# Patient Record
Sex: Female | Born: 1935 | Race: Black or African American | Hispanic: No | Smoking: Never smoker
Health system: Southern US, Community
[De-identification: ages and names within clinical notes are randomized; demographics above are authoritative.]

## PROBLEM LIST (undated history)

## (undated) DIAGNOSIS — Z803 Family history of malignant neoplasm of breast: Secondary | ICD-10-CM

## (undated) DIAGNOSIS — N183 Chronic kidney disease, stage 3 unspecified: Secondary | ICD-10-CM

## (undated) DIAGNOSIS — Z9221 Personal history of antineoplastic chemotherapy: Secondary | ICD-10-CM

## (undated) DIAGNOSIS — I129 Hypertensive chronic kidney disease with stage 1 through stage 4 chronic kidney disease, or unspecified chronic kidney disease: Secondary | ICD-10-CM

## (undated) DIAGNOSIS — I10 Essential (primary) hypertension: Secondary | ICD-10-CM

## (undated) DIAGNOSIS — F32A Depression, unspecified: Secondary | ICD-10-CM

## (undated) DIAGNOSIS — T451X5A Adverse effect of antineoplastic and immunosuppressive drugs, initial encounter: Secondary | ICD-10-CM

## (undated) DIAGNOSIS — E119 Type 2 diabetes mellitus without complications: Secondary | ICD-10-CM

## (undated) DIAGNOSIS — C50919 Malignant neoplasm of unspecified site of unspecified female breast: Secondary | ICD-10-CM

## (undated) DIAGNOSIS — T7840XA Allergy, unspecified, initial encounter: Secondary | ICD-10-CM

## (undated) DIAGNOSIS — C801 Malignant (primary) neoplasm, unspecified: Secondary | ICD-10-CM

## (undated) DIAGNOSIS — K219 Gastro-esophageal reflux disease without esophagitis: Secondary | ICD-10-CM

## (undated) DIAGNOSIS — M316 Other giant cell arteritis: Secondary | ICD-10-CM

## (undated) DIAGNOSIS — M1990 Unspecified osteoarthritis, unspecified site: Secondary | ICD-10-CM

## (undated) DIAGNOSIS — Z800 Family history of malignant neoplasm of digestive organs: Secondary | ICD-10-CM

## (undated) DIAGNOSIS — F419 Anxiety disorder, unspecified: Secondary | ICD-10-CM

## (undated) DIAGNOSIS — D6481 Anemia due to antineoplastic chemotherapy: Secondary | ICD-10-CM

## (undated) HISTORY — DX: Gastro-esophageal reflux disease without esophagitis: K21.9

## (undated) HISTORY — DX: Depression, unspecified: F32.A

## (undated) HISTORY — PX: ABDOMINAL HYSTERECTOMY: SHX81

## (undated) HISTORY — PX: BREAST SURGERY: SHX581

## (undated) HISTORY — DX: Chronic kidney disease, stage 3 unspecified: N18.30

## (undated) HISTORY — DX: Family history of malignant neoplasm of breast: Z80.3

## (undated) HISTORY — DX: Other giant cell arteritis: M31.6

## (undated) HISTORY — DX: Anxiety disorder, unspecified: F41.9

## (undated) HISTORY — DX: Type 2 diabetes mellitus without complications: E11.9

## (undated) HISTORY — DX: Hypertensive chronic kidney disease with stage 1 through stage 4 chronic kidney disease, or unspecified chronic kidney disease: I12.9

## (undated) HISTORY — DX: Family history of malignant neoplasm of digestive organs: Z80.0

## (undated) HISTORY — DX: Unspecified osteoarthritis, unspecified site: M19.90

## (undated) HISTORY — DX: Essential (primary) hypertension: I10

## (undated) HISTORY — DX: Allergy, unspecified, initial encounter: T78.40XA

---

## 1988-07-12 DIAGNOSIS — C50919 Malignant neoplasm of unspecified site of unspecified female breast: Secondary | ICD-10-CM

## 1988-07-12 DIAGNOSIS — C801 Malignant (primary) neoplasm, unspecified: Secondary | ICD-10-CM

## 1988-07-12 HISTORY — DX: Malignant neoplasm of unspecified site of unspecified female breast: C50.919

## 1988-07-12 HISTORY — DX: Malignant (primary) neoplasm, unspecified: C80.1

## 1988-07-12 HISTORY — PX: MASTECTOMY: SHX3

## 2004-06-08 ENCOUNTER — Ambulatory Visit: Payer: Self-pay | Admitting: Family Medicine

## 2005-08-20 ENCOUNTER — Ambulatory Visit: Payer: Self-pay

## 2011-05-28 ENCOUNTER — Ambulatory Visit: Payer: Self-pay | Admitting: Unknown Physician Specialty

## 2011-05-31 LAB — PATHOLOGY REPORT

## 2011-06-16 ENCOUNTER — Ambulatory Visit: Payer: Self-pay | Admitting: Surgery

## 2011-06-17 LAB — CEA: CEA: 25.6 ng/mL — ABNORMAL HIGH (ref 0.0–4.7)

## 2011-06-23 ENCOUNTER — Inpatient Hospital Stay: Payer: Self-pay | Admitting: Surgery

## 2011-06-24 LAB — PATHOLOGY REPORT

## 2011-07-02 ENCOUNTER — Ambulatory Visit: Payer: Self-pay | Admitting: Internal Medicine

## 2011-07-07 ENCOUNTER — Ambulatory Visit: Payer: Self-pay | Admitting: Internal Medicine

## 2011-07-08 LAB — CEA: CEA: 9.5 ng/mL — ABNORMAL HIGH (ref 0.0–4.7)

## 2011-07-13 ENCOUNTER — Ambulatory Visit: Payer: Self-pay | Admitting: Internal Medicine

## 2011-11-04 ENCOUNTER — Ambulatory Visit: Payer: Self-pay | Admitting: Oncology

## 2011-11-04 DIAGNOSIS — Z85038 Personal history of other malignant neoplasm of large intestine: Secondary | ICD-10-CM | POA: Diagnosis not present

## 2011-11-04 DIAGNOSIS — Z79899 Other long term (current) drug therapy: Secondary | ICD-10-CM | POA: Diagnosis not present

## 2011-11-04 DIAGNOSIS — Z7982 Long term (current) use of aspirin: Secondary | ICD-10-CM | POA: Diagnosis not present

## 2011-11-04 DIAGNOSIS — I10 Essential (primary) hypertension: Secondary | ICD-10-CM | POA: Diagnosis not present

## 2011-11-04 DIAGNOSIS — Z9049 Acquired absence of other specified parts of digestive tract: Secondary | ICD-10-CM | POA: Diagnosis not present

## 2011-11-04 LAB — COMPREHENSIVE METABOLIC PANEL
Albumin: 3.6 g/dL (ref 3.4–5.0)
Alkaline Phosphatase: 98 U/L (ref 50–136)
Anion Gap: 12 (ref 7–16)
BUN: 17 mg/dL (ref 7–18)
Bilirubin,Total: 0.2 mg/dL (ref 0.2–1.0)
Calcium, Total: 9.3 mg/dL (ref 8.5–10.1)
Chloride: 101 mmol/L (ref 98–107)
Co2: 29 mmol/L (ref 21–32)
Creatinine: 1.07 mg/dL (ref 0.60–1.30)
EGFR (African American): 58 — ABNORMAL LOW
EGFR (Non-African Amer.): 50 — ABNORMAL LOW
Glucose: 135 mg/dL — ABNORMAL HIGH (ref 65–99)
Osmolality: 287 (ref 275–301)
Potassium: 3.3 mmol/L — ABNORMAL LOW (ref 3.5–5.1)
SGOT(AST): 16 U/L (ref 15–37)
SGPT (ALT): 23 U/L
Sodium: 142 mmol/L (ref 136–145)
Total Protein: 7.5 g/dL (ref 6.4–8.2)

## 2011-11-04 LAB — CBC CANCER CENTER
Basophil #: 0 x10 3/mm (ref 0.0–0.1)
Basophil %: 0.6 %
Eosinophil #: 0.1 x10 3/mm (ref 0.0–0.7)
Eosinophil %: 2.3 %
HCT: 40.3 % (ref 35.0–47.0)
HGB: 12.5 g/dL (ref 12.0–16.0)
Lymphocyte #: 1.4 x10 3/mm (ref 1.0–3.6)
Lymphocyte %: 26.4 %
MCH: 25.8 pg — ABNORMAL LOW (ref 26.0–34.0)
MCHC: 30.9 g/dL — ABNORMAL LOW (ref 32.0–36.0)
MCV: 84 fL (ref 80–100)
Monocyte #: 0.4 x10 3/mm (ref 0.2–0.9)
Monocyte %: 8.6 %
Neutrophil #: 3.2 x10 3/mm (ref 1.4–6.5)
Neutrophil %: 62.1 %
Platelet: 207 x10 3/mm (ref 150–440)
RBC: 4.82 10*6/uL (ref 3.80–5.20)
RDW: 14.8 % — ABNORMAL HIGH (ref 11.5–14.5)
WBC: 5.2 x10 3/mm (ref 3.6–11.0)

## 2011-11-05 LAB — CEA: CEA: 1.6 ng/mL (ref 0.0–4.7)

## 2011-11-10 ENCOUNTER — Ambulatory Visit: Payer: Self-pay | Admitting: Oncology

## 2011-12-07 DIAGNOSIS — Z9010 Acquired absence of unspecified breast and nipple: Secondary | ICD-10-CM | POA: Diagnosis not present

## 2011-12-07 DIAGNOSIS — Z09 Encounter for follow-up examination after completed treatment for conditions other than malignant neoplasm: Secondary | ICD-10-CM | POA: Diagnosis not present

## 2011-12-07 DIAGNOSIS — Z853 Personal history of malignant neoplasm of breast: Secondary | ICD-10-CM | POA: Diagnosis not present

## 2012-01-07 DIAGNOSIS — Z85038 Personal history of other malignant neoplasm of large intestine: Secondary | ICD-10-CM | POA: Diagnosis not present

## 2012-03-06 ENCOUNTER — Ambulatory Visit: Payer: Self-pay | Admitting: Oncology

## 2012-03-06 DIAGNOSIS — Z9049 Acquired absence of other specified parts of digestive tract: Secondary | ICD-10-CM | POA: Diagnosis not present

## 2012-03-06 DIAGNOSIS — Z85038 Personal history of other malignant neoplasm of large intestine: Secondary | ICD-10-CM | POA: Diagnosis not present

## 2012-03-06 DIAGNOSIS — Z79899 Other long term (current) drug therapy: Secondary | ICD-10-CM | POA: Diagnosis not present

## 2012-03-06 DIAGNOSIS — I10 Essential (primary) hypertension: Secondary | ICD-10-CM | POA: Diagnosis not present

## 2012-03-06 DIAGNOSIS — Z7982 Long term (current) use of aspirin: Secondary | ICD-10-CM | POA: Diagnosis not present

## 2012-03-06 LAB — CBC CANCER CENTER
Basophil #: 0 x10 3/mm (ref 0.0–0.1)
Basophil %: 0.5 %
Eosinophil #: 0.2 x10 3/mm (ref 0.0–0.7)
Eosinophil %: 3.4 %
HCT: 40.1 % (ref 35.0–47.0)
HGB: 12.6 g/dL (ref 12.0–16.0)
Lymphocyte #: 1.4 x10 3/mm (ref 1.0–3.6)
Lymphocyte %: 22.1 %
MCH: 26.6 pg (ref 26.0–34.0)
MCHC: 31.4 g/dL — ABNORMAL LOW (ref 32.0–36.0)
MCV: 85 fL (ref 80–100)
Monocyte #: 0.7 x10 3/mm (ref 0.2–0.9)
Monocyte %: 10.6 %
Neutrophil #: 4 x10 3/mm (ref 1.4–6.5)
Neutrophil %: 63.4 %
Platelet: 233 x10 3/mm (ref 150–440)
RBC: 4.74 10*6/uL (ref 3.80–5.20)
RDW: 14.4 % (ref 11.5–14.5)
WBC: 6.3 x10 3/mm (ref 3.6–11.0)

## 2012-03-07 LAB — CEA: CEA: 1.7 ng/mL (ref 0.0–4.7)

## 2012-03-12 ENCOUNTER — Ambulatory Visit: Payer: Self-pay | Admitting: Oncology

## 2012-03-29 DIAGNOSIS — Z23 Encounter for immunization: Secondary | ICD-10-CM | POA: Diagnosis not present

## 2012-03-29 DIAGNOSIS — I10 Essential (primary) hypertension: Secondary | ICD-10-CM | POA: Diagnosis not present

## 2012-03-29 DIAGNOSIS — Z0000 Encounter for general adult medical examination without abnormal findings: Secondary | ICD-10-CM | POA: Diagnosis not present

## 2012-04-11 ENCOUNTER — Ambulatory Visit: Payer: Self-pay | Admitting: Unknown Physician Specialty

## 2012-04-11 DIAGNOSIS — Z803 Family history of malignant neoplasm of breast: Secondary | ICD-10-CM | POA: Diagnosis not present

## 2012-04-11 DIAGNOSIS — Z980 Intestinal bypass and anastomosis status: Secondary | ICD-10-CM | POA: Diagnosis not present

## 2012-04-11 DIAGNOSIS — Z801 Family history of malignant neoplasm of trachea, bronchus and lung: Secondary | ICD-10-CM | POA: Diagnosis not present

## 2012-04-11 DIAGNOSIS — I10 Essential (primary) hypertension: Secondary | ICD-10-CM | POA: Diagnosis not present

## 2012-04-11 DIAGNOSIS — E669 Obesity, unspecified: Secondary | ICD-10-CM | POA: Diagnosis not present

## 2012-04-11 DIAGNOSIS — Z6834 Body mass index (BMI) 34.0-34.9, adult: Secondary | ICD-10-CM | POA: Diagnosis not present

## 2012-04-11 DIAGNOSIS — Z09 Encounter for follow-up examination after completed treatment for conditions other than malignant neoplasm: Secondary | ICD-10-CM | POA: Diagnosis not present

## 2012-04-11 DIAGNOSIS — K648 Other hemorrhoids: Secondary | ICD-10-CM | POA: Diagnosis not present

## 2012-04-11 DIAGNOSIS — Z7982 Long term (current) use of aspirin: Secondary | ICD-10-CM | POA: Diagnosis not present

## 2012-04-11 DIAGNOSIS — D126 Benign neoplasm of colon, unspecified: Secondary | ICD-10-CM | POA: Diagnosis not present

## 2012-04-11 DIAGNOSIS — Z79899 Other long term (current) drug therapy: Secondary | ICD-10-CM | POA: Diagnosis not present

## 2012-04-11 DIAGNOSIS — Z85038 Personal history of other malignant neoplasm of large intestine: Secondary | ICD-10-CM | POA: Diagnosis not present

## 2012-04-11 DIAGNOSIS — Z853 Personal history of malignant neoplasm of breast: Secondary | ICD-10-CM | POA: Diagnosis not present

## 2012-04-11 DIAGNOSIS — Z9109 Other allergy status, other than to drugs and biological substances: Secondary | ICD-10-CM | POA: Diagnosis not present

## 2012-04-12 LAB — PATHOLOGY REPORT

## 2012-04-18 LAB — HM COLONOSCOPY

## 2012-06-14 DIAGNOSIS — H2510 Age-related nuclear cataract, unspecified eye: Secondary | ICD-10-CM | POA: Diagnosis not present

## 2012-07-11 ENCOUNTER — Ambulatory Visit: Payer: Self-pay | Admitting: Oncology

## 2012-07-11 DIAGNOSIS — Z90710 Acquired absence of both cervix and uterus: Secondary | ICD-10-CM | POA: Diagnosis not present

## 2012-07-11 DIAGNOSIS — I10 Essential (primary) hypertension: Secondary | ICD-10-CM | POA: Diagnosis not present

## 2012-07-11 DIAGNOSIS — Z85038 Personal history of other malignant neoplasm of large intestine: Secondary | ICD-10-CM | POA: Diagnosis not present

## 2012-07-11 DIAGNOSIS — Z9010 Acquired absence of unspecified breast and nipple: Secondary | ICD-10-CM | POA: Diagnosis not present

## 2012-07-11 DIAGNOSIS — Z7982 Long term (current) use of aspirin: Secondary | ICD-10-CM | POA: Diagnosis not present

## 2012-07-11 DIAGNOSIS — Z79899 Other long term (current) drug therapy: Secondary | ICD-10-CM | POA: Diagnosis not present

## 2012-07-11 LAB — CBC CANCER CENTER
Basophil #: 0 x10 3/mm (ref 0.0–0.1)
Basophil %: 0.6 %
Eosinophil #: 0.1 x10 3/mm (ref 0.0–0.7)
Eosinophil %: 2.2 %
HCT: 40.8 % (ref 35.0–47.0)
HGB: 13.1 g/dL (ref 12.0–16.0)
Lymphocyte #: 1.6 x10 3/mm (ref 1.0–3.6)
Lymphocyte %: 25.9 %
MCH: 27.1 pg (ref 26.0–34.0)
MCHC: 32.2 g/dL (ref 32.0–36.0)
MCV: 84 fL (ref 80–100)
Monocyte #: 0.4 x10 3/mm (ref 0.2–0.9)
Monocyte %: 7 %
Neutrophil #: 3.9 x10 3/mm (ref 1.4–6.5)
Neutrophil %: 64.3 %
Platelet: 202 x10 3/mm (ref 150–440)
RBC: 4.85 10*6/uL (ref 3.80–5.20)
RDW: 14.4 % (ref 11.5–14.5)
WBC: 6.1 x10 3/mm (ref 3.6–11.0)

## 2012-07-12 ENCOUNTER — Ambulatory Visit: Payer: Self-pay | Admitting: Oncology

## 2012-07-13 LAB — CEA: CEA: 1.7 ng/mL (ref 0.0–4.7)

## 2012-09-27 DIAGNOSIS — I10 Essential (primary) hypertension: Secondary | ICD-10-CM | POA: Diagnosis not present

## 2012-12-07 DIAGNOSIS — Z853 Personal history of malignant neoplasm of breast: Secondary | ICD-10-CM | POA: Diagnosis not present

## 2012-12-07 DIAGNOSIS — Z9010 Acquired absence of unspecified breast and nipple: Secondary | ICD-10-CM | POA: Diagnosis not present

## 2012-12-07 DIAGNOSIS — Z09 Encounter for follow-up examination after completed treatment for conditions other than malignant neoplasm: Secondary | ICD-10-CM | POA: Diagnosis not present

## 2013-01-04 ENCOUNTER — Ambulatory Visit: Payer: Self-pay | Admitting: Oncology

## 2013-01-04 DIAGNOSIS — Z85038 Personal history of other malignant neoplasm of large intestine: Secondary | ICD-10-CM | POA: Diagnosis not present

## 2013-01-04 DIAGNOSIS — I10 Essential (primary) hypertension: Secondary | ICD-10-CM | POA: Diagnosis not present

## 2013-01-04 DIAGNOSIS — Z9049 Acquired absence of other specified parts of digestive tract: Secondary | ICD-10-CM | POA: Diagnosis not present

## 2013-01-04 DIAGNOSIS — Z79899 Other long term (current) drug therapy: Secondary | ICD-10-CM | POA: Diagnosis not present

## 2013-01-08 DIAGNOSIS — I10 Essential (primary) hypertension: Secondary | ICD-10-CM | POA: Diagnosis not present

## 2013-01-08 DIAGNOSIS — Z9049 Acquired absence of other specified parts of digestive tract: Secondary | ICD-10-CM | POA: Diagnosis not present

## 2013-01-08 DIAGNOSIS — Z79899 Other long term (current) drug therapy: Secondary | ICD-10-CM | POA: Diagnosis not present

## 2013-01-08 DIAGNOSIS — Z85038 Personal history of other malignant neoplasm of large intestine: Secondary | ICD-10-CM | POA: Diagnosis not present

## 2013-01-08 LAB — CBC CANCER CENTER
Basophil #: 0 x10 3/mm (ref 0.0–0.1)
Basophil %: 0.5 %
Eosinophil #: 0.1 x10 3/mm (ref 0.0–0.7)
Eosinophil %: 2.2 %
HCT: 39.7 % (ref 35.0–47.0)
HGB: 13 g/dL (ref 12.0–16.0)
Lymphocyte #: 1.7 x10 3/mm (ref 1.0–3.6)
Lymphocyte %: 27.6 %
MCH: 27.5 pg (ref 26.0–34.0)
MCHC: 32.7 g/dL (ref 32.0–36.0)
MCV: 84 fL (ref 80–100)
Monocyte #: 0.6 x10 3/mm (ref 0.2–0.9)
Monocyte %: 9.5 %
Neutrophil #: 3.8 x10 3/mm (ref 1.4–6.5)
Neutrophil %: 60.2 %
Platelet: 213 x10 3/mm (ref 150–440)
RBC: 4.73 10*6/uL (ref 3.80–5.20)
RDW: 14.2 % (ref 11.5–14.5)
WBC: 6.3 x10 3/mm (ref 3.6–11.0)

## 2013-01-09 ENCOUNTER — Ambulatory Visit: Payer: Self-pay | Admitting: Oncology

## 2013-01-09 LAB — CEA: CEA: 1.8 ng/mL (ref 0.0–4.7)

## 2013-04-04 DIAGNOSIS — I10 Essential (primary) hypertension: Secondary | ICD-10-CM | POA: Diagnosis not present

## 2013-04-04 DIAGNOSIS — Z23 Encounter for immunization: Secondary | ICD-10-CM | POA: Diagnosis not present

## 2013-04-04 DIAGNOSIS — Z0000 Encounter for general adult medical examination without abnormal findings: Secondary | ICD-10-CM | POA: Diagnosis not present

## 2013-07-16 ENCOUNTER — Ambulatory Visit: Payer: Self-pay | Admitting: Oncology

## 2013-07-16 DIAGNOSIS — I10 Essential (primary) hypertension: Secondary | ICD-10-CM | POA: Diagnosis not present

## 2013-07-16 DIAGNOSIS — Z7982 Long term (current) use of aspirin: Secondary | ICD-10-CM | POA: Diagnosis not present

## 2013-07-16 DIAGNOSIS — Z85038 Personal history of other malignant neoplasm of large intestine: Secondary | ICD-10-CM | POA: Diagnosis not present

## 2013-07-16 DIAGNOSIS — Z79899 Other long term (current) drug therapy: Secondary | ICD-10-CM | POA: Diagnosis not present

## 2013-07-16 LAB — CBC CANCER CENTER
Basophil #: 0 x10 3/mm (ref 0.0–0.1)
Basophil %: 0.7 %
Eosinophil #: 0.2 x10 3/mm (ref 0.0–0.7)
Eosinophil %: 2.6 %
HCT: 41.6 % (ref 35.0–47.0)
HGB: 13 g/dL (ref 12.0–16.0)
Lymphocyte #: 1.5 x10 3/mm (ref 1.0–3.6)
Lymphocyte %: 25.2 %
MCH: 26.8 pg (ref 26.0–34.0)
MCHC: 31.4 g/dL — ABNORMAL LOW (ref 32.0–36.0)
MCV: 85 fL (ref 80–100)
Monocyte #: 0.5 x10 3/mm (ref 0.2–0.9)
Monocyte %: 9 %
Neutrophil #: 3.8 x10 3/mm (ref 1.4–6.5)
Neutrophil %: 62.5 %
Platelet: 212 x10 3/mm (ref 150–440)
RBC: 4.87 10*6/uL (ref 3.80–5.20)
RDW: 13.9 % (ref 11.5–14.5)
WBC: 6 x10 3/mm (ref 3.6–11.0)

## 2013-07-17 LAB — CEA: CEA: 1.7 ng/mL (ref 0.0–4.7)

## 2013-08-12 ENCOUNTER — Ambulatory Visit: Payer: Self-pay | Admitting: Oncology

## 2013-10-02 DIAGNOSIS — I10 Essential (primary) hypertension: Secondary | ICD-10-CM | POA: Diagnosis not present

## 2013-12-05 ENCOUNTER — Ambulatory Visit: Payer: Self-pay

## 2013-12-05 DIAGNOSIS — Z1231 Encounter for screening mammogram for malignant neoplasm of breast: Secondary | ICD-10-CM | POA: Diagnosis not present

## 2013-12-08 LAB — HM MAMMOGRAPHY

## 2014-02-01 DIAGNOSIS — H2510 Age-related nuclear cataract, unspecified eye: Secondary | ICD-10-CM | POA: Diagnosis not present

## 2014-02-04 ENCOUNTER — Ambulatory Visit: Payer: Self-pay | Admitting: Oncology

## 2014-02-04 DIAGNOSIS — I10 Essential (primary) hypertension: Secondary | ICD-10-CM | POA: Diagnosis not present

## 2014-02-04 DIAGNOSIS — Z7982 Long term (current) use of aspirin: Secondary | ICD-10-CM | POA: Diagnosis not present

## 2014-02-04 DIAGNOSIS — Z9010 Acquired absence of unspecified breast and nipple: Secondary | ICD-10-CM | POA: Diagnosis not present

## 2014-02-04 DIAGNOSIS — Z85038 Personal history of other malignant neoplasm of large intestine: Secondary | ICD-10-CM | POA: Diagnosis not present

## 2014-02-04 DIAGNOSIS — Z90710 Acquired absence of both cervix and uterus: Secondary | ICD-10-CM | POA: Diagnosis not present

## 2014-02-04 DIAGNOSIS — Z79899 Other long term (current) drug therapy: Secondary | ICD-10-CM | POA: Diagnosis not present

## 2014-02-04 LAB — CBC CANCER CENTER
Basophil #: 0 x10 3/mm (ref 0.0–0.1)
Basophil %: 0.5 %
Eosinophil #: 0.1 x10 3/mm (ref 0.0–0.7)
Eosinophil %: 2.3 %
HCT: 39.5 % (ref 35.0–47.0)
HGB: 12.5 g/dL (ref 12.0–16.0)
Lymphocyte #: 1.6 x10 3/mm (ref 1.0–3.6)
Lymphocyte %: 27.5 %
MCH: 27 pg (ref 26.0–34.0)
MCHC: 31.7 g/dL — ABNORMAL LOW (ref 32.0–36.0)
MCV: 85 fL (ref 80–100)
Monocyte #: 0.5 x10 3/mm (ref 0.2–0.9)
Monocyte %: 8.7 %
Neutrophil #: 3.5 x10 3/mm (ref 1.4–6.5)
Neutrophil %: 61 %
Platelet: 210 x10 3/mm (ref 150–440)
RBC: 4.64 10*6/uL (ref 3.80–5.20)
RDW: 14.2 % (ref 11.5–14.5)
WBC: 5.8 x10 3/mm (ref 3.6–11.0)

## 2014-02-05 LAB — CEA: CEA: 1.8 ng/mL (ref 0.0–4.7)

## 2014-02-09 ENCOUNTER — Ambulatory Visit: Payer: Self-pay | Admitting: Oncology

## 2014-04-10 DIAGNOSIS — Z23 Encounter for immunization: Secondary | ICD-10-CM | POA: Diagnosis not present

## 2014-04-10 DIAGNOSIS — N183 Chronic kidney disease, stage 3 unspecified: Secondary | ICD-10-CM | POA: Diagnosis not present

## 2014-04-10 DIAGNOSIS — I129 Hypertensive chronic kidney disease with stage 1 through stage 4 chronic kidney disease, or unspecified chronic kidney disease: Secondary | ICD-10-CM | POA: Diagnosis not present

## 2014-04-10 DIAGNOSIS — Z0000 Encounter for general adult medical examination without abnormal findings: Secondary | ICD-10-CM | POA: Diagnosis not present

## 2014-05-02 ENCOUNTER — Ambulatory Visit: Payer: Self-pay

## 2014-05-02 DIAGNOSIS — Z13820 Encounter for screening for osteoporosis: Secondary | ICD-10-CM | POA: Diagnosis not present

## 2014-05-02 DIAGNOSIS — N951 Menopausal and female climacteric states: Secondary | ICD-10-CM | POA: Diagnosis not present

## 2014-05-02 DIAGNOSIS — Z780 Asymptomatic menopausal state: Secondary | ICD-10-CM | POA: Diagnosis not present

## 2014-05-02 LAB — HM DEXA SCAN

## 2014-05-07 DIAGNOSIS — E876 Hypokalemia: Secondary | ICD-10-CM | POA: Diagnosis not present

## 2014-05-07 DIAGNOSIS — I129 Hypertensive chronic kidney disease with stage 1 through stage 4 chronic kidney disease, or unspecified chronic kidney disease: Secondary | ICD-10-CM | POA: Diagnosis not present

## 2014-07-31 DIAGNOSIS — H2512 Age-related nuclear cataract, left eye: Secondary | ICD-10-CM | POA: Diagnosis not present

## 2014-08-08 ENCOUNTER — Ambulatory Visit: Payer: Self-pay | Admitting: Oncology

## 2014-08-08 DIAGNOSIS — Z7982 Long term (current) use of aspirin: Secondary | ICD-10-CM | POA: Diagnosis not present

## 2014-08-08 DIAGNOSIS — Z9049 Acquired absence of other specified parts of digestive tract: Secondary | ICD-10-CM | POA: Diagnosis not present

## 2014-08-08 DIAGNOSIS — Z9010 Acquired absence of unspecified breast and nipple: Secondary | ICD-10-CM | POA: Diagnosis not present

## 2014-08-08 DIAGNOSIS — Z85038 Personal history of other malignant neoplasm of large intestine: Secondary | ICD-10-CM | POA: Diagnosis not present

## 2014-08-08 DIAGNOSIS — Z90710 Acquired absence of both cervix and uterus: Secondary | ICD-10-CM | POA: Diagnosis not present

## 2014-08-08 DIAGNOSIS — I10 Essential (primary) hypertension: Secondary | ICD-10-CM | POA: Diagnosis not present

## 2014-08-08 DIAGNOSIS — Z79899 Other long term (current) drug therapy: Secondary | ICD-10-CM | POA: Diagnosis not present

## 2014-08-08 LAB — CBC CANCER CENTER
Basophil #: 0 x10 3/mm (ref 0.0–0.1)
Basophil %: 0.5 %
Eosinophil #: 0.1 x10 3/mm (ref 0.0–0.7)
Eosinophil %: 1.7 %
HCT: 42 % (ref 35.0–47.0)
HGB: 13.3 g/dL (ref 12.0–16.0)
Lymphocyte #: 1.4 x10 3/mm (ref 1.0–3.6)
Lymphocyte %: 25.4 %
MCH: 26.6 pg (ref 26.0–34.0)
MCHC: 31.5 g/dL — ABNORMAL LOW (ref 32.0–36.0)
MCV: 84 fL (ref 80–100)
Monocyte #: 0.5 x10 3/mm (ref 0.2–0.9)
Monocyte %: 8.1 %
Neutrophil #: 3.6 x10 3/mm (ref 1.4–6.5)
Neutrophil %: 64.3 %
Platelet: 207 x10 3/mm (ref 150–440)
RBC: 4.99 10*6/uL (ref 3.80–5.20)
RDW: 14.2 % (ref 11.5–14.5)
WBC: 5.6 x10 3/mm (ref 3.6–11.0)

## 2014-08-09 LAB — CEA: CEA: 1.7 ng/mL (ref 0.0–4.7)

## 2014-08-12 ENCOUNTER — Ambulatory Visit: Payer: Self-pay | Admitting: Oncology

## 2014-08-15 ENCOUNTER — Ambulatory Visit: Payer: Self-pay | Admitting: Ophthalmology

## 2014-08-15 DIAGNOSIS — H2512 Age-related nuclear cataract, left eye: Secondary | ICD-10-CM | POA: Diagnosis not present

## 2014-08-15 DIAGNOSIS — H269 Unspecified cataract: Secondary | ICD-10-CM | POA: Diagnosis not present

## 2014-08-15 DIAGNOSIS — Z01810 Encounter for preprocedural cardiovascular examination: Secondary | ICD-10-CM | POA: Diagnosis not present

## 2014-08-20 ENCOUNTER — Ambulatory Visit: Payer: Self-pay | Admitting: Ophthalmology

## 2014-08-20 DIAGNOSIS — H269 Unspecified cataract: Secondary | ICD-10-CM | POA: Diagnosis not present

## 2014-08-20 DIAGNOSIS — E669 Obesity, unspecified: Secondary | ICD-10-CM | POA: Diagnosis not present

## 2014-08-20 DIAGNOSIS — I10 Essential (primary) hypertension: Secondary | ICD-10-CM | POA: Diagnosis not present

## 2014-08-20 DIAGNOSIS — H2512 Age-related nuclear cataract, left eye: Secondary | ICD-10-CM | POA: Diagnosis not present

## 2014-09-04 DIAGNOSIS — H2511 Age-related nuclear cataract, right eye: Secondary | ICD-10-CM | POA: Diagnosis not present

## 2014-09-10 ENCOUNTER — Ambulatory Visit: Payer: Self-pay | Admitting: Ophthalmology

## 2014-09-10 DIAGNOSIS — Z85038 Personal history of other malignant neoplasm of large intestine: Secondary | ICD-10-CM | POA: Diagnosis not present

## 2014-09-10 DIAGNOSIS — I10 Essential (primary) hypertension: Secondary | ICD-10-CM | POA: Diagnosis not present

## 2014-09-10 DIAGNOSIS — H2511 Age-related nuclear cataract, right eye: Secondary | ICD-10-CM | POA: Diagnosis not present

## 2014-09-10 DIAGNOSIS — Z7982 Long term (current) use of aspirin: Secondary | ICD-10-CM | POA: Diagnosis not present

## 2014-09-10 DIAGNOSIS — H269 Unspecified cataract: Secondary | ICD-10-CM | POA: Diagnosis not present

## 2014-09-10 DIAGNOSIS — Z853 Personal history of malignant neoplasm of breast: Secondary | ICD-10-CM | POA: Diagnosis not present

## 2014-10-09 DIAGNOSIS — I129 Hypertensive chronic kidney disease with stage 1 through stage 4 chronic kidney disease, or unspecified chronic kidney disease: Secondary | ICD-10-CM | POA: Diagnosis not present

## 2014-10-09 DIAGNOSIS — N183 Chronic kidney disease, stage 3 (moderate): Secondary | ICD-10-CM | POA: Diagnosis not present

## 2014-11-03 NOTE — Discharge Summary (Signed)
PATIENT NAME:  Veronica Colon, Veronica Colon MR#:  [id number] DATE OF BIRTH:  [birth year]  DATE OF ADMISSION:  06/23/2011 DATE OF DISCHARGE:  06/27/2011  ADMITTING PHYSICIAN: Rochel Brome, MD  ADMISSION DIAGNOSES: Carcinoma of the ascending colon and adenoma of the cecum.   DISCHARGE DIAGNOSES: Carcinoma of the ascending colon and adenoma of the cecum.   HISTORY OF PRESENT ILLNESS: 81 year old female with findings of a tubular adenoma of the cecum and carcinoma of the ascending colon found on a screening colonoscopy recently. Surgery was recommended for definitive treatment.   HOSPITAL COURSE: The patient underwent laparoscopic right colectomy on 12/12. She did well with the surgery and was admitted to the floor after recovery. She used very little IV pain medication. She was able to transition to oral pain medication by postoperative day number two. She was able to start clear liquids on postoperative day number one and gradually advanced her diet. On postoperative day number four she had had multiple bowel movements and was passing gas. She was ambulating well. Dr. Tamala Julian examined the patient. Her abdomen was soft and the incision was dry and intact. Discharge plan was discussed with the patient.   DISPOSITION: Discharged home with self-care in satisfactory condition.   MEDICATIONS:  1. Hydrochlorothiazide 25 mg daily.  2. Amlodipine 2.5 mg daily.  3. Aspirin 81 mg daily.  4. Over-the-counter allergy pills daily. 5. Klor-Con milliequivalents daily.  6. Tylenol as needed for pain.   DISCHARGE INSTRUCTIONS:  1. No dressings needed.  2. May shower.  3. Regular diet.  4. No heavy lifting. 5. Follow-up is scheduled later in the week for suture removal.   ____________________________ Celene Squibb. Theda Sers, [place] amc:rbg D: 07/14/2011 12:57:02 ET T: 07/15/2011 13:46:26 ET JOB#: [id number]  cc: Celene Squibb. Theda Sers, [place], <Dictator> ANN M COLLINS PA ELECTRONICALLY SIGNED 07/15/2011 15:22

## 2014-11-03 NOTE — Op Note (Signed)
PATIENT NAME:  Veronica Colon, Veronica Colon MR#:  [id number] DATE OF BIRTH:  21-Apr-1932  DATE OF PROCEDURE:  06/23/2011  PREOPERATIVE DIAGNOSES:  1. Carcinoma of the ascending colon.  2. Adenoma of the cecum.   POSTOPERATIVE DIAGNOSES: 1. Carcinoma of the ascending colon.  2. Adenoma of the cecum.   PROCEDURE: Laparoscopic right colectomy.   SURGEON: Loreli Dollar, MD   ASSISTANT: Britta Mccreedy, PA    ANESTHESIA: General.   INDICATIONS: This 81 year old female recently had a screening colonoscopy with findings of a sessile tubular adenoma of the cecum and a 2.5 cm mass of the ascending colon which biopsy demonstrated invasive adenocarcinoma. Surgery was recommended for definitive treatment.   DESCRIPTION OF PROCEDURE: The patient was placed on the operating table in the supine position under general anesthesia. The abdomen was prepared with ChloraPrep and draped in a sterile manner.   A short incision was made below the umbilicus and carried down through the subcutaneous tissues to the deep fascia which was grasped with laryngeal hook and elevated. A Veress needle was inserted, aspirated, and irrigated with a saline solution. Next, the peritoneal cavity was inflated with carbon dioxide. The Veress needle was removed. The 10 mm cannula was inserted. The 10 mm 0 degree laparoscope was inserted to view the peritoneal cavity. Another incision was made in the epigastrium just about two inches above the umbilicus to insert another 10 mm port. Another incision was made in the right mid abdomen to introduce another 10 mm port.   The liver appeared normal. There were a number of adhesions between the omentum and the anterior abdominal wall below the umbilicus. The site of the right colon was identified. Next, a number of adhesions were taken down between the omentum and the anterior abdominal wall using the Harmonic scalpel. Next, the omentum was maneuvered up and reflected in a cephalad direction and laid over the  stomach exposing the transverse colon. Next, Babcock clamp was used to grasp the cecum, further identified the appendix and the terminal ileum. There appeared to be a mild prominence of creeping fat along the terminal ileum but there appeared to be no thickening of the wall of the terminal ileum. The right colon was mobilized with incision of the lateral peritoneal reflection using the Harmonic scalpel and also the appendix and terminal ileum were mobilized in a similar manner. The mobilization was carried up to the hepatic flexure. Next, dissecting along the transverse colon the omentum was dissected away from the transverse colon and extending from the mid aspect of the transverse colon towards the right and with additional dissection the right transverse colon was mobilized. The duodenum was identified as the transverse mesocolon was dissected away from the duodenum and the dissection alternated between the right colon and the transverse colon until it was well mobilized and after that the laparoscopic instruments were removed. An incision was made from the supraumbilical port to the infraumbilical port and made just long enough to bring the colon out on the abdominal wall. Next, a window was created in the mesentery of the terminal ileum some four inches proximal to the ileocecal valve. Also, a window was created in the transverse mesocolon just to the right of the middle colic vessels. I also palpated the cancer which was about 2.5 cm in the ascending colon. There was no palpable adenopathy. Next, the mesenteric dissection was carried out beginning with the Harmonic scalpel. The ileocolic artery and vein were doubly ligated with 0 chromic suture ligature and  then divided distal to that with the Harmonic scalpel. After completing the mesenteric dissection, the terminal ileum was placed beside the transverse colon. An enterotomy and a colotomy were made. The 75 mm GIA stapler was introduced and began the  anastomosis along the antimesenteric border as it was engaged and activated. The staple line appeared to be hemostatic. Next, the anastomosis was completed with application of VB-16 stapler which was placed perpendicular to the first, engaged, activated, and the specimen was incised sharply with a scalpel. Staple line was inspected. Two small bleeding points were cauterized. The anastomosis looked good and was widely patent. Next, the mesenteric defect was closed with running 3-0 chromic.  The specimen was opened on the side table identifying the cancer which was some 2.5 cm in dimension in the ascending colon and also identified a sessile polyp in the cecum in close proximity to the ileocecal valve. The specimen was submitted in formalin for routine pathology. Gloves were changed. Next, the operative area was examined. Pulled suction was used. Just a small amount of blood was aspirated along the right colic gutter and hemostasis appeared to be intact. Next, the omentum was brought down beneath the wound and spread out over the small intestines. Next, the midline fascia was closed with interrupted 0 Maxon figure-of-eight sutures and the skin incisions were closed with interrupted 4-0 nylon vertical mattress sutures. Dressings were applied with paper tape.   The patient tolerated surgery satisfactorily and was then prepared for transfer to the recovery room.   ____________________________ Lenna Sciara. Rochel Brome, MD jws:drc D: 06/23/2011 10:51:32 ET T: 06/23/2011 11:15:14 ET JOB#: [id number]  cc: Loreli Dollar, MD, <Dictator> Loreli Dollar MD ELECTRONICALLY SIGNED 07/17/2011 17:35

## 2014-11-10 NOTE — Op Note (Signed)
PATIENT NAME:  Veronica Colon, Veronica Colon MR#:  [id number] DATE OF BIRTH:  1932/05/15  DATE OF PROCEDURE:  08/20/2014   PREOPERATIVE DIAGNOSIS:  Nuclear sclerotic cataract of the left eye.   POSTOPERATIVE DIAGNOSIS:  Nuclear sclerotic cataract of the left eye.   OPERATIVE PROCEDURE:  Cataract extraction by phacoemulsification with implant of intraocular lens to left eye.   SURGEON:  Birder Robson, MD.   ANESTHESIA:  1. Managed anesthesia care.  2. Topical tetracaine drops followed by 2% Xylocaine jelly applied in the preoperative holding area.   COMPLICATIONS:  None.   TECHNIQUE:   Stop and chop.  DESCRIPTION OF PROCEDURE:  The patient was examined and consented in the preoperative holding area where the aforementioned topical anesthesia was applied to the left eye and then brought back to the Operating Room where the left eye was prepped and draped in the usual sterile ophthalmic fashion and a lid speculum was placed. A paracentesis was created with the side port blade and the anterior chamber was filled with viscoelastic. A near clear corneal incision was performed with the steel keratome. A continuous curvilinear capsulorrhexis was performed with a cystotome followed by the capsulorrhexis forceps. Hydrodissection and hydrodelineation were carried out with BSS on a blunt cannula. The lens was removed in a stop and chop technique and the remaining cortical material was removed with the irrigation-aspiration handpiece. The capsular bag was inflated with viscoelastic and the Tecnis ZCB00, 21.0-diopter lens, serial number [id number] was placed in the capsular bag without complication. The remaining viscoelastic was removed from the eye with the irrigation-aspiration handpiece. The wounds were hydrated. The anterior chamber was flushed with Miostat and the eye was inflated to physiologic pressure. 0.1 mL of cefuroxime concentration 10 mg/mL was placed in the anterior chamber. The wounds were found to be water  tight. The eye was dressed with Vigamox. The patient was given protective glasses to wear throughout the day and a shield with which to sleep tonight. The patient was also given drops with which to begin a drop regimen today and will follow-up with me in one day.    ____________________________ Livingston Diones. Levander Katzenstein, MD wlp:TM D: 08/20/2014 21:15:52 ET T: 08/20/2014 22:57:55 ET JOB#: [id number]  cc: Larkin Morelos L. Donte Lenzo, MD, <Dictator> Livingston Diones Noe Pittsley MD ELECTRONICALLY SIGNED 08/21/2014 12:12

## 2014-11-10 NOTE — Op Note (Signed)
PATIENT NAME:  Veronica Colon, Veronica Colon MR#:  [id number] DATE OF BIRTH:  May 08, 1932  DATE OF PROCEDURE:  09/10/2014  LOCATION: [facility].   TECHNIQUE: Stop and chop.   PREOPERATIVE DIAGNOSIS:  Nuclear sclerotic cataract of the right eye.   POSTOPERATIVE DIAGNOSIS:  Nuclear sclerotic cataract of the right eye.   OPERATIVE PROCEDURE:  Cataract extraction by phacoemulsification with implant of intraocular lens to right eye.   SURGEON:  Birder Robson, MD.   ANESTHESIA:  1. Managed anesthesia care.  2. Topical tetracaine drops followed by 2% Xylocaine jelly applied in the preoperative holding area.   COMPLICATIONS:  None.   DESCRIPTION OF PROCEDURE:  The patient was examined and consented in the preoperative holding area where the aforementioned topical anesthesia was applied to the right  eye and then brought back to the Operating Room where the right eye was prepped and draped in the usual sterile ophthalmic fashion and a lid speculum was placed. A paracentesis was created with the side port blade and the anterior chamber was filled with viscoelastic. A near clear corneal incision was performed with the steel keratome. A continuous curvilinear capsulorrhexis was performed with a cystotome followed by the capsulorrhexis forceps. Hydrodissection and hydrodelineation were carried out with BSS on a blunt cannula. The lens was removed in a stop and chop technique and the remaining cortical material was removed with the irrigation-aspiration handpiece. The capsular bag was inflated with viscoelastic and the Tecnis ZCB00 diopter lens 21.0-diopter lens, serial number [id number] was placed in the capsular bag without complication. The remaining viscoelastic was removed from the eye with the irrigation-aspiration handpiece. The wounds were hydrated. The anterior chamber was flushed with Miostat and the eye was inflated to physiologic pressure. 0.1 mL of cefuroxime concentration 10 mg/mL was  placed in the anterior chamber. The wounds were found to be water tight. The eye was dressed with Vigamox. The patient was given protective glasses to wear throughout the day and a shield with which to sleep tonight. The patient was also given drops with which to begin a drop regimen today and will follow-up with me in 1 day.    ____________________________ Livingston Diones. Asyia Hornung, MD wlp:mc D: 09/10/2014 21:13:48 ET T: 09/11/2014 08:42:54 ET JOB#: [id number]  cc: Nilan Iddings L. Lovette Merta, MD, <Dictator> Livingston Diones Magalie Almon MD ELECTRONICALLY SIGNED 09/11/2014 11:26

## 2014-12-16 DIAGNOSIS — M47816 Spondylosis without myelopathy or radiculopathy, lumbar region: Secondary | ICD-10-CM | POA: Diagnosis not present

## 2014-12-16 DIAGNOSIS — M545 Low back pain: Secondary | ICD-10-CM | POA: Diagnosis not present

## 2014-12-16 DIAGNOSIS — M4316 Spondylolisthesis, lumbar region: Secondary | ICD-10-CM | POA: Diagnosis not present

## 2014-12-23 ENCOUNTER — Other Ambulatory Visit: Payer: Self-pay | Admitting: Unknown Physician Specialty

## 2014-12-23 DIAGNOSIS — Z1231 Encounter for screening mammogram for malignant neoplasm of breast: Secondary | ICD-10-CM

## 2014-12-26 ENCOUNTER — Ambulatory Visit
Admission: RE | Admit: 2014-12-26 | Discharge: 2014-12-26 | Disposition: A | Discharge status: Home / Self Care | Payer: Medicare Other | Source: Ambulatory Visit | Attending: Unknown Physician Specialty | Admitting: Unknown Physician Specialty

## 2014-12-26 ENCOUNTER — Other Ambulatory Visit: Payer: Self-pay | Admitting: Unknown Physician Specialty

## 2014-12-26 DIAGNOSIS — Z9012 Acquired absence of left breast and nipple: Secondary | ICD-10-CM | POA: Insufficient documentation

## 2014-12-26 DIAGNOSIS — Z1231 Encounter for screening mammogram for malignant neoplasm of breast: Secondary | ICD-10-CM | POA: Diagnosis not present

## 2014-12-26 HISTORY — DX: Malignant (primary) neoplasm, unspecified: C80.1

## 2014-12-26 HISTORY — DX: Personal history of antineoplastic chemotherapy: Z92.21

## 2015-02-14 DIAGNOSIS — J069 Acute upper respiratory infection, unspecified: Secondary | ICD-10-CM | POA: Diagnosis not present

## 2015-02-26 ENCOUNTER — Ambulatory Visit (INDEPENDENT_AMBULATORY_CARE_PROVIDER_SITE_OTHER): Payer: Medicare Other | Admitting: Unknown Physician Specialty

## 2015-02-26 ENCOUNTER — Encounter: Payer: Self-pay | Admitting: Unknown Physician Specialty

## 2015-02-26 VITALS — BP 134/75 | HR 66 | Temp 97.9°F | Ht 64.2 in | Wt 208.4 lb

## 2015-02-26 DIAGNOSIS — N183 Chronic kidney disease, stage 3 unspecified: Secondary | ICD-10-CM

## 2015-02-26 DIAGNOSIS — R609 Edema, unspecified: Secondary | ICD-10-CM

## 2015-02-26 DIAGNOSIS — E876 Hypokalemia: Secondary | ICD-10-CM

## 2015-02-26 DIAGNOSIS — I129 Hypertensive chronic kidney disease with stage 1 through stage 4 chronic kidney disease, or unspecified chronic kidney disease: Secondary | ICD-10-CM | POA: Insufficient documentation

## 2015-02-26 DIAGNOSIS — M25473 Effusion, unspecified ankle: Secondary | ICD-10-CM

## 2015-02-26 DIAGNOSIS — Z853 Personal history of malignant neoplasm of breast: Secondary | ICD-10-CM | POA: Insufficient documentation

## 2015-02-26 DIAGNOSIS — Z85038 Personal history of other malignant neoplasm of large intestine: Secondary | ICD-10-CM | POA: Insufficient documentation

## 2015-02-26 DIAGNOSIS — K219 Gastro-esophageal reflux disease without esophagitis: Secondary | ICD-10-CM | POA: Insufficient documentation

## 2015-02-26 DIAGNOSIS — J309 Allergic rhinitis, unspecified: Secondary | ICD-10-CM | POA: Insufficient documentation

## 2015-02-26 NOTE — Progress Notes (Signed)
   BP 134/75 mmHg  Pulse 66  Temp(Src) 97.9 F (36.6 C)  Ht 5' 4.2" (1.631 m)  Wt 208 lb 6.4 oz (94.53 kg)  BMI 35.54 kg/m2  SpO2 96%  LMP  (LMP Unknown)   Subjective:    Patient ID: Veronica Colon, female    DOB: 1931-11-24, 81 y.o.   MRN: [id number]  HPI: Veronica Colon is a 81 y.o. female  Chief Complaint  Patient presents with  . Edema    pt states she has swelling in both ankles and has for a few weeks now   Pt nis here with complaints of bilateral foot and ankle swelling.  It is resolved when she wakes up in the morning.  Of not, she is allergic to Lisinopril and I needed to stop HCTZ due to hypokalemia.  This has mostly been a problem with hot weather.  No SOB or chest pain.  Does state she gets hot flashes that make her feel "like I'm going to pass out."  This happens at rest as well.    Relevant past medical, surgical, family and social history reviewed and updated as indicated. Interim medical history since our last visit reviewed. Allergies and medications reviewed and updated.  Review of Systems  Per HPI unless specifically indicated above     Objective:    BP 134/75 mmHg  Pulse 66  Temp(Src) 97.9 F (36.6 C)  Ht 5' 4.2" (1.631 m)  Wt 208 lb 6.4 oz (94.53 kg)  BMI 35.54 kg/m2  SpO2 96%  LMP  (LMP Unknown)  Wt Readings from Last 3 Encounters:  02/26/15 208 lb 6.4 oz (94.53 kg)  10/09/14 211 lb (95.709 kg)    Physical Exam  Constitutional: She is oriented to person, place, and time. She appears well-developed and well-nourished. No distress.  HENT:  Head: Normocephalic and atraumatic.  Eyes: Conjunctivae and lids are normal. Right eye exhibits no discharge. Left eye exhibits no discharge. No scleral icterus.  Cardiovascular: Normal rate, regular rhythm and normal heart sounds.   Pulmonary/Chest: Effort normal and breath sounds normal. No respiratory distress.  Abdominal: Normal appearance and bowel sounds are normal. She exhibits no distension. There is no  splenomegaly or hepatomegaly. There is no tenderness.  Musculoskeletal: Normal range of motion.  Trace foot and ankle edema  Neurological: She is alert and oriented to person, place, and time.  Skin: Skin is intact. No rash noted. No pallor.  Psychiatric: She has a normal mood and affect. Her behavior is normal. Judgment and thought content normal.    Results for orders placed or performed in visit on 02/26/15  HM MAMMOGRAPHY  Result Value Ref Range   HM Mammogram from PP   HM DEXA SCAN  Result Value Ref Range   HM Dexa Scan from PP   HM COLONOSCOPY  Result Value Ref Range   HM Colonoscopy from PP       Assessment & Plan:   Problem List Items Addressed This Visit    None    Visit Diagnoses    Ankle edema    -  Primary    Discussed side-effect of Amlodipine.  Hypokalemia with HCTZ and allergic to Lisinopril.  Discussed options and she elected to continue Amlodipine.          Follow up plan: No Follow-up on file.

## 2015-03-24 ENCOUNTER — Other Ambulatory Visit: Payer: Self-pay

## 2015-03-24 MED ORDER — FLUTICASONE PROPIONATE 50 MCG/ACT NA SUSP: 2.0000 | Freq: Every day | NASAL | Status: DC

## 2015-03-24 NOTE — Telephone Encounter (Signed)
Patient was last seen 02/26/15 and has follow-up on 04/14/15. Pharmacy is [facility] in [place].

## 2015-04-14 ENCOUNTER — Encounter: Payer: Self-pay | Admitting: Unknown Physician Specialty

## 2015-04-14 ENCOUNTER — Ambulatory Visit (INDEPENDENT_AMBULATORY_CARE_PROVIDER_SITE_OTHER): Payer: Medicare Other | Admitting: Unknown Physician Specialty

## 2015-04-14 VITALS — BP 147/72 | HR 60 | Temp 97.8°F | Ht 63.0 in | Wt 205.2 lb

## 2015-04-14 DIAGNOSIS — Z23 Encounter for immunization: Secondary | ICD-10-CM | POA: Diagnosis not present

## 2015-04-14 DIAGNOSIS — N183 Chronic kidney disease, stage 3 unspecified: Secondary | ICD-10-CM

## 2015-04-14 DIAGNOSIS — Z0000 Encounter for general adult medical examination without abnormal findings: Secondary | ICD-10-CM

## 2015-04-14 DIAGNOSIS — I129 Hypertensive chronic kidney disease with stage 1 through stage 4 chronic kidney disease, or unspecified chronic kidney disease: Secondary | ICD-10-CM | POA: Diagnosis not present

## 2015-04-14 DIAGNOSIS — D369 Benign neoplasm, unspecified site: Secondary | ICD-10-CM

## 2015-04-14 DIAGNOSIS — I10 Essential (primary) hypertension: Secondary | ICD-10-CM | POA: Diagnosis not present

## 2015-04-14 MED ORDER — AMLODIPINE BESYLATE 5 MG PO TABS: 5.0000 mg | ORAL_TABLET | Freq: Every day | ORAL | Status: DC

## 2015-04-14 NOTE — Assessment & Plan Note (Signed)
Stable, continue present medications.  Check CMP

## 2015-04-14 NOTE — Progress Notes (Addendum)
BP 147/72 mmHg  Pulse 60  Temp(Src) 97.8 F (36.6 C)  Ht 5\' 3"  (1.6 m)  Wt 205 lb 3.2 oz (93.078 kg)  BMI 36.36 kg/m2  SpO2 97%  LMP  (LMP Unknown)   Subjective:    Patient ID: Veronica Colon, female    DOB: 07/24/1931, 81 y.o.   MRN: [id number]  HPI: Veronica Colon is a 81 y.o. female  Chief Complaint  Patient presents with  . Medicare Wellness   Depression screen [facility] 2/9 04/14/2015  Decreased Interest 0  Down, Depressed, Hopeless 0  PHQ - 2 Score 0    Fall Risk  04/14/2015  Falls in the past year? No   Functional Status Survey: Is the patient deaf or have difficulty hearing?: No Does the patient have difficulty seeing, even when wearing glasses/contacts?: No Does the patient have difficulty concentrating, remembering, or making decisions?: No Does the patient have difficulty walking or climbing stairs?: No Does the patient have difficulty dressing or bathing?: No Does the patient have difficulty doing errands alone such as visiting a doctor's office or shopping?: No  Relevant past medical, surgical, family and social history reviewed and updated as indicated. Interim medical history since our last visit reviewed. Allergies and medications reviewed and updated.  See functional status, depression screen, and fall's risk assessment  under the appropriate section.    Pt is able to perform complex mental tasks, recognize clock face, recognize time and do a 3 item recall.    Sees eye professional yearly  Hypertension This is a chronic problem. The current episode started today. The problem is unchanged. The problem is controlled. Pertinent negatives include no anxiety, blurred vision, chest pain, headaches, malaise/fatigue, neck pain, orthopnea, palpitations, peripheral edema, PND, shortness of breath or sweats. There are no compliance problems.      Relevant past medical, surgical, family and social history reviewed and updated as indicated. Interim medical history since  our last visit reviewed. Allergies and medications reviewed and updated.  Review of Systems  Constitutional: Negative for malaise/fatigue.  Eyes: Negative for blurred vision.  Respiratory: Negative for shortness of breath.   Cardiovascular: Negative for chest pain, palpitations, orthopnea and PND.  Musculoskeletal: Negative for neck pain.  Neurological: Negative for headaches.    Per HPI unless specifically indicated above     Objective:    BP 147/72 mmHg  Pulse 60  Temp(Src) 97.8 F (36.6 C)  Ht 5\' 3"  (1.6 m)  Wt 205 lb 3.2 oz (93.078 kg)  BMI 36.36 kg/m2  SpO2 97%  LMP  (LMP Unknown)  Wt Readings from Last 3 Encounters:  04/14/15 205 lb 3.2 oz (93.078 kg)  02/26/15 208 lb 6.4 oz (94.53 kg)  10/09/14 211 lb (95.709 kg)    Physical Exam  Constitutional: She is oriented to person, place, and time. She appears well-developed and well-nourished. No distress.  HENT:  Head: Normocephalic and atraumatic.  Eyes: Conjunctivae and lids are normal. Right eye exhibits no discharge. Left eye exhibits no discharge. No scleral icterus.  Neck: Normal range of motion. Neck supple.  Cardiovascular: Normal rate, regular rhythm and normal heart sounds.   Pulmonary/Chest: Effort normal and breath sounds normal. No respiratory distress.  Abdominal: Normal appearance. There is no splenomegaly or hepatomegaly.  Musculoskeletal: Normal range of motion.  Neurological: She is alert and oriented to person, place, and time.  Skin: Skin is warm, dry and intact. No rash noted. No pallor.  Psychiatric: She has a normal mood and  affect. Her behavior is normal. Judgment and thought content normal.  Nursing note and vitals reviewed.      Assessment & Plan:   Problem List Items Addressed This Visit      Unprioritized   Hypertensive CKD (chronic kidney disease)    Stable, continue present medications.  Check CMP      CKD (chronic kidney disease), stage III    Other Visit Diagnoses     Immunization due    -  Primary    Relevant Orders    Flu Vaccine QUAD 36+ mos PF IM (Fluarix & Fluzone Quad PF) (Completed)    Tdap vaccine greater than or equal to 7yo IM (Completed)    Adenomatous polyps        Relevant Orders    Ambulatory referral to Gastroenterology    Essential hypertension        Relevant Medications    amLODipine (NORVASC) 5 MG tablet    Other Relevant Orders    Comprehensive metabolic panel    Lipid Panel w/o Chol/HDL Ratio    Annual physical exam            Follow up plan: No Follow-up on file.

## 2015-04-15 ENCOUNTER — Encounter: Payer: Self-pay | Admitting: Unknown Physician Specialty

## 2015-04-15 LAB — COMPREHENSIVE METABOLIC PANEL
ALT: 15 IU/L (ref 0–32)
AST: 21 IU/L (ref 0–40)
Albumin/Globulin Ratio: 1.6 (ref 1.1–2.5)
Albumin: 4.1 g/dL (ref 3.5–4.7)
Alkaline Phosphatase: 96 IU/L (ref 39–117)
BUN/Creatinine Ratio: 18 (ref 11–26)
BUN: 16 mg/dL (ref 8–27)
Bilirubin Total: 0.4 mg/dL (ref 0.0–1.2)
CO2: 25 mmol/L (ref 18–29)
Calcium: 9.7 mg/dL (ref 8.7–10.3)
Chloride: 103 mmol/L (ref 97–108)
Creatinine, Ser: 0.87 mg/dL (ref 0.57–1.00)
GFR calc Af Amer: 72 mL/min/{1.73_m2} (ref >59)
GFR calc non Af Amer: 63 mL/min/{1.73_m2} (ref >59)
Globulin, Total: 2.6 g/dL (ref 1.5–4.5)
Glucose: 81 mg/dL (ref 65–99)
Potassium: 4.3 mmol/L (ref 3.5–5.2)
Sodium: 144 mmol/L (ref 134–144)
Total Protein: 6.7 g/dL (ref 6.0–8.5)

## 2015-04-15 LAB — LIPID PANEL W/O CHOL/HDL RATIO
Cholesterol, Total: 219 mg/dL — ABNORMAL HIGH (ref 100–199)
HDL: 74 mg/dL (ref >39)
LDL Calculated: 109 mg/dL — ABNORMAL HIGH (ref 0–99)
Triglycerides: 178 mg/dL — ABNORMAL HIGH (ref 0–149)
VLDL Cholesterol Cal: 36 mg/dL (ref 5–40)

## 2015-04-21 DIAGNOSIS — H43813 Vitreous degeneration, bilateral: Secondary | ICD-10-CM | POA: Diagnosis not present

## 2015-05-08 DIAGNOSIS — Z85038 Personal history of other malignant neoplasm of large intestine: Secondary | ICD-10-CM | POA: Diagnosis not present

## 2015-06-04 ENCOUNTER — Encounter
Admission: RE | Disposition: A | Discharge status: Home / Self Care | Payer: Self-pay | Source: Ambulatory Visit | Attending: Unknown Physician Specialty

## 2015-06-04 ENCOUNTER — Ambulatory Visit: Payer: Medicare Other | Admitting: Anesthesiology

## 2015-06-04 ENCOUNTER — Encounter: Payer: Self-pay | Admitting: *Deleted

## 2015-06-04 ENCOUNTER — Ambulatory Visit
Admission: RE | Admit: 2015-06-04 | Discharge: 2015-06-04 | Disposition: A | Discharge status: Home / Self Care | Payer: Medicare Other | Source: Ambulatory Visit | Attending: Unknown Physician Specialty | Admitting: Unknown Physician Specialty

## 2015-06-04 DIAGNOSIS — Z980 Intestinal bypass and anastomosis status: Secondary | ICD-10-CM | POA: Diagnosis not present

## 2015-06-04 DIAGNOSIS — Z85038 Personal history of other malignant neoplasm of large intestine: Secondary | ICD-10-CM | POA: Diagnosis not present

## 2015-06-04 DIAGNOSIS — Z9221 Personal history of antineoplastic chemotherapy: Secondary | ICD-10-CM | POA: Insufficient documentation

## 2015-06-04 DIAGNOSIS — I10 Essential (primary) hypertension: Secondary | ICD-10-CM | POA: Insufficient documentation

## 2015-06-04 DIAGNOSIS — Z853 Personal history of malignant neoplasm of breast: Secondary | ICD-10-CM | POA: Insufficient documentation

## 2015-06-04 DIAGNOSIS — Z79899 Other long term (current) drug therapy: Secondary | ICD-10-CM | POA: Insufficient documentation

## 2015-06-04 DIAGNOSIS — Z86010 Personal history of colonic polyps: Secondary | ICD-10-CM | POA: Diagnosis not present

## 2015-06-04 DIAGNOSIS — K648 Other hemorrhoids: Secondary | ICD-10-CM | POA: Insufficient documentation

## 2015-06-04 DIAGNOSIS — K219 Gastro-esophageal reflux disease without esophagitis: Secondary | ICD-10-CM | POA: Diagnosis not present

## 2015-06-04 DIAGNOSIS — Z7982 Long term (current) use of aspirin: Secondary | ICD-10-CM | POA: Diagnosis not present

## 2015-06-04 DIAGNOSIS — Z1211 Encounter for screening for malignant neoplasm of colon: Secondary | ICD-10-CM | POA: Insufficient documentation

## 2015-06-04 HISTORY — PX: COLONOSCOPY WITH PROPOFOL: SHX5780

## 2015-06-04 SURGERY — COLONOSCOPY WITH PROPOFOL
Anesthesia: General

## 2015-06-04 MED ORDER — SODIUM CHLORIDE 0.9 % IV SOLN
INTRAVENOUS | Status: DC
  Administered 2015-06-04: 1000 mL via INTRAVENOUS

## 2015-06-04 MED ORDER — SODIUM CHLORIDE 0.9 % IV SOLN
INTRAVENOUS | Status: DC
Start: 2015-06-04 — End: 2015-06-04

## 2015-06-04 MED ORDER — LIDOCAINE HCL (CARDIAC) 20 MG/ML IV SOLN
INTRAVENOUS | Status: DC | PRN
  Administered 2015-06-04: 60 mg via INTRAVENOUS

## 2015-06-04 MED ORDER — PROPOFOL 500 MG/50ML IV EMUL
INTRAVENOUS | Status: DC | PRN
  Administered 2015-06-04: 140 ug/kg/min via INTRAVENOUS

## 2015-06-04 NOTE — Anesthesia Postprocedure Evaluation (Signed)
Anesthesia Post Note  Patient: Veronica Colon  Procedure(s) Performed: Procedure(s) (LRB): COLONOSCOPY WITH PROPOFOL (N/A)  Patient location during evaluation: PACU Anesthesia Type: General Level of consciousness: awake and alert Pain management: pain level controlled Vital Signs Assessment: post-procedure vital signs reviewed and stable Respiratory status: spontaneous breathing, nonlabored ventilation, respiratory function stable and patient connected to nasal cannula oxygen Cardiovascular status: blood pressure returned to baseline and stable Postop Assessment: No signs of nausea or vomiting Anesthetic complications: no    Last Vitals:  Filed Vitals:   06/04/15 1055 06/04/15 1105  BP: 140/73 134/58  Pulse: 53 52  Temp:    Resp: 13 16    Last Pain: There were no vitals filed for this visit.               Martha Clan

## 2015-06-04 NOTE — Transfer of Care (Signed)
Immediate Anesthesia Transfer of Care Note  Patient: Veronica Colon  Procedure(s) Performed: Procedure(s): COLONOSCOPY WITH PROPOFOL (N/A)  Patient Location: PACU and Endoscopy Unit  Anesthesia Type:General  Level of Consciousness: awake, alert  and oriented  Airway & Oxygen Therapy: Patient Spontanous Breathing and Patient connected to nasal cannula oxygen  Post-op Assessment: Report given to RN and Post -op Vital signs reviewed and stable  Post vital signs: Reviewed and stable  Last Vitals: 10:36 62 hr 98% sat 14 resp 96.9 temp 110/51 Filed Vitals:   06/04/15 0934  BP: 146/68  Pulse: 59  Temp: 36.4 C  Resp: 16    Complications: No apparent anesthesia complications

## 2015-06-04 NOTE — H&P (Signed)
   Primary Care Physician:  Kathrine Haddock, NP Primary Gastroenterologist:  Dr. Vira Agar  Pre-Procedure History & Physical: HPI:  Veronica Colon is a 81 y.o. female is here for an colonoscopy.   Past Medical History  Diagnosis Date  . Status post chemotherapy     breast cancer  . Allergy   . GERD (gastroesophageal reflux disease)   . Cancer ([place])     breast 1990  . Hypertension     Past Surgical History  Procedure Laterality Date  . Breast surgery    . Abdominal hysterectomy      Prior to Admission medications   Medication Sig Start Date End Date Taking? Authorizing Provider  amLODipine (NORVASC) 5 MG tablet Take 1 tablet (5 mg total) by mouth daily. 04/14/15  Yes Kathrine Haddock, NP  aspirin 81 MG tablet Take 81 mg by mouth daily.    Historical Provider, MD  fluticasone (FLONASE) 50 MCG/ACT nasal spray Place 2 sprays into both nostrils daily. 03/24/15   Arnetha Courser, MD    Allergies as of 05/14/2015 - Review Complete 04/14/2015  Allergen Reaction Noted  . Hydrochlorothiazide Other (See Comments) 02/26/2015  . Lisinopril Other (See Comments) and Cough 02/26/2015    Family History  Problem Relation Age of Onset  . Breast cancer Mother 32  . Cancer Mother     breast  . Cancer Father     lung    Social History   Social History  . Marital Status: Divorced    Spouse Name: N/A  . Number of Children: N/A  . Years of Education: N/A   Occupational History  . Not on file.   Social History Main Topics  . Smoking status: Never Smoker   . Smokeless tobacco: Never Used  . Alcohol Use: No  . Drug Use: No  . Sexual Activity: Yes   Other Topics Concern  . Not on file   Social History Narrative    Review of Systems: See HPI, otherwise negative ROS  Physical Exam: BP 146/68 mmHg  Pulse 59  Temp(Src) 97.6 F (36.4 C) (Oral)  Resp 16  Ht 5\' 5"  (1.651 m)  Wt 90.719 kg (200 lb)  BMI 33.28 kg/m2  SpO2 100%  LMP  (LMP Unknown) General:   Alert,  pleasant and  cooperative in NAD Head:  Normocephalic and atraumatic. Neck:  Supple; no masses or thyromegaly. Lungs:  Clear throughout to auscultation.    Heart:  Regular rate and rhythm. Abdomen:  Soft, nontender and nondistended. Normal bowel sounds, without guarding, and without rebound.   Neurologic:  Alert and  oriented x4;  grossly normal neurologically.  Impression/Plan: Veronica Colon is here for an colonoscopy to be performed for [facility]  Risks, benefits, limitations, and alternatives regarding  colonoscopy have been reviewed with the patient.  Questions have been answered.  All parties agreeable.   Gaylyn Cheers, MD  06/04/2015, 10:10 AM

## 2015-06-04 NOTE — Op Note (Signed)
[facility] Gastroenterology Patient Name: Veronica Colon Procedure Date: 06/04/2015 10:15 AM MRN: [id number] Account #: [id number] Date of Birth: 1931/11/22 Admit Type: Outpatient Age: 81 Room: [facility] ENDO ROOM 4 Gender: Female Note Status: Finalized Procedure:         Colonoscopy Indications:       High risk colon cancer surveillance: Personal history of                     colon cancer Providers:         Manya Silvas, MD Referring MD:      Kerin Salen Kathrine Haddock Fnp, MD (Referring MD) Medicines:         Propofol per Anesthesia Complications:     No immediate complications. Procedure:         Pre-Anesthesia Assessment:                    - After reviewing the risks and benefits, the patient was                     deemed in satisfactory condition to undergo the procedure.                    After obtaining informed consent, the colonoscope was                     passed under direct vision. Throughout the procedure, the                     patient's blood pressure, pulse, and oxygen saturations                     were monitored continuously. The Colonoscope was                     introduced through the anus and advanced to the the                     ileocolonic anastomosis. The colonoscopy was performed                     without difficulty. The patient tolerated the procedure                     well. The quality of the bowel preparation was excellent. Findings:      Internal hemorrhoids were found during endoscopy. The hemorrhoids were       small. Anastamosis of colon and small bowel seen and picture done.      The exam was otherwise without abnormality. Impression:        - Internal hemorrhoids.                    - The examination was otherwise normal.                    - No specimens collected. Recommendation:    - The findings and recommendations were discussed with the                     patient's family. Manya Silvas, MD 06/04/2015  10:34:53 AM This report has been signed electronically. Number of Addenda: 0 Note Initiated On: 06/04/2015 10:15 AM Scope Withdrawal Time: 0 hours 6 minutes 21 seconds  Total Procedure Duration: 0 hours 11 minutes 12 seconds  [facility]

## 2015-06-04 NOTE — Anesthesia Preprocedure Evaluation (Addendum)
Anesthesia Evaluation  Patient identified by MRN, date of birth, ID band Patient awake    Reviewed: Allergy & Precautions, H&P , NPO status , Patient's Chart, lab work & pertinent test results, reviewed documented beta blocker date and time   History of Anesthesia Complications Negative for: history of anesthetic complications  Airway Mallampati: II  TM Distance: >3 FB Neck ROM: full    Dental no notable dental hx. (+) Teeth Intact   Pulmonary neg pulmonary ROS,    Pulmonary exam normal breath sounds clear to auscultation       Cardiovascular Exercise Tolerance: Good hypertension, On Medications (-) angina(-) CAD, (-) Past MI, (-) Cardiac Stents and (-) CABG Normal cardiovascular exam(-) dysrhythmias (-) Valvular Problems/Murmurs Rhythm:regular Rate:Normal     Neuro/Psych negative neurological ROS  negative psych ROS   GI/Hepatic Neg liver ROS, GERD  Controlled,  Endo/Other  negative endocrine ROS  Renal/GU CRFRenal disease  negative genitourinary   Musculoskeletal   Abdominal   Peds  Hematology negative hematology ROS (+)   Anesthesia Other Findings Past Medical History:   Status post chemotherapy                                       Comment:breast cancer   Allergy                                                      GERD (gastroesophageal reflux disease)                       Cancer ([place])                                                   Comment:breast 1990   Hypertension                                                 Reproductive/Obstetrics negative OB ROS                             Anesthesia Physical Anesthesia Plan  ASA: II  Anesthesia Plan: General   Post-op Pain Management:    Induction:   Airway Management Planned:   Additional Equipment:   Intra-op Plan:   Post-operative Plan:   Informed Consent: I have reviewed the patients History and Physical, chart,  labs and discussed the procedure including the risks, benefits and alternatives for the proposed anesthesia with the patient or authorized representative who has indicated his/her understanding and acceptance.   Dental Advisory Given  Plan Discussed with: Anesthesiologist, CRNA and Surgeon  Anesthesia Plan Comments:         Anesthesia Quick Evaluation

## 2015-06-09 ENCOUNTER — Encounter: Payer: Self-pay | Admitting: Unknown Physician Specialty

## 2015-08-12 ENCOUNTER — Other Ambulatory Visit: Payer: Self-pay | Admitting: *Deleted

## 2015-08-12 DIAGNOSIS — Z85038 Personal history of other malignant neoplasm of large intestine: Secondary | ICD-10-CM

## 2015-08-13 ENCOUNTER — Inpatient Hospital Stay (HOSPITAL_BASED_OUTPATIENT_CLINIC_OR_DEPARTMENT_OTHER): Payer: Medicare Other | Admitting: Oncology

## 2015-08-13 ENCOUNTER — Inpatient Hospital Stay: Payer: Medicare Other | Attending: Oncology

## 2015-08-13 VITALS — BP 150/77 | HR 64 | Temp 97.7°F | Resp 16 | Wt 207.9 lb

## 2015-08-13 DIAGNOSIS — Z800 Family history of malignant neoplasm of digestive organs: Secondary | ICD-10-CM | POA: Insufficient documentation

## 2015-08-13 DIAGNOSIS — Z853 Personal history of malignant neoplasm of breast: Secondary | ICD-10-CM | POA: Diagnosis not present

## 2015-08-13 DIAGNOSIS — Z9221 Personal history of antineoplastic chemotherapy: Secondary | ICD-10-CM | POA: Diagnosis not present

## 2015-08-13 DIAGNOSIS — K219 Gastro-esophageal reflux disease without esophagitis: Secondary | ICD-10-CM | POA: Diagnosis not present

## 2015-08-13 DIAGNOSIS — Z7982 Long term (current) use of aspirin: Secondary | ICD-10-CM | POA: Diagnosis not present

## 2015-08-13 DIAGNOSIS — C189 Malignant neoplasm of colon, unspecified: Secondary | ICD-10-CM

## 2015-08-13 DIAGNOSIS — Z79899 Other long term (current) drug therapy: Secondary | ICD-10-CM

## 2015-08-13 DIAGNOSIS — I10 Essential (primary) hypertension: Secondary | ICD-10-CM | POA: Insufficient documentation

## 2015-08-13 DIAGNOSIS — Z803 Family history of malignant neoplasm of breast: Secondary | ICD-10-CM

## 2015-08-13 DIAGNOSIS — Z85038 Personal history of other malignant neoplasm of large intestine: Secondary | ICD-10-CM

## 2015-08-13 LAB — CBC WITH DIFFERENTIAL/PLATELET
Basophils Absolute: 0 10*3/uL (ref 0–0.1)
Basophils Relative: 1 %
Eosinophils Absolute: 0.1 10*3/uL (ref 0–0.7)
Eosinophils Relative: 2 %
HCT: 41.9 % (ref 35.0–47.0)
Hemoglobin: 13.7 g/dL (ref 12.0–16.0)
Lymphocytes Relative: 24 %
Lymphs Abs: 1.4 10*3/uL (ref 1.0–3.6)
MCH: 27.4 pg (ref 26.0–34.0)
MCHC: 32.6 g/dL (ref 32.0–36.0)
MCV: 84 fL (ref 80.0–100.0)
Monocytes Absolute: 0.4 10*3/uL (ref 0.2–0.9)
Monocytes Relative: 8 %
Neutro Abs: 3.9 10*3/uL (ref 1.4–6.5)
Neutrophils Relative %: 65 %
Platelets: 196 10*3/uL (ref 150–440)
RBC: 4.99 MIL/uL (ref 3.80–5.20)
RDW: 14.5 % (ref 11.5–14.5)
WBC: 5.9 10*3/uL (ref 3.6–11.0)

## 2015-08-13 NOTE — Progress Notes (Signed)
[place]  [phone] [phone]) [phone]  ID: Veronica Colon OB: 1931-12-10  MR#: [id number]  [id number]  Patient Care Team: Kathrine Haddock, NP as PCP - General (Nurse Practitioner)  CHIEF COMPLAINT:  Chief Complaint  Patient presents with  . Colon Cancer    INTERVAL HISTORY: Patient returns to clinic today for repeat laboratory work and routine yearly evaluation.  She continues to feel well and remains asymptomatic.  She has noted no changes in her bowel movements and denies any melena or hematochezia.  She has a good appetite and denies weight loss.  She has had no recent fevers or illnesses.  She has no chest pain or shortness of breath.  She denies any nausea, vomiting, constipation, or diarrhea.  She has no urinary complaints.  Patient offers no specific complaints today.   REVIEW OF SYSTEMS:   Review of Systems  Constitutional: Negative for fever, weight loss and malaise/fatigue.  Respiratory: Negative.   Cardiovascular: Negative.   Gastrointestinal: Negative.  Negative for abdominal pain, blood in stool and melena.  Musculoskeletal: Negative.   Neurological: Negative.  Negative for weakness.    As per HPI. Otherwise, a complete review of systems is negatve.  PAST MEDICAL HISTORY: Past Medical History  Diagnosis Date  . Status post chemotherapy     breast cancer  . Allergy   . GERD (gastroesophageal reflux disease)   . Cancer ([place])     breast 1990  . Hypertension     PAST SURGICAL HISTORY: Past Surgical History  Procedure Laterality Date  . Breast surgery    . Abdominal hysterectomy    . Colonoscopy with propofol N/A 06/04/2015    Procedure: COLONOSCOPY WITH PROPOFOL;  Surgeon: Manya Silvas, MD;  [facility]: [facility] ENDOSCOPY;  Service: Endoscopy;  Laterality: N/A;    FAMILY HISTORY Family History  Problem Relation Age of Onset  . Breast cancer Mother 77  . Cancer Mother     breast  . Cancer Father     lung        ADVANCED DIRECTIVES:    HEALTH MAINTENANCE: Social History  Substance Use Topics  . Smoking status: Never Smoker   . Smokeless tobacco: Never Used  . Alcohol Use: No     Colonoscopy:  PAP:  Bone density:  Lipid panel:  Allergies  Allergen Reactions  . Hydrochlorothiazide Other (See Comments)    Hypokalemia   . Lisinopril Other (See Comments) and Cough    Hands numb     Current Outpatient Prescriptions  Medication Sig Dispense Refill  . amLODipine (NORVASC) 5 MG tablet Take 1 tablet (5 mg total) by mouth daily. 90 tablet 1  . aspirin 81 MG tablet Take 81 mg by mouth daily.    . fluticasone (FLONASE) 50 MCG/ACT nasal spray Place 2 sprays into both nostrils daily. 16 g 0   No current facility-administered medications for this visit.    OBJECTIVE: Filed Vitals:   08/13/15 1050  BP: 150/77  Pulse: 64  Temp: 97.7 F (36.5 C)  Resp: 16     Body mass index is 34.6 kg/(m^2).    ECOG FS:0 - Asymptomatic  General: Well-developed, well-nourished, no acute distress. Eyes: Pink conjunctiva, anicteric sclera. Lungs: Clear to auscultation bilaterally. Heart: Regular rate and rhythm. No rubs, murmurs, or gallops. Abdomen: Soft, nontender, nondistended. No organomegaly noted, normoactive bowel sounds. Musculoskeletal: No edema, cyanosis, or clubbing. Neuro: Alert, answering all questions appropriately. Cranial nerves grossly intact. Skin: No rashes or petechiae noted. Psych: Normal  affect.   LAB RESULTS:  Lab Results  Component Value Date   NA 144 04/14/2015   K 4.3 04/14/2015   CL 103 04/14/2015   CO2 25 04/14/2015   GLUCOSE 81 04/14/2015   BUN 16 04/14/2015   CREATININE 0.87 04/14/2015   CALCIUM 9.7 04/14/2015   PROT 6.7 04/14/2015   ALBUMIN 4.1 04/14/2015   AST 21 04/14/2015   ALT 15 04/14/2015   ALKPHOS 96 04/14/2015   BILITOT 0.4 04/14/2015   GFRNONAA 63 04/14/2015   GFRAA 72 04/14/2015    Lab Results  Component Value Date   WBC 5.9  08/13/2015   NEUTROABS 3.9 08/13/2015   HGB 13.7 08/13/2015   HCT 41.9 08/13/2015   MCV 84.0 08/13/2015   PLT 196 08/13/2015   Lab Results  Component Value Date   CEA 2.0 08/13/2015     STUDIES: No results found.  ASSESSMENT: Stage I adenocarcinoma of the colon.  PLAN:    1.  Colon cancer: No evidence of disease.  Patient's preop CEA was elevated at 25.6.  CEA continues to be within normal limits, Patient did not require adjuvant chemotherapy given the stage of her disease.  Patient had a colonoscopy on June 04, 2015 which was reported as normal. Patient states her GI physician told her that she does not require any further colonoscopies. Patient is nearly 5 years removed from her diagnosis and can be discharged from clinic. She has been instructed to continue routine follow-up with primary care and can return to clinic if there are any questions or concerns.   Patient expressed understanding and was in agreement with this plan. She also understands that She can call clinic at any time with any questions, concerns, or complaints.    Lloyd Huger, MD   08/17/2015 8:56 AM

## 2015-08-13 NOTE — Progress Notes (Signed)
Patient does not offer any problems today.  

## 2015-08-14 LAB — CEA: CEA: 2 ng/mL (ref 0.0–4.7)

## 2015-09-28 DIAGNOSIS — J209 Acute bronchitis, unspecified: Secondary | ICD-10-CM | POA: Diagnosis not present

## 2015-10-11 ENCOUNTER — Other Ambulatory Visit: Payer: Self-pay | Admitting: Unknown Physician Specialty

## 2015-10-12 NOTE — Telephone Encounter (Signed)
rx

## 2015-10-13 ENCOUNTER — Encounter: Payer: Self-pay | Admitting: Unknown Physician Specialty

## 2015-10-13 ENCOUNTER — Ambulatory Visit (INDEPENDENT_AMBULATORY_CARE_PROVIDER_SITE_OTHER): Payer: Medicare Other | Admitting: Unknown Physician Specialty

## 2015-10-13 VITALS — BP 134/80 | HR 62 | Temp 98.4°F | Ht 62.8 in | Wt 206.8 lb

## 2015-10-13 DIAGNOSIS — I129 Hypertensive chronic kidney disease with stage 1 through stage 4 chronic kidney disease, or unspecified chronic kidney disease: Secondary | ICD-10-CM | POA: Diagnosis not present

## 2015-10-13 MED ORDER — AMLODIPINE BESYLATE 5 MG PO TABS: 5.0000 mg | ORAL_TABLET | Freq: Every day | ORAL | Status: DC

## 2015-10-13 NOTE — Progress Notes (Signed)
BP 134/80 mmHg  Pulse 62  Temp(Src) 98.4 F (36.9 C)  Ht 5' 2.8" (1.595 m)  Wt 206 lb 12.8 oz (93.804 kg)  BMI 36.87 kg/m2  SpO2 97%  LMP  (LMP Unknown)   Subjective:    Patient ID: Veronica Colon, female    DOB: 1931/07/29, 82 y.o.   MRN: [id number]  HPI: Veronica Colon is a 82 y.o. female  Chief Complaint  Patient presents with  . Hypertension   Hypertension Using medications without difficulty Average home BPs: doesn't take   No problems or lightheadedness No chest pain with exertion or shortness of breath No Edema: occasional in ankles  Relevant past medical, surgical, family and social history reviewed and updated as indicated. Interim medical history since our last visit reviewed. Allergies and medications reviewed and updated.  Review of Systems  Constitutional: Negative.   HENT: Negative.   Eyes: Negative.   Respiratory: Negative.   Cardiovascular: Negative.   Gastrointestinal: Negative.   Endocrine: Negative.   Genitourinary: Negative.   Musculoskeletal: Negative.   Skin: Negative.   Allergic/Immunologic: Negative.   Neurological: Negative.   Hematological: Negative.   Psychiatric/Behavioral: Negative.     Per HPI unless specifically indicated above     Objective:    BP 134/80 mmHg  Pulse 62  Temp(Src) 98.4 F (36.9 C)  Ht 5' 2.8" (1.595 m)  Wt 206 lb 12.8 oz (93.804 kg)  BMI 36.87 kg/m2  SpO2 97%  LMP  (LMP Unknown)  Wt Readings from Last 3 Encounters:  10/13/15 206 lb 12.8 oz (93.804 kg)  08/13/15 207 lb 14.3 oz (94.3 kg)  06/04/15 200 lb (90.719 kg)    Physical Exam  Constitutional: She is oriented to person, place, and time. She appears well-developed and well-nourished. No distress.  HENT:  Head: Normocephalic and atraumatic.  Eyes: Conjunctivae and lids are normal. Right eye exhibits no discharge. Left eye exhibits no discharge. No scleral icterus.  Neck: Normal range of motion. Neck supple. No JVD present. Carotid bruit is not  present.  Cardiovascular: Normal rate, regular rhythm and normal heart sounds.   Pulmonary/Chest: Effort normal and breath sounds normal.  Abdominal: Normal appearance. There is no splenomegaly or hepatomegaly.  Musculoskeletal: Normal range of motion.  Neurological: She is alert and oriented to person, place, and time.  Skin: Skin is warm, dry and intact. No rash noted. No pallor.  Psychiatric: She has a normal mood and affect. Her behavior is normal. Judgment and thought content normal.    Results for orders placed or performed in visit on 08/13/15  CBC with Differential/Platelet  Result Value Ref Range   WBC 5.9 3.6 - 11.0 K/uL   RBC 4.99 3.80 - 5.20 MIL/uL   Hemoglobin 13.7 12.0 - 16.0 g/dL   HCT 41.9 35.0 - 47.0 %   MCV 84.0 80.0 - 100.0 fL   MCH 27.4 26.0 - 34.0 pg   MCHC 32.6 32.0 - 36.0 g/dL   RDW 14.5 11.5 - 14.5 %   Platelets 196 150 - 440 K/uL   Neutrophils Relative % 65 %   Neutro Abs 3.9 1.4 - 6.5 K/uL   Lymphocytes Relative 24 %   Lymphs Abs 1.4 1.0 - 3.6 K/uL   Monocytes Relative 8 %   Monocytes Absolute 0.4 0.2 - 0.9 K/uL   Eosinophils Relative 2 %   Eosinophils Absolute 0.1 0 - 0.7 K/uL   Basophils Relative 1 %   Basophils Absolute 0.0 0 - 0.1 K/uL  CEA  Result Value Ref Range   CEA 2.0 0.0 - 4.7 ng/mL      Assessment & Plan:   Problem List Items Addressed This Visit      Unprioritized   Hypertensive CKD (chronic kidney disease) - Primary    Stable, continue present medications.        Relevant Orders   Comprehensive metabolic panel       Follow up plan: Return in about 6 months (around 04/13/2016) for physical.

## 2015-10-13 NOTE — Assessment & Plan Note (Signed)
Stable, continue present medications.   

## 2015-10-13 NOTE — Telephone Encounter (Signed)
Needs seen further refills 

## 2015-10-14 LAB — COMPREHENSIVE METABOLIC PANEL
ALT: 15 IU/L (ref 0–32)
AST: 19 IU/L (ref 0–40)
Albumin/Globulin Ratio: 1.7 (ref 1.2–2.2)
Albumin: 3.8 g/dL (ref 3.5–4.7)
Alkaline Phosphatase: 87 IU/L (ref 39–117)
BUN/Creatinine Ratio: 17 (ref 12–28)
BUN: 19 mg/dL (ref 8–27)
Bilirubin Total: 0.5 mg/dL (ref 0.0–1.2)
CO2: 24 mmol/L (ref 18–29)
Calcium: 9.4 mg/dL (ref 8.7–10.3)
Chloride: 100 mmol/L (ref 96–106)
Creatinine, Ser: 1.12 mg/dL — ABNORMAL HIGH (ref 0.57–1.00)
GFR calc Af Amer: 53 mL/min/{1.73_m2} — ABNORMAL LOW (ref >59)
GFR calc non Af Amer: 46 mL/min/{1.73_m2} — ABNORMAL LOW (ref >59)
Globulin, Total: 2.3 g/dL (ref 1.5–4.5)
Glucose: 92 mg/dL (ref 65–99)
Potassium: 4.2 mmol/L (ref 3.5–5.2)
Sodium: 141 mmol/L (ref 134–144)
Total Protein: 6.1 g/dL (ref 6.0–8.5)

## 2015-10-27 NOTE — Telephone Encounter (Signed)
No need. Thanks

## 2015-10-27 NOTE — Telephone Encounter (Signed)
Pt was seen on 10/13/15 has next appt in October. Does pt need to be seen again before October for medication follow up? Please advise. Thanks.

## 2015-10-27 NOTE — Telephone Encounter (Signed)
Veronica Colon, patient was just seen 10/13/15 and has f/u scheduled in October. Does she need to be seen before then?

## 2015-11-10 ENCOUNTER — Other Ambulatory Visit: Payer: Self-pay | Admitting: Unknown Physician Specialty

## 2015-11-26 ENCOUNTER — Other Ambulatory Visit: Payer: Self-pay | Admitting: Unknown Physician Specialty

## 2015-11-26 DIAGNOSIS — Z1231 Encounter for screening mammogram for malignant neoplasm of breast: Secondary | ICD-10-CM

## 2015-12-29 ENCOUNTER — Ambulatory Visit
Admission: RE | Admit: 2015-12-29 | Discharge: 2015-12-29 | Disposition: A | Discharge status: Home / Self Care | Payer: Medicare Other | Source: Ambulatory Visit | Attending: Unknown Physician Specialty | Admitting: Unknown Physician Specialty

## 2015-12-29 ENCOUNTER — Other Ambulatory Visit: Payer: Self-pay | Admitting: Unknown Physician Specialty

## 2015-12-29 DIAGNOSIS — Z1231 Encounter for screening mammogram for malignant neoplasm of breast: Secondary | ICD-10-CM | POA: Insufficient documentation

## 2015-12-29 HISTORY — DX: Malignant neoplasm of unspecified site of unspecified female breast: C50.919

## 2016-03-09 ENCOUNTER — Encounter: Payer: Self-pay | Admitting: Podiatry

## 2016-03-09 ENCOUNTER — Ambulatory Visit (INDEPENDENT_AMBULATORY_CARE_PROVIDER_SITE_OTHER): Payer: Medicare Other | Admitting: Podiatry

## 2016-03-09 DIAGNOSIS — B351 Tinea unguium: Secondary | ICD-10-CM

## 2016-03-09 NOTE — Progress Notes (Signed)
   Subjective:    Patient ID: Veronica Colon, female    DOB: 09/14/1931, 82 y.o.   MRN: [id number]  HPI  82 year old female presents the office today for concerns of her left big toe nail becoming dark and thick. She states that the area disc painful at times of pressure but denies any redness or drainage or any swelling or any other signs of infection. This been ongoing for several years. No treatment. No other complaints.  Review of Systems  All other systems reviewed and are negative.      Objective:   Physical Exam General: AAO x3, NAD  Dermatological: Left hallux nails hypertrophic, dystrophic, discolored, brittle. There is no tenderness to palpation at this time there is no edema, erythema, drainage. There is no clinical signs of infection. No open lesions or pre-ulcerative lesions.  Vascular: Dorsalis Pedis artery and Posterior Tibial artery pedal pulses are 2/4 bilateral with immedate capillary fill time. Pedal hair growth present. There is no pain with calf compression, swelling, warmth, erythema.   Neruologic: Grossly intact via light touch bilateral. Vibratory intact via tuning fork bilateral. Protective threshold with Semmes Wienstein monofilament intact to all pedal sites bilateral.  Musculoskeletal: No gross boney pedal deformities bilateral. No pain, crepitus, or limitation noted with foot and ankle range of motion bilateral. Muscular strength 5/5 in all groups tested bilateral.  Gait: Unassisted, Nonantalgic.      Assessment & Plan:  82 year old female left hallux onychodystrophy likely onychomycosis -Treatment options discussed including all alternatives, risks, and complications -Etiology of symptoms were discussed -Left hallux toenail was debrided was sent for culture to [facility] labs for evaluation of onychomycosis. Discussed treatment options will await results the biopsy/culture before proceeding with definitive treatment. She agrees to this plan.  Celesta Gentile,  DPM

## 2016-04-05 ENCOUNTER — Other Ambulatory Visit: Payer: Self-pay | Admitting: Unknown Physician Specialty

## 2016-04-20 ENCOUNTER — Encounter: Payer: Self-pay | Admitting: Unknown Physician Specialty

## 2016-04-20 ENCOUNTER — Ambulatory Visit (INDEPENDENT_AMBULATORY_CARE_PROVIDER_SITE_OTHER): Payer: Medicare Other | Admitting: Unknown Physician Specialty

## 2016-04-20 VITALS — BP 154/79 | HR 64 | Temp 98.7°F | Ht 63.5 in | Wt 202.8 lb

## 2016-04-20 DIAGNOSIS — F418 Other specified anxiety disorders: Secondary | ICD-10-CM

## 2016-04-20 DIAGNOSIS — I129 Hypertensive chronic kidney disease with stage 1 through stage 4 chronic kidney disease, or unspecified chronic kidney disease: Secondary | ICD-10-CM

## 2016-04-20 DIAGNOSIS — Z23 Encounter for immunization: Secondary | ICD-10-CM

## 2016-04-20 DIAGNOSIS — R0789 Other chest pain: Secondary | ICD-10-CM

## 2016-04-20 DIAGNOSIS — R0602 Shortness of breath: Secondary | ICD-10-CM

## 2016-04-20 DIAGNOSIS — Z0000 Encounter for general adult medical examination without abnormal findings: Secondary | ICD-10-CM

## 2016-04-20 DIAGNOSIS — F419 Anxiety disorder, unspecified: Secondary | ICD-10-CM | POA: Insufficient documentation

## 2016-04-20 DIAGNOSIS — R002 Palpitations: Secondary | ICD-10-CM

## 2016-04-20 DIAGNOSIS — F329 Major depressive disorder, single episode, unspecified: Secondary | ICD-10-CM

## 2016-04-20 MED ORDER — CITALOPRAM HYDROBROMIDE 20 MG PO TABS: 20.0000 mg | ORAL_TABLET | Freq: Every day | ORAL | 1 refills | Status: DC

## 2016-04-20 MED ORDER — AMLODIPINE BESYLATE 5 MG PO TABS: 5.0000 mg | ORAL_TABLET | Freq: Every day | ORAL | 1 refills | Status: DC

## 2016-04-20 MED ORDER — FLUTICASONE PROPIONATE 50 MCG/ACT NA SUSP: 2.0000 | Freq: Every day | NASAL | 0 refills | Status: DC

## 2016-04-20 NOTE — Assessment & Plan Note (Signed)
Not to goal today but nervous

## 2016-04-20 NOTE — Progress Notes (Signed)
BP (!) 154/79 (BP Location: Left Arm, Patient Position: Sitting, Cuff Size: Large)   Pulse 64   Temp 98.7 F (37.1 C)   Ht 5' 3.5" (1.613 m)   Wt 202 lb 12.8 oz (92 kg)   LMP  (LMP Unknown)   SpO2 96%   BMI 35.36 kg/m    Subjective:    Patient ID: Veronica Colon, female    DOB: [birth year], 82 y.o.   MRN: [id number]  HPI: Veronica Colon is a 82 y.o. female  Chief Complaint  Patient presents with  . Medicare Wellness  . Medication Refill    pt states she needs refills on flonase sent in   Fall Risk  04/20/2016 04/14/2015  Falls in the past year? No No   Depression screen [facility] 2/9 04/20/2016 04/14/2015  Decreased Interest 2 0  Down, Depressed, Hopeless 2 0  PHQ - 2 Score 4 0  Altered sleeping 0 -  Tired, decreased energy 1 -  Change in appetite 0 -  Feeling bad or failure about yourself  1 -  Trouble concentrating 1 -  Moving slowly or fidgety/restless 1 -  Suicidal thoughts 0 -  PHQ-9 Score 8 -   Functional Status Survey: Is the patient deaf or have difficulty hearing?: Yes Does the patient have difficulty seeing, even when wearing glasses/contacts?: No Does the patient have difficulty concentrating, remembering, or making decisions?: No Does the patient have difficulty walking or climbing stairs?: No Does the patient have difficulty dressing or bathing?: No Does the patient have difficulty doing errands alone such as visiting a doctor's office or shopping?: No  Social History   Social History  . Marital status: Divorced    Spouse name: N/A  . Number of children: N/A  . Years of education: N/A   Occupational History  . Not on file.   Social History Main Topics  . Smoking status: Never Smoker  . Smokeless tobacco: Never Used  . Alcohol use No  . Drug use: No  . Sexual activity: Yes   Other Topics Concern  . Not on file   Social History Narrative  . No narrative on file   Hypertension Using medications without difficulty Average home BPs   No  problems or lightheadedness No chest pain with exertion or shortness of breath No Edema  Anxiety Pt states she feels nervous starting in chest area and then gets "hot."  This does not happen with activity.  Daughter thinks she is worried about her breast cancer is coming back  Memory Daughter states her memory is good and has no problems.  Mini cog is negative.    Relevant past medical, surgical, family and social history reviewed and updated as indicated. Interim medical history since our last visit reviewed. Allergies and medications reviewed and updated.  Review of Systems  Constitutional: Negative.   HENT: Negative.   Eyes: Negative.   Respiratory: Negative.   Cardiovascular: Negative.   Gastrointestinal: Negative.   Endocrine: Negative.   Genitourinary: Negative.   Musculoskeletal: Negative.   Skin: Negative.   Allergic/Immunologic: Negative.   Neurological: Negative.   Hematological: Negative.   Psychiatric/Behavioral: Negative.     Per HPI unless specifically indicated above     Objective:    BP (!) 154/79 (BP Location: Left Arm, Patient Position: Sitting, Cuff Size: Large)   Pulse 64   Temp 98.7 F (37.1 C)   Ht 5' 3.5" (1.613 m)   Wt 202 lb 12.8 oz (92 kg)  LMP  (LMP Unknown)   SpO2 96%   BMI 35.36 kg/m   Wt Readings from Last 3 Encounters:  04/20/16 202 lb 12.8 oz (92 kg)  10/13/15 206 lb 12.8 oz (93.8 kg)  08/13/15 207 lb 14.3 oz (94.3 kg)    Physical Exam  Constitutional: She is oriented to person, place, and time. She appears well-developed and well-nourished.  HENT:  Head: Normocephalic and atraumatic.  Eyes: Pupils are equal, round, and reactive to light. Right eye exhibits no discharge. Left eye exhibits no discharge. No scleral icterus.  Neck: Normal range of motion. Neck supple. Carotid bruit is not present. No thyromegaly present.  Cardiovascular: Normal rate, regular rhythm and normal heart sounds.  Exam reveals no gallop and no friction  rub.   No murmur heard. Pulmonary/Chest: Effort normal and breath sounds normal. No respiratory distress. She has no wheezes. She has no rales.  Abdominal: Soft. Bowel sounds are normal. There is no tenderness. There is no rebound.  Genitourinary: No breast swelling, tenderness or discharge.  Musculoskeletal: Normal range of motion.  Lymphadenopathy:    She has no cervical adenopathy.  Neurological: She is alert and oriented to person, place, and time.  Skin: Skin is warm, dry and intact. No rash noted.  Psychiatric: She has a normal mood and affect. Her speech is normal and behavior is normal. Judgment and thought content normal. Cognition and memory are normal.   EKG is normal  Results for orders placed or performed in visit on 10/13/15  Comprehensive metabolic panel  Result Value Ref Range   Glucose 92 65 - 99 mg/dL   BUN 19 8 - 27 mg/dL   Creatinine, Ser 1.12 (H) 0.57 - 1.00 mg/dL   GFR calc non Af Amer 46 (L) >59 mL/min/1.73   GFR calc Af Amer 53 (L) >59 mL/min/1.73   BUN/Creatinine Ratio 17 12 - 28   Sodium 141 134 - 144 mmol/L   Potassium 4.2 3.5 - 5.2 mmol/L   Chloride 100 96 - 106 mmol/L   CO2 24 18 - 29 mmol/L   Calcium 9.4 8.7 - 10.3 mg/dL   Total Protein 6.1 6.0 - 8.5 g/dL   Albumin 3.8 3.5 - 4.7 g/dL   Globulin, Total 2.3 1.5 - 4.5 g/dL   Albumin/Globulin Ratio 1.7 1.2 - 2.2   Bilirubin Total 0.5 0.0 - 1.2 mg/dL   Alkaline Phosphatase 87 39 - 117 IU/L   AST 19 0 - 40 IU/L   ALT 15 0 - 32 IU/L      Assessment & Plan:   Problem List Items Addressed This Visit      Unprioritized   Hypertensive CKD (chronic kidney disease)   Relevant Orders   Comprehensive metabolic panel   Lipid Panel w/o Chol/HDL Ratio    Other Visit Diagnoses    Need for influenza vaccination    -  Primary   Relevant Orders   Flu vaccine HIGH DOSE PF (Completed)   Other chest pain       Relevant Orders   EKG 12-Lead (Completed)   Heart palpitations       Relevant Orders   TSH    CBC with Differential/Platelet   SOB (shortness of breath)       Routine general medical examination at a health care facility           Follow up plan: No Follow-up on file.

## 2016-04-20 NOTE — Patient Instructions (Addendum)

## 2016-04-20 NOTE — Assessment & Plan Note (Signed)
Start Citalopram at 20 mg

## 2016-04-21 ENCOUNTER — Encounter: Payer: Self-pay | Admitting: Unknown Physician Specialty

## 2016-04-21 LAB — CBC WITH DIFFERENTIAL/PLATELET
Basophils Absolute: 0 10*3/uL (ref 0.0–0.2)
Basos: 0 %
EOS (ABSOLUTE): 0.1 10*3/uL (ref 0.0–0.4)
Eos: 2 %
Hematocrit: 38.8 % (ref 34.0–46.6)
Hemoglobin: 12.4 g/dL (ref 11.1–15.9)
Immature Grans (Abs): 0 10*3/uL (ref 0.0–0.1)
Immature Granulocytes: 0 %
Lymphocytes Absolute: 1.5 10*3/uL (ref 0.7–3.1)
Lymphs: 25 %
MCH: 27.1 pg (ref 26.6–33.0)
MCHC: 32 g/dL (ref 31.5–35.7)
MCV: 85 fL (ref 79–97)
Monocytes Absolute: 0.5 10*3/uL (ref 0.1–0.9)
Monocytes: 8 %
Neutrophils Absolute: 3.9 10*3/uL (ref 1.4–7.0)
Neutrophils: 65 %
Platelets: 218 10*3/uL (ref 150–379)
RBC: 4.57 x10E6/uL (ref 3.77–5.28)
RDW: 14.5 % (ref 12.3–15.4)
WBC: 6 10*3/uL (ref 3.4–10.8)

## 2016-04-21 LAB — LIPID PANEL W/O CHOL/HDL RATIO
Cholesterol, Total: 218 mg/dL — ABNORMAL HIGH (ref 100–199)
HDL: 75 mg/dL (ref >39)
LDL Calculated: 114 mg/dL — ABNORMAL HIGH (ref 0–99)
Triglycerides: 144 mg/dL (ref 0–149)
VLDL Cholesterol Cal: 29 mg/dL (ref 5–40)

## 2016-04-21 LAB — COMPREHENSIVE METABOLIC PANEL
ALT: 12 IU/L (ref 0–32)
AST: 19 IU/L (ref 0–40)
Albumin/Globulin Ratio: 1.8 (ref 1.2–2.2)
Albumin: 4.2 g/dL (ref 3.5–4.7)
Alkaline Phosphatase: 83 IU/L (ref 39–117)
BUN/Creatinine Ratio: 15 (ref 12–28)
BUN: 17 mg/dL (ref 8–27)
Bilirubin Total: 0.4 mg/dL (ref 0.0–1.2)
CO2: 27 mmol/L (ref 18–29)
Calcium: 9.4 mg/dL (ref 8.7–10.3)
Chloride: 102 mmol/L (ref 96–106)
Creatinine, Ser: 1.14 mg/dL — ABNORMAL HIGH (ref 0.57–1.00)
GFR calc Af Amer: 52 mL/min/{1.73_m2} — ABNORMAL LOW (ref >59)
GFR calc non Af Amer: 45 mL/min/{1.73_m2} — ABNORMAL LOW (ref >59)
Globulin, Total: 2.3 g/dL (ref 1.5–4.5)
Glucose: 92 mg/dL (ref 65–99)
Potassium: 4.1 mmol/L (ref 3.5–5.2)
Sodium: 143 mmol/L (ref 134–144)
Total Protein: 6.5 g/dL (ref 6.0–8.5)

## 2016-04-21 LAB — TSH: TSH: 1.73 u[IU]/mL (ref 0.450–4.500)

## 2016-04-21 NOTE — Progress Notes (Signed)
Pt notified by letter

## 2016-05-06 ENCOUNTER — Telehealth: Payer: Self-pay | Admitting: *Deleted

## 2016-05-06 NOTE — Telephone Encounter (Signed)
Pt states it has been over a month since her test and she would like the results. Informed pt Dr. Jacqualyn Posey had reviewed fungal culture 03/09/2016 as + and she needed an appt to discuss treatment.

## 2016-05-18 ENCOUNTER — Ambulatory Visit (INDEPENDENT_AMBULATORY_CARE_PROVIDER_SITE_OTHER): Payer: Medicare Other | Admitting: Unknown Physician Specialty

## 2016-05-18 ENCOUNTER — Encounter: Payer: Self-pay | Admitting: Unknown Physician Specialty

## 2016-05-18 DIAGNOSIS — F419 Anxiety disorder, unspecified: Secondary | ICD-10-CM

## 2016-05-18 DIAGNOSIS — F32A Depression, unspecified: Secondary | ICD-10-CM

## 2016-05-18 DIAGNOSIS — F418 Other specified anxiety disorders: Secondary | ICD-10-CM | POA: Diagnosis not present

## 2016-05-18 DIAGNOSIS — I129 Hypertensive chronic kidney disease with stage 1 through stage 4 chronic kidney disease, or unspecified chronic kidney disease: Secondary | ICD-10-CM

## 2016-05-18 DIAGNOSIS — F329 Major depressive disorder, single episode, unspecified: Secondary | ICD-10-CM

## 2016-05-18 MED ORDER — SERTRALINE HCL 50 MG PO TABS: 25.0000 mg | ORAL_TABLET | Freq: Every day | ORAL | 1 refills | Status: DC

## 2016-05-18 NOTE — Assessment & Plan Note (Signed)
Under good control 

## 2016-05-18 NOTE — Assessment & Plan Note (Signed)
DC Citalopram as not tolerating.  Start Sertraline daily.

## 2016-05-18 NOTE — Progress Notes (Signed)
BP 138/62 (BP Location: Left Arm, Patient Position: Sitting, Cuff Size: Large)   Pulse (!) 58   Temp 98 F (36.7 C)   Wt 203 lb 12.8 oz (92.4 kg) Comment: pt had shoes on  LMP  (LMP Unknown)   SpO2 98%   BMI 35.54 kg/m    Subjective:    Patient ID: Veronica Colon, female    DOB: 1932-03-25, 82 y.o.   MRN: [id number]  HPI: Veronica Colon is a 82 y.o. female  Chief Complaint  Patient presents with  . Follow-up    pt states she is just here for a f/up, states she could not take the citalopram   Anxiety Pt states she could not take the Citalopram due to dizzyness  She states "I just need something for my nerves."  EKGs have been nomral.      Hypertension Elevated last visit but under control today.  Tolerating medications well  Relevant past medical, surgical, family and social history reviewed and updated as indicated. Interim medical history since our last visit reviewed. Allergies and medications reviewed and updated.  Review of Systems  Per HPI unless specifically indicated above     Objective:    BP 138/62 (BP Location: Left Arm, Patient Position: Sitting, Cuff Size: Large)   Pulse (!) 58   Temp 98 F (36.7 C)   Wt 203 lb 12.8 oz (92.4 kg) Comment: pt had shoes on  LMP  (LMP Unknown)   SpO2 98%   BMI 35.54 kg/m   Wt Readings from Last 3 Encounters:  05/18/16 203 lb 12.8 oz (92.4 kg)  04/20/16 202 lb 12.8 oz (92 kg)  10/13/15 206 lb 12.8 oz (93.8 kg)    Physical Exam  Constitutional: She is oriented to person, place, and time. She appears well-developed and well-nourished. No distress.  HENT:  Head: Normocephalic and atraumatic.  Eyes: Conjunctivae and lids are normal. Right eye exhibits no discharge. Left eye exhibits no discharge. No scleral icterus.  Cardiovascular: Normal rate.   Pulmonary/Chest: Effort normal.  Abdominal: Normal appearance. There is no splenomegaly or hepatomegaly.  Musculoskeletal: Normal range of motion.  Neurological: She is alert  and oriented to person, place, and time.  Skin: Skin is intact. No rash noted. No pallor.  Psychiatric: She has a normal mood and affect. Her behavior is normal. Judgment and thought content normal.  Nursing note and vitals reviewed.   Results for orders placed or performed in visit on 04/20/16  Comprehensive metabolic panel  Result Value Ref Range   Glucose 92 65 - 99 mg/dL   BUN 17 8 - 27 mg/dL   Creatinine, Ser 1.14 (H) 0.57 - 1.00 mg/dL   GFR calc non Af Amer 45 (L) >59 mL/min/1.73   GFR calc Af Amer 52 (L) >59 mL/min/1.73   BUN/Creatinine Ratio 15 12 - 28   Sodium 143 134 - 144 mmol/L   Potassium 4.1 3.5 - 5.2 mmol/L   Chloride 102 96 - 106 mmol/L   CO2 27 18 - 29 mmol/L   Calcium 9.4 8.7 - 10.3 mg/dL   Total Protein 6.5 6.0 - 8.5 g/dL   Albumin 4.2 3.5 - 4.7 g/dL   Globulin, Total 2.3 1.5 - 4.5 g/dL   Albumin/Globulin Ratio 1.8 1.2 - 2.2   Bilirubin Total 0.4 0.0 - 1.2 mg/dL   Alkaline Phosphatase 83 39 - 117 IU/L   AST 19 0 - 40 IU/L   ALT 12 0 - 32 IU/L  Lipid  Panel w/o Chol/HDL Ratio  Result Value Ref Range   Cholesterol, Total 218 (H) 100 - 199 mg/dL   Triglycerides 144 0 - 149 mg/dL   HDL 75 >39 mg/dL   VLDL Cholesterol Cal 29 5 - 40 mg/dL   LDL Calculated 114 (H) 0 - 99 mg/dL  TSH  Result Value Ref Range   TSH 1.730 0.450 - 4.500 uIU/mL  CBC with Differential/Platelet  Result Value Ref Range   WBC 6.0 3.4 - 10.8 x10E3/uL   RBC 4.57 3.77 - 5.28 x10E6/uL   Hemoglobin 12.4 11.1 - 15.9 g/dL   Hematocrit 38.8 34.0 - 46.6 %   MCV 85 79 - 97 fL   MCH 27.1 26.6 - 33.0 pg   MCHC 32.0 31.5 - 35.7 g/dL   RDW 14.5 12.3 - 15.4 %   Platelets 218 150 - 379 x10E3/uL   Neutrophils 65 Not Estab. %   Lymphs 25 Not Estab. %   Monocytes 8 Not Estab. %   Eos 2 Not Estab. %   Basos 0 Not Estab. %   Neutrophils Absolute 3.9 1.4 - 7.0 x10E3/uL   Lymphocytes Absolute 1.5 0.7 - 3.1 x10E3/uL   Monocytes Absolute 0.5 0.1 - 0.9 x10E3/uL   EOS (ABSOLUTE) 0.1 0.0 - 0.4 x10E3/uL     Basophils Absolute 0.0 0.0 - 0.2 x10E3/uL   Immature Granulocytes 0 Not Estab. %   Immature Grans (Abs) 0.0 0.0 - 0.1 x10E3/uL      Assessment & Plan:   Problem List Items Addressed This Visit      Unprioritized   Anxiety and depression    DC Citalopram as not tolerating.  Start Sertraline daily.        Relevant Medications   sertraline (ZOLOFT) 50 MG tablet   Hypertensive CKD (chronic kidney disease)    Under good control          Follow up plan: Return in about 4 weeks (around 06/15/2016).

## 2016-05-27 ENCOUNTER — Ambulatory Visit (INDEPENDENT_AMBULATORY_CARE_PROVIDER_SITE_OTHER): Payer: Medicare Other | Admitting: Podiatry

## 2016-05-27 ENCOUNTER — Encounter: Payer: Self-pay | Admitting: Podiatry

## 2016-05-27 DIAGNOSIS — M79675 Pain in left toe(s): Secondary | ICD-10-CM

## 2016-05-27 DIAGNOSIS — M79674 Pain in right toe(s): Secondary | ICD-10-CM

## 2016-05-27 DIAGNOSIS — B351 Tinea unguium: Secondary | ICD-10-CM

## 2016-05-27 DIAGNOSIS — L818 Other specified disorders of pigmentation: Secondary | ICD-10-CM | POA: Diagnosis not present

## 2016-05-27 NOTE — Progress Notes (Signed)
Subjective: 82 year old female presents the also discussed nail culture results. She states that her nails have been thick and discolored for several years and she has not is any change in the color of her toenails. She denies any pain. Denies any redness or drainage or any swelling. Denies any systemic complaints such as fevers, chills, nausea, vomiting. No acute changes since last appointment, and no other complaints at this time.   Objective: AAO x3, NAD DP/PT pulses palpable bilaterally, CRT less than 3 seconds Nails appear to be hypertrophic, dystrophic, discolored how there is no pain there is no surrounding redness or drainage. The nails most affected are left 1, 4, 5, and right 1st. No open lesions or pre-ulcerative lesions.  No pain with calf compression, swelling, warmth, erythema  Assessment:  Onychomycosis, melanin pigment toenail  Plan: -All treatment options discussed with the patient including all alternatives, risks, complications.  -No cultures results were discussed the patient. At further biopsy the toenails today of the most symptomatic nails to evaluate for onychomycosis as well as for the pigment patient changes. Overall upon debridement the toenails there is no extension of hyperpigmentation into the underlying nail bed and there is no extension of hyperpigmentation into the surrounding skin. This appears to be benign however we will further biopsy. She states that she has "not going to worry about this at my age". We will likely clinically watch this however did further biopsy them today to evaluate. Discussed dermatology consult pending new results.  -Ordered compound cream for onychomcyosis through [facility].  -Patient encouraged to call the office with any questions, concerns, change in symptoms.   Celesta Gentile, DPM

## 2016-06-02 ENCOUNTER — Encounter: Payer: Self-pay | Admitting: Unknown Physician Specialty

## 2016-06-15 ENCOUNTER — Ambulatory Visit: Payer: Medicare Other | Admitting: Unknown Physician Specialty

## 2016-06-17 ENCOUNTER — Telehealth: Payer: Self-pay | Admitting: *Deleted

## 2016-06-17 NOTE — Telephone Encounter (Addendum)
-----   Message from Trula Slade, DPM sent at 06/16/2016  4:46 PM EST ----- All the nails biopsied show melanin pigment in the nails and fungus. As her nails have been this color for several years and unchanged and there is no extension of hyperpigmentation into the surrounding skin, this pigment change is likely chronic and from fungus as opposed to a melanoma. We will watch but if there are nay sudden changes to call the office. 06/17/2016-Phone busy. 06/21/2016-Informed pt of Dr. Leigh Aurora review of 05/27/2016 fungal culture, as "All nails biopsied shows intra-melanin pigment and fungus. Pigment change is likely from fungus." I offered [facility] Onychomycosis nail Lacquer and pt states she is currently using it.

## 2016-06-18 ENCOUNTER — Encounter: Payer: Self-pay | Admitting: Unknown Physician Specialty

## 2016-06-18 ENCOUNTER — Ambulatory Visit (INDEPENDENT_AMBULATORY_CARE_PROVIDER_SITE_OTHER): Payer: Medicare Other | Admitting: Unknown Physician Specialty

## 2016-06-18 DIAGNOSIS — F419 Anxiety disorder, unspecified: Secondary | ICD-10-CM

## 2016-06-18 DIAGNOSIS — F418 Other specified anxiety disorders: Secondary | ICD-10-CM | POA: Diagnosis not present

## 2016-06-18 DIAGNOSIS — F329 Major depressive disorder, single episode, unspecified: Secondary | ICD-10-CM

## 2016-06-18 MED ORDER — SERTRALINE HCL 50 MG PO TABS: 25.0000 mg | ORAL_TABLET | Freq: Every day | ORAL | 5 refills | Status: DC

## 2016-06-18 NOTE — Progress Notes (Signed)
BP 127/70 (BP Location: Right Arm, Patient Position: Sitting, Cuff Size: Large)   Pulse (!) 58   Temp 98.2 F (36.8 C)   Wt 203 lb 9.6 oz (92.4 kg)   LMP  (LMP Unknown)   SpO2 98%   BMI 35.50 kg/m    Subjective:    Patient ID: Veronica Colon, female    DOB: 1932/06/01, 82 y.o.   MRN: [id number]  HPI: Veronica Colon is a 82 y.o. female  Chief Complaint  Patient presents with  . Depression    4 week f/up   Anxiety Pt is taking Sertraline for her anxiety.  States "I feel a lot better." Depression screen [facility] 2/9 06/18/2016 05/18/2016 04/20/2016 04/14/2015  Decreased Interest 0 1 2 0  Down, Depressed, Hopeless 0 2 2 0  PHQ - 2 Score 0 3 4 0  Altered sleeping - 0 0 -  Tired, decreased energy - 0 1 -  Change in appetite - 0 0 -  Feeling bad or failure about yourself  - 1 1 -  Trouble concentrating - 0 1 -  Moving slowly or fidgety/restless - 0 1 -  Suicidal thoughts - 0 0 -  PHQ-9 Score - 4 8 -     Relevant past medical, surgical, family and social history reviewed and updated as indicated. Interim medical history since our last visit reviewed. Allergies and medications reviewed and updated.  Review of Systems  Per HPI unless specifically indicated above     Objective:    BP 127/70 (BP Location: Right Arm, Patient Position: Sitting, Cuff Size: Large)   Pulse (!) 58   Temp 98.2 F (36.8 C)   Wt 203 lb 9.6 oz (92.4 kg)   LMP  (LMP Unknown)   SpO2 98%   BMI 35.50 kg/m   Wt Readings from Last 3 Encounters:  06/18/16 203 lb 9.6 oz (92.4 kg)  05/18/16 203 lb 12.8 oz (92.4 kg)  04/20/16 202 lb 12.8 oz (92 kg)    Physical Exam  Constitutional: She is oriented to person, place, and time. She appears well-developed and well-nourished. No distress.  HENT:  Head: Normocephalic and atraumatic.  Eyes: Conjunctivae and lids are normal. Right eye exhibits no discharge. Left eye exhibits no discharge. No scleral icterus.  Neck: Normal range of motion. Neck supple. No JVD  present. Carotid bruit is not present.  Cardiovascular: Normal rate, regular rhythm and normal heart sounds.   Pulmonary/Chest: Effort normal and breath sounds normal.  Abdominal: Normal appearance. There is no splenomegaly or hepatomegaly.  Musculoskeletal: Normal range of motion.  Neurological: She is alert and oriented to person, place, and time.  Skin: Skin is warm, dry and intact. No rash noted. No pallor.  Psychiatric: She has a normal mood and affect. Her behavior is normal. Judgment and thought content normal.    Results for orders placed or performed in visit on 04/20/16  Comprehensive metabolic panel  Result Value Ref Range   Glucose 92 65 - 99 mg/dL   BUN 17 8 - 27 mg/dL   Creatinine, Ser 1.14 (H) 0.57 - 1.00 mg/dL   GFR calc non Af Amer 45 (L) >59 mL/min/1.73   GFR calc Af Amer 52 (L) >59 mL/min/1.73   BUN/Creatinine Ratio 15 12 - 28   Sodium 143 134 - 144 mmol/L   Potassium 4.1 3.5 - 5.2 mmol/L   Chloride 102 96 - 106 mmol/L   CO2 27 18 - 29 mmol/L   Calcium 9.4  8.7 - 10.3 mg/dL   Total Protein 6.5 6.0 - 8.5 g/dL   Albumin 4.2 3.5 - 4.7 g/dL   Globulin, Total 2.3 1.5 - 4.5 g/dL   Albumin/Globulin Ratio 1.8 1.2 - 2.2   Bilirubin Total 0.4 0.0 - 1.2 mg/dL   Alkaline Phosphatase 83 39 - 117 IU/L   AST 19 0 - 40 IU/L   ALT 12 0 - 32 IU/L  Lipid Panel w/o Chol/HDL Ratio  Result Value Ref Range   Cholesterol, Total 218 (H) 100 - 199 mg/dL   Triglycerides 144 0 - 149 mg/dL   HDL 75 >39 mg/dL   VLDL Cholesterol Cal 29 5 - 40 mg/dL   LDL Calculated 114 (H) 0 - 99 mg/dL  TSH  Result Value Ref Range   TSH 1.730 0.450 - 4.500 uIU/mL  CBC with Differential/Platelet  Result Value Ref Range   WBC 6.0 3.4 - 10.8 x10E3/uL   RBC 4.57 3.77 - 5.28 x10E6/uL   Hemoglobin 12.4 11.1 - 15.9 g/dL   Hematocrit 38.8 34.0 - 46.6 %   MCV 85 79 - 97 fL   MCH 27.1 26.6 - 33.0 pg   MCHC 32.0 31.5 - 35.7 g/dL   RDW 14.5 12.3 - 15.4 %   Platelets 218 150 - 379 x10E3/uL   Neutrophils 65  Not Estab. %   Lymphs 25 Not Estab. %   Monocytes 8 Not Estab. %   Eos 2 Not Estab. %   Basos 0 Not Estab. %   Neutrophils Absolute 3.9 1.4 - 7.0 x10E3/uL   Lymphocytes Absolute 1.5 0.7 - 3.1 x10E3/uL   Monocytes Absolute 0.5 0.1 - 0.9 x10E3/uL   EOS (ABSOLUTE) 0.1 0.0 - 0.4 x10E3/uL   Basophils Absolute 0.0 0.0 - 0.2 x10E3/uL   Immature Granulocytes 0 Not Estab. %   Immature Grans (Abs) 0.0 0.0 - 0.1 x10E3/uL      Assessment & Plan:   Problem List Items Addressed This Visit      Unprioritized   Anxiety and depression    Continue Sertraline as directed.  Discussed she should take it for at least 6 months      Relevant Medications   sertraline (ZOLOFT) 50 MG tablet       Follow up plan: Return in about 5 months (around 11/16/2016).

## 2016-06-18 NOTE — Assessment & Plan Note (Signed)
Continue Sertraline as directed.  Discussed she should take it for at least 6 months

## 2016-06-30 ENCOUNTER — Other Ambulatory Visit: Payer: Self-pay | Admitting: Unknown Physician Specialty

## 2016-06-30 DIAGNOSIS — F329 Major depressive disorder, single episode, unspecified: Secondary | ICD-10-CM

## 2016-06-30 DIAGNOSIS — F419 Anxiety disorder, unspecified: Secondary | ICD-10-CM

## 2016-06-30 DIAGNOSIS — F32A Depression, unspecified: Secondary | ICD-10-CM

## 2016-08-11 ENCOUNTER — Other Ambulatory Visit: Payer: Self-pay | Admitting: Unknown Physician Specialty

## 2016-08-16 DIAGNOSIS — H43813 Vitreous degeneration, bilateral: Secondary | ICD-10-CM | POA: Diagnosis not present

## 2016-09-21 DIAGNOSIS — M47812 Spondylosis without myelopathy or radiculopathy, cervical region: Secondary | ICD-10-CM | POA: Diagnosis not present

## 2016-09-21 DIAGNOSIS — M542 Cervicalgia: Secondary | ICD-10-CM | POA: Diagnosis not present

## 2016-09-27 DIAGNOSIS — M542 Cervicalgia: Secondary | ICD-10-CM | POA: Diagnosis not present

## 2016-09-27 DIAGNOSIS — J069 Acute upper respiratory infection, unspecified: Secondary | ICD-10-CM | POA: Diagnosis not present

## 2016-10-06 ENCOUNTER — Encounter: Payer: Self-pay | Admitting: Unknown Physician Specialty

## 2016-10-06 ENCOUNTER — Ambulatory Visit (INDEPENDENT_AMBULATORY_CARE_PROVIDER_SITE_OTHER): Payer: Medicare Other | Admitting: Unknown Physician Specialty

## 2016-10-06 DIAGNOSIS — M542 Cervicalgia: Secondary | ICD-10-CM

## 2016-10-06 DIAGNOSIS — S0300XA Dislocation of jaw, unspecified side, initial encounter: Secondary | ICD-10-CM | POA: Diagnosis not present

## 2016-10-06 MED ORDER — BACLOFEN 20 MG PO TABS: 20.0000 mg | ORAL_TABLET | Freq: Every evening | ORAL | 0 refills | Status: DC | PRN

## 2016-10-06 NOTE — Progress Notes (Signed)
BP 129/66 (BP Location: Left Arm, Patient Position: Sitting, Cuff Size: Large)   Pulse 65   Temp 98.4 F (36.9 C)   Wt 200 lb 6.4 oz (90.9 kg)   LMP  (LMP Unknown)   SpO2 96%   BMI 34.94 kg/m    Subjective:    Patient ID: Veronica Colon, female    DOB: [birth year], 83 y.o.   MRN: [id number]  HPI: Veronica Colon is a 83 y.o. female  Chief Complaint  Patient presents with  . Neck Pain    pt states she has been having stiffness in her neck for about 2 months, states she went to urgent care for this and was given medications (prednisone and baclofen) but neck started hurting againafter finishing meds   Neck pain Pt states she has neck pain and pressure for 2 months.  She was given Baclofen and Prednisone and told she had arthritis in her neck.  However, the pain is in her forehead and down the sides of her face and she thinks it's sinus.    Relevant past medical, surgical, family and social history reviewed and updated as indicated. Interim medical history since our last visit reviewed. Allergies and medications reviewed and updated.  Review of Systems  Per HPI unless specifically indicated above     Objective:    BP 129/66 (BP Location: Left Arm, Patient Position: Sitting, Cuff Size: Large)   Pulse 65   Temp 98.4 F (36.9 C)   Wt 200 lb 6.4 oz (90.9 kg)   LMP  (LMP Unknown)   SpO2 96%   BMI 34.94 kg/m   Wt Readings from Last 3 Encounters:  10/06/16 200 lb 6.4 oz (90.9 kg)  06/18/16 203 lb 9.6 oz (92.4 kg)  05/18/16 203 lb 12.8 oz (92.4 kg)    Physical Exam  Constitutional: She is oriented to person, place, and time. She appears well-developed and well-nourished. No distress.  HENT:  Head: Normocephalic and atraumatic.  Right Ear: Hearing normal.  Left Ear: Hearing normal.  Nose: Nose normal.  Mouth/Throat: Mucous membranes are normal.  TMJ pain  Eyes: Conjunctivae and lids are normal. Right eye exhibits no discharge. Left eye exhibits no discharge. No scleral  icterus.  Neck: Normal range of motion. Neck supple. No JVD present. Carotid bruit is not present.  Cardiovascular: Normal rate, regular rhythm and normal heart sounds.   Pulmonary/Chest: Effort normal and breath sounds normal.  Abdominal: Normal appearance. There is no splenomegaly or hepatomegaly.  Musculoskeletal: Normal range of motion.  Neurological: She is alert and oriented to person, place, and time.  Skin: Skin is warm, dry and intact. No rash noted. No pallor.  Psychiatric: She has a normal mood and affect. Her behavior is normal. Judgment and thought content normal.    Results for orders placed or performed in visit on 04/20/16  Comprehensive metabolic panel  Result Value Ref Range   Glucose 92 65 - 99 mg/dL   BUN 17 8 - 27 mg/dL   Creatinine, Ser 1.14 (H) 0.57 - 1.00 mg/dL   GFR calc non Af Amer 45 (L) >59 mL/min/1.73   GFR calc Af Amer 52 (L) >59 mL/min/1.73   BUN/Creatinine Ratio 15 12 - 28   Sodium 143 134 - 144 mmol/L   Potassium 4.1 3.5 - 5.2 mmol/L   Chloride 102 96 - 106 mmol/L   CO2 27 18 - 29 mmol/L   Calcium 9.4 8.7 - 10.3 mg/dL   Total Protein 6.5 6.0 -  8.5 g/dL   Albumin 4.2 3.5 - 4.7 g/dL   Globulin, Total 2.3 1.5 - 4.5 g/dL   Albumin/Globulin Ratio 1.8 1.2 - 2.2   Bilirubin Total 0.4 0.0 - 1.2 mg/dL   Alkaline Phosphatase 83 39 - 117 IU/L   AST 19 0 - 40 IU/L   ALT 12 0 - 32 IU/L  Lipid Panel w/o Chol/HDL Ratio  Result Value Ref Range   Cholesterol, Total 218 (H) 100 - 199 mg/dL   Triglycerides 144 0 - 149 mg/dL   HDL 75 >39 mg/dL   VLDL Cholesterol Cal 29 5 - 40 mg/dL   LDL Calculated 114 (H) 0 - 99 mg/dL  TSH  Result Value Ref Range   TSH 1.730 0.450 - 4.500 uIU/mL  CBC with Differential/Platelet  Result Value Ref Range   WBC 6.0 3.4 - 10.8 x10E3/uL   RBC 4.57 3.77 - 5.28 x10E6/uL   Hemoglobin 12.4 11.1 - 15.9 g/dL   Hematocrit 38.8 34.0 - 46.6 %   MCV 85 79 - 97 fL   MCH 27.1 26.6 - 33.0 pg   MCHC 32.0 31.5 - 35.7 g/dL   RDW 14.5 12.3 -  15.4 %   Platelets 218 150 - 379 x10E3/uL   Neutrophils 65 Not Estab. %   Lymphs 25 Not Estab. %   Monocytes 8 Not Estab. %   Eos 2 Not Estab. %   Basos 0 Not Estab. %   Neutrophils Absolute 3.9 1.4 - 7.0 x10E3/uL   Lymphocytes Absolute 1.5 0.7 - 3.1 x10E3/uL   Monocytes Absolute 0.5 0.1 - 0.9 x10E3/uL   EOS (ABSOLUTE) 0.1 0.0 - 0.4 x10E3/uL   Basophils Absolute 0.0 0.0 - 0.2 x10E3/uL   Immature Granulocytes 0 Not Estab. %   Immature Grans (Abs) 0.0 0.0 - 0.1 x10E3/uL      Assessment & Plan:   Problem List Items Addressed This Visit      Unprioritized   Cervical pain    Myofascial pain which is a continued pain.  Rx for a muscle relaxant at night.  Recommended massage therapy and chiropractor.        TMJ (dislocation of temporomandibular joint)    Discussed seeing chiropractor          Follow up plan: Return if symptoms worsen or fail to improve.

## 2016-10-06 NOTE — Assessment & Plan Note (Addendum)
Myofascial pain which is a continued pain.  Rx for a muscle relaxant at night.  Recommended massage therapy and chiropractor.

## 2016-10-06 NOTE — Patient Instructions (Addendum)
Dr. Joyce Copa - Chiropractor   Dentist Massage therapy  Temporomandibular Joint Syndrome Temporomandibular joint (TMJ) syndrome is a condition that affects the joints between your jaw and your skull. The TMJs are located near your ears and allow your jaw to open and close. These joints and the nearby muscles are involved in all movements of the jaw. People with TMJ syndrome have pain in the area of these joints and muscles. Chewing, biting, or other movements of the jaw can be difficult or painful. TMJ syndrome can be caused by various things. In many cases, the condition is mild and goes away within a few weeks. For some people, the condition can become a long-term problem. What are the causes? Possible causes of TMJ syndrome include:  Grinding your teeth or clenching your jaw. Some people do this when they are under stress.  Arthritis.  Injury to the jaw.  Head or neck injury.  Teeth or dentures that are not aligned well. In some cases, the cause of TMJ syndrome may not be known. What are the signs or symptoms? The most common symptom is an aching pain on the side of the head in the area of the TMJ. Other symptoms may include:  Pain when moving your jaw, such as when chewing or biting.  Being unable to open your jaw all the way.  Making a clicking sound when you open your mouth.  Headache.  Earache.  Neck or shoulder pain. How is this diagnosed? Diagnosis can usually be made based on your symptoms, your medical history, and a physical exam. Your health care provider may check the range of motion of your jaw. Imaging tests, such as X-rays or an MRI, are sometimes done. You may need to see your dentist to determine if your teeth and jaw are lined up correctly. How is this treated? TMJ syndrome often goes away on its own. If treatment is needed, the options may include:  Eating soft foods and applying ice or heat.  Medicines to relieve pain or inflammation.  Medicines to relax  the muscles.  A splint, bite plate, or mouthpiece to prevent teeth grinding or jaw clenching.  Relaxation techniques or counseling to help reduce stress.  Transcutaneous electrical nerve stimulation (TENS). This helps to relieve pain by applying an electrical current through the skin.  Acupuncture. This is sometimes helpful to relieve pain.  Jaw surgery. This is rarely needed. Follow these instructions at home:  Take medicines only as directed by your health care provider.  Eat a soft diet if you are having trouble chewing.  Apply ice to the painful area.  Put ice in a plastic bag.  Place a towel between your skin and the bag.  Leave the ice on for 20 minutes, 2-3 times a day.  Apply a warm compress to the painful area as directed.  Massage your jaw area and perform any jaw stretching exercises as recommended by your health care provider.  If you were given a mouthpiece or bite plate, wear it as directed.  Avoid foods that require a lot of chewing. Do not chew gum.  Keep all follow-up visits as directed by your health care provider. This is important. Contact a health care provider if:  You are having trouble eating.  You have new or worsening symptoms. Get help right away if:  Your jaw locks open or closed. This information is not intended to replace advice given to you by your health care provider. Make sure you discuss any questions you  have with your health care provider. Document Released: 03/23/2001 Document Revised: 02/26/2016 Document Reviewed: 01/31/2014 Elsevier Interactive Patient Education  2017 [employer]  Cervical Strain and Sprain Rehab Ask your health care provider which exercises are safe for you. Do exercises exactly as told by your health care provider and adjust them as directed. It is normal to feel mild stretching, pulling, tightness, or discomfort as you do these exercises, but you should stop right away if you feel sudden pain or your pain  gets worse.Do not begin these exercises until told by your health care provider. Stretching and range of motion exercises These exercises warm up your muscles and joints and improve the movement and flexibility of your neck. These exercises also help to relieve pain, numbness, and tingling. Exercise A: Cervical side bend   1. Using good posture, sit on a stable chair or stand up. 2. Without moving your shoulders, slowly tilt your left / right ear to your shoulder until you feel a stretch in your neck muscles. You should be looking straight ahead. 3. Hold for __________ seconds. 4. Repeat with the other side of your neck. Repeat __________ times. Complete this exercise __________ times a day. Exercise B: Cervical rotation   1. Using good posture, sit on a stable chair or stand up. 2. Slowly turn your head to the side as if you are looking over your left / right shoulder.  Keep your eyes level with the ground.  Stop when you feel a stretch along the side and the back of your neck. 3. Hold for __________ seconds. 4. Repeat this by turning to your other side. Repeat __________ times. Complete this exercise __________ times a day. Exercise C: Thoracic extension and pectoral stretch  1. Roll a towel or a small blanket so it is about 4 inches (10 cm) in diameter. 2. Lie down on your back on a firm surface. 3. Put the towel lengthwise, under your spine in the middle of your back. It should not be not under your shoulder blades. The towel should line up with your spine from your middle back to your lower back. 4. Put your hands behind your head and let your elbows fall out to your sides. 5. Hold for __________ seconds. Repeat __________ times. Complete this exercise __________ times a day. Strengthening exercises These exercises build strength and endurance in your neck. Endurance is the ability to use your muscles for a long time, even after your muscles get tired. Exercise D: Upper cervical  flexion, isometric  1. Lie on your back with a thin pillow behind your head and a small rolled-up towel under your neck. 2. Gently tuck your chin toward your chest and nod your head down to look toward your feet. Do not lift your head off the pillow. 3. Hold for __________ seconds. 4. Release the tension slowly. Relax your neck muscles completely before you repeat this exercise. Repeat __________ times. Complete this exercise __________ times a day. Exercise E: Cervical extension, isometric   1. Stand about 6 inches (15 cm) away from a wall, with your back facing the wall. 2. Place a soft object, about 6-8 inches (15-20 cm) in diameter, between the back of your head and the wall. A soft object could be a small pillow, a ball, or a folded towel. 3. Gently tilt your head back and press into the soft object. Keep your jaw and forehead relaxed. 4. Hold for __________ seconds. 5. Release the tension slowly. Relax your neck muscles  completely before you repeat this exercise. Repeat __________ times. Complete this exercise __________ times a day. Posture and body mechanics   Body mechanics refers to the movements and positions of your body while you do your daily activities. Posture is part of body mechanics. Good posture and healthy body mechanics can help to relieve stress in your body's tissues and joints. Good posture means that your spine is in its natural S-curve position (your spine is neutral), your shoulders are pulled back slightly, and your head is not tipped forward. The following are general guidelines for applying improved posture and body mechanics to your everyday activities. Standing   When standing, keep your spine neutral and keep your feet about hip-width apart. Keep a slight bend in your knees. Your ears, shoulders, and hips should line up.  When you do a task in which you stand in one place for a long time, place one foot up on a stable object that is 2-4 inches (5-10 cm) high,  such as a footstool. This helps keep your spine neutral. Sitting    When sitting, keep your spine neutral and your keep feet flat on the floor. Use a footrest, if necessary, and keep your thighs parallel to the floor. Avoid rounding your shoulders, and avoid tilting your head forward.  When working at a desk or a computer, keep your desk at a height where your hands are slightly lower than your elbows. Slide your chair under your desk so you are close enough to maintain good posture.  When working at a computer, place your monitor at a height where you are looking straight ahead and you do not have to tilt your head forward or downward to look at the screen. Resting  When lying down and resting, avoid positions that are most painful for you. Try to support your neck in a neutral position. You can use a contour pillow or a small rolled-up towel. Your pillow should support your neck but not push on it. This information is not intended to replace advice given to you by your health care provider. Make sure you discuss any questions you have with your health care provider. Document Released: 06/28/2005 Document Revised: 03/04/2016 Document Reviewed: 06/04/2015 Elsevier Interactive Patient Education  2017 [employer].

## 2016-10-06 NOTE — Assessment & Plan Note (Signed)
Discussed seeing chiropractor

## 2016-10-09 ENCOUNTER — Other Ambulatory Visit: Payer: Self-pay | Admitting: Unknown Physician Specialty

## 2016-10-09 DIAGNOSIS — F329 Major depressive disorder, single episode, unspecified: Secondary | ICD-10-CM

## 2016-10-09 DIAGNOSIS — F419 Anxiety disorder, unspecified: Secondary | ICD-10-CM

## 2016-10-16 ENCOUNTER — Emergency Department: Payer: Medicare Other

## 2016-10-16 ENCOUNTER — Encounter: Payer: Self-pay | Admitting: Emergency Medicine

## 2016-10-16 ENCOUNTER — Emergency Department
Admission: EM | Admit: 2016-10-16 | Discharge: 2016-10-16 | Disposition: A | Discharge status: Home / Self Care | Payer: Medicare Other | Attending: Emergency Medicine | Admitting: Emergency Medicine

## 2016-10-16 DIAGNOSIS — Z79899 Other long term (current) drug therapy: Secondary | ICD-10-CM | POA: Insufficient documentation

## 2016-10-16 DIAGNOSIS — R51 Headache: Secondary | ICD-10-CM

## 2016-10-16 DIAGNOSIS — R519 Headache, unspecified: Secondary | ICD-10-CM

## 2016-10-16 DIAGNOSIS — M316 Other giant cell arteritis: Secondary | ICD-10-CM | POA: Diagnosis not present

## 2016-10-16 DIAGNOSIS — M542 Cervicalgia: Secondary | ICD-10-CM | POA: Diagnosis not present

## 2016-10-16 DIAGNOSIS — N183 Chronic kidney disease, stage 3 (moderate): Secondary | ICD-10-CM | POA: Diagnosis not present

## 2016-10-16 DIAGNOSIS — Z7982 Long term (current) use of aspirin: Secondary | ICD-10-CM | POA: Diagnosis not present

## 2016-10-16 DIAGNOSIS — I129 Hypertensive chronic kidney disease with stage 1 through stage 4 chronic kidney disease, or unspecified chronic kidney disease: Secondary | ICD-10-CM | POA: Insufficient documentation

## 2016-10-16 DIAGNOSIS — Z853 Personal history of malignant neoplasm of breast: Secondary | ICD-10-CM | POA: Diagnosis not present

## 2016-10-16 LAB — BASIC METABOLIC PANEL
Anion gap: 10 (ref 5–15)
BUN: 12 mg/dL (ref 6–20)
CO2: 28 mmol/L (ref 22–32)
Calcium: 9.3 mg/dL (ref 8.9–10.3)
Chloride: 101 mmol/L (ref 101–111)
Creatinine, Ser: 0.89 mg/dL (ref 0.44–1.00)
GFR calc Af Amer: >60 mL/min (ref >60)
GFR calc non Af Amer: 58 mL/min — ABNORMAL LOW (ref >60)
Glucose, Bld: 136 mg/dL — ABNORMAL HIGH (ref 65–99)
Potassium: 3.6 mmol/L (ref 3.5–5.1)
Sodium: 139 mmol/L (ref 135–145)

## 2016-10-16 LAB — C-REACTIVE PROTEIN: CRP: 18.6 mg/dL — ABNORMAL HIGH (ref <1.0)

## 2016-10-16 LAB — CBC
HCT: 34.3 % — ABNORMAL LOW (ref 35.0–47.0)
Hemoglobin: 11 g/dL — ABNORMAL LOW (ref 12.0–16.0)
MCH: 26.4 pg (ref 26.0–34.0)
MCHC: 32 g/dL (ref 32.0–36.0)
MCV: 82.6 fL (ref 80.0–100.0)
Platelets: 366 10*3/uL (ref 150–440)
RBC: 4.16 MIL/uL (ref 3.80–5.20)
RDW: 13.7 % (ref 11.5–14.5)
WBC: 8 10*3/uL (ref 3.6–11.0)

## 2016-10-16 LAB — SEDIMENTATION RATE: Sed Rate: 118 mm/hr — ABNORMAL HIGH (ref 0–30)

## 2016-10-16 MED ORDER — PREDNISONE 20 MG PO TABS
40.0000 mg | ORAL_TABLET | Freq: Once | ORAL | Status: AC
  Administered 2016-10-16: 40 mg via ORAL
  Filled 2016-10-16: qty 2

## 2016-10-16 MED ORDER — PREDNISONE 20 MG PO TABS
20.0000 mg | ORAL_TABLET | Freq: Once | ORAL | Status: AC
  Administered 2016-10-16: 20 mg via ORAL
  Filled 2016-10-16: qty 1

## 2016-10-16 MED ORDER — PREDNISONE 20 MG PO TABS: 60.0000 mg | ORAL_TABLET | Freq: Every day | ORAL | 0 refills | Status: DC

## 2016-10-16 MED ORDER — ACETAMINOPHEN 500 MG PO TABS
1000.0000 mg | ORAL_TABLET | ORAL | Status: AC
  Administered 2016-10-16: 1000 mg via ORAL
  Filled 2016-10-16: qty 2

## 2016-10-16 MED ORDER — GABAPENTIN 100 MG PO CAPS
100.0000 mg | ORAL_CAPSULE | Freq: Once | ORAL | Status: AC
  Administered 2016-10-16: 100 mg via ORAL
  Filled 2016-10-16: qty 1

## 2016-10-16 NOTE — ED Notes (Signed)
Patient also c/o right ear pain that occurs upon waking up in the morning and jaw stiffness that occurs at the same time.

## 2016-10-16 NOTE — ED Provider Notes (Signed)
[facility] Emergency Department Provider Note   ____________________________________________   First MD Initiated Contact with Patient 10/16/16 1118     (approximate)  I have reviewed the triage vital signs and the nursing notes.   HISTORY  Chief Complaint Headache    HPI KRUTI HORACEK is a 83 y.o. female here for evaluation of headache. She reports for 2 months she's had a pain at the base of her skull on the right that radiates out over her scalp towards the angle of her jaw and the front of her ear. She described as a sharp burning somewhat hard describe discomfort. She reports it is notably worse in the evening when she lays down, it is somewhat present throughout the day at all times. Not associated fevers or chills. She reports is not really her "neck" this is uncomfortable as much as it seems to be coming from the base of her skull and radiating forward.  No trouble speaking. No numbness or tingling. Reports headache presently has mild to moderate, but reports it severe and causes difficulty sleeping especially in the evening. Denies any fevers. No visual changes.  Patient reports that she was treated with prednisone with good relief about a month and a half ago at urgent care, but pain is returned. She saw her doctor a couple days ago and was placed on baclofen, but reports that she is feeling tired and somewhat dizzy at times and she stating this and think she should stop it.   Past Medical History:  Diagnosis Date  . Allergy   . Breast cancer ([place]) 1990   LT MASTECTOMY  . Cancer ([place]) 1990   LT BREAST MASTECTOMY  . GERD (gastroesophageal reflux disease)   . Hypertension   . Status post chemotherapy    breast cancer    Patient Active Problem List   Diagnosis Date Noted  . Cervical pain 10/06/2016  . TMJ (dislocation of temporomandibular joint) 10/06/2016  . Anxiety and depression 04/20/2016  . Allergic rhinitis 02/26/2015  . H/O  malignant neoplasm of breast 02/26/2015  . H/O malignant neoplasm of colon 02/26/2015  . GERD (gastroesophageal reflux disease) 02/26/2015  . Hypertensive CKD (chronic kidney disease) 02/26/2015  . CKD (chronic kidney disease), stage III 02/26/2015  . Hypokalemia 02/26/2015    Past Surgical History:  Procedure Laterality Date  . ABDOMINAL HYSTERECTOMY    . BREAST SURGERY    . COLONOSCOPY WITH PROPOFOL N/A 06/04/2015   Procedure: COLONOSCOPY WITH PROPOFOL;  Surgeon: Manya Silvas, MD;  Location: [facility] ENDOSCOPY;  Service: Endoscopy;  Laterality: N/A;  . MASTECTOMY Left 1990   BREAST CA    Prior to Admission medications   Medication Sig Start Date End Date Taking? Authorizing Provider  amLODipine (NORVASC) 5 MG tablet Take 1 tablet (5 mg total) by mouth daily. 04/20/16  Yes Kathrine Haddock, NP  aspirin 81 MG tablet Take 81 mg by mouth daily.   Yes Historical Provider, MD  fluticasone (FLONASE) 50 MCG/ACT nasal spray PLACE 2 SPRAYS INTO BOTH NOSTRILS DAILY. 08/11/16  Yes Kathrine Haddock, NP  Multiple Vitamin (MULTIVITAMIN) tablet Take 1 tablet by mouth daily.   Yes Historical Provider, MD  baclofen (LIORESAL) 20 MG tablet Take 1 tablet (20 mg total) by mouth at bedtime as needed for muscle spasms. Patient not taking: Reported on 10/16/2016 10/06/16   Kathrine Haddock, NP  sertraline (ZOLOFT) 50 MG tablet TAKE 0.5 TABLETS (25 MG TOTAL) BY MOUTH DAILY. Patient not taking: Reported on 10/16/2016 10/11/16  Kathrine Haddock, NP    Allergies Hydrochlorothiazide and Lisinopril  Family History  Problem Relation Age of Onset  . Breast cancer Mother 33  . Cancer Mother     breast  . Cancer Father     lung    Social History Social History  Substance Use Topics  . Smoking status: Never Smoker  . Smokeless tobacco: Never Used  . Alcohol use No    Review of Systems Constitutional: No fever/chills Eyes: No visual changes. ENT: No sore throat. Cardiovascular: Denies chest pain. Respiratory:  Denies shortness of breath. Gastrointestinal: No abdominal pain.  No nausea, no vomiting.  No diarrhea.  No constipation. Genitourinary: Negative for dysuria. Musculoskeletal: Negative for back pain. Skin: Negative for rash. Neurological: Negative for headaches, focal weakness or numbness.  10-point ROS otherwise negative.  ____________________________________________   PHYSICAL EXAM:  VITAL SIGNS: ED Triage Vitals  Enc Vitals Group     BP 10/16/16 1105 (!) 142/55     Pulse Rate 10/16/16 1105 78     Resp 10/16/16 1105 20     Temp 10/16/16 1105 98.7 F (37.1 C)     Temp Source 10/16/16 1105 Oral     SpO2 10/16/16 1105 99 %     Weight 10/16/16 1106 200 lb (90.7 kg)     Height 10/16/16 1106 _0  (1.651 m)     Head Circumference --      Peak Flow --      Pain Score 10/16/16 1105 9     Pain Loc --      Pain Edu? --      Excl. in [place]? --     Constitutional: Alert and oriented. Well appearing and in no acute distress.She and daughter very pleasant. Eyes: Conjunctivae are normal. PERRL. EOMI. Head: Atraumatic. Patient reports tenderness and worsening of her pain to palpation at the base of the right occiput. There is palpable temporal artery pulse but seems slightly diminished on the right side With some slight tenderness over the temporal region on the right, strong temporal artery pulsation on the left side with no tenderness over the left temporal region. She has no loss of any visual fields. Denies any blurring or change in vision. Able to distinguish print clearly with both eyes isolated Nose: No congestion/rhinnorhea. No photophobia. Mouth/Throat: Mucous membranes are moist.  Oropharynx non-erythematous. Neck: No stridor.  No cervical tenderness. Cardiovascular: Normal rate, regular rhythm. Grossly normal heart sounds.  Good peripheral circulation. Respiratory: Normal respiratory effort.  No retractions. Lungs CTAB. Gastrointestinal: Soft and nontender. Musculoskeletal: No  lower extremity tenderness nor edema.  No joint effusions. Neurologic:  Normal speech and language. No gross focal neurologic deficits are appreciated. No gait instability. No pronator drift. Skin:  Skin is warm, dry and intact. No rash noted. Psychiatric: Mood and affect are normal. Speech and behavior are normal.  ____________________________________________   LABS (all labs ordered are listed, but only abnormal results are displayed)  Labs Reviewed  SEDIMENTATION RATE - Abnormal; Notable for the following:       Result Value   Sed Rate 118 (*)    All other components within normal limits  CBC - Abnormal; Notable for the following:    Hemoglobin 11.0 (*)    HCT 34.3 (*)    All other components within normal limits  BASIC METABOLIC PANEL - Abnormal; Notable for the following:    Glucose, Bld 136 (*)    GFR calc non Af Amer 58 (*)    All  other components within normal limits  C-REACTIVE PROTEIN   ____________________________________________  EKG   ____________________________________________  RADIOLOGY  Ct Head Wo Contrast  Result Date: 10/16/2016 CLINICAL DATA:  83 year old female with a history of right-sided headache and neck pain for 2 months EXAM: CT HEAD WITHOUT CONTRAST CT CERVICAL SPINE WITHOUT CONTRAST TECHNIQUE: Multidetector CT imaging of the head and cervical spine was performed following the standard protocol without intravenous contrast. Multiplanar CT image reconstructions of the cervical spine were also generated. COMPARISON:  None. FINDINGS: CT HEAD FINDINGS Brain: No acute intracranial hemorrhage. No midline shift or mass effect. Gray-white differentiation maintained. Unremarkable appearance of the ventricular system. Vascular: Unremarkable. Skull: No acute fracture.  No aggressive bone lesion identified. Sinuses/Orbits: Unremarkable appearance of the orbits. Mastoid air cells clear. No middle ear effusion. No significant sinus disease. Other: None CT CERVICAL  SPINE FINDINGS Alignment: Craniocervical junction aligned. Anatomic alignment of the cervical elements. No subluxation. Skull base and vertebrae: No acute fracture at the skullbase. Vertebral body heights relatively maintained. No acute fracture identified. Soft tissues and spinal canal: Unremarkable cervical soft tissues. Lymph nodes are present, though not enlarged. Disc levels: Multilevel degenerative disc disease of the cervical spine, most pronounced at the C4-C7 levels. Mild bilateral uncovertebral joint disease and facet disease without significant canal narrowing or neural foraminal narrowing. Reversal of normal cervical lordosis secondary to degenerative changes. Upper chest: Unremarkable appearance of the lung apices. Other: No bony canal narrowing. IMPRESSION: Head CT: No CT evidence of acute intracranial abnormality. Cervical CT: No CT evidence of acute fracture malalignment of the cervical spine. Multilevel degenerative disc disease, most pronounced at the C4-C7 levels without significant bony canal narrowing and without significant neural foraminal narrowing. Electronically Signed   By: Corrie Mckusick D.O.   On: 10/16/2016 12:18   Ct Cervical Spine Wo Contrast  Result Date: 10/16/2016 CLINICAL DATA:  84 year old female with a history of right-sided headache and neck pain for 2 months EXAM: CT HEAD WITHOUT CONTRAST CT CERVICAL SPINE WITHOUT CONTRAST TECHNIQUE: Multidetector CT imaging of the head and cervical spine was performed following the standard protocol without intravenous contrast. Multiplanar CT image reconstructions of the cervical spine were also generated. COMPARISON:  None. FINDINGS: CT HEAD FINDINGS Brain: No acute intracranial hemorrhage. No midline shift or mass effect. Gray-white differentiation maintained. Unremarkable appearance of the ventricular system. Vascular: Unremarkable. Skull: No acute fracture.  No aggressive bone lesion identified. Sinuses/Orbits: Unremarkable appearance  of the orbits. Mastoid air cells clear. No middle ear effusion. No significant sinus disease. Other: None CT CERVICAL SPINE FINDINGS Alignment: Craniocervical junction aligned. Anatomic alignment of the cervical elements. No subluxation. Skull base and vertebrae: No acute fracture at the skullbase. Vertebral body heights relatively maintained. No acute fracture identified. Soft tissues and spinal canal: Unremarkable cervical soft tissues. Lymph nodes are present, though not enlarged. Disc levels: Multilevel degenerative disc disease of the cervical spine, most pronounced at the C4-C7 levels. Mild bilateral uncovertebral joint disease and facet disease without significant canal narrowing or neural foraminal narrowing. Reversal of normal cervical lordosis secondary to degenerative changes. Upper chest: Unremarkable appearance of the lung apices. Other: No bony canal narrowing. IMPRESSION: Head CT: No CT evidence of acute intracranial abnormality. Cervical CT: No CT evidence of acute fracture malalignment of the cervical spine. Multilevel degenerative disc disease, most pronounced at the C4-C7 levels without significant bony canal narrowing and without significant neural foraminal narrowing. Electronically Signed   By: Corrie Mckusick D.O.   On: 10/16/2016 12:18  ____________________________________________   PROCEDURES  Procedure(s) performed: None  Procedures  Critical Care performed: No  ____________________________________________   INITIAL IMPRESSION / ASSESSMENT AND PLAN / ED COURSE  Pertinent labs & imaging results that were available during my care of the patient were reviewed by me and considered in my medical decision making (see chart for details).  Differential diagnosis includes but is not exclusive to subarachnoid hemorrhage, meningitis, encephalitis, previous head trauma, cavernous venous thrombosis, muscle tension headache, temporal arteritis, migraine or migraine equivalent,  etc. Given her reported symptomatology, chronicity I do not believe this is infectious at all. I also do not believe there is a central lesion causing her pain, but it seems to be peripheral in nature possibly tension headache or occipital neuralgia, and felt less likely temporal artery disease however she does have some stigmata suggestive of this and if the ESR is elevated may be of significant concern. I will send an ESR to screen for risk of giant cell arteritis I feel this is low pretest probability.  ----------------------------------------- 1:10 PM on 10/16/2016 -----------------------------------------  Patient ESR as notably elevated, given the association of right-sided headache, with tenderness over the temporal region without visual deficits I discussed her case with Dr. Doy Mince of neurology, recommends prednisone 60 mg daily for the next 2 weeks with follow-up with primary and/or rheumatology within the week.   Discussed management techniques with the patient. We discussed the risks and benefits of gabapentin, including slightly increased risk of adverse side effects at her age, but given the ongoing nature of her pain she is amenable to trying this. She will stop taking her baclofen as it seems to making her lightheaded. In addition continue with Tylenol therapy over-the-counter, and we'll give a 2 week course of prednisone 60 mg daily. In the interim, she will set up follow-up with rheumatology and her primary doctor for follow-up and repeat testing and further evaluation. She understands my diagnosis of giant cell arteritis is provisional at this time. She understands as well if she is started developing any visual changes fevers, worsening pain or symptoms that she will need to return the emergency Department right away. She is agreeable with this plan        ____________________________________________   FINAL CLINICAL IMPRESSION(S) / ED DIAGNOSES  Final diagnoses:   Unilateral occipital headache  Temporal arteritis (HCC)      NEW MEDICATIONS STARTED DURING THIS VISIT:  New Prescriptions   No medications on file     Note:  This document was prepared using Dragon voice recognition software and may include unintentional dictation errors.     Delman Kitten, MD 10/16/16 1311

## 2016-10-16 NOTE — Discharge Instructions (Signed)
You have been seen in the Emergency Department (ED) for a headache.    It is vitally important that you follow-up with your primary doctor and/or rheumatology within 1 week for repeat blood work and reevaluation including a repeat of her "ESR" and "CRP". If you cannot be seen by primary or rheumatologist for follow-up, please come back to the emergency department or to medicine urgent care for reevaluation and testing.  You will need to be continued on prednisone for a much longer period of time, but I am providing you the first 2 weeks prescription. Again, it is vitally important that she get follow-up with your or doctor or Rheumatology  doctor within the week. Please call on Monday for follow-up  Call your doctor or return to the ED if you have a worsening headache, sudden and severe headache, confusion, slurred speech, facial droop, weakness or numbness in any arm or leg, extreme fatigue, vision problems, or other symptoms that concern you.

## 2016-10-16 NOTE — ED Notes (Signed)
Patient left for CT scan. 

## 2016-10-16 NOTE — ED Triage Notes (Signed)
States has had R sided headache and neck pain x 2 months. During that time has seen PMD who told her he thought she had arthritis. Has not had a CT scan during this time. Denies falls. Denies using blood thinners. Smile symmetrical, grips equal, leg strength equal.

## 2016-10-20 ENCOUNTER — Encounter: Payer: Self-pay | Admitting: Unknown Physician Specialty

## 2016-10-20 ENCOUNTER — Ambulatory Visit (INDEPENDENT_AMBULATORY_CARE_PROVIDER_SITE_OTHER): Payer: Medicare Other | Admitting: Unknown Physician Specialty

## 2016-10-20 VITALS — BP 145/74 | HR 60 | Temp 98.8°F | Wt 199.2 lb

## 2016-10-20 DIAGNOSIS — M316 Other giant cell arteritis: Secondary | ICD-10-CM

## 2016-10-20 NOTE — Progress Notes (Signed)
BP (!) 145/74 (BP Location: Right Arm, Cuff Size: Large)   Pulse 60   Temp 98.8 F (37.1 C)   Wt 199 lb 3.2 oz (90.4 kg)   LMP  (LMP Unknown)   SpO2 99%   BMI 33.15 kg/m    Subjective:    Patient ID: Veronica Colon, female    DOB: August 14, 1931, 83 y.o.   MRN: [id number]  HPI: Veronica Colon is a 83 y.o. female  Chief Complaint  Patient presents with  . ER Follow Up    pt states she was seen at the ER for a headache and pain all over     Pt is here with her daughter for f/u of an ER visit due to pain on the right side of heat.  She was placed on 60 mg of Prednisone after an elevated CRP and a diagnosis of temporal arteritis.  Urgent referral made to the rheumatologist from the ER and her daughter is planning on calling to get an appointment.   Tolerating Prednisone  Relevant past medical, surgical, family and social history reviewed and updated as indicated. Interim medical history since our last visit reviewed. Allergies and medications reviewed and updated.  Review of Systems  Per HPI unless specifically indicated above     Objective:    BP (!) 145/74 (BP Location: Right Arm, Cuff Size: Large)   Pulse 60   Temp 98.8 F (37.1 C)   Wt 199 lb 3.2 oz (90.4 kg)   LMP  (LMP Unknown)   SpO2 99%   BMI 33.15 kg/m   Wt Readings from Last 3 Encounters:  10/20/16 199 lb 3.2 oz (90.4 kg)  10/16/16 200 lb (90.7 kg)  10/06/16 200 lb 6.4 oz (90.9 kg)    Physical Exam  Constitutional: She is oriented to person, place, and time. She appears well-developed and well-nourished. No distress.  HENT:  Head: Normocephalic and atraumatic.  Eyes: Conjunctivae and lids are normal. Right eye exhibits no discharge. Left eye exhibits no discharge. No scleral icterus.  Neck: Normal range of motion. Neck supple. No JVD present. Carotid bruit is not present.  Cardiovascular: Normal rate, regular rhythm and normal heart sounds.   Pulmonary/Chest: Effort normal and breath sounds normal.    Abdominal: Normal appearance. There is no splenomegaly or hepatomegaly.  Musculoskeletal: Normal range of motion.  Neurological: She is alert and oriented to person, place, and time.  Skin: Skin is warm, dry and intact. No rash noted. No pallor.  Psychiatric: She has a normal mood and affect. Her behavior is normal. Judgment and thought content normal.    Results for orders placed or performed during the hospital encounter of 10/16/16  Sedimentation rate  Result Value Ref Range   Sed Rate 118 (H) 0 - 30 mm/hr  CBC  Result Value Ref Range   WBC 8.0 3.6 - 11.0 K/uL   RBC 4.16 3.80 - 5.20 MIL/uL   Hemoglobin 11.0 (L) 12.0 - 16.0 g/dL   HCT 34.3 (L) 35.0 - 47.0 %   MCV 82.6 80.0 - 100.0 fL   MCH 26.4 26.0 - 34.0 pg   MCHC 32.0 32.0 - 36.0 g/dL   RDW 13.7 11.5 - 14.5 %   Platelets 366 150 - 440 K/uL  Basic metabolic panel  Result Value Ref Range   Sodium 139 135 - 145 mmol/L   Potassium 3.6 3.5 - 5.1 mmol/L   Chloride 101 101 - 111 mmol/L   CO2 28 22 - 32  mmol/L   Glucose, Bld 136 (H) 65 - 99 mg/dL   BUN 12 6 - 20 mg/dL   Creatinine, Ser 0.89 0.44 - 1.00 mg/dL   Calcium 9.3 8.9 - 10.3 mg/dL   GFR calc non Af Amer 58 (L) >60 mL/min   GFR calc Af Amer >60 >60 mL/min   Anion gap 10 5 - 15  C-reactive protein  Result Value Ref Range   CRP 18.6 (H) <1.0 mg/dL  -+-----    Assessment & Plan:   Problem List Items Addressed This Visit    None    Visit Diagnoses    Temporal arteritis (HCC)    -  Primary   urgent referral to Dr Kernodle pending.  Check Sed rate and C-reactive protein.  Continue Prednisone   Relevant Orders   Sed Rate (ESR)   C-reactive protein       Follow up plan: Return if symptoms worsen or fail to improve.      

## 2016-10-21 LAB — C-REACTIVE PROTEIN: CRP: 19.8 mg/L — ABNORMAL HIGH (ref 0.0–4.9)

## 2016-10-21 LAB — SEDIMENTATION RATE: Sed Rate: 29 mm/hr (ref 0–40)

## 2016-10-22 ENCOUNTER — Telehealth: Payer: Self-pay | Admitting: Unknown Physician Specialty

## 2016-10-22 NOTE — Telephone Encounter (Signed)
Returned patient's call about labs. See result note from [place].

## 2016-10-22 NOTE — Telephone Encounter (Signed)
Patient states she received a call from someone here she thought it may be about her labs.  She missed the call.   She would like for someone to call her back.  Thanks

## 2016-10-28 DIAGNOSIS — M316 Other giant cell arteritis: Secondary | ICD-10-CM | POA: Diagnosis not present

## 2016-10-28 DIAGNOSIS — R700 Elevated erythrocyte sedimentation rate: Secondary | ICD-10-CM | POA: Diagnosis not present

## 2016-10-28 DIAGNOSIS — Z13820 Encounter for screening for osteoporosis: Secondary | ICD-10-CM | POA: Insufficient documentation

## 2016-10-28 DIAGNOSIS — R7982 Elevated C-reactive protein (CRP): Secondary | ICD-10-CM | POA: Diagnosis not present

## 2016-10-28 HISTORY — DX: Other giant cell arteritis: M31.6

## 2016-11-01 DIAGNOSIS — M316 Other giant cell arteritis: Secondary | ICD-10-CM | POA: Diagnosis not present

## 2016-11-02 ENCOUNTER — Other Ambulatory Visit: Payer: Self-pay | Admitting: Unknown Physician Specialty

## 2016-11-08 DIAGNOSIS — M316 Other giant cell arteritis: Secondary | ICD-10-CM | POA: Diagnosis not present

## 2016-11-09 HISTORY — PX: TEMPORAL ARTERY BIOPSY / LIGATION: SUR132

## 2016-11-16 ENCOUNTER — Ambulatory Visit: Payer: Medicare Other | Admitting: Unknown Physician Specialty

## 2016-11-25 DIAGNOSIS — E559 Vitamin D deficiency, unspecified: Secondary | ICD-10-CM | POA: Diagnosis not present

## 2016-11-28 ENCOUNTER — Other Ambulatory Visit: Payer: Self-pay | Admitting: Unknown Physician Specialty

## 2016-11-29 DIAGNOSIS — M316 Other giant cell arteritis: Secondary | ICD-10-CM | POA: Diagnosis not present

## 2016-11-29 DIAGNOSIS — Z79899 Other long term (current) drug therapy: Secondary | ICD-10-CM | POA: Diagnosis not present

## 2016-11-29 DIAGNOSIS — E559 Vitamin D deficiency, unspecified: Secondary | ICD-10-CM | POA: Diagnosis not present

## 2016-12-27 DIAGNOSIS — Z79899 Other long term (current) drug therapy: Secondary | ICD-10-CM | POA: Diagnosis not present

## 2016-12-27 DIAGNOSIS — M316 Other giant cell arteritis: Secondary | ICD-10-CM | POA: Diagnosis not present

## 2017-01-24 DIAGNOSIS — Z79899 Other long term (current) drug therapy: Secondary | ICD-10-CM | POA: Diagnosis not present

## 2017-01-24 DIAGNOSIS — M316 Other giant cell arteritis: Secondary | ICD-10-CM | POA: Diagnosis not present

## 2017-01-25 DIAGNOSIS — M316 Other giant cell arteritis: Secondary | ICD-10-CM | POA: Diagnosis not present

## 2017-01-25 DIAGNOSIS — R7982 Elevated C-reactive protein (CRP): Secondary | ICD-10-CM | POA: Diagnosis not present

## 2017-01-25 DIAGNOSIS — Z13820 Encounter for screening for osteoporosis: Secondary | ICD-10-CM | POA: Diagnosis not present

## 2017-01-25 DIAGNOSIS — M810 Age-related osteoporosis without current pathological fracture: Secondary | ICD-10-CM | POA: Diagnosis not present

## 2017-01-25 DIAGNOSIS — E559 Vitamin D deficiency, unspecified: Secondary | ICD-10-CM | POA: Diagnosis not present

## 2017-01-25 DIAGNOSIS — R079 Chest pain, unspecified: Secondary | ICD-10-CM | POA: Diagnosis not present

## 2017-01-31 DIAGNOSIS — M810 Age-related osteoporosis without current pathological fracture: Secondary | ICD-10-CM | POA: Diagnosis not present

## 2017-01-31 DIAGNOSIS — Z13820 Encounter for screening for osteoporosis: Secondary | ICD-10-CM | POA: Diagnosis not present

## 2017-02-21 DIAGNOSIS — M316 Other giant cell arteritis: Secondary | ICD-10-CM | POA: Diagnosis not present

## 2017-02-21 DIAGNOSIS — E559 Vitamin D deficiency, unspecified: Secondary | ICD-10-CM | POA: Diagnosis not present

## 2017-02-21 DIAGNOSIS — R7982 Elevated C-reactive protein (CRP): Secondary | ICD-10-CM | POA: Diagnosis not present

## 2017-02-23 ENCOUNTER — Other Ambulatory Visit: Payer: Self-pay | Admitting: Family Medicine

## 2017-02-23 NOTE — Telephone Encounter (Signed)
Routing to provider. No follow up on file. 

## 2017-02-28 DIAGNOSIS — R7982 Elevated C-reactive protein (CRP): Secondary | ICD-10-CM | POA: Diagnosis not present

## 2017-02-28 DIAGNOSIS — M316 Other giant cell arteritis: Secondary | ICD-10-CM | POA: Diagnosis not present

## 2017-03-01 ENCOUNTER — Ambulatory Visit (INDEPENDENT_AMBULATORY_CARE_PROVIDER_SITE_OTHER): Payer: Medicare Other | Admitting: Unknown Physician Specialty

## 2017-03-01 ENCOUNTER — Encounter: Payer: Self-pay | Admitting: Unknown Physician Specialty

## 2017-03-01 DIAGNOSIS — R42 Dizziness and giddiness: Secondary | ICD-10-CM | POA: Diagnosis not present

## 2017-03-01 DIAGNOSIS — T887XXA Unspecified adverse effect of drug or medicament, initial encounter: Secondary | ICD-10-CM | POA: Diagnosis not present

## 2017-03-01 DIAGNOSIS — I129 Hypertensive chronic kidney disease with stage 1 through stage 4 chronic kidney disease, or unspecified chronic kidney disease: Secondary | ICD-10-CM | POA: Diagnosis not present

## 2017-03-01 DIAGNOSIS — N183 Chronic kidney disease, stage 3 unspecified: Secondary | ICD-10-CM

## 2017-03-01 DIAGNOSIS — R600 Localized edema: Secondary | ICD-10-CM | POA: Diagnosis not present

## 2017-03-01 MED ORDER — SPIRONOLACTONE 50 MG PO TABS: 50.0000 mg | ORAL_TABLET | Freq: Every day | ORAL | 1 refills | Status: DC

## 2017-03-01 NOTE — Assessment & Plan Note (Signed)
See Hypertensive plan

## 2017-03-01 NOTE — Assessment & Plan Note (Signed)
Sometimes diuretics help.  Will see if spironalactone helps problem

## 2017-03-01 NOTE — Progress Notes (Signed)
BP (!) 145/76   Pulse 61   Temp 98.1 F (36.7 C)   Ht 5' 3.9" (1.623 m)   Wt 214 lb 4.8 oz (97.2 kg)   LMP  (LMP Unknown)   SpO2 97%   BMI 36.90 kg/m    Subjective:    Patient ID: Veronica Colon, female    DOB: 08-25-1931, 83 y.o.   MRN: [id number]  HPI: Veronica Colon is a 83 y.o. female  Chief Complaint  Patient presents with  . Hypertension  . Depression   Pt is here with her daughter   Hypertension Using medications without difficulty Average home BPs   No problems or lightheadedness No chest pain with exertion or shortness of breath Having a lot of ank- States she is dizzy most of the time shen she goes from sitting to standing or when she looks up.  Feels like the room is spinning.  States she used to have a problem with inner ear and that is what it feels lik  Temporal Arteritis On Prednisone through [facility] rheumatology  Fatigue Sweating a lot and tired since on prednisone.    Not taking Sertraline as she doesn't feel she needs it Depression screen [facility] 2/9 03/01/2017 06/18/2016 05/18/2016 04/20/2016 04/14/2015  Decreased Interest 0 0 1 2 0  Down, Depressed, Hopeless 0 0 2 2 0  PHQ - 2 Score 0 0 3 4 0  Altered sleeping 0 - 0 0 -  Tired, decreased energy 1 - 0 1 -  Change in appetite 0 - 0 0 -  Feeling bad or failure about yourself  0 - 1 1 -  Trouble concentrating 0 - 0 1 -  Moving slowly or fidgety/restless 0 - 0 1 -  Suicidal thoughts 0 - 0 0 -  PHQ-9 Score 1 - 4 8 -    Relevant past medical, surgical, family and social history reviewed and updated as indicated. Interim medical history since our last visit reviewed. Allergies and medications reviewed and updated.  Review of Systems  Per HPI unless specifically indicated above     Objective:    BP (!) 145/76   Pulse 61   Temp 98.1 F (36.7 C)   Ht 5' 3.9" (1.623 m)   Wt 214 lb 4.8 oz (97.2 kg)   LMP  (LMP Unknown)   SpO2 97%   BMI 36.90 kg/m   Wt Readings from Last 3 Encounters:    03/01/17 214 lb 4.8 oz (97.2 kg)  10/20/16 199 lb 3.2 oz (90.4 kg)  10/16/16 200 lb (90.7 kg)    Physical Exam  Constitutional: She is oriented to person, place, and time. She appears well-developed and well-nourished. No distress.  HENT:  Head: Normocephalic and atraumatic.  Eyes: Conjunctivae and lids are normal. Right eye exhibits no discharge. Left eye exhibits no discharge. No scleral icterus.  Neck: Normal range of motion. Neck supple. No JVD present. Carotid bruit is not present.  Cardiovascular: Normal rate, regular rhythm and normal heart sounds.   Pulmonary/Chest: Effort normal and breath sounds normal.  Abdominal: Normal appearance. There is no splenomegaly or hepatomegaly.  Musculoskeletal: Normal range of motion.       Right ankle: She exhibits swelling.       Left ankle: She exhibits swelling.  Bilateral 4 plus pedal edema  Neurological: She is alert and oriented to person, place, and time.  Skin: Skin is warm, dry and intact. No rash noted. No pallor.  Psychiatric: She has  a normal mood and affect. Her behavior is normal. Judgment and thought content normal.    Results for orders placed or performed in visit on 10/20/16  Sed Rate (ESR)  Result Value Ref Range   Sed Rate 29 0 - 40 mm/hr  C-reactive protein  Result Value Ref Range   CRP 19.8 (H) 0.0 - 4.9 mg/L      Assessment & Plan:   Problem List Items Addressed This Visit      Unprioritized   Hypertensive CKD (chronic kidney disease)    Pt with high blood pressure not well controlled on Amlodipine.  Possibly due to Prednisone.  Also with significant ankle edema.  Stop Amlodipine 5 mg.  Put on Spironalactone 25 mg.        Relevant Orders   Basic Metabolic Panel (BMET)   Medication side effects    Discussed many of her issues is due to side-effects of Prednisone.  Discussed risks of coming of of Prednisone and that benefits outweigh risks      Pedal edema    See Hypertensive plan      Vertigo     Sometimes diuretics help.  Will see if spironalactone helps problem          Follow up plan: Return in about 1 week (around 03/08/2017).

## 2017-03-01 NOTE — Assessment & Plan Note (Signed)
Discussed many of her issues is due to side-effects of Prednisone.  Discussed risks of coming of of Prednisone and that benefits outweigh risks

## 2017-03-01 NOTE — Assessment & Plan Note (Addendum)
Pt with high blood pressure not well controlled on Amlodipine.  Possibly due to Prednisone.  Also with significant ankle edema.  Stop Amlodipine 5 mg.  Put on Spironalactone 25 mg.

## 2017-03-01 NOTE — Patient Instructions (Signed)
DC Amlodipine Start Spironalactone.

## 2017-03-02 ENCOUNTER — Encounter: Payer: Self-pay | Admitting: Unknown Physician Specialty

## 2017-03-02 LAB — BASIC METABOLIC PANEL
BUN/Creatinine Ratio: 18 (ref 12–28)
BUN: 17 mg/dL (ref 8–27)
CO2: 22 mmol/L (ref 20–29)
Calcium: 9.3 mg/dL (ref 8.7–10.3)
Chloride: 100 mmol/L (ref 96–106)
Creatinine, Ser: 0.96 mg/dL (ref 0.57–1.00)
GFR calc Af Amer: 63 mL/min/{1.73_m2} (ref >59)
GFR calc non Af Amer: 55 mL/min/{1.73_m2} — ABNORMAL LOW (ref >59)
Glucose: 128 mg/dL — ABNORMAL HIGH (ref 65–99)
Potassium: 4 mmol/L (ref 3.5–5.2)
Sodium: 142 mmol/L (ref 134–144)

## 2017-03-07 DIAGNOSIS — M316 Other giant cell arteritis: Secondary | ICD-10-CM | POA: Diagnosis not present

## 2017-03-07 DIAGNOSIS — R7982 Elevated C-reactive protein (CRP): Secondary | ICD-10-CM | POA: Diagnosis not present

## 2017-03-08 ENCOUNTER — Telehealth: Payer: Self-pay | Admitting: Unknown Physician Specialty

## 2017-03-08 ENCOUNTER — Encounter: Payer: Self-pay | Admitting: Unknown Physician Specialty

## 2017-03-08 ENCOUNTER — Ambulatory Visit (INDEPENDENT_AMBULATORY_CARE_PROVIDER_SITE_OTHER): Payer: Medicare Other | Admitting: Unknown Physician Specialty

## 2017-03-08 DIAGNOSIS — N183 Chronic kidney disease, stage 3 unspecified: Secondary | ICD-10-CM

## 2017-03-08 DIAGNOSIS — I129 Hypertensive chronic kidney disease with stage 1 through stage 4 chronic kidney disease, or unspecified chronic kidney disease: Secondary | ICD-10-CM | POA: Diagnosis not present

## 2017-03-08 DIAGNOSIS — R600 Localized edema: Secondary | ICD-10-CM

## 2017-03-08 NOTE — Assessment & Plan Note (Addendum)
Improved control off of Amlodipine and on Aldactone.  Continue present and check K levels next visit

## 2017-03-08 NOTE — Progress Notes (Signed)
BP 137/76   Pulse 62   Temp 98.2 F (36.8 C)   Wt 211 lb (95.7 kg)   LMP  (LMP Unknown)   SpO2 98%   BMI 36.33 kg/m    Subjective:    Patient ID: Veronica Colon, female    DOB: June 26, 1932, 83 y.o.   MRN: [id number]  HPI: Veronica Colon is a 83 y.o. female  Chief Complaint  Patient presents with  . Hypertension    1 week f/up    Pt is here with her daughter.    Pt is here to f/u hypertension, ankle swelling and dizzyness.  Pt states she feels well.  Dizzyness still there but improved.  Ankle swelling improved.  BP is well controlled on current medication.  No SOB  Prednisone is down to 25 mg.    Relevant past medical, surgical, family and social history reviewed and updated as indicated. Interim medical history since our last visit reviewed. Allergies and medications reviewed and updated.  Review of Systems  Per HPI unless specifically indicated above     Objective:    BP 137/76   Pulse 62   Temp 98.2 F (36.8 C)   Wt 211 lb (95.7 kg)   LMP  (LMP Unknown)   SpO2 98%   BMI 36.33 kg/m   Wt Readings from Last 3 Encounters:  03/08/17 211 lb (95.7 kg)  03/01/17 214 lb 4.8 oz (97.2 kg)  10/20/16 199 lb 3.2 oz (90.4 kg)    Physical Exam  Constitutional: She is oriented to person, place, and time. She appears well-developed and well-nourished. No distress.  HENT:  Head: Normocephalic and atraumatic.  Eyes: Conjunctivae and lids are normal. Right eye exhibits no discharge. Left eye exhibits no discharge. No scleral icterus.  Neck: Normal range of motion. Neck supple. No JVD present. Carotid bruit is not present.  Cardiovascular: Normal rate, regular rhythm and normal heart sounds.   Pulmonary/Chest: Effort normal and breath sounds normal.  Abdominal: Normal appearance. There is no splenomegaly or hepatomegaly.  Musculoskeletal: Normal range of motion.       Right foot: There is swelling.       Left foot: There is swelling.  Swelling in feet but not ankles    Neurological: She is alert and oriented to person, place, and time.  Skin: Skin is warm, dry and intact. No rash noted. No pallor.  Psychiatric: She has a normal mood and affect. Her behavior is normal. Judgment and thought content normal.    Results for orders placed or performed in visit on 60/63/01  Basic Metabolic Panel (BMET)  Result Value Ref Range   Glucose 128 (H) 65 - 99 mg/dL   BUN 17 8 - 27 mg/dL   Creatinine, Ser 0.96 0.57 - 1.00 mg/dL   GFR calc non Af Amer 55 (L) >59 mL/min/1.73   GFR calc Af Amer 63 >59 mL/min/1.73   BUN/Creatinine Ratio 18 12 - 28   Sodium 142 134 - 144 mmol/L   Potassium 4.0 3.5 - 5.2 mmol/L   Chloride 100 96 - 106 mmol/L   CO2 22 20 - 29 mmol/L   Calcium 9.3 8.7 - 10.3 mg/dL      Assessment & Plan:   Problem List Items Addressed This Visit      Unprioritized   Hypertensive CKD (chronic kidney disease)    Improved control off of Amlodipine and on Aldactone.  Continue present and check K levels next visit  Pedal edema    Improved.  Still there.  Discussed wearing support hose          Follow up plan: Return in about 4 weeks (around 04/05/2017) for potassium.

## 2017-03-08 NOTE — Telephone Encounter (Signed)
Called pt to schedule for Annual Wellness Visit with Nurse Health Advisor, Tiffany Hill, my c/b # is [phone]  Kathryn Brown ° °

## 2017-03-08 NOTE — Assessment & Plan Note (Signed)
Improved.  Still there.  Discussed wearing support hose

## 2017-03-10 ENCOUNTER — Other Ambulatory Visit: Payer: Self-pay | Admitting: Unknown Physician Specialty

## 2017-03-10 DIAGNOSIS — Z1231 Encounter for screening mammogram for malignant neoplasm of breast: Secondary | ICD-10-CM

## 2017-03-23 DIAGNOSIS — H903 Sensorineural hearing loss, bilateral: Secondary | ICD-10-CM | POA: Diagnosis not present

## 2017-03-23 DIAGNOSIS — R42 Dizziness and giddiness: Secondary | ICD-10-CM | POA: Diagnosis not present

## 2017-03-29 ENCOUNTER — Encounter: Payer: Self-pay | Admitting: Unknown Physician Specialty

## 2017-03-29 ENCOUNTER — Ambulatory Visit (INDEPENDENT_AMBULATORY_CARE_PROVIDER_SITE_OTHER): Payer: Medicare Other | Admitting: Unknown Physician Specialty

## 2017-03-29 VITALS — BP 133/79 | HR 66 | Temp 98.6°F | Wt 212.0 lb

## 2017-03-29 DIAGNOSIS — I129 Hypertensive chronic kidney disease with stage 1 through stage 4 chronic kidney disease, or unspecified chronic kidney disease: Secondary | ICD-10-CM | POA: Diagnosis not present

## 2017-03-29 DIAGNOSIS — N183 Chronic kidney disease, stage 3 unspecified: Secondary | ICD-10-CM

## 2017-03-29 DIAGNOSIS — Z23 Encounter for immunization: Secondary | ICD-10-CM | POA: Diagnosis not present

## 2017-03-29 NOTE — Assessment & Plan Note (Addendum)
Stable, continue present medications. Check Potassium due to CKD and being on Aldactone

## 2017-03-29 NOTE — Progress Notes (Signed)
BP 133/79 (BP Location: Right Arm, Cuff Size: Large)   Pulse 66   Temp 98.6 F (37 C)   Wt 212 lb (96.2 kg)   LMP  (LMP Unknown)   SpO2 95%   BMI 36.50 kg/m    Subjective:    Patient ID: Veronica Colon, female    DOB: 10-28-1931, 83 y.o.   MRN: [id number]  HPI: Veronica Colon is a 83 y.o. female  Chief Complaint  Patient presents with  . Hypertension  . Labs Only    4 week f/up to check potassium    Hypertension Using medications without difficulty Average home BPs Not checking   No problems or lightheadedness No chest pain with exertion or shortness of breath No Edema   Relevant past medical, surgical, family and social history reviewed and updated as indicated. Interim medical history since our last visit reviewed. Allergies and medications reviewed and updated.  Review of Systems  Per HPI unless specifically indicated above     Objective:    BP 133/79 (BP Location: Right Arm, Cuff Size: Large)   Pulse 66   Temp 98.6 F (37 C)   Wt 212 lb (96.2 kg)   LMP  (LMP Unknown)   SpO2 95%   BMI 36.50 kg/m   Wt Readings from Last 3 Encounters:  03/29/17 212 lb (96.2 kg)  03/08/17 211 lb (95.7 kg)  03/01/17 214 lb 4.8 oz (97.2 kg)    Physical Exam  Constitutional: She is oriented to person, place, and time. She appears well-developed and well-nourished. No distress.  HENT:  Head: Normocephalic and atraumatic.  Eyes: Conjunctivae and lids are normal. Right eye exhibits no discharge. Left eye exhibits no discharge. No scleral icterus.  Neck: Normal range of motion. Neck supple. No JVD present. Carotid bruit is not present.  Cardiovascular: Normal rate, regular rhythm and normal heart sounds.   Pulmonary/Chest: Effort normal and breath sounds normal.  Abdominal: Normal appearance. There is no splenomegaly or hepatomegaly.  Musculoskeletal: Normal range of motion.  Neurological: She is alert and oriented to person, place, and time.  Skin: Skin is warm, dry and  intact. No rash noted. No pallor.  Psychiatric: She has a normal mood and affect. Her behavior is normal. Judgment and thought content normal.    Results for orders placed or performed in visit on 76/73/41  Basic Metabolic Panel (BMET)  Result Value Ref Range   Glucose 128 (H) 65 - 99 mg/dL   BUN 17 8 - 27 mg/dL   Creatinine, Ser 0.96 0.57 - 1.00 mg/dL   GFR calc non Af Amer 55 (L) >59 mL/min/1.73   GFR calc Af Amer 63 >59 mL/min/1.73   BUN/Creatinine Ratio 18 12 - 28   Sodium 142 134 - 144 mmol/L   Potassium 4.0 3.5 - 5.2 mmol/L   Chloride 100 96 - 106 mmol/L   CO2 22 20 - 29 mmol/L   Calcium 9.3 8.7 - 10.3 mg/dL      Assessment & Plan:   Problem List Items Addressed This Visit      Unprioritized   Hypertensive CKD (chronic kidney disease)    Stable, continue present medications. Check Potassium due to CKD and being on Aldactone       Relevant Orders   Comprehensive metabolic panel    Other Visit Diagnoses    Need for influenza vaccination    -  Primary   Relevant Orders   Flu vaccine HIGH DOSE PF (Completed)  Follow up plan: Return in about 3 months (around 06/28/2017) for wellness and physical.

## 2017-03-29 NOTE — Patient Instructions (Signed)

## 2017-03-30 ENCOUNTER — Encounter: Payer: Self-pay | Admitting: Unknown Physician Specialty

## 2017-03-30 DIAGNOSIS — R42 Dizziness and giddiness: Secondary | ICD-10-CM | POA: Diagnosis not present

## 2017-03-30 LAB — COMPREHENSIVE METABOLIC PANEL
ALT: 19 IU/L (ref 0–32)
AST: 26 IU/L (ref 0–40)
Albumin/Globulin Ratio: 2 (ref 1.2–2.2)
Albumin: 3.8 g/dL (ref 3.5–4.7)
Alkaline Phosphatase: 46 IU/L (ref 39–117)
BUN/Creatinine Ratio: 19 (ref 12–28)
BUN: 20 mg/dL (ref 8–27)
Bilirubin Total: 0.4 mg/dL (ref 0.0–1.2)
CO2: 21 mmol/L (ref 20–29)
Calcium: 9.6 mg/dL (ref 8.7–10.3)
Chloride: 103 mmol/L (ref 96–106)
Creatinine, Ser: 1.08 mg/dL — ABNORMAL HIGH (ref 0.57–1.00)
GFR calc Af Amer: 55 mL/min/{1.73_m2} — ABNORMAL LOW (ref >59)
GFR calc non Af Amer: 48 mL/min/{1.73_m2} — ABNORMAL LOW (ref >59)
Globulin, Total: 1.9 g/dL (ref 1.5–4.5)
Glucose: 118 mg/dL — ABNORMAL HIGH (ref 65–99)
Potassium: 4.6 mmol/L (ref 3.5–5.2)
Sodium: 144 mmol/L (ref 134–144)
Total Protein: 5.7 g/dL — ABNORMAL LOW (ref 6.0–8.5)

## 2017-04-06 ENCOUNTER — Ambulatory Visit
Admission: RE | Admit: 2017-04-06 | Discharge: 2017-04-06 | Disposition: A | Discharge status: Home / Self Care | Payer: Medicare Other | Source: Ambulatory Visit | Attending: Unknown Physician Specialty | Admitting: Unknown Physician Specialty

## 2017-04-06 DIAGNOSIS — Z1231 Encounter for screening mammogram for malignant neoplasm of breast: Secondary | ICD-10-CM | POA: Diagnosis not present

## 2017-04-06 HISTORY — DX: Personal history of antineoplastic chemotherapy: Z92.21

## 2017-04-25 ENCOUNTER — Other Ambulatory Visit: Payer: Self-pay | Admitting: Unknown Physician Specialty

## 2017-04-25 DIAGNOSIS — M316 Other giant cell arteritis: Secondary | ICD-10-CM | POA: Diagnosis not present

## 2017-04-25 DIAGNOSIS — Z79899 Other long term (current) drug therapy: Secondary | ICD-10-CM | POA: Diagnosis not present

## 2017-04-27 ENCOUNTER — Other Ambulatory Visit: Payer: Self-pay | Admitting: Unknown Physician Specialty

## 2017-05-21 ENCOUNTER — Other Ambulatory Visit: Payer: Self-pay | Admitting: Unknown Physician Specialty

## 2017-05-23 NOTE — Telephone Encounter (Signed)
Request for refill of Norvasc; Looks like med was d/c'd on 02/26/17 by Kathrine Haddock

## 2017-05-23 NOTE — Telephone Encounter (Signed)
Request for Norvasc Refill; Med d/c'd on 02/26/17 by Kathrine Haddock

## 2017-05-23 NOTE — Telephone Encounter (Signed)
This is not a t [facility] pt

## 2017-05-25 ENCOUNTER — Other Ambulatory Visit: Payer: Self-pay | Admitting: Unknown Physician Specialty

## 2017-06-06 DIAGNOSIS — M316 Other giant cell arteritis: Secondary | ICD-10-CM | POA: Diagnosis not present

## 2017-06-06 DIAGNOSIS — Z79899 Other long term (current) drug therapy: Secondary | ICD-10-CM | POA: Diagnosis not present

## 2017-06-10 ENCOUNTER — Other Ambulatory Visit: Payer: Self-pay | Admitting: Unknown Physician Specialty

## 2017-06-21 ENCOUNTER — Telehealth: Payer: Self-pay | Admitting: Unknown Physician Specialty

## 2017-06-21 NOTE — Telephone Encounter (Signed)
Received request for refill on patients spironolactone 50mg   #[facility] Fax # [phone]

## 2017-06-22 ENCOUNTER — Other Ambulatory Visit: Payer: Self-pay | Admitting: Family Medicine

## 2017-06-22 MED ORDER — SPIRONOLACTONE 50 MG PO TABS: 50.0000 mg | ORAL_TABLET | Freq: Every day | ORAL | 0 refills | Status: DC

## 2017-06-22 NOTE — Telephone Encounter (Signed)
Refills sent

## 2017-06-29 ENCOUNTER — Ambulatory Visit: Payer: Medicare Other

## 2017-06-29 ENCOUNTER — Ambulatory Visit: Payer: Medicare Other | Admitting: Unknown Physician Specialty

## 2017-07-01 ENCOUNTER — Ambulatory Visit (INDEPENDENT_AMBULATORY_CARE_PROVIDER_SITE_OTHER): Payer: Medicare Other

## 2017-07-01 ENCOUNTER — Ambulatory Visit: Payer: Medicare Other | Admitting: Unknown Physician Specialty

## 2017-07-01 VITALS — BP 118/72 | HR 60 | Temp 98.1°F | Resp 17 | Ht 64.0 in | Wt 215.1 lb

## 2017-07-01 DIAGNOSIS — Z0000 Encounter for general adult medical examination without abnormal findings: Secondary | ICD-10-CM

## 2017-07-01 NOTE — Patient Instructions (Signed)
Veronica Colon , Thank you for taking time to come for your Medicare Wellness Visit. I appreciate your ongoing commitment to your health goals. Please review the following plan we discussed and let me know if I can assist you in the future.   Screening recommendations/referrals: Colonoscopy: completed 06/04/2015 Mammogram: completed 04/06/2017 Bone Density: completed 02/26/2015 Recommended yearly ophthalmology/optometry visit for glaucoma screening and checkup Recommended yearly dental visit for hygiene and checkup  Vaccinations: Influenza vaccine: up to date Pneumococcal vaccine: up to date Tdap vaccine: up to date Shingles vaccine: up to date  Advanced directives: Advance directive discussed with you today. I have provided a copy for you to complete at home and have notarized. Once this is complete please bring a copy in to our office so we can scan it into your chart.  Conditions/risks identified: recommend drinking at least 6-8 glasses of water a day   Next appointment: Follow up on 07/15/2017 at 10:00am wit Regino Schultze. Follow up in one year for your annual wellness exam.    Preventive Care 81 Years and Older, Female Preventive care refers to lifestyle choices and visits with your health care provider that can promote health and wellness./ What does preventive care include?  A yearly physical exam. This is also called an annual well check.  Dental exams once or twice a year.  Routine eye exams. Ask your health care provider how often you should have your eyes checked.  Personal lifestyle choices, including:  Daily care of your teeth and gums.  Regular physical activity.  Eating a healthy diet.  Avoiding tobacco and drug use.  Limiting alcohol use.  Practicing safe sex.  Taking low-dose aspirin every day.  Taking vitamin and mineral supplements as recommended by your health care provider. What happens during an annual well check? The services and screenings done by  your health care provider during your annual well check will depend on your age, overall health, lifestyle risk factors, and family history of disease. Counseling  Your health care provider may ask you questions about your:  Alcohol use.  Tobacco use.  Drug use.  Emotional well-being.  Home and relationship well-being.  Sexual activity.  Eating habits.  History of falls.  Memory and ability to understand (cognition).  Work and work Statistician.  Reproductive health. Screening  You may have the following tests or measurements:  Height, weight, and BMI.  Blood pressure.  Lipid and cholesterol levels. These may be checked every 5 years, or more frequently if you are over 62 years old.  Skin check.  Lung cancer screening. You may have this screening every year starting at age 40 if you have a 30-pack-year history of smoking and currently smoke or have quit within the past 15 years.  Fecal occult blood test (FOBT) of the stool. You may have this test every year starting at age 18.  Flexible sigmoidoscopy or colonoscopy. You may have a sigmoidoscopy every 5 years or a colonoscopy every 10 years starting at age 69.  Hepatitis C blood test.  Hepatitis B blood test.  Sexually transmitted disease (STD) testing.  Diabetes screening. This is done by checking your blood sugar (glucose) after you have not eaten for a while (fasting). You may have this done every 1-3 years.  Bone density scan. This is done to screen for osteoporosis. You may have this done starting at age 37.  Mammogram. This may be done every 1-2 years. Talk to your health care provider about how often you should have  regular mammograms. Talk with your health care provider about your test results, treatment options, and if necessary, the need for more tests. Vaccines  Your health care provider may recommend certain vaccines, such as:  Influenza vaccine. This is recommended every year.  Tetanus,  diphtheria, and acellular pertussis (Tdap, Td) vaccine. You may need a Td booster every 10 years.  Zoster vaccine. You may need this after age 85.  Pneumococcal 13-valent conjugate (PCV13) vaccine. One dose is recommended after age 39.  Pneumococcal polysaccharide (PPSV23) vaccine. One dose is recommended after age 59. Talk to your health care provider about which screenings and vaccines you need and how often you need them. This information is not intended to replace advice given to you by your health care provider. Make sure you discuss any questions you have with your health care provider. Document Released: 07/25/2015 Document Revised: 03/17/2016 Document Reviewed: 04/29/2015 Elsevier Interactive Patient Education  2017 [place] Prevention in the Home Falls can cause injuries. They can happen to people of all ages. There are many things you can do to make your home safe and to help prevent falls. What can I do on the outside of my home?  Regularly fix the edges of walkways and driveways and fix any cracks.  Remove anything that might make you trip as you walk through a door, such as a raised step or threshold.  Trim any bushes or trees on the path to your home.  Use bright outdoor lighting.  Clear any walking paths of anything that might make someone trip, such as rocks or tools.  Regularly check to see if handrails are loose or broken. Make sure that both sides of any steps have handrails.  Any raised decks and porches should have guardrails on the edges.  Have any leaves, snow, or ice cleared regularly.  Use sand or salt on walking paths during winter.  Clean up any spills in your garage right away. This includes oil or grease spills. What can I do in the bathroom?  Use night lights.  Install grab bars by the toilet and in the tub and shower. Do not use towel bars as grab bars.  Use non-skid mats or decals in the tub or shower.  If you need to sit down in  the shower, use a plastic, non-slip stool.  Keep the floor dry. Clean up any water that spills on the floor as soon as it happens.  Remove soap buildup in the tub or shower regularly.  Attach bath mats securely with double-sided non-slip rug tape.  Do not have throw rugs and other things on the floor that can make you trip. What can I do in the bedroom?  Use night lights.  Make sure that you have a light by your bed that is easy to reach.  Do not use any sheets or blankets that are too big for your bed. They should not hang down onto the floor.  Have a firm chair that has side arms. You can use this for support while you get dressed.  Do not have throw rugs and other things on the floor that can make you trip. What can I do in the kitchen?  Clean up any spills right away.  Avoid walking on wet floors.  Keep items that you use a lot in easy-to-reach places.  If you need to reach something above you, use a strong step stool that has a grab bar.  Keep electrical cords out of the  way.  Do not use floor polish or wax that makes floors slippery. If you must use wax, use non-skid floor wax.  Do not have throw rugs and other things on the floor that can make you trip. What can I do with my stairs?  Do not leave any items on the stairs.  Make sure that there are handrails on both sides of the stairs and use them. Fix handrails that are broken or loose. Make sure that handrails are as long as the stairways.  Check any carpeting to make sure that it is firmly attached to the stairs. Fix any carpet that is loose or worn.  Avoid having throw rugs at the top or bottom of the stairs. If you do have throw rugs, attach them to the floor with carpet tape.  Make sure that you have a light switch at the top of the stairs and the bottom of the stairs. If you do not have them, ask someone to add them for you. What else can I do to help prevent falls?  Wear shoes that:  Do not have high  heels.  Have rubber bottoms.  Are comfortable and fit you well.  Are closed at the toe. Do not wear sandals.  If you use a stepladder:  Make sure that it is fully opened. Do not climb a closed stepladder.  Make sure that both sides of the stepladder are locked into place.  Ask someone to hold it for you, if possible.  Clearly mark and make sure that you can see:  Any grab bars or handrails.  First and last steps.  Where the edge of each step is.  Use tools that help you move around (mobility aids) if they are needed. These include:  Canes.  Walkers.  Scooters.  Crutches.  Turn on the lights when you go into a dark area. Replace any light bulbs as soon as they burn out.  Set up your furniture so you have a clear path. Avoid moving your furniture around.  If any of your floors are uneven, fix them.  If there are any pets around you, be aware of where they are.  Review your medicines with your doctor. Some medicines can make you feel dizzy. This can increase your chance of falling. Ask your doctor what other things that you can do to help prevent falls. This information is not intended to replace advice given to you by your health care provider. Make sure you discuss any questions you have with your health care provider. Document Released: 04/24/2009 Document Revised: 12/04/2015 Document Reviewed: 08/02/2014 Elsevier Interactive Patient Education  2017 [employer].

## 2017-07-01 NOTE — Progress Notes (Signed)
Subjective:   Veronica Colon is a 83 y.o. female who presents for Medicare Annual (Subsequent) preventive examination.  Review of Systems:   Cardiac Risk Factors include: hypertension;advanced age (>41men, >46 women);obesity (BMI >30kg/m2);dyslipidemia     Objective:     Vitals: BP 118/72 (BP Location: Left Arm, Patient Position: Sitting)   Pulse 60   Temp 98.1 F (36.7 C) (Temporal)   Resp 17   Ht 5\' 4"  (1.626 m)   Wt 215 lb 1.6 oz (97.6 kg)   LMP  (LMP Unknown)   BMI 36.92 kg/m   Body mass index is 36.92 kg/m.  Advanced Directives 07/01/2017 10/16/2016 04/20/2016 08/13/2015  Does Patient Have a Medical Advance Directive? No No Yes Yes  Type of Advance Directive - Public librarian;Living will [place]  Would patient like information on creating a medical advance directive? Yes (MAU/Ambulatory/Procedural Areas - Information given) No - Patient declined - -    Tobacco Social History   Tobacco Use  Smoking Status Never Smoker  Smokeless Tobacco Never Used     Counseling given: Not Answered   Clinical Intake:  Pre-visit preparation completed: Yes  Pain : 0-10 Pain Score: 5  Pain Type: Acute pain Pain Location: Back Pain Orientation: Lower Pain Descriptors / Indicators: Aching Pain Onset: In the past 7 days Pain Frequency: Intermittent     Nutritional Status: BMI > 30  Obese Nutritional Risks: None Diabetes: No  How often do you need to have someone help you when you read instructions, pamphlets, or other written materials from your doctor or pharmacy?: 1 - Never What is the last grade level you completed in school?: 12th grade  Interpreter Needed?: No  Information entered by :: Tiffany Hill,LPN   Past Medical History:  Diagnosis Date  . Allergy   . Breast cancer ([place]) 1990   LT MASTECTOMY  . Cancer ([place]) 1990   LT BREAST MASTECTOMY  . GERD (gastroesophageal reflux disease)   . Hypertension   . Personal history of  chemotherapy   . Status post chemotherapy    breast cancer   Past Surgical History:  Procedure Laterality Date  . ABDOMINAL HYSTERECTOMY    . BREAST SURGERY    . COLONOSCOPY WITH PROPOFOL N/A 06/04/2015   Procedure: COLONOSCOPY WITH PROPOFOL;  Surgeon: Manya Silvas, MD;  Location: [facility] ENDOSCOPY;  Service: Endoscopy;  Laterality: N/A;  . MASTECTOMY Left 1990   BREAST CA  . TEMPORAL ARTERY BIOPSY / LIGATION  11/2016   Family History  Problem Relation Age of Onset  . Breast cancer Mother 50  . Cancer Mother        breast  . Cancer Father        lung   Social History   Socioeconomic History  . Marital status: Divorced    Spouse name: None  . Number of children: None  . Years of education: None  . Highest education level: None  Social Needs  . Financial resource strain: Not hard at all  . Food insecurity - worry: Never true  . Food insecurity - inability: Never true  . Transportation needs - medical: No  . Transportation needs - non-medical: No  Occupational History  . None  Tobacco Use  . Smoking status: Never Smoker  . Smokeless tobacco: Never Used  Substance and Sexual Activity  . Alcohol use: No    Alcohol/week: 0.0 oz  . Drug use: No  . Sexual activity: Yes  Other Topics  Concern  . None  Social History Narrative  . None    Outpatient Encounter Medications as of 07/01/2017  Medication Sig  . alendronate (FOSAMAX) 70 MG tablet Take 70 mg by mouth once a week.   Marland Kitchen aspirin 81 MG tablet Take 81 mg by mouth daily.  . fluticasone (FLONASE) 50 MCG/ACT nasal spray PLACE 2 SPRAYS INTO BOTH NOSTRILS DAILY.  . Multiple Vitamin (MULTIVITAMIN) tablet Take 1 tablet by mouth daily.  . predniSONE (DELTASONE) 2.5 MG tablet Take by mouth.  . spironolactone (ALDACTONE) 50 MG tablet Take 1 tablet (50 mg total) by mouth daily.  . Vitamin D, Ergocalciferol, (DRISDOL) 50000 units CAPS capsule TAKE ONE CAPSULE BY MOUTH ONE TIME PER WEEK  . [DISCONTINUED] predniSONE  (DELTASONE) 10 MG tablet Take by mouth. Taking 12.5 mg currently  . baclofen (LIORESAL) 20 MG tablet TAKE 1 TABLET (20 MG TOTAL) BY MOUTH AT BEDTIME AS NEEDED FOR MUSCLE SPASMS. (Patient not taking: Reported on 03/01/2017)   No facility-administered encounter medications on file as of 07/01/2017.     Activities of Daily Living In your present state of health, do you have any difficulty performing the following activities: 07/01/2017  Hearing? Y  Comment goes to hearing specialist  Vision? N  Difficulty concentrating or making decisions? N  Walking or climbing stairs? N  Dressing or bathing? N  Doing errands, shopping? N  Preparing Food and eating ? N  Using the Toilet? N  In the past six months, have you accidently leaked urine? N  Do you have problems with loss of bowel control? N  Managing your Medications? N  Managing your Finances? N  Housekeeping or managing your Housekeeping? N  Some recent data might be hidden    Patient Care Team: Kathrine Haddock, NP as PCP - General (Nurse Practitioner) Marlowe Sax, MD as Referring Physician (Internal Medicine)    Assessment:   This is a routine wellness examination for Veronica Colon.  Exercise Activities and Dietary recommendations Current Exercise Habits: Home exercise routine, Time (Minutes): 60, Frequency (Times/Week): 7, Weekly Exercise (Minutes/Week): 420, Intensity: Mild, Exercise limited by: None identified  Goals    . DIET - INCREASE WATER INTAKE     recommend drinking at least 6-8 glasses of water a day        Fall Risk Fall Risk  07/01/2017 05/18/2016 04/20/2016 04/14/2015  Falls in the past year? No No No No   Is the patient's home free of loose throw rugs in walkways, pet beds, electrical cords, etc?   yes      Grab bars in the bathroom? no      Handrails on the stairs?   No stairs in home      Adequate lighting?   yes  Timed Get Up and Go performed: completed in 8 seconds with no use of assistive devices,  steady gait. No intervention at this time.   Depression Screen PHQ 2/9 Scores 07/01/2017 03/01/2017 06/18/2016 05/18/2016  PHQ - 2 Score 0 0 0 3  PHQ- 9 Score - 1 - 4     Cognitive Function     6CIT Screen 07/01/2017  What Year? 0 points  What month? 0 points  What time? 0 points  Count back from 20 0 points  Months in reverse 0 points  Repeat phrase 0 points  Total Score 0    Immunization History  Administered Date(s) Administered  . Influenza, High Dose Seasonal PF 04/20/2016, 03/29/2017  . Influenza,inj,Quad PF,6+ Mos 04/14/2015  . Influenza-Unspecified  03/29/2012, 04/04/2013, 04/10/2014, 04/14/2015, 04/20/2016  . Pneumococcal Conjugate-13 04/10/2014  . Pneumococcal Polysaccharide-23 03/03/2003  . Td 01/30/2002  . Tdap 04/14/2015  . Zoster 04/10/2014    Qualifies for Shingles Vaccine? Up to date  Screening Tests Health Maintenance  Topic Date Due  . TETANUS/TDAP  04/13/2025  . INFLUENZA VACCINE  Completed  . DEXA SCAN  Completed  . PNA vac Low Risk Adult  Completed    Cancer Screenings: Lung: Low Dose CT Chest recommended if Age 71-80 years, 30 pack-year currently smoking OR have quit w/in 15years. Patient does not qualify. Breast:  Up to date on Mammogram? Yes   Up to date of Bone Density/Dexa? Yes Colorectal: completed 06/04/2015  Additional Screenings:  Hepatitis B/HIV/Syphillis: not indicated  Hepatitis C Screening: not indicated     Plan:    I have personally reviewed and addressed the Medicare Annual Wellness questionnaire and have noted the following in the patient's chart:  A. Medical and social history B. Use of alcohol, tobacco or illicit drugs  C. Current medications and supplements D. Functional ability and status E.  Nutritional status F.  Physical activity G. Advance directives H. List of other physicians I.  Hospitalizations, surgeries, and ER visits in previous 12 months J.  [place] such as hearing and vision if needed,  cognitive and depression L. Referrals and appointments   In addition, I have reviewed and discussed with patient certain preventive protocols, quality metrics, and best practice recommendations. A written personalized care plan for preventive services as well as general preventive health recommendations were provided to patient.   Signed,  Tyler Aas, LPN Nurse Health Advisor   Nurse Notes: patient requested at home BP cuff. RX written by Regino Schultze.

## 2017-07-15 ENCOUNTER — Ambulatory Visit (INDEPENDENT_AMBULATORY_CARE_PROVIDER_SITE_OTHER): Payer: Medicare Other | Admitting: Unknown Physician Specialty

## 2017-07-15 ENCOUNTER — Encounter: Payer: Self-pay | Admitting: Unknown Physician Specialty

## 2017-07-15 VITALS — BP 142/84 | HR 72 | Temp 98.3°F | Ht 64.0 in | Wt 213.6 lb

## 2017-07-15 DIAGNOSIS — G8929 Other chronic pain: Secondary | ICD-10-CM

## 2017-07-15 DIAGNOSIS — N183 Chronic kidney disease, stage 3 unspecified: Secondary | ICD-10-CM

## 2017-07-15 DIAGNOSIS — I129 Hypertensive chronic kidney disease with stage 1 through stage 4 chronic kidney disease, or unspecified chronic kidney disease: Secondary | ICD-10-CM

## 2017-07-15 DIAGNOSIS — E559 Vitamin D deficiency, unspecified: Secondary | ICD-10-CM

## 2017-07-15 DIAGNOSIS — M545 Low back pain, unspecified: Secondary | ICD-10-CM

## 2017-07-15 DIAGNOSIS — Z7189 Other specified counseling: Secondary | ICD-10-CM | POA: Diagnosis not present

## 2017-07-15 DIAGNOSIS — Z5181 Encounter for therapeutic drug level monitoring: Secondary | ICD-10-CM

## 2017-07-15 DIAGNOSIS — M549 Dorsalgia, unspecified: Secondary | ICD-10-CM

## 2017-07-15 NOTE — Assessment & Plan Note (Signed)
Check GFR today

## 2017-07-15 NOTE — Progress Notes (Signed)
BP (!) 142/84 (BP Location: Right Arm, Cuff Size: Large)   Pulse 72   Temp 98.3 F (36.8 C) (Oral)   Ht 5\' 4"  (1.626 m)   Wt 213 lb 9.6 oz (96.9 kg)   LMP  (LMP Unknown)   SpO2 97%   BMI 36.66 kg/m    Subjective:    Patient ID: Veronica Colon, female    DOB: [birth year], 84 y.o.   MRN: [id number]  HPI: Veronica Colon is a 84 y.o. female  Chief Complaint  Patient presents with  . Annual Exam    pt had wellness exam with [facility] 07/01/2017   Hypertension Using medications without difficulty Average home BPs Not checking   No problems or lightheadedness No chest pain with exertion or shortness of breath No Edema  CKD Last GFR was 52.     Relevant past medical, surgical, family and social history reviewed and updated as indicated. Interim medical history since our last visit reviewed. Allergies and medications reviewed and updated.  Review of Systems  Constitutional: Negative.   HENT: Negative.        Right eye with clear drainage  Eyes: Negative.   Respiratory: Negative.   Cardiovascular: Negative.   Gastrointestinal: Negative.   Endocrine: Negative.   Genitourinary: Negative.   Musculoskeletal: Positive for back pain.  Skin: Negative.   Allergic/Immunologic: Negative.   Neurological: Negative.   Hematological: Negative.   Psychiatric/Behavioral: Negative.     Per HPI unless specifically indicated above     Objective:    BP (!) 142/84 (BP Location: Right Arm, Cuff Size: Large)   Pulse 72   Temp 98.3 F (36.8 C) (Oral)   Ht 5\' 4"  (1.626 m)   Wt 213 lb 9.6 oz (96.9 kg)   LMP  (LMP Unknown)   SpO2 97%   BMI 36.66 kg/m   Wt Readings from Last 3 Encounters:  07/15/17 213 lb 9.6 oz (96.9 kg)  07/01/17 215 lb 1.6 oz (97.6 kg)  03/29/17 212 lb (96.2 kg)    Physical Exam  Constitutional: She is oriented to person, place, and time. She appears well-developed and well-nourished. No distress.  HENT:  Head: Normocephalic and atraumatic.  Eyes: Conjunctivae and  lids are normal. Right eye exhibits no discharge. Left eye exhibits no discharge. No scleral icterus.  Neck: Normal range of motion. Neck supple. No JVD present. Carotid bruit is not present.  Cardiovascular: Normal rate, regular rhythm and normal heart sounds.  Pulmonary/Chest: Effort normal and breath sounds normal.  Abdominal: Normal appearance. There is no splenomegaly or hepatomegaly.  Musculoskeletal: Normal range of motion.  Neurological: She is alert and oriented to person, place, and time.  Skin: Skin is warm, dry and intact. No rash noted. No pallor.  Psychiatric: She has a normal mood and affect. Her behavior is normal. Judgment and thought content normal.    Results for orders placed or performed in visit on 03/29/17  Comprehensive metabolic panel  Result Value Ref Range   Glucose 118 (H) 65 - 99 mg/dL   BUN 20 8 - 27 mg/dL   Creatinine, Ser 1.08 (H) 0.57 - 1.00 mg/dL   GFR calc non Af Amer 48 (L) >59 mL/min/1.73   GFR calc Af Amer 55 (L) >59 mL/min/1.73   BUN/Creatinine Ratio 19 12 - 28   Sodium 144 134 - 144 mmol/L   Potassium 4.6 3.5 - 5.2 mmol/L   Chloride 103 96 - 106 mmol/L   CO2 21 20 - 29  mmol/L   Calcium 9.6 8.7 - 10.3 mg/dL   Total Protein 5.7 (L) 6.0 - 8.5 g/dL   Albumin 3.8 3.5 - 4.7 g/dL   Globulin, Total 1.9 1.5 - 4.5 g/dL   Albumin/Globulin Ratio 2.0 1.2 - 2.2   Bilirubin Total 0.4 0.0 - 1.2 mg/dL   Alkaline Phosphatase 46 39 - 117 IU/L   AST 26 0 - 40 IU/L   ALT 19 0 - 32 IU/L      Assessment & Plan:   Problem List Items Addressed This Visit      Unprioritized   Advanced care planning/counseling discussion    A voluntary discussion about advance care planning including the explanation and discussion of advance directives was extensively discussed  with the patient.  Explanation about the health care proxy and Living will was reviewed and packet with forms with explanation of how to fill them out was given.  During this discussion, the patient was  able to identify a health care proxy as her daughter Katharina Caper and plans to fill out the paperwork required.  Patient was offered a separate [place] visit for further assistance with forms.         Back pain    Chair yoga.  Encouraged activity.  Exercises given      Relevant Medications   predniSONE (DELTASONE) 10 MG tablet   CKD (chronic kidney disease), stage III (HCC)    Check GFR today      Relevant Orders   Comprehensive metabolic panel   CBC with Differential/Platelet   Hypertensive CKD (chronic kidney disease)    Stable, continue present medications.        Relevant Orders   Comprehensive metabolic panel   Lipid Panel w/o Chol/HDL Ratio    Other Visit Diagnoses    Medication monitoring encounter    -  Primary   Relevant Orders   Comprehensive metabolic panel   Vitamin D deficiency           Follow up plan: Return in about 6 months (around 01/12/2018).

## 2017-07-15 NOTE — Patient Instructions (Addendum)

## 2017-07-15 NOTE — Assessment & Plan Note (Signed)
A voluntary discussion about advance care planning including the explanation and discussion of advance directives was extensively discussed  with the patient.  Explanation about the health care proxy and Living will was reviewed and packet with forms with explanation of how to fill them out was given.  During this discussion, the patient was able to identify a health care proxy as her daughter Veronica Colon and plans to fill out the paperwork required.  Patient was offered a separate [place] visit for further assistance with forms.

## 2017-07-15 NOTE — Assessment & Plan Note (Signed)
Stable, continue present medications.   

## 2017-07-15 NOTE — Assessment & Plan Note (Addendum)
New problem to me. Encouraged activity.  Exercises given.

## 2017-07-16 LAB — CBC WITH DIFFERENTIAL/PLATELET
Basophils Absolute: 0 10*3/uL (ref 0.0–0.2)
Basos: 0 %
EOS (ABSOLUTE): 0 10*3/uL (ref 0.0–0.4)
Eos: 0 %
Hematocrit: 42.9 % (ref 34.0–46.6)
Hemoglobin: 13.6 g/dL (ref 11.1–15.9)
Immature Grans (Abs): 0.1 10*3/uL (ref 0.0–0.1)
Immature Granulocytes: 1 %
Lymphocytes Absolute: 0.6 10*3/uL — ABNORMAL LOW (ref 0.7–3.1)
Lymphs: 6 %
MCH: 27.3 pg (ref 26.6–33.0)
MCHC: 31.7 g/dL (ref 31.5–35.7)
MCV: 86 fL (ref 79–97)
Monocytes Absolute: 0.5 10*3/uL (ref 0.1–0.9)
Monocytes: 6 %
Neutrophils Absolute: 8.3 10*3/uL — ABNORMAL HIGH (ref 1.4–7.0)
Neutrophils: 87 %
Platelets: 226 10*3/uL (ref 150–379)
RBC: 4.98 x10E6/uL (ref 3.77–5.28)
RDW: 14.3 % (ref 12.3–15.4)
WBC: 9.4 10*3/uL (ref 3.4–10.8)

## 2017-07-16 LAB — COMPREHENSIVE METABOLIC PANEL
ALT: 17 IU/L (ref 0–32)
AST: 16 IU/L (ref 0–40)
Albumin/Globulin Ratio: 1.8 (ref 1.2–2.2)
Albumin: 4.2 g/dL (ref 3.5–4.7)
Alkaline Phosphatase: 51 IU/L (ref 39–117)
BUN/Creatinine Ratio: 17 (ref 12–28)
BUN: 19 mg/dL (ref 8–27)
Bilirubin Total: 0.5 mg/dL (ref 0.0–1.2)
CO2: 23 mmol/L (ref 20–29)
Calcium: 10.2 mg/dL (ref 8.7–10.3)
Chloride: 100 mmol/L (ref 96–106)
Creatinine, Ser: 1.1 mg/dL — ABNORMAL HIGH (ref 0.57–1.00)
GFR calc Af Amer: 54 mL/min/{1.73_m2} — ABNORMAL LOW (ref >59)
GFR calc non Af Amer: 47 mL/min/{1.73_m2} — ABNORMAL LOW (ref >59)
Globulin, Total: 2.3 g/dL (ref 1.5–4.5)
Glucose: 125 mg/dL — ABNORMAL HIGH (ref 65–99)
Potassium: 4.6 mmol/L (ref 3.5–5.2)
Sodium: 143 mmol/L (ref 134–144)
Total Protein: 6.5 g/dL (ref 6.0–8.5)

## 2017-07-16 LAB — LIPID PANEL W/O CHOL/HDL RATIO
Cholesterol, Total: 233 mg/dL — ABNORMAL HIGH (ref 100–199)
HDL: 111 mg/dL (ref >39)
LDL Calculated: 91 mg/dL (ref 0–99)
Triglycerides: 153 mg/dL — ABNORMAL HIGH (ref 0–149)
VLDL Cholesterol Cal: 31 mg/dL (ref 5–40)

## 2017-07-18 ENCOUNTER — Encounter: Payer: Self-pay | Admitting: Unknown Physician Specialty

## 2017-07-26 DIAGNOSIS — Z79899 Other long term (current) drug therapy: Secondary | ICD-10-CM | POA: Diagnosis not present

## 2017-07-26 DIAGNOSIS — M316 Other giant cell arteritis: Secondary | ICD-10-CM | POA: Diagnosis not present

## 2017-09-06 DIAGNOSIS — Z79899 Other long term (current) drug therapy: Secondary | ICD-10-CM | POA: Diagnosis not present

## 2017-09-06 DIAGNOSIS — M316 Other giant cell arteritis: Secondary | ICD-10-CM | POA: Diagnosis not present

## 2017-09-18 ENCOUNTER — Other Ambulatory Visit: Payer: Self-pay | Admitting: Family Medicine

## 2017-09-19 NOTE — Telephone Encounter (Signed)
Spironolone refill Last OV: 03/29/17 Last Refill:06/22/17 #90 tabs Pharmacy:[facility] PCP: Kathrine Haddock NP

## 2017-10-08 DIAGNOSIS — R509 Fever, unspecified: Secondary | ICD-10-CM | POA: Diagnosis not present

## 2017-10-08 DIAGNOSIS — J101 Influenza due to other identified influenza virus with other respiratory manifestations: Secondary | ICD-10-CM | POA: Diagnosis not present

## 2017-10-08 DIAGNOSIS — J029 Acute pharyngitis, unspecified: Secondary | ICD-10-CM | POA: Diagnosis not present

## 2017-10-24 DIAGNOSIS — E559 Vitamin D deficiency, unspecified: Secondary | ICD-10-CM | POA: Diagnosis not present

## 2017-10-24 DIAGNOSIS — Z79899 Other long term (current) drug therapy: Secondary | ICD-10-CM | POA: Diagnosis not present

## 2017-10-24 DIAGNOSIS — M316 Other giant cell arteritis: Secondary | ICD-10-CM | POA: Diagnosis not present

## 2017-12-07 DIAGNOSIS — M316 Other giant cell arteritis: Secondary | ICD-10-CM | POA: Diagnosis not present

## 2017-12-07 DIAGNOSIS — Z79899 Other long term (current) drug therapy: Secondary | ICD-10-CM | POA: Diagnosis not present

## 2018-01-16 ENCOUNTER — Encounter: Payer: Self-pay | Admitting: Unknown Physician Specialty

## 2018-01-16 ENCOUNTER — Ambulatory Visit (INDEPENDENT_AMBULATORY_CARE_PROVIDER_SITE_OTHER): Payer: Medicare Other | Admitting: Unknown Physician Specialty

## 2018-01-16 DIAGNOSIS — R42 Dizziness and giddiness: Secondary | ICD-10-CM

## 2018-01-16 DIAGNOSIS — I129 Hypertensive chronic kidney disease with stage 1 through stage 4 chronic kidney disease, or unspecified chronic kidney disease: Secondary | ICD-10-CM

## 2018-01-16 DIAGNOSIS — N183 Chronic kidney disease, stage 3 unspecified: Secondary | ICD-10-CM

## 2018-01-16 DIAGNOSIS — M316 Other giant cell arteritis: Secondary | ICD-10-CM | POA: Diagnosis not present

## 2018-01-16 NOTE — Assessment & Plan Note (Addendum)
Discussed using Flonase more consistently.  Symptoms consistent with BPV.  Also discuss with rheumatology

## 2018-01-16 NOTE — Assessment & Plan Note (Signed)
Stable, continue present medications.   

## 2018-01-16 NOTE — Progress Notes (Signed)
BP 133/79   Pulse 65   Temp 98.2 F (36.8 C) (Oral)   Ht 5\' 4"  (1.626 m)   Wt 199 lb 12.8 oz (90.6 kg)   LMP  (LMP Unknown)   SpO2 99%   BMI 34.30 kg/m    Subjective:    Patient ID: Veronica Colon, female    DOB: May 18, 1932, 84 y.o.   MRN: [id number]  HPI: Veronica Colon is a 84 y.o. female  Chief Complaint  Patient presents with  . Depression  . Hypertension   Hypertension Using medications without difficulty Average home BPs not checking  No problems or lightheadedness No chest pain with exertion or shortness of breath No Edema  Vertigo Pt states when she gets up or lays down she feels dizzy.  States her ear feels numb.  States she has taken nose spray for the last 2 days.    Depression screen [facility] 2/9 01/16/2018 07/01/2017 03/01/2017 06/18/2016 05/18/2016  Decreased Interest 1 0 0 0 1  Down, Depressed, Hopeless 0 0 0 0 2  PHQ - 2 Score 1 0 0 0 3  Altered sleeping 0 - 0 - 0  Tired, decreased energy 1 - 1 - 0  Change in appetite 0 - 0 - 0  Feeling bad or failure about yourself  0 - 0 - 1  Trouble concentrating 0 - 0 - 0  Moving slowly or fidgety/restless 0 - 0 - 0  Suicidal thoughts 0 - 0 - 0  PHQ-9 Score 2 - 1 - 4     Relevant past medical, surgical, family and social history reviewed and updated as indicated. Interim medical history since our last visit reviewed. Allergies and medications reviewed and updated.  Review of Systems  Per HPI unless specifically indicated above     Objective:    BP 133/79   Pulse 65   Temp 98.2 F (36.8 C) (Oral)   Ht 5\' 4"  (1.626 m)   Wt 199 lb 12.8 oz (90.6 kg)   LMP  (LMP Unknown)   SpO2 99%   BMI 34.30 kg/m   Wt Readings from Last 3 Encounters:  01/16/18 199 lb 12.8 oz (90.6 kg)  07/15/17 213 lb 9.6 oz (96.9 kg)  07/01/17 215 lb 1.6 oz (97.6 kg)    Physical Exam  Constitutional: She is oriented to person, place, and time. She appears well-developed and well-nourished. No distress.  HENT:  Head: Normocephalic and  atraumatic.  Eyes: Conjunctivae and lids are normal. Right eye exhibits no discharge. Left eye exhibits no discharge. No scleral icterus.  Neck: Normal range of motion. Neck supple. No JVD present. Carotid bruit is not present.  Cardiovascular: Normal rate, regular rhythm and normal heart sounds.  Pulmonary/Chest: Effort normal and breath sounds normal.  Abdominal: Normal appearance. There is no splenomegaly or hepatomegaly.  Musculoskeletal: Normal range of motion.  Neurological: She is alert and oriented to person, place, and time.  Skin: Skin is warm, dry and intact. No rash noted. No pallor.  Psychiatric: She has a normal mood and affect. Her behavior is normal. Judgment and thought content normal.    Results for orders placed or performed in visit on 07/15/17  Comprehensive metabolic panel  Result Value Ref Range   Glucose 125 (H) 65 - 99 mg/dL   BUN 19 8 - 27 mg/dL   Creatinine, Ser 1.10 (H) 0.57 - 1.00 mg/dL   GFR calc non Af Amer 47 (L) >59 mL/min/1.73   GFR calc Af  Amer 54 (L) >59 mL/min/1.73   BUN/Creatinine Ratio 17 12 - 28   Sodium 143 134 - 144 mmol/L   Potassium 4.6 3.5 - 5.2 mmol/L   Chloride 100 96 - 106 mmol/L   CO2 23 20 - 29 mmol/L   Calcium 10.2 8.7 - 10.3 mg/dL   Total Protein 6.5 6.0 - 8.5 g/dL   Albumin 4.2 3.5 - 4.7 g/dL   Globulin, Total 2.3 1.5 - 4.5 g/dL   Albumin/Globulin Ratio 1.8 1.2 - 2.2   Bilirubin Total 0.5 0.0 - 1.2 mg/dL   Alkaline Phosphatase 51 39 - 117 IU/L   AST 16 0 - 40 IU/L   ALT 17 0 - 32 IU/L  CBC with Differential/Platelet  Result Value Ref Range   WBC 9.4 3.4 - 10.8 x10E3/uL   RBC 4.98 3.77 - 5.28 x10E6/uL   Hemoglobin 13.6 11.1 - 15.9 g/dL   Hematocrit 42.9 34.0 - 46.6 %   MCV 86 79 - 97 fL   MCH 27.3 26.6 - 33.0 pg   MCHC 31.7 31.5 - 35.7 g/dL   RDW 14.3 12.3 - 15.4 %   Platelets 226 150 - 379 x10E3/uL   Neutrophils 87 Not Estab. %   Lymphs 6 Not Estab. %   Monocytes 6 Not Estab. %   Eos 0 Not Estab. %   Basos 0 Not  Estab. %   Neutrophils Absolute 8.3 (H) 1.4 - 7.0 x10E3/uL   Lymphocytes Absolute 0.6 (L) 0.7 - 3.1 x10E3/uL   Monocytes Absolute 0.5 0.1 - 0.9 x10E3/uL   EOS (ABSOLUTE) 0.0 0.0 - 0.4 x10E3/uL   Basophils Absolute 0.0 0.0 - 0.2 x10E3/uL   Immature Granulocytes 1 Not Estab. %   Immature Grans (Abs) 0.1 0.0 - 0.1 x10E3/uL  Lipid Panel w/o Chol/HDL Ratio  Result Value Ref Range   Cholesterol, Total 233 (H) 100 - 199 mg/dL   Triglycerides 153 (H) 0 - 149 mg/dL   HDL 111 >39 mg/dL   VLDL Cholesterol Cal 31 5 - 40 mg/dL   LDL Calculated 91 0 - 99 mg/dL      Assessment & Plan:   Problem List Items Addressed This Visit      Unprioritized   Hypertensive CKD (chronic kidney disease)    Stable, continue present medications.        Relevant Orders   Comprehensive metabolic panel   Lipid Panel w/o Chol/HDL Ratio   Temporal arteritis (HCC)    Vertigo may be associated.  Followed by rheumatoloy and has an appt later this month. She will discuss with them      Vertigo    Discussed using Flonase more consistently.  Symptoms consistent with BPV.  Also discuss with rheumatology          Follow up plan: Return in about 6 months (around 07/19/2018).

## 2018-01-16 NOTE — Assessment & Plan Note (Signed)
Vertigo may be associated.  Followed by rheumatoloy and has an appt later this month. She will discuss with them

## 2018-01-17 ENCOUNTER — Encounter: Payer: Self-pay | Admitting: Unknown Physician Specialty

## 2018-01-17 LAB — COMPREHENSIVE METABOLIC PANEL
ALT: 14 IU/L (ref 0–32)
AST: 14 IU/L (ref 0–40)
Albumin/Globulin Ratio: 1.7 (ref 1.2–2.2)
Albumin: 4 g/dL (ref 3.5–4.7)
Alkaline Phosphatase: 58 IU/L (ref 39–117)
BUN/Creatinine Ratio: 14 (ref 12–28)
BUN: 16 mg/dL (ref 8–27)
Bilirubin Total: 0.4 mg/dL (ref 0.0–1.2)
CO2: 24 mmol/L (ref 20–29)
Calcium: 9.8 mg/dL (ref 8.7–10.3)
Chloride: 101 mmol/L (ref 96–106)
Creatinine, Ser: 1.11 mg/dL — ABNORMAL HIGH (ref 0.57–1.00)
GFR calc Af Amer: 53 mL/min/{1.73_m2} — ABNORMAL LOW (ref >59)
GFR calc non Af Amer: 46 mL/min/{1.73_m2} — ABNORMAL LOW (ref >59)
Globulin, Total: 2.3 g/dL (ref 1.5–4.5)
Glucose: 107 mg/dL — ABNORMAL HIGH (ref 65–99)
Potassium: 4.1 mmol/L (ref 3.5–5.2)
Sodium: 142 mmol/L (ref 134–144)
Total Protein: 6.3 g/dL (ref 6.0–8.5)

## 2018-01-17 LAB — LIPID PANEL W/O CHOL/HDL RATIO
Cholesterol, Total: 228 mg/dL — ABNORMAL HIGH (ref 100–199)
HDL: 93 mg/dL (ref >39)
LDL Calculated: 109 mg/dL — ABNORMAL HIGH (ref 0–99)
Triglycerides: 129 mg/dL (ref 0–149)
VLDL Cholesterol Cal: 26 mg/dL (ref 5–40)

## 2018-02-07 DIAGNOSIS — M316 Other giant cell arteritis: Secondary | ICD-10-CM | POA: Diagnosis not present

## 2018-02-07 DIAGNOSIS — Z79899 Other long term (current) drug therapy: Secondary | ICD-10-CM | POA: Diagnosis not present

## 2018-02-07 DIAGNOSIS — R42 Dizziness and giddiness: Secondary | ICD-10-CM | POA: Diagnosis not present

## 2018-03-21 DIAGNOSIS — M316 Other giant cell arteritis: Secondary | ICD-10-CM | POA: Diagnosis not present

## 2018-03-21 DIAGNOSIS — Z79899 Other long term (current) drug therapy: Secondary | ICD-10-CM | POA: Diagnosis not present

## 2018-03-24 ENCOUNTER — Telehealth: Payer: Self-pay

## 2018-03-24 ENCOUNTER — Telehealth: Payer: Self-pay | Admitting: Unknown Physician Specialty

## 2018-03-24 MED ORDER — SPIRONOLACTONE 50 MG PO TABS
50.0000 mg | ORAL_TABLET | Freq: Every day | ORAL | 1 refills | Status: DC
Start: 2018-03-24 — End: 2018-08-01

## 2018-03-24 MED ORDER — SPIRONOLACTONE 50 MG PO TABS: 50.0000 mg | ORAL_TABLET | Freq: Every day | ORAL | 1 refills | Status: DC

## 2018-03-24 NOTE — Telephone Encounter (Signed)
Aldactone refill Last Refill: 09/19/17; # 90, RF x 1 Last OV: 01/16/18 PCP: [place] [place]  Refilled per protocol.

## 2018-03-24 NOTE — Telephone Encounter (Signed)
Copied from [place] [id number]. Topic: Quick Communication - Rx Refill/Question >> Mar 24, 2018  9:43 AM Synthia Innocent wrote: Medication: spironolactone (ALDACTONE) 50 MG tablet  Has the patient contacted their pharmacy? Yes.   (Agent: If no, request that the patient contact the pharmacy for the refill.) (Agent: If yes, when and what did the pharmacy advise?)  Preferred Pharmacy (with phone number or street name): [facility]  Agent: Please be advised that RX refills may take up to 3 business days. We ask that you follow-up with your pharmacy.

## 2018-03-24 NOTE — Telephone Encounter (Signed)
Spironolactone 50mg  Take one tablet daily  [facility]

## 2018-03-24 NOTE — Telephone Encounter (Signed)
Rx sent 

## 2018-04-01 DIAGNOSIS — Z23 Encounter for immunization: Secondary | ICD-10-CM | POA: Diagnosis not present

## 2018-05-04 DIAGNOSIS — H353131 Nonexudative age-related macular degeneration, bilateral, early dry stage: Secondary | ICD-10-CM | POA: Diagnosis not present

## 2018-05-10 DIAGNOSIS — M316 Other giant cell arteritis: Secondary | ICD-10-CM | POA: Diagnosis not present

## 2018-05-10 DIAGNOSIS — Z79899 Other long term (current) drug therapy: Secondary | ICD-10-CM | POA: Diagnosis not present

## 2018-05-31 ENCOUNTER — Other Ambulatory Visit: Payer: Self-pay

## 2018-06-19 ENCOUNTER — Other Ambulatory Visit: Payer: Self-pay | Admitting: Unknown Physician Specialty

## 2018-06-21 DIAGNOSIS — M316 Other giant cell arteritis: Secondary | ICD-10-CM | POA: Diagnosis not present

## 2018-06-21 DIAGNOSIS — Z79899 Other long term (current) drug therapy: Secondary | ICD-10-CM | POA: Diagnosis not present

## 2018-07-06 ENCOUNTER — Ambulatory Visit: Payer: Medicare Other

## 2018-07-25 ENCOUNTER — Ambulatory Visit: Payer: Medicare Other | Admitting: Nurse Practitioner

## 2018-07-26 ENCOUNTER — Encounter: Payer: Self-pay | Admitting: Family Medicine

## 2018-07-26 ENCOUNTER — Ambulatory Visit (INDEPENDENT_AMBULATORY_CARE_PROVIDER_SITE_OTHER): Payer: Medicare Other | Admitting: Family Medicine

## 2018-07-26 VITALS — BP 130/78 | HR 70 | Temp 98.6°F | Ht 64.0 in | Wt 195.6 lb

## 2018-07-26 DIAGNOSIS — R002 Palpitations: Secondary | ICD-10-CM

## 2018-07-26 DIAGNOSIS — N183 Chronic kidney disease, stage 3 unspecified: Secondary | ICD-10-CM

## 2018-07-26 DIAGNOSIS — M316 Other giant cell arteritis: Secondary | ICD-10-CM

## 2018-07-26 DIAGNOSIS — K219 Gastro-esophageal reflux disease without esophagitis: Secondary | ICD-10-CM

## 2018-07-26 DIAGNOSIS — Z862 Personal history of diseases of the blood and blood-forming organs and certain disorders involving the immune mechanism: Secondary | ICD-10-CM | POA: Insufficient documentation

## 2018-07-26 DIAGNOSIS — E559 Vitamin D deficiency, unspecified: Secondary | ICD-10-CM

## 2018-07-26 DIAGNOSIS — R739 Hyperglycemia, unspecified: Secondary | ICD-10-CM | POA: Diagnosis not present

## 2018-07-26 DIAGNOSIS — Z853 Personal history of malignant neoplasm of breast: Secondary | ICD-10-CM

## 2018-07-26 DIAGNOSIS — Z1231 Encounter for screening mammogram for malignant neoplasm of breast: Secondary | ICD-10-CM

## 2018-07-26 NOTE — Assessment & Plan Note (Signed)
Check CBC 

## 2018-07-26 NOTE — Patient Instructions (Addendum)
We'll get labs today Please do call to schedule your mammogram; the number to schedule one at either [facility] or [facility] is [phone] If you have not heard anything from my staff in a week about any orders/referrals/studies from today, please contact us here to follow-up [phone]  If you have palpitations again, please call and let me know If you develop chest pain, call 911 Try to limit saturated fats in your diet (bologna, hot dogs, barbeque, cheeseburgers, hamburgers, steak, bacon, sausage, cheese, etc.) and get more fresh fruits, vegetables, and whole grains  Check out the information at familydoctor.org entitled "Nutrition for Weight Loss: What You Need to Know about Fad Diets" Try to lose between 0.5 to 1 pounds per week by taking in fewer calories and burning off more calories You can succeed by limiting portions, limiting foods dense in calories and fat, becoming more active, and drinking 8 glasses of water a day (64 ounces) Don't skip meals, especially breakfast, as skipping meals may alter your metabolism Do not use over-the-counter weight loss pills or gimmicks that claim rapid weight loss A healthy BMI (or body mass index) is between 18.5 and 24.9 You can calculate your ideal BMI at the [place] website ClubMonetize.fr   Preventing Unhealthy Weight Gain, Adult Staying at a healthy weight is important to your overall health. When fat builds up in your body, you may become overweight or obese. Being overweight or obese increases your risk of developing certain health problems, such as heart disease, diabetes, sleeping problems, joint problems, and some types of cancer. Unhealthy weight gain is often the result of making unhealthy food choices or not getting enough exercise. You can make changes to your lifestyle to prevent obesity and stay as healthy as possible. What nutrition changes can be  made?   Eat only as much as your body needs. To do this: ? Pay attention to signs that you are hungry or full. Stop eating as soon as you feel full. ? If you feel hungry, try drinking water first before eating. Drink enough water so your urine is clear or pale yellow. ? Eat smaller portions. Pay attention to portion sizes when eating out. ? Look at serving sizes on food labels. Most foods contain more than one serving per container. ? Eat the recommended number of calories for your gender and activity level. For most active people, a daily total of 2,000 calories is appropriate. If you are trying to lose weight or are not very active, you may need to eat fewer calories. Talk with your health care provider or a diet and nutrition specialist (dietitian) about how many calories you need each day.  Choose healthy foods, such as: ? Fruits and vegetables. At each meal, try to fill at least half of your plate with fruits and vegetables. ? Whole grains, such as whole-wheat bread, brown rice, and quinoa. ? Lean meats, such as chicken or fish. ? Other healthy proteins, such as beans, eggs, or tofu. ? Healthy fats, such as nuts, seeds, fatty fish, and olive oil. ? Low-fat or fat-free dairy products.  Check food labels, and avoid food and drinks that: ? Are high in calories. ? Have added sugar. ? Are high in sodium. ? Have saturated fats or trans fats.  Cook foods in healthier ways, such as by baking, broiling, or grilling.  Make a meal plan for the week, and shop with a grocery list to help you stay on track with your purchases.  Try to avoid going to the grocery store when you are hungry.  When grocery shopping, try to shop around the outside of the store first, where the fresh foods are. Doing this helps you to avoid prepackaged foods, which can be high in sugar, salt (sodium), and fat. What lifestyle changes can be made?   Exercise for 30 or more minutes on 5 or more days each week. Exercising  may include brisk walking, yard work, biking, running, swimming, and team sports like basketball and soccer. Ask your health care provider which exercises are safe for you.  Do muscle-strengthening activities, such as lifting weights or using resistance bands, on 2 or more days a week.  Do not use any products that contain nicotine or tobacco, such as cigarettes and e-cigarettes. If you need help quitting, ask your health care provider.  Limit alcohol intake to no more than 1 drink a day for nonpregnant women and 2 drinks a day for men. One drink equals 12 oz of beer, 5 oz of wine, or 1 oz of hard liquor.  Try to get 7-9 hours of sleep each night. What other changes can be made?  Keep a food and activity journal to keep track of: ? What you ate and how many calories you had. Remember to count the calories in sauces, dressings, and side dishes. ? Whether you were active, and what exercises you did. ? Your calorie, weight, and activity goals.  Check your weight regularly. Track any changes. If you notice you have gained weight, make changes to your diet or activity routine.  Avoid taking weight-loss medicines or supplements. Talk to your health care provider before starting any new medicine or supplement.  Talk to your health care provider before trying any new diet or exercise plan. Why are these changes important? Eating healthy, staying active, and having healthy habits can help you to prevent obesity. Those changes also:  Help you manage stress and emotions.  Help you connect with friends and family.  Improve your self-esteem.  Improve your sleep.  Prevent long-term health problems. What can happen if changes are not made? Being obese or overweight can cause you to develop joint or bone problems, which can make it hard for you to stay active or do activities you enjoy. Being obese or overweight also puts stress on your heart and lungs and can lead to health problems like  diabetes, heart disease, and some cancers. Where to find more information Talk with your health care provider or a dietitian about healthy eating and healthy lifestyle choices. You may also find information from:  U.S. Department of Agriculture, MyPlate: FormerBoss.no  American Heart Association: www.heart.org  Centers for Disease Control and Prevention: http://www.wolf.info/ Summary  Staying at a healthy weight is important to your overall health. It helps you to prevent certain diseases and health problems, such as heart disease, diabetes, joint problems, sleep disorders, and some types of cancer.  Being obese or overweight can cause you to develop joint or bone problems, which can make it hard for you to stay active or do activities you enjoy.  You can prevent unhealthy weight gain by eating a healthy diet, exercising regularly, not smoking, limiting alcohol, and getting enough sleep.  Talk with your health care provider or a dietitian for guidance about healthy eating and healthy lifestyle choices. This information is not intended to replace advice given to you by your health care provider. Make sure you discuss any questions you have with your health care  provider. Document Released: 06/29/2016 Document Revised: 04/08/2017 Document Reviewed: 08/04/2016 Elsevier Interactive Patient Education  2019 [employer].

## 2018-07-26 NOTE — Assessment & Plan Note (Signed)
Order mammogram °

## 2018-07-26 NOTE — Assessment & Plan Note (Signed)
Seeing Dr. Meda Coffee; tolerating prednisone well

## 2018-07-26 NOTE — Assessment & Plan Note (Signed)
Avoid NSAIDs, try to increase water intake a little; check Cr and GFR

## 2018-07-26 NOTE — Assessment & Plan Note (Signed)
Check glucose and A1c today

## 2018-07-26 NOTE — Assessment & Plan Note (Signed)
No issues with prednisone per patient; cautioned about ulcer, take OTC if any sx; watch stools, notify us right away of any dark stools

## 2018-07-26 NOTE — Progress Notes (Signed)
BP 130/78   Pulse 70   Temp 98.6 F (37 C) (Oral)   Ht 5\' 4"  (1.626 m)   Wt 195 lb 9.6 oz (88.7 kg)   LMP  (LMP Unknown)   SpO2 94%   BMI 33.57 kg/m    Subjective:    Patient ID: Veronica Colon, female    DOB: Sep 21, 1931, 85 y.o.   MRN: [id number]  HPI: Veronica Colon is a 85 y.o. female  Chief Complaint  Patient presents with  . New Patient (Initial Visit)    HPI Here to establish care  Elevated sugars on the prednisone, normal prior to Apri 2018 Grandmother and aunts had diabetes, but neither No dry mouth  Temporal arteritis; reviewed CRP, went up to 36 in Dec and increased prednisone from 7 mg to 8 mg No stomach issues on the prednisone; she was taking OTC medicine to help prevent burning Due to go back later this month  CKD stage 3; not taking any NSAIDs; fair water drinker  High cholesterol, but a lot is HDL; not much fried foods; some cheese; not more than 3 eggs per week; cereal in the mornings mostly  Hx of anemia; good energy; fair eater; eats broccoli, turnip greens, spinach  Hx of breast cancer; was getting yearly mammograms  DEXA July 2018; normal, reviewed in [facility] system  Feeling her heart fluttering for about a week; no chest pain at all; thinks it is her nerves; no hx of previous heart trouble; occasional eyelid twitch; feels like hands crawling; no recent cramps in calves  Depression screen [facility] 2/9 07/26/2018 01/16/2018 07/01/2017 03/01/2017 06/18/2016  Decreased Interest 0 1 0 0 0  Down, Depressed, Hopeless 0 0 0 0 0  PHQ - 2 Score 0 1 0 0 0  Altered sleeping 0 0 - 0 -  Tired, decreased energy 0 1 - 1 -  Change in appetite 0 0 - 0 -  Feeling bad or failure about yourself  0 0 - 0 -  Trouble concentrating 0 0 - 0 -  Moving slowly or fidgety/restless 0 0 - 0 -  Suicidal thoughts 0 0 - 0 -  PHQ-9 Score 0 2 - 1 -  Difficult doing work/chores Not difficult at all - - - -   Fall Risk  07/26/2018 05/31/2018 07/01/2017 05/18/2016 04/20/2016  Falls in the  past year? 0 0 No No No  Comment - Emmi Telephone Survey: data to providers prior to load - - -  Number falls in past yr: 0 - - - -  Injury with Fall? 0 - - - -    Relevant past medical, surgical, family and social history reviewed Past Medical History:  Diagnosis Date  . Allergy   . Breast cancer ([place]) 1990   LT MASTECTOMY  . Cancer ([place]) 1990   LT BREAST MASTECTOMY  . GERD (gastroesophageal reflux disease)   . Hypertension   . Personal history of chemotherapy   . Status post chemotherapy    breast cancer  . Temporal arteritis ([place]) 10/28/2016   Overview:  Right temporal arteritis  Biopsy proven 11/08/2016   Past Surgical History:  Procedure Laterality Date  . ABDOMINAL HYSTERECTOMY    . BREAST SURGERY    . COLONOSCOPY WITH PROPOFOL N/A 06/04/2015   Procedure: COLONOSCOPY WITH PROPOFOL;  Surgeon: Manya Silvas, MD;  Location: [facility] ENDOSCOPY;  Service: Endoscopy;  Laterality: N/A;  . MASTECTOMY Left 1990   BREAST CA  . TEMPORAL ARTERY BIOPSY /  LIGATION  11/2016   Family History  Problem Relation Age of Onset  . Breast cancer Mother 49  . Cancer Mother        breast  . Cancer Father        lung   Social History   Tobacco Use  . Smoking status: Never Smoker  . Smokeless tobacco: Never Used  Substance Use Topics  . Alcohol use: No    Alcohol/week: 0.0 standard drinks  . Drug use: No     Office Visit from 07/26/2018 in [facility]  AUDIT-C Score  0      Interim medical history since last visit reviewed. Allergies and medications reviewed  Review of Systems Per HPI unless specifically indicated above     Objective:    BP 130/78   Pulse 70   Temp 98.6 F (37 C) (Oral)   Ht 5\' 4"  (1.626 m)   Wt 195 lb 9.6 oz (88.7 kg)   LMP  (LMP Unknown)   SpO2 94%   BMI 33.57 kg/m   Wt Readings from Last 3 Encounters:  07/26/18 195 lb 9.6 oz (88.7 kg)  01/16/18 199 lb 12.8 oz (90.6 kg)  07/15/17 213 lb 9.6 oz (96.9 kg)    Physical  Exam Constitutional:      General: She is not in acute distress.    Appearance: She is well-developed. She is not diaphoretic.  HENT:     Head: Normocephalic and atraumatic.  Eyes:     General: No scleral icterus.    Comments: No pallor  Neck:     Thyroid: No thyromegaly.  Cardiovascular:     Rate and Rhythm: Normal rate and regular rhythm.  No extrasystoles are present.    Heart sounds: Normal heart sounds. No murmur.  Pulmonary:     Effort: Pulmonary effort is normal. No respiratory distress.     Breath sounds: Normal breath sounds. No wheezing.  Abdominal:     General: Bowel sounds are normal. There is no distension.     Palpations: Abdomen is soft.  Skin:    General: Skin is warm and dry.     Coloration: Skin is not pale.     Comments: Fingernail polish on  Neurological:     Mental Status: She is alert.  Psychiatric:        Mood and Affect: Mood is not anxious or depressed.        Behavior: Behavior normal.        Thought Content: Thought content normal.        Judgment: Judgment normal.     Results for orders placed or performed in visit on 01/16/18  Comprehensive metabolic panel  Result Value Ref Range   Glucose 107 (H) 65 - 99 mg/dL   BUN 16 8 - 27 mg/dL   Creatinine, Ser 1.11 (H) 0.57 - 1.00 mg/dL   GFR calc non Af Amer 46 (L) >59 mL/min/1.73   GFR calc Af Amer 53 (L) >59 mL/min/1.73   BUN/Creatinine Ratio 14 12 - 28   Sodium 142 134 - 144 mmol/L   Potassium 4.1 3.5 - 5.2 mmol/L   Chloride 101 96 - 106 mmol/L   CO2 24 20 - 29 mmol/L   Calcium 9.8 8.7 - 10.3 mg/dL   Total Protein 6.3 6.0 - 8.5 g/dL   Albumin 4.0 3.5 - 4.7 g/dL   Globulin, Total 2.3 1.5 - 4.5 g/dL   Albumin/Globulin Ratio 1.7 1.2 - 2.2   Bilirubin  Total 0.4 0.0 - 1.2 mg/dL   Alkaline Phosphatase 58 39 - 117 IU/L   AST 14 0 - 40 IU/L   ALT 14 0 - 32 IU/L  Lipid Panel w/o Chol/HDL Ratio  Result Value Ref Range   Cholesterol, Total 228 (H) 100 - 199 mg/dL   Triglycerides 129 0 - 149 mg/dL    HDL 93 >39 mg/dL   VLDL Cholesterol Cal 26 5 - 40 mg/dL   LDL Calculated 109 (H) 0 - 99 mg/dL   EKG; sinus brady, RR interval >1000 msec, normal axis, no ST-T wave changes    Assessment & Plan:   Problem List Items Addressed This Visit      Cardiovascular and Mediastinum   Temporal arteritis (HCC) - Primary    Seeing Dr. Meda Coffee; tolerating prednisone well      Relevant Orders   CBC with Differential/Platelet   COMPLETE METABOLIC PANEL WITH GFR     Digestive   GERD (gastroesophageal reflux disease)    No issues with prednisone per patient; cautioned about ulcer, take OTC if any sx; watch stools, notify us right away of any dark stools        Genitourinary   CKD (chronic kidney disease), stage III (HCC)    Avoid NSAIDs, try to increase water intake a little; check Cr and GFR      Relevant Orders   COMPLETE METABOLIC PANEL WITH GFR     Other   Hyperglycemia    Check glucose and A1c today      Relevant Orders   Hemoglobin A1c   Hx of iron deficiency anemia    Check CBC      H/O malignant neoplasm of breast    Order mammogram      Relevant Orders   MM 3D SCREEN BREAST BILATERAL    Other Visit Diagnoses    Breast cancer screening by mammogram       Relevant Orders   MM 3D SCREEN BREAST BILATERAL   Vitamin D deficiency       Relevant Orders   VITAMIN D 25 Hydroxy (Vit-D Deficiency, Fractures)   Palpitation       Relevant Orders   Magnesium   TSH   EKG 12-Lead       Follow up plan: No follow-ups on file.  An after-visit summary was printed and given to the patient at [facility].  Please see the patient instructions which may contain other information and recommendations beyond what is mentioned above in the assessment and plan.  No orders of the defined types were placed in this encounter.   Orders Placed This Encounter  Procedures  . MM 3D SCREEN BREAST BILATERAL  . CBC with Differential/Platelet  . COMPLETE METABOLIC PANEL WITH GFR  .  Hemoglobin A1c  . VITAMIN D 25 Hydroxy (Vit-D Deficiency, Fractures)  . Magnesium  . TSH  . EKG 12-Lead

## 2018-07-27 ENCOUNTER — Telehealth: Payer: Self-pay | Admitting: Nurse Practitioner

## 2018-07-27 LAB — COMPLETE METABOLIC PANEL WITH GFR
AG Ratio: 1.7 (calc) (ref 1.0–2.5)
ALT: 13 U/L (ref 6–29)
AST: 17 U/L (ref 10–35)
Albumin: 3.9 g/dL (ref 3.6–5.1)
Alkaline phosphatase (APISO): 55 U/L (ref 33–130)
BUN/Creatinine Ratio: 20 (calc) (ref 6–22)
BUN: 20 mg/dL (ref 7–25)
CO2: 27 mmol/L (ref 20–32)
Calcium: 9.6 mg/dL (ref 8.6–10.4)
Chloride: 101 mmol/L (ref 98–110)
Creat: 1.02 mg/dL — ABNORMAL HIGH (ref 0.60–0.88)
GFR, Est African American: 58 mL/min/{1.73_m2} — ABNORMAL LOW (ref >=60)
GFR, Est Non African American: 50 mL/min/{1.73_m2} — ABNORMAL LOW (ref >=60)
Globulin: 2.3 g/dL (calc) (ref 1.9–3.7)
Glucose, Bld: 109 mg/dL — ABNORMAL HIGH (ref 65–99)
Potassium: 4.4 mmol/L (ref 3.5–5.3)
Sodium: 139 mmol/L (ref 135–146)
Total Bilirubin: 0.5 mg/dL (ref 0.2–1.2)
Total Protein: 6.2 g/dL (ref 6.1–8.1)

## 2018-07-27 LAB — CBC WITH DIFFERENTIAL/PLATELET
Absolute Monocytes: 655 cells/uL (ref 200–950)
Basophils Absolute: 33 cells/uL (ref 0–200)
Basophils Relative: 0.3 %
Eosinophils Absolute: 11 cells/uL — ABNORMAL LOW (ref 15–500)
Eosinophils Relative: 0.1 %
HCT: 37.9 % (ref 35.0–45.0)
Hemoglobin: 12.1 g/dL (ref 11.7–15.5)
Lymphs Abs: 833 cells/uL — ABNORMAL LOW (ref 850–3900)
MCH: 26.4 pg — ABNORMAL LOW (ref 27.0–33.0)
MCHC: 31.9 g/dL — ABNORMAL LOW (ref 32.0–36.0)
MCV: 82.8 fL (ref 80.0–100.0)
MPV: 10.4 fL (ref 7.5–12.5)
Monocytes Relative: 5.9 %
Neutro Abs: 9568 cells/uL — ABNORMAL HIGH (ref 1500–7800)
Neutrophils Relative %: 86.2 %
Platelets: 269 10*3/uL (ref 140–400)
RBC: 4.58 10*6/uL (ref 3.80–5.10)
RDW: 12.9 % (ref 11.0–15.0)
Total Lymphocyte: 7.5 %
WBC: 11.1 10*3/uL — ABNORMAL HIGH (ref 3.8–10.8)

## 2018-07-27 LAB — MAGNESIUM: Magnesium: 1.8 mg/dL (ref 1.5–2.5)

## 2018-07-27 LAB — HEMOGLOBIN A1C
Hgb A1c MFr Bld: 6.8 % of total Hgb — ABNORMAL HIGH (ref <5.7)
Mean Plasma Glucose: 148 (calc)
eAG (mmol/L): 8.2 (calc)

## 2018-07-27 LAB — TSH: TSH: 1.4 mIU/L (ref 0.40–4.50)

## 2018-07-27 LAB — VITAMIN D 25 HYDROXY (VIT D DEFICIENCY, FRACTURES): Vit D, 25-Hydroxy: 31 ng/mL (ref 30–100)

## 2018-07-27 NOTE — Telephone Encounter (Signed)
Dr. Sanda Klein is out for the day and I am watching her basket. Patients white blood cells and neutrophils came back slightly elevated. Which likely indicates she is fighting off some infection- it may be mild like a cold but please ask if she is having any fevers, chills, fatigue, palpitations, weakness, abdominal pain- please have her go to ER. If she if having cough, pain when she pees, sore throat or mild illness please have her come in here or to urgent care to be evaluated. If she is having no symptoms we can just monitor and re-check later.

## 2018-07-27 NOTE — Telephone Encounter (Signed)
Left detailed voicemail

## 2018-07-27 NOTE — Telephone Encounter (Signed)
CRM created. 

## 2018-08-01 ENCOUNTER — Ambulatory Visit (INDEPENDENT_AMBULATORY_CARE_PROVIDER_SITE_OTHER): Payer: Medicare Other | Admitting: Family Medicine

## 2018-08-01 ENCOUNTER — Telehealth: Payer: Self-pay | Admitting: Family Medicine

## 2018-08-01 ENCOUNTER — Encounter: Payer: Self-pay | Admitting: Family Medicine

## 2018-08-01 VITALS — BP 120/72 | HR 69 | Temp 98.0°F | Ht 64.0 in | Wt 197.3 lb

## 2018-08-01 DIAGNOSIS — D72829 Elevated white blood cell count, unspecified: Secondary | ICD-10-CM

## 2018-08-01 DIAGNOSIS — E1169 Type 2 diabetes mellitus with other specified complication: Secondary | ICD-10-CM | POA: Diagnosis not present

## 2018-08-01 DIAGNOSIS — R739 Hyperglycemia, unspecified: Secondary | ICD-10-CM

## 2018-08-01 DIAGNOSIS — M316 Other giant cell arteritis: Secondary | ICD-10-CM | POA: Diagnosis not present

## 2018-08-01 DIAGNOSIS — E119 Type 2 diabetes mellitus without complications: Secondary | ICD-10-CM

## 2018-08-01 DIAGNOSIS — E669 Obesity, unspecified: Secondary | ICD-10-CM

## 2018-08-01 LAB — CBC WITH DIFFERENTIAL/PLATELET
Absolute Monocytes: 663 cells/uL (ref 200–950)
Basophils Absolute: 30 cells/uL (ref 0–200)
Basophils Relative: 0.3 %
Eosinophils Absolute: 10 cells/uL — ABNORMAL LOW (ref 15–500)
Eosinophils Relative: 0.1 %
HCT: 36.9 % (ref 35.0–45.0)
Hemoglobin: 11.7 g/dL (ref 11.7–15.5)
Lymphs Abs: 861 cells/uL (ref 850–3900)
MCH: 25.9 pg — ABNORMAL LOW (ref 27.0–33.0)
MCHC: 31.7 g/dL — ABNORMAL LOW (ref 32.0–36.0)
MCV: 81.6 fL (ref 80.0–100.0)
MPV: 10.2 fL (ref 7.5–12.5)
Monocytes Relative: 6.7 %
Neutro Abs: 8336 cells/uL — ABNORMAL HIGH (ref 1500–7800)
Neutrophils Relative %: 84.2 %
Platelets: 281 10*3/uL (ref 140–400)
RBC: 4.52 10*6/uL (ref 3.80–5.10)
RDW: 12.7 % (ref 11.0–15.0)
Total Lymphocyte: 8.7 %
WBC: 9.9 10*3/uL (ref 3.8–10.8)

## 2018-08-01 LAB — POCT GLYCOSYLATED HEMOGLOBIN (HGB A1C)
HbA1c POC (<> result, manual entry): 6.9 % (ref 4.0–5.6)
HbA1c, POC (controlled diabetic range): 6.9 % (ref 0.0–7.0)
HbA1c, POC (prediabetic range): 6.9 % — AB (ref 5.7–6.4)
Hemoglobin A1C: 6.9 % — AB (ref 4.0–5.6)

## 2018-08-01 MED ORDER — METFORMIN HCL ER 500 MG PO TB24: 500.0000 mg | ORAL_TABLET | Freq: Every day | ORAL | 2 refills | Status: DC

## 2018-08-01 MED ORDER — FLUTICASONE PROPIONATE 50 MCG/ACT NA SUSP: 2.0000 | Freq: Every day | NASAL | 3 refills | Status: DC

## 2018-08-01 MED ORDER — SPIRONOLACTONE 50 MG PO TABS: 50.0000 mg | ORAL_TABLET | Freq: Every day | ORAL | 1 refills | Status: DC

## 2018-08-01 NOTE — Telephone Encounter (Signed)
Info faxed

## 2018-08-01 NOTE — Assessment & Plan Note (Signed)
On prednisone therapy; may have impacted her glucose over time; will notify her rheumatologist that she now has type 2 diabetes, well-controlled

## 2018-08-01 NOTE — Telephone Encounter (Signed)
Please send my office note from today to Dr. Annalee Genta and Dr. George Ina to let them know about her new diagnosis of type 2 diabetes Thank you

## 2018-08-01 NOTE — Progress Notes (Signed)
BP 120/72   Pulse 69   Temp 98 F (36.7 C) (Oral)   Ht 5\' 4"  (1.626 m)   Wt 197 lb 4.8 oz (89.5 kg)   LMP  (LMP Unknown)   SpO2 98%   BMI 33.87 kg/m    Subjective:    Patient ID: Veronica Colon, female    DOB: [birth year], 85 y.o.   MRN: [id number]  HPI: Veronica Colon is a 85 y.o. female  Chief Complaint  Patient presents with  . Follow-up    abnormal labs    HPI Here for f/u with her daughter; she established care at the last visit and had some abnormal labs  Mildly elevated WBC; no fevers, no boils, no cough, no burning with urination Temporal arteritis; on prednisone  She is trying to eat better; kale salad; spinach, doing better with iron in diet  Depressed GFR; no NSAIDs; good water drinker  Elevated A1c, neither parent had diabetes; no dry mouth or frequent urination A1c was 6.8 A cookie after dinner, nothing excessive; no sweet tea, some Gatorade, mostly water, occasionally Dr. Malachi Bonds, not regular soda drinker  She does not want to see a cardiologist; no chest pain; occasional flutter; told her last NP the same thing  Depression screen [facility] 2/9 08/01/2018 07/26/2018 01/16/2018 07/01/2017 03/01/2017  Decreased Interest 0 0 1 0 0  Down, Depressed, Hopeless 0 0 0 0 0  PHQ - 2 Score 0 0 1 0 0  Altered sleeping 0 0 0 - 0  Tired, decreased energy 0 0 1 - 1  Change in appetite 0 0 0 - 0  Feeling bad or failure about yourself  0 0 0 - 0  Trouble concentrating 0 0 0 - 0  Moving slowly or fidgety/restless 0 0 0 - 0  Suicidal thoughts 0 0 0 - 0  PHQ-9 Score 0 0 2 - 1  Difficult doing work/chores Not difficult at all Not difficult at all - - -   Fall Risk  08/01/2018 07/26/2018 05/31/2018 07/01/2017 05/18/2016  Falls in the past year? 0 0 0 No No  Comment - - Emmi Telephone Survey: data to providers prior to load - -  Number falls in past yr: 0 0 - - -  Injury with Fall? 0 0 - - -    Relevant past medical, surgical, family and social history reviewed Past Medical  History:  Diagnosis Date  . Allergy   . Breast cancer ([place]) 1990   LT MASTECTOMY  . Cancer ([place]) 1990   LT BREAST MASTECTOMY  . GERD (gastroesophageal reflux disease)   . Hypertension   . Personal history of chemotherapy   . Status post chemotherapy    breast cancer  . Temporal arteritis ([place]) 10/28/2016   Overview:  Right temporal arteritis  Biopsy proven 11/08/2016   Past Surgical History:  Procedure Laterality Date  . ABDOMINAL HYSTERECTOMY    . BREAST SURGERY    . COLONOSCOPY WITH PROPOFOL N/A 06/04/2015   Procedure: COLONOSCOPY WITH PROPOFOL;  Surgeon: Manya Silvas, MD;  Location: [facility] ENDOSCOPY;  Service: Endoscopy;  Laterality: N/A;  . MASTECTOMY Left 1990   BREAST CA  . TEMPORAL ARTERY BIOPSY / LIGATION  11/2016   Family History  Problem Relation Age of Onset  . Breast cancer Mother 73  . Cancer Mother        breast  . Cancer Father        lung   Social History   Tobacco  Use  . Smoking status: Never Smoker  . Smokeless tobacco: Never Used  Substance Use Topics  . Alcohol use: No    Alcohol/week: 0.0 standard drinks  . Drug use: No     Office Visit from 08/01/2018 in [facility]  AUDIT-C Score  0      Interim medical history since last visit reviewed. Allergies and medications reviewed  Review of Systems Per HPI unless specifically indicated above     Objective:    BP 120/72   Pulse 69   Temp 98 F (36.7 C) (Oral)   Ht 5\' 4"  (1.626 m)   Wt 197 lb 4.8 oz (89.5 kg)   LMP  (LMP Unknown)   SpO2 98%   BMI 33.87 kg/m   Wt Readings from Last 3 Encounters:  08/01/18 197 lb 4.8 oz (89.5 kg)  07/26/18 195 lb 9.6 oz (88.7 kg)  01/16/18 199 lb 12.8 oz (90.6 kg)    Physical Exam Constitutional:      Appearance: She is well-developed. She is obese.  Eyes:     General: No scleral icterus. Cardiovascular:     Rate and Rhythm: Normal rate and regular rhythm.     Pulses:          Dorsalis pedis pulses are 1+ on the right side  and 1+ on the left side.  Pulmonary:     Effort: Pulmonary effort is normal.     Breath sounds: Normal breath sounds.  Musculoskeletal:     Right foot: No deformity.     Left foot: No deformity.  Feet:     Right foot:     Protective Sensation: 5 sites tested. 5 sites sensed.     Skin integrity: No callus.     Left foot:     Protective Sensation: 5 sites tested. 5 sites sensed.     Skin integrity: No callus.  Neurological:     Mental Status: She is alert.  Psychiatric:        Behavior: Behavior normal.     Results for orders placed or performed in visit on 08/01/18  POCT HgB A1C  Result Value Ref Range   Hemoglobin A1C 6.9 (A) 4.0 - 5.6 %   HbA1c POC (<> result, manual entry) 6.9 4.0 - 5.6 %   HbA1c, POC (prediabetic range) 6.9 (A) 5.7 - 6.4 %   HbA1c, POC (controlled diabetic range) 6.9 0.0 - 7.0 %      Assessment & Plan:   Problem List Items Addressed This Visit      Cardiovascular and Mediastinum   Temporal arteritis ([place])    On prednisone therapy; may have impacted her glucose over time; will notify her rheumatologist that she now has type 2 diabetes, well-controlled      Relevant Medications   spironolactone (ALDACTONE) 50 MG tablet     Endocrine   Type 2 diabetes mellitus with obesity ([place])    Confirmed with 2nd elevated A1c; explained diagnosis; refer to diabetic educator; she will learn there how to do FSBS, which we can have her do the first 6 months, then just PRN; first 6 months is a time of education, learning about symptoms, how foods affect sugar, etc.; will let her eye doctor (Dr. George Ina) know about her new diabetes diagnosis; she could not give a urine today for microalbumin:Cr testing, will get at f/u; encouraged weight loss; discussed medicine option of starting metformin, no medicine, compromise and she agrees with one pill and healthier diet  and weight loss; we'll reassess at f/u; my hope is that she loses weight and can stop the medicine; prednisone  is likely a factor, though family hx and obesity also playing a role; nutritional tips given (avoiding "the whites" -- bread, flour, sugar, rice; using G2 instead of regular Gatorade, etc.); return in one month for follow-up      Relevant Medications   metFORMIN (GLUCOPHAGE XR) 500 MG 24 hr tablet     Other   RESOLVED: Hyperglycemia   Relevant Orders   POCT HgB A1C (Completed)    Other Visit Diagnoses    Type 2 diabetes mellitus without complication, without long-term current use of insulin (HCC)    -  Primary   Relevant Medications   metFORMIN (GLUCOPHAGE XR) 500 MG 24 hr tablet   Other Relevant Orders   Amb ref to Medical Nutrition Therapy-MNT   Leukocytosis, unspecified type       no s/s of infection; may be secondary to steroid use; will check CBC again today   Relevant Orders   CBC with Differential/Platelet       Follow up plan: Return in about 1 month (around 09/01/2018) for follow-up visit with Dr. Sanda Klein.  An after-visit summary was printed and given to the patient at [facility].  Please see the patient instructions which may contain other information and recommendations beyond what is mentioned above in the assessment and plan.  Meds ordered this encounter  Medications  . fluticasone (FLONASE) 50 MCG/ACT nasal spray    Sig: Place 2 sprays into both nostrils daily.    Dispense:  48 g    Refill:  3    Okay to leave on file until patient needs this  . spironolactone (ALDACTONE) 50 MG tablet    Sig: Take 1 tablet (50 mg total) by mouth daily.    Dispense:  90 tablet    Refill:  1    Please leave on file until needed; she should have enough until March 2020  . metFORMIN (GLUCOPHAGE XR) 500 MG 24 hr tablet    Sig: Take 1 tablet (500 mg total) by mouth daily with breakfast.    Dispense:  30 tablet    Refill:  2    Orders Placed This Encounter  Procedures  . CBC with Differential/Platelet  . Amb ref to Medical Nutrition Therapy-MNT  . POCT HgB A1C    cc: Drs.  Behalal-Bock, Porfilio

## 2018-08-01 NOTE — Patient Instructions (Addendum)
We'll have you meet with the nutritionist about your new diagnosis of diabetes Try to limit white bread, white rice, white sugar, and white potatoes Try G2 Gatorade instead of regular Gatorade Start the new medicine called metformin Do not take this medicine if you are dehydrated or sick Do not take this medicine for 48 hours after receiving contrast Please do see your eye doctor yearly   Diabetes Mellitus and Standards of Medical Care Managing diabetes (diabetes mellitus) can be complicated. Your diabetes treatment may be managed by a team of health care providers, including:  A physician who specializes in diabetes (endocrinologist).  A nurse practitioner or physician assistant.  Nurses.  A diet and nutrition specialist (registered dietitian).  A certified diabetes educator (CDE).  An exercise specialist.  A pharmacist.  An eye doctor.  A foot specialist (podiatrist).  A dentist.  A primary care provider.  A mental health provider. Your health care providers follow guidelines to help you get the best quality of care. The following schedule is a general guideline for your diabetes management plan. Your health care providers may give you more specific instructions. Physical exams Upon being diagnosed with diabetes mellitus, and each year after that, your health care provider will ask about your medical and family history. He or she will also do a physical exam. Your exam may include:  Measuring your height, weight, and body mass index (BMI).  Checking your blood pressure. This will be done at every routine medical visit. Your target blood pressure may vary depending on your medical conditions, your age, and other factors.  Thyroid gland exam.  Skin exam.  Screening for damage to your nerves (peripheral neuropathy). This may include checking the pulse in your legs and feet and checking the level of sensation in your hands and feet.  A complete foot exam to inspect the  structure and skin of your feet, including checking for cuts, bruises, redness, blisters, sores, or other problems.  Screening for blood vessel (vascular) problems, which may include checking the pulse in your legs and feet and checking your temperature. Blood tests Depending on your treatment plan and your personal needs, you may have the following tests done:  HbA1c (hemoglobin A1c). This test provides information about blood sugar (glucose) control over the previous 2-3 months. It is used to adjust your treatment plan, if needed. This test will be done: ? At least 2 times a year, if you are meeting your treatment goals. ? 4 times a year, if you are not meeting your treatment goals or if treatment goals have changed.  Lipid testing, including total, LDL, and HDL cholesterol and triglyceride levels. ? The goal for LDL is less than 100 mg/dL (5.5 mmol/L). If you are at high risk for complications, the goal is less than 70 mg/dL (3.9 mmol/L). ? The goal for HDL is 40 mg/dL (2.2 mmol/L) or higher for men and 50 mg/dL (2.8 mmol/L) or higher for women. An HDL cholesterol of 60 mg/dL (3.3 mmol/L) or higher gives some protection against heart disease. ? The goal for triglycerides is less than 150 mg/dL (8.3 mmol/L).  Liver function tests.  Kidney function tests.  Thyroid function tests. Dental and eye exams  Visit your dentist two times a year.  If you have type 1 diabetes, your health care provider may recommend an eye exam 3-5 years after you are diagnosed, and then once a year after your first exam. ? For children with type 1 diabetes, a health care provider  may recommend an eye exam when your child is age 53 or older and has had diabetes for 3-5 years. After the first exam, your child should get an eye exam once a year.  If you have type 2 diabetes, your health care provider may recommend an eye exam as soon as you are diagnosed, and then once a year after your first  exam. Immunizations   The yearly flu (influenza) vaccine is recommended for everyone 6 months or older who has diabetes.  The pneumonia (pneumococcal) vaccine is recommended for everyone 2 years or older who has diabetes. If you are 18 or older, you may get the pneumonia vaccine as a series of two separate shots.  The hepatitis B vaccine is recommended for adults shortly after being diagnosed with diabetes.  Adults and children with diabetes should receive all other vaccines according to age-specific recommendations from the Centers for Disease Control and Prevention (CDC). Mental and emotional health Screening for symptoms of eating disorders, anxiety, and depression is recommended at the time of diagnosis and afterward as needed. If your screening shows that you have symptoms (positive screening result), you may need more evaluation and you may work with a mental health care provider. Treatment plan Your treatment plan will be reviewed at every medical visit. You and your health care provider will discuss:  How you are taking your medicines, including insulin.  Any side effects you are experiencing.  Your blood glucose target goals.  The frequency of your blood glucose monitoring.  Lifestyle habits, such as activity level as well as tobacco, alcohol, and substance use. Diabetes self-management education Your health care provider will assess how well you are monitoring your blood glucose levels and whether you are taking your insulin correctly. He or she may refer you to:  A certified diabetes educator to manage your diabetes throughout your life, starting at diagnosis.  A registered dietitian who can create or review your personal nutrition plan.  An exercise specialist who can discuss your activity level and exercise plan. Summary  Managing diabetes (diabetes mellitus) can be complicated. Your diabetes treatment may be managed by a team of health care providers.  Your health  care providers follow guidelines in order to help you get the best quality of care.  Standards of care including having regular physical exams, blood tests, blood pressure monitoring, immunizations, screening tests, and education about how to manage your diabetes.  Your health care providers may also give you more specific instructions based on your individual health. This information is not intended to replace advice given to you by your health care provider. Make sure you discuss any questions you have with your health care provider. Document Released: 04/25/2009 Document Revised: 03/17/2018 Document Reviewed: 03/26/2016 Elsevier Interactive Patient Education  2019 [employer]  Preventing Diabetes Mellitus Complications You can take action to prevent or slow down problems that are caused by diabetes (diabetes mellitus). Following your diabetes plan and taking care of yourself can reduce your risk of serious or life-threatening complications. What actions can I take to prevent diabetes complications? Manage your diabetes   Follow instructions from your health care providers about managing your diabetes. Your diabetes may be managed by a team of health care providers who can teach you how to care for yourself and can answer questions that you have.  Educate yourself about your condition so you can make healthy choices about eating and physical activity.  Check your blood sugar (glucose) levels as often as directed. Your health  care provider will help you decide how often to check your blood glucose level depending on your treatment goals and how well you are meeting them.  Ask your health care provider if you should take low-dose aspirin daily and what dose is recommended for you. Taking low-dose aspirin daily is recommended to help prevent cardiovascular disease. Do not use nicotine or tobacco Do not use any products that contain nicotine or tobacco, such as cigarettes and e-cigarettes. If  you need help quitting, ask your health care provider. Nicotine raises your risk for diabetes problems. If you quit using nicotine:  You will lower your risk for heart attack, stroke, nerve disease, and kidney disease.  Your cholesterol and blood pressure may improve.  Your blood circulation will improve. Keep your blood pressure under control Your personal target blood pressure is determined based on:  Your age.  Your medicines.  How long you have had diabetes.  Any other medical conditions you have. To control your blood pressure:  Follow instructions from your health care provider about meal planning, exercise, and medicines.  Make sure your health care provider checks your blood pressure at every medical visit.  Monitor your blood pressure at home as told by your health care provider.  Keep your cholesterol under control To control your cholesterol:  Follow instructions from your health care provider about meal planning, exercise, and medicines.  Have your cholesterol checked at least once a year.  You may be prescribed medicine to lower cholesterol (statin). If you are not taking a statin, ask your health care provider if you should be. Controlling your cholesterol may:  Help prevent heart disease and stroke. These are the most common health problems for people with diabetes.  Improve your blood flow. Schedule and keep yearly physical exams and eye exams Your health care provider will tell you how often you need medical visits depending on your diabetes management plan. Keep all follow-up visits as directed. This is important so possible problems can be identified early and complications can be avoided or treated.  Every visit with your health care provider should include measuring your: ? Weight. ? Blood pressure. ? Blood glucose control.  Your A1c (hemoglobin A1c) level should be checked: ? At least 2 times a year, if you are meeting your treatment goals. ? 4  times a year, if you are not meeting treatment goals or if your treatment goals have changed.  Your blood lipids (lipid profile) should be checked yearly. You should also be checked yearly for protein in your urine (urine microalbumin).  If you have type 1 diabetes, get an eye exam 3-5 years after you are diagnosed, and then once a year after your first exam.  If you have type 2 diabetes, get an eye exam as soon as you are diagnosed, and then once a year after your first exam. Keep your vaccines current It is recommended that you receive:  A flu (influenza) vaccine every year.  A pneumonia (pneumococcal) vaccine and a hepatitis B vaccine. If you are age 46 or older, you may get the pneumonia vaccine as a series of two separate shots. Ask your health care provider which other vaccines may be recommended. Take care of your feet Diabetes may cause you to have poor blood circulation to your legs and feet. Because of this, taking care of your feet is very important. Diabetes can cause:  The skin on the feet to get thinner, break more easily, and heal more slowly.  Nerve damage  in your legs and feet, which results in decreased feeling. You may not notice minor injuries that could lead to serious problems. To avoid foot problems:  Check your skin and feet every day for cuts, bruises, redness, blisters, or sores.  Schedule a foot exam with your health care provider once every year. This exam includes: ? Inspecting of the structure and skin of your feet. ? Checking the pulses and sensation in your feet.  Make sure that your health care provider performs a visual foot exam at every medical visit.  Take care of your teeth People with poorly controlled diabetes are more likely to have gum (periodontal) disease. Diabetes can make periodontal diseases harder to control. If not treated, periodontal diseases can lead to tooth loss. To prevent this:  Brush your teeth twice a day.  Floss at least  once a day.  Visit your dentist 2 times a year. Drink responsibly Limit alcohol intake to no more than 1 drink a day for nonpregnant women and 2 drinks a day for men. One drink equals 12 oz of beer, 5 oz of wine, or 1 oz of hard liquor.  It is important to eat food when you drink alcohol to avoid low blood glucose (hypoglycemia). Avoid alcohol if you:  Have a history of alcohol abuse or dependence.  Are pregnant.  Have liver disease, pancreatitis, advanced neuropathy, or severe hypertriglyceridemia. Lessen stress Living with diabetes can be stressful. When you are experiencing stress, your blood glucose may be affected in two ways:  Stress hormones may cause your blood glucose to rise.  You may be distracted from taking good care of yourself. Be aware of your stress level and make changes to help you manage challenging situations. To lower your stress levels:  Consider joining a support group.  Do planned relaxation or meditation.  Do a hobby that you enjoy.  Maintain healthy relationships.  Exercise regularly.  Work with your health care provider or a mental health professional. Summary  You can take action to prevent or slow down problems that are caused by diabetes (diabetes mellitus). Following your diabetes plan and taking care of yourself can reduce your risk of serious or life-threatening complications.  Follow instructions from your health care providers about managing your diabetes. Your diabetes may be managed by a team of health care providers who can teach you how to care for yourself and can answer questions that you have.  Your health care provider will tell you how often you need medical visits depending on your diabetes management plan. Keep all follow-up visits as directed. This is important so possible problems can be identified early and complications can be avoided or treated. This information is not intended to replace advice given to you by your health  care provider. Make sure you discuss any questions you have with your health care provider. Document Released: 03/16/2011 Document Revised: 02/15/2017 Document Reviewed: 03/27/2016 Elsevier Interactive Patient Education  2019 [employer].

## 2018-08-01 NOTE — Assessment & Plan Note (Signed)
Confirmed with 2nd elevated A1c; explained diagnosis; refer to diabetic educator; she will learn there how to do FSBS, which we can have her do the first 6 months, then just PRN; first 6 months is a time of education, learning about symptoms, how foods affect sugar, etc.; will let her eye doctor (Dr. George Ina) know about her new diabetes diagnosis; she could not give a urine today for microalbumin:Cr testing, will get at f/u; encouraged weight loss; discussed medicine option of starting metformin, no medicine, compromise and she agrees with one pill and healthier diet and weight loss; we'll reassess at f/u; my hope is that she loses weight and can stop the medicine; prednisone is likely a factor, though family hx and obesity also playing a role; nutritional tips given (avoiding "the whites" -- bread, flour, sugar, rice; using G2 instead of regular Gatorade, etc.); return in one month for follow-up

## 2018-08-02 ENCOUNTER — Other Ambulatory Visit: Payer: Self-pay

## 2018-08-02 ENCOUNTER — Other Ambulatory Visit: Payer: Self-pay | Admitting: Family Medicine

## 2018-08-02 DIAGNOSIS — D508 Other iron deficiency anemias: Secondary | ICD-10-CM

## 2018-08-02 NOTE — Progress Notes (Signed)
Add on ferritin, tibc

## 2018-08-07 ENCOUNTER — Encounter: Payer: Self-pay | Admitting: Emergency Medicine

## 2018-08-07 ENCOUNTER — Ambulatory Visit: Payer: Self-pay

## 2018-08-07 ENCOUNTER — Telehealth: Payer: Self-pay | Admitting: Family Medicine

## 2018-08-07 ENCOUNTER — Emergency Department
Admission: EM | Admit: 2018-08-07 | Discharge: 2018-08-07 | Disposition: A | Discharge status: Home / Self Care | Payer: Medicare Other | Attending: Emergency Medicine | Admitting: Emergency Medicine

## 2018-08-07 ENCOUNTER — Ambulatory Visit: Payer: Medicare Other | Admitting: Dietician

## 2018-08-07 ENCOUNTER — Other Ambulatory Visit: Payer: Self-pay

## 2018-08-07 ENCOUNTER — Emergency Department: Payer: Medicare Other

## 2018-08-07 DIAGNOSIS — Z79899 Other long term (current) drug therapy: Secondary | ICD-10-CM | POA: Insufficient documentation

## 2018-08-07 DIAGNOSIS — Z853 Personal history of malignant neoplasm of breast: Secondary | ICD-10-CM | POA: Diagnosis not present

## 2018-08-07 DIAGNOSIS — I129 Hypertensive chronic kidney disease with stage 1 through stage 4 chronic kidney disease, or unspecified chronic kidney disease: Secondary | ICD-10-CM | POA: Insufficient documentation

## 2018-08-07 DIAGNOSIS — E860 Dehydration: Secondary | ICD-10-CM | POA: Diagnosis not present

## 2018-08-07 DIAGNOSIS — E1122 Type 2 diabetes mellitus with diabetic chronic kidney disease: Secondary | ICD-10-CM | POA: Diagnosis not present

## 2018-08-07 DIAGNOSIS — N183 Chronic kidney disease, stage 3 (moderate): Secondary | ICD-10-CM | POA: Diagnosis not present

## 2018-08-07 DIAGNOSIS — R001 Bradycardia, unspecified: Secondary | ICD-10-CM | POA: Diagnosis not present

## 2018-08-07 DIAGNOSIS — Z7982 Long term (current) use of aspirin: Secondary | ICD-10-CM | POA: Insufficient documentation

## 2018-08-07 DIAGNOSIS — Z85038 Personal history of other malignant neoplasm of large intestine: Secondary | ICD-10-CM | POA: Diagnosis not present

## 2018-08-07 DIAGNOSIS — R0602 Shortness of breath: Secondary | ICD-10-CM | POA: Diagnosis not present

## 2018-08-07 LAB — CBC
HCT: 39.5 % (ref 36.0–46.0)
Hemoglobin: 12.2 g/dL (ref 12.0–15.0)
MCH: 26 pg (ref 26.0–34.0)
MCHC: 30.9 g/dL (ref 30.0–36.0)
MCV: 84.2 fL (ref 80.0–100.0)
Platelets: 279 10*3/uL (ref 150–400)
RBC: 4.69 MIL/uL (ref 3.87–5.11)
RDW: 13.7 % (ref 11.5–15.5)
WBC: 10.1 10*3/uL (ref 4.0–10.5)
nRBC: 0.5 % — ABNORMAL HIGH (ref 0.0–0.2)

## 2018-08-07 LAB — TROPONIN I
Troponin I: <0.03 ng/mL (ref <0.03)
Troponin I: <0.03 ng/mL (ref <0.03)

## 2018-08-07 LAB — COMPREHENSIVE METABOLIC PANEL
ALT: 18 U/L (ref 0–44)
AST: 29 U/L (ref 15–41)
Albumin: 3.6 g/dL (ref 3.5–5.0)
Alkaline Phosphatase: 54 U/L (ref 38–126)
Anion gap: 10 (ref 5–15)
BUN: 25 mg/dL — ABNORMAL HIGH (ref 8–23)
CO2: 27 mmol/L (ref 22–32)
Calcium: 9.9 mg/dL (ref 8.9–10.3)
Chloride: 98 mmol/L (ref 98–111)
Creatinine, Ser: 1.27 mg/dL — ABNORMAL HIGH (ref 0.44–1.00)
GFR calc Af Amer: 45 mL/min — ABNORMAL LOW (ref >60)
GFR calc non Af Amer: 39 mL/min — ABNORMAL LOW (ref >60)
Glucose, Bld: 144 mg/dL — ABNORMAL HIGH (ref 70–99)
Potassium: 4.7 mmol/L (ref 3.5–5.1)
Sodium: 135 mmol/L (ref 135–145)
Total Bilirubin: 0.6 mg/dL (ref 0.3–1.2)
Total Protein: 7.5 g/dL (ref 6.5–8.1)

## 2018-08-07 MED ORDER — SODIUM CHLORIDE 0.9 % IV BOLUS
1000.0000 mL | Freq: Once | INTRAVENOUS | Status: AC
  Administered 2018-08-07: 1000 mL via INTRAVENOUS

## 2018-08-07 MED ORDER — SODIUM CHLORIDE 0.9% FLUSH: 3.0000 mL | Freq: Once | INTRAVENOUS | Status: DC

## 2018-08-07 NOTE — Telephone Encounter (Signed)
I advise her to to the ER now Metformin should not cause those symptoms and certainly should not affect her heart ER now

## 2018-08-07 NOTE — Telephone Encounter (Signed)
Pt. And her daughter report that 2 days after starting Metformin, she started having fatigue, dizziness. Also feels "like my heart is fluttering or beating slower." Unable to check pulse. Denies any chest pain. Feels this is related to the Metformin.No other symptoms.Does not check blood sugars at home.Please advise pt.  Answer Assessment - Initial Assessment Questions 1. DESCRIPTION: "Describe how you are feeling."     Tired, dizzy 2. SEVERITY: "How bad is it?"  "Can you stand and walk?"   - MILD - Feels weak or tired, but does not interfere with work, school or normal activities   - [place] to stand and walk; weakness interferes with work, school, or normal activities   - SEVERE - Unable to stand or walk     Moderate 3. ONSET:  "When did the weakness begin?"     2 DAYS after starting the Metformin 4. CAUSE: "What do you think is causing the weakness?"     The new medicine 5. MEDICINES: "Have you recently started a new medicine or had a change in the amount of a medicine?"     Metformin 6. OTHER SYMPTOMS: "Do you have any other symptoms?" (e.g., chest pain, fever, cough, SOB, vomiting, diarrhea, bleeding, other areas of pain)     Heart feels like "it's beating slower." 7. PREGNANCY: "Is there any chance you are pregnant?" "When was your last menstrual period?"     n/a  Protocols used: WEAKNESS (GENERALIZED) AND FATIGUE-A-AH

## 2018-08-07 NOTE — ED Notes (Signed)
Esign not working at this time. Pt verbalized discharge instructions and has no questions at this time. 

## 2018-08-07 NOTE — Telephone Encounter (Signed)
I reviewed ER note, labs STOP metformin I called home/cell number, left detailed message to STOP metformin, hydrate Will call them tomorrow

## 2018-08-07 NOTE — ED Provider Notes (Signed)
[facility] Emergency Department Provider Note  ____________________________________________  Time seen: Approximately 5:24 PM  I have reviewed the triage vital signs and the nursing notes.   HISTORY  Chief Complaint Shortness of Breath   HPI Veronica Colon is a 85 y.o. female with a history of recently diagnosed diabetes on metformin, remote breast cancer, temporal arteritis on prednisone, hypertension who presents for evaluation of weakness.  Patient reports that she woke up this morning her usual state of health.  She went to the pharmacy.  When she walked back to the car she had a sensation of generalized weakness and felt like her heart was beating slower than normal.  She sat on the car and rested for several minutes.  She drank some water and felt better.  She went home.  After sitting on the couch for several minutes patient felt like she had return to normal.  She mentioned what she felt to her daughter.  Because she was recently started on metformin 6 days ago, she was instructed by her physician to contact her if patient felt any abnormal symptoms.  They called the doctor's office and explained what she had felt.  The doctor said that those symptoms were not associated metformin and recommended that she came to the emergency room for evaluation.  Patient denies any symptoms at this time.  She denies ever having any shortness of breath or chest pain.  She reports that she felt just very weak.  She denies feeling dizzy or having a syncope or near syncopal event.  She denies recent illness, URI symptoms, dysuria, vomiting or diarrhea.  Past Medical History:  Diagnosis Date  . Allergy   . Breast cancer ([place]) 1990   LT MASTECTOMY  . Cancer ([place]) 1990   LT BREAST MASTECTOMY  . GERD (gastroesophageal reflux disease)   . Hypertension   . Personal history of chemotherapy   . Status post chemotherapy    breast cancer  . Temporal arteritis ([place]) 10/28/2016   Overview:  Right temporal arteritis  Biopsy proven 11/08/2016    Patient Active Problem List   Diagnosis Date Noted  . Type 2 diabetes mellitus with obesity ([place]) 08/01/2018  . Hx of iron deficiency anemia 07/26/2018  . Advanced care planning/counseling discussion 07/15/2017  . Back pain 07/15/2017  . Pedal edema 03/01/2017  . Vertigo 03/01/2017  . Medication side effects 03/01/2017  . Temporal arteritis ([place]) 10/28/2016  . Cervical pain 10/06/2016  . Anxiety and depression 04/20/2016  . Allergic rhinitis 02/26/2015  . H/O malignant neoplasm of breast 02/26/2015  . H/O malignant neoplasm of colon 02/26/2015  . GERD (gastroesophageal reflux disease) 02/26/2015  . Hypertensive CKD (chronic kidney disease) 02/26/2015  . CKD (chronic kidney disease), stage III ([place]) 02/26/2015    Past Surgical History:  Procedure Laterality Date  . ABDOMINAL HYSTERECTOMY    . BREAST SURGERY    . COLONOSCOPY WITH PROPOFOL N/A 06/04/2015   Procedure: COLONOSCOPY WITH PROPOFOL;  Surgeon: Manya Silvas, MD;  Location: [facility] ENDOSCOPY;  Service: Endoscopy;  Laterality: N/A;  . MASTECTOMY Left 1990   BREAST CA  . TEMPORAL ARTERY BIOPSY / LIGATION  11/2016    Prior to Admission medications   Medication Sig Start Date End Date Taking? Authorizing Provider  aspirin 81 MG tablet Take 81 mg by mouth daily.    [provider]  fluticasone (FLONASE) 50 MCG/ACT nasal spray Place 2 sprays into both nostrils daily. 08/01/18   Arnetha Courser, MD  metFORMIN (GLUCOPHAGE XR) 500 MG 24 hr tablet Take 1 tablet (500 mg total) by mouth daily with breakfast. 08/01/18   Lada, Satira Anis, MD  Multiple Vitamin (MULTIVITAMIN) tablet Take 1 tablet by mouth daily.    [provider]  OS-CAL CALCIUM + D3 500-200 MG-UNIT TABS Take 1 tablet by mouth 2 (two) times daily with a meal. 10/25/17   [provider]  predniSONE (DELTASONE) 1 MG tablet TAKE 3 TABLETS BY MOUTH ONCE DAILY TAKE WITH 5 MG TABLET (FOR  A TOTAL OF 8 MG OF PREDNISONE A DAY). 01/05/18   [provider]  predniSONE (DELTASONE) 5 MG tablet Take 5 mg by mouth daily with breakfast.    [provider]  spironolactone (ALDACTONE) 50 MG tablet Take 1 tablet (50 mg total) by mouth daily. 08/01/18   Arnetha Courser, MD    Allergies Hydrochlorothiazide and Lisinopril  Family History  Problem Relation Age of Onset  . Breast cancer Mother 97  . Cancer Mother        breast  . Cancer Father        lung    Social History Social History   Tobacco Use  . Smoking status: Never Smoker  . Smokeless tobacco: Never Used  Substance Use Topics  . Alcohol use: No    Alcohol/week: 0.0 standard drinks  . Drug use: No    Review of Systems  Constitutional: Negative for fever. + generalized weakness Eyes: Negative for visual changes. ENT: Negative for sore throat. Neck: No neck pain  Cardiovascular: Negative for chest pain. Respiratory: Negative for shortness of breath. Gastrointestinal: Negative for abdominal pain, vomiting or diarrhea. Genitourinary: Negative for dysuria. Musculoskeletal: Negative for back pain. Skin: Negative for rash. Neurological: Negative for headaches, weakness or numbness. Psych: No SI or HI  ____________________________________________   PHYSICAL EXAM:  VITAL SIGNS: ED Triage Vitals  Enc Vitals Group     BP 08/07/18 1349 138/63     Pulse Rate 08/07/18 1349 64     Resp 08/07/18 1349 20     Temp 08/07/18 1349 98.9 F (37.2 C)     Temp Source 08/07/18 1349 Oral     SpO2 08/07/18 1349 98 %     Weight 08/07/18 1350 197 lb (89.4 kg)     Height 08/07/18 1350 5\' 4"  (1.626 m)     Head Circumference --      Peak Flow --      Pain Score 08/07/18 1350 0     Pain Loc --      Pain Edu? --      Excl. in [place]? --     Constitutional: Alert and oriented. Well appearing and in no apparent distress. HEENT:      Head: Normocephalic and atraumatic.         Eyes: Conjunctivae are normal.  Sclera is non-icteric.       Mouth/Throat: Mucous membranes are moist.       Neck: Supple with no signs of meningismus. Cardiovascular: Regular rate and rhythm. No murmurs, gallops, or rubs. 2+ symmetrical distal pulses are present in all extremities. No JVD. Respiratory: Normal respiratory effort. Lungs are clear to auscultation bilaterally. No wheezes, crackles, or rhonchi.  Gastrointestinal: Soft, non tender, and non distended with positive bowel sounds. No rebound or guarding. Musculoskeletal: Nontender with normal range of motion in all extremities. No edema, cyanosis, or erythema of extremities. Neurologic: Normal speech and language. Face is symmetric. Moving all extremities. No gross focal neurologic deficits  are appreciated. Skin: Skin is warm, dry and intact. No rash noted. Psychiatric: Mood and affect are normal. Speech and behavior are normal.  ____________________________________________   LABS (all labs ordered are listed, but only abnormal results are displayed)  Labs Reviewed  CBC - Abnormal; Notable for the following components:      Result Value   nRBC 0.5 (*)    All other components within normal limits  COMPREHENSIVE METABOLIC PANEL - Abnormal; Notable for the following components:   Glucose, Bld 144 (*)    BUN 25 (*)    Creatinine, Ser 1.27 (*)    GFR calc non Af Amer 39 (*)    GFR calc Af Amer 45 (*)    All other components within normal limits  TROPONIN I  TROPONIN I   ____________________________________________  EKG  ED ECG REPORT I, Rudene Re, the attending physician, personally viewed and interpreted this ECG.  Sinus bradycardia, rate of 59, normal intervals, normal axis, no ST elevations or depressions.  Otherwise normal EKG.  Unchanged from prior. ____________________________________________  RADIOLOGY  I have personally reviewed the images performed during this visit and I agree with the Radiologist's read.   Interpretation by  Radiologist:  Dg Chest 2 View  Result Date: 08/07/2018 CLINICAL DATA:  Intermittent shortness of breath over the last several days. Hypertension. EXAM: CHEST - 2 VIEW COMPARISON:  None. FINDINGS: Heart size is normal. Tortuous aorta. Vascularity is normal. The lungs are clear. No effusions. Previous left mastectomy. IMPRESSION: No active disease.  Tortuous aorta.  Left mastectomy. Electronically Signed   By: Nelson Chimes M.D.   On: 08/07/2018 14:36     ____________________________________________   PROCEDURES  Procedure(s) performed: None Procedures Critical Care performed:  None ____________________________________________   INITIAL IMPRESSION / ASSESSMENT AND PLAN / ED COURSE  85 y.o. female with a history of recently diagnosed diabetes on metformin, remote breast cancer, temporal arteritis on prednisone, hypertension who presents for evaluation of a brief episode of generalized weakness and sensation that her heart was beating too slow.  Patient is asymptomatic at this time.  Stable vital signs.  EKG with no evidence of dysrhythmias or ischemia. Troponin negative. Labs showing mild dehydration for which she was given IV fluids.  No anemia or other electrolyte abnormalities.  Chest x-ray no evidence of pulmonary edema or infiltrate.    _________________________ 6:17 PM on 08/07/2018 -----------------------------------------  Patient remains well-appearing with no further episodes of generalized weakness.  Second troponin is negative.  Has received a liter of fluid for mildly elevated creatinine.  Will discharge home with increase oral hydration and follow-up with primary care doctor.  Discussed standard return precautions.   As part of my medical decision making, I reviewed the following data within the [facility] notes reviewed and incorporated, Labs reviewed , EKG interpreted , Old EKG reviewed, Old chart reviewed, Radiograph reviewed , Notes from prior ED  visits and [place] Controlled Substance Database    Pertinent labs & imaging results that were available during my care of the patient were reviewed by me and considered in my medical decision making (see chart for details).    ____________________________________________   FINAL CLINICAL IMPRESSION(S) / ED DIAGNOSES  Final diagnoses:  Dehydration      NEW MEDICATIONS STARTED DURING THIS VISIT:  ED Discharge Orders    None       Note:  This document was prepared using Dragon voice recognition software and may include unintentional dictation errors.  Alfred Levins, [place], MD 08/07/18 [phone]

## 2018-08-07 NOTE — Telephone Encounter (Signed)
Pt.notified

## 2018-08-07 NOTE — ED Triage Notes (Signed)
Intermittent SOB x past 6 days. Started metformin 6 days ago.

## 2018-08-08 ENCOUNTER — Telehealth: Payer: Self-pay | Admitting: Family Medicine

## 2018-08-08 DIAGNOSIS — N289 Disorder of kidney and ureter, unspecified: Secondary | ICD-10-CM

## 2018-08-08 DIAGNOSIS — R718 Other abnormality of red blood cells: Secondary | ICD-10-CM

## 2018-08-08 NOTE — Telephone Encounter (Signed)
Review ER labs Order f/u labs

## 2018-08-08 NOTE — Telephone Encounter (Signed)
I spoke with her daughter; patient couldn't hear that well Her heart was beating slower, felt so tired Doctor at ER said it doesn't usually slow the heart rate Some dehydration; they gave her IV fluids and she feels much better She has STOPPED the metformin Let's just do nutrition appointment for now and see her back in 3 months I'll look into the nRBC and call them back; not mentioned to them she says; I may just have her come in for labs to check cbc again and bmp ------------------------------------------------ [facility] staff:  Cancel appt for Feb Scheduled for April 3 month f/u

## 2018-08-09 DIAGNOSIS — R718 Other abnormality of red blood cells: Secondary | ICD-10-CM

## 2018-08-09 NOTE — Telephone Encounter (Signed)
Spoke with Veronica Colon daughter, informed her that the Feb appt has been cancelled and we did schedule an appt for 4.3.2020

## 2018-08-09 NOTE — Telephone Encounter (Signed)
Please ask patient to go to the hospital for labs on Thursday January 30th I'd like to recheck the abnormal red blood cells that showed up when she was in the ER Thank you  Please also check on the iron, TIBC, and ferritin that you added on on January 22nd; I don't have those results back yet If they haven't been done, she needs them done at the hospital  Here is what I need to see please on Thursday Jan 30th at the hospital: CBC with diff, platelets Pathology smear, peripheral review Iron, TIBC, ferritin (order to be done at the hospital unless [facility] has results available from last week) Reticulocyte count BMP with GFR  Diagnoses: abnormal RBCs, renal insufficiency

## 2018-08-09 NOTE — Assessment & Plan Note (Signed)
Check labs, refer to hematologist if abnormal

## 2018-08-10 ENCOUNTER — Other Ambulatory Visit: Payer: Self-pay

## 2018-08-10 ENCOUNTER — Other Ambulatory Visit
Admission: RE | Admit: 2018-08-10 | Discharge: 2018-08-10 | Disposition: A | Discharge status: Home / Self Care | Payer: Medicare Other | Source: Ambulatory Visit | Attending: Family Medicine | Admitting: Family Medicine

## 2018-08-10 DIAGNOSIS — R718 Other abnormality of red blood cells: Secondary | ICD-10-CM | POA: Insufficient documentation

## 2018-08-10 DIAGNOSIS — M316 Other giant cell arteritis: Secondary | ICD-10-CM | POA: Diagnosis not present

## 2018-08-10 DIAGNOSIS — R7989 Other specified abnormal findings of blood chemistry: Secondary | ICD-10-CM | POA: Diagnosis not present

## 2018-08-10 DIAGNOSIS — Z79899 Other long term (current) drug therapy: Secondary | ICD-10-CM | POA: Diagnosis not present

## 2018-08-10 DIAGNOSIS — N289 Disorder of kidney and ureter, unspecified: Secondary | ICD-10-CM | POA: Insufficient documentation

## 2018-08-10 LAB — CBC WITH DIFFERENTIAL/PLATELET
Abs Immature Granulocytes: 0.07 10*3/uL (ref 0.00–0.07)
Basophils Absolute: 0 10*3/uL (ref 0.0–0.1)
Basophils Relative: 0 %
Eosinophils Absolute: 0 10*3/uL (ref 0.0–0.5)
Eosinophils Relative: 0 %
HCT: 39.8 % (ref 36.0–46.0)
Hemoglobin: 12 g/dL (ref 12.0–15.0)
Immature Granulocytes: 1 %
Lymphocytes Relative: 12 %
Lymphs Abs: 1.1 10*3/uL (ref 0.7–4.0)
MCH: 25.6 pg — ABNORMAL LOW (ref 26.0–34.0)
MCHC: 30.2 g/dL (ref 30.0–36.0)
MCV: 84.9 fL (ref 80.0–100.0)
Monocytes Absolute: 0.7 10*3/uL (ref 0.1–1.0)
Monocytes Relative: 7 %
Neutro Abs: 7.3 10*3/uL (ref 1.7–7.7)
Neutrophils Relative %: 80 %
Platelets: 283 10*3/uL (ref 150–400)
RBC: 4.69 MIL/uL (ref 3.87–5.11)
RDW: 13.9 % (ref 11.5–15.5)
WBC: 9.1 10*3/uL (ref 4.0–10.5)
nRBC: 0 % (ref 0.0–0.2)

## 2018-08-10 LAB — BASIC METABOLIC PANEL
Anion gap: 7 (ref 5–15)
BUN: 21 mg/dL (ref 8–23)
CO2: 27 mmol/L (ref 22–32)
Calcium: 9.5 mg/dL (ref 8.9–10.3)
Chloride: 102 mmol/L (ref 98–111)
Creatinine, Ser: 1.09 mg/dL — ABNORMAL HIGH (ref 0.44–1.00)
GFR calc Af Amer: 54 mL/min — ABNORMAL LOW (ref >60)
GFR calc non Af Amer: 47 mL/min — ABNORMAL LOW (ref >60)
Glucose, Bld: 119 mg/dL — ABNORMAL HIGH (ref 70–99)
Potassium: 4 mmol/L (ref 3.5–5.1)
Sodium: 136 mmol/L (ref 135–145)

## 2018-08-10 LAB — RETICULOCYTES
Immature Retic Fract: 12.9 % (ref 2.3–15.9)
RBC.: 4.69 MIL/uL (ref 3.87–5.11)
Retic Count, Absolute: 66.1 10*3/uL (ref 19.0–186.0)
Retic Ct Pct: 1.4 % (ref 0.4–3.1)

## 2018-08-10 LAB — PATHOLOGIST SMEAR REVIEW

## 2018-08-10 LAB — IRON AND TIBC
Iron: 25 ug/dL — ABNORMAL LOW (ref 28–170)
Saturation Ratios: 8 % — ABNORMAL LOW (ref 10.4–31.8)
TIBC: 317 ug/dL (ref 250–450)
UIBC: 292 ug/dL

## 2018-08-10 LAB — FERRITIN: Ferritin: 99 ng/mL (ref 11–307)

## 2018-08-10 NOTE — Telephone Encounter (Signed)
Daughter notified, states she will tell her mother

## 2018-08-10 NOTE — Telephone Encounter (Signed)
Left detailed voicemail and labs ordered

## 2018-08-11 ENCOUNTER — Encounter: Payer: Medicare Other | Attending: Family Medicine | Admitting: *Deleted

## 2018-08-11 ENCOUNTER — Encounter: Payer: Self-pay | Admitting: *Deleted

## 2018-08-11 VITALS — BP 134/64 | Ht 64.0 in | Wt 194.6 lb

## 2018-08-11 DIAGNOSIS — E119 Type 2 diabetes mellitus without complications: Secondary | ICD-10-CM | POA: Insufficient documentation

## 2018-08-11 DIAGNOSIS — Z6833 Body mass index (BMI) 33.0-33.9, adult: Secondary | ICD-10-CM | POA: Diagnosis not present

## 2018-08-11 DIAGNOSIS — Z713 Dietary counseling and surveillance: Secondary | ICD-10-CM | POA: Insufficient documentation

## 2018-08-11 NOTE — Progress Notes (Signed)
Diabetes Self-Management Education  Visit Type: First/Initial  Appt. Start Time: 1330 Appt. End Time: 1440  08/11/2018  [name], identified by name and date of birth, is a 85 y.o. female with a diagnosis of Diabetes: Type 2.   ASSESSMENT  Blood pressure 134/64, height 5\' 4"  (1.626 m), weight 194 lb 9.6 oz (88.3 kg). Body mass index is 33.4 kg/m.  Diabetes Self-Management Education - 08/11/18 1541      Visit Information   Visit Type  First/Initial      Initial Visit   Diabetes Type  Type 2    Are you currently following a meal plan?  No   "recently incorporated more fruit, more water, more greens"   Are you taking your medications as prescribed?  Yes    Date Diagnosed  08/01/2018      Health Coping   How would you rate your overall health?  Good      Psychosocial Assessment   Patient Belief/Attitude about Diabetes  Other (comment)   "I don't know"   Self-care barriers  None    Self-management support  Doctor's office;Family    Other persons present  Family Member   daughter   Patient Concerns  Nutrition/Meal planning;Glycemic Control;Weight Control;Healthy Lifestyle    Special Needs  None    Preferred Learning Style  Visual    Learning Readiness  Ready    How often do you need to have someone help you when you read instructions, pamphlets, or other written materials from your doctor or pharmacy?  3 - Sometimes    What is the last grade level you completed in school?  12th      Pre-Education Assessment   Patient understands the diabetes disease and treatment process.  Needs Instruction    Patient understands incorporating nutritional management into lifestyle.  Needs Instruction    Patient undertands incorporating physical activity into lifestyle.  Needs Instruction    Patient understands using medications safely.  Needs Instruction    Patient understands monitoring blood glucose, interpreting and using results  Needs Instruction    Patient understands  prevention, detection, and treatment of acute complications.  Needs Instruction    Patient understands prevention, detection, and treatment of chronic complications.  Needs Instruction    Patient understands how to develop strategies to address psychosocial issues.  Needs Instruction    Patient understands how to develop strategies to promote health/change behavior.  Needs Instruction      Complications   Last HgB A1C per patient/outside source  6.9 %   08/01/2018   How often do you check your blood sugar?  Patient declines   BG in the office was 112 mg/dL at 2:25 pm - 2 hrs pp.    Have you had a dilated eye exam in the past 12 months?  Yes    Have you had a dental exam in the past 12 months?  Yes    Are you checking your feet?  No      Dietary Intake   Breakfast  cereal and almond milk, egg, toast and fruit, 1/2 meat sandwich    Lunch  snack of chips, pork skins, peanuts    Dinner  chicken, beef, pork, fish; sweet potatoes, peas, beans, corn, rice green beans, greens cabbage, salads    Beverage(s)  water, juice, Gatoade zero      Exercise   Exercise Type  Light (walking / raking leaves)    How many days per week to you exercise?  2    How many minutes per day do you exercise?  60    Total minutes per week of exercise  120      Patient Education   Previous Diabetes Education  No    Disease state   Definition of diabetes, type 1 and 2, and the diagnosis of diabetes;Factors that contribute to the development of diabetes    Nutrition management   Role of diet in the treatment of diabetes and the relationship between the three main macronutrients and blood glucose level;Food label reading, portion sizes and measuring food.    Physical activity and exercise   Role of exercise on diabetes management, blood pressure control and cardiac health.    Monitoring  Identified appropriate SMBG and/or A1C goals.    Chronic complications  Relationship between chronic complications and blood glucose  control    Psychosocial adjustment  Identified and addressed patients feelings and concerns about diabetes      Individualized Goals (developed by patient)   Reducing Risk  Improve blood sugars Lose weight Lead a healthier lifestyle     Outcomes   Expected Outcomes  Demonstrated interest in learning. Expect positive outcomes    Future DMSE  2 wks       Individualized Plan for Diabetes Self-Management Training:   Learning Objective:  Patient will have a greater understanding of diabetes self-management. Patient education plan is to attend individual and/or group sessions per assessed needs and concerns.   Plan:   Patient Instructions  Exercise:  Continue exercise video for   60  minutes 2  days a week Eat 3 meals day,   1-2  snacks a day Space meals 4-6 hours apart Include 1 serving of protein with each meal Avoid sugar sweetened drinks (juices) Complete 3 Day Food Record and bring to next appt Return for appointment on:  Monday August 28, 2018 at 9:30 am with [facility] (dietitian)  Expected Outcomes:  Demonstrated interest in learning. Expect positive outcomes  Education material provided:  General Meal Planning Guidelines Simple Meal Plan 3 Day Food Record  If problems or questions, patient to contact team via:  Johny Drilling, RN, CCM, CDE [phone]  Future DSME appointment: 2 wks  August 28, 2018 with the dietitian

## 2018-08-11 NOTE — Patient Instructions (Signed)
Exercise:  Continue exercise video for   60  minutes 2  days a week  Eat 3 meals day,   1-2  snacks a day Space meals 4-6 hours apart Include 1 serving of protein with each meal Avoid sugar sweetened drinks (juices) Complete 3 Day Food Record and bring to next appt  Return for appointment on:  Monday August 28, 2018 at 9:30 am with [facility] (dietitian)

## 2018-08-14 NOTE — Progress Notes (Signed)
Veronica Colon, please let the patient know that her blood smear looked great; no abnormalities Her kidney function had improved since her trip to the ER Her iron level was just a little low; increase intake of iron-rich foods No further work-up needed

## 2018-08-28 ENCOUNTER — Encounter: Payer: Medicare Other | Attending: Family Medicine | Admitting: Dietician

## 2018-08-28 ENCOUNTER — Encounter: Payer: Self-pay | Admitting: Dietician

## 2018-08-28 VITALS — BP 126/74 | Ht 64.0 in | Wt 192.7 lb

## 2018-08-28 DIAGNOSIS — Z6833 Body mass index (BMI) 33.0-33.9, adult: Secondary | ICD-10-CM | POA: Diagnosis not present

## 2018-08-28 DIAGNOSIS — Z713 Dietary counseling and surveillance: Secondary | ICD-10-CM | POA: Diagnosis not present

## 2018-08-28 DIAGNOSIS — E119 Type 2 diabetes mellitus without complications: Secondary | ICD-10-CM | POA: Diagnosis not present

## 2018-08-28 NOTE — Patient Instructions (Signed)
   Great job making healthy food choices and eating balanced meals!  Try almond milk with extra protein, or Fairlife milk, or some extra almonds when you eat cereal, to ensure enough protein.   Can also try a protein drink as part of a meal or snack. Make sure the drink is about 25grams of carb or less. Ideally less than 10 grams of sugar.

## 2018-08-28 NOTE — Progress Notes (Signed)
Diabetes Self-Management Education  Visit Type:  Follow-up  Appt. Start Time: 0930 Appt. End Time: 8676  08/28/2018  Ms. Alexandre Vollmer, identified by name and date of birth, is a 85 y.o. female with a diagnosis of Diabetes: Type 2    ASSESSMENT  Blood pressure 126/74, height 5\' 4"  (1.626 m), weight 192 lb 11.2 oz (87.4 kg). Body mass index is 33.08 kg/m.   Diabetes Self-Management Education - 72/09/47 0962      Complications   How often do you check your blood sugar?  0 times/day (not testing)    Have you had a dilated eye exam in the past 12 months?  Yes    Have you had a dental exam in the past 12 months?  Yes    Are you checking your feet?  Yes    How many days per week are you checking your feet?  1      Dietary Intake   Breakfast  3 meals and 3 snacks daily    Beverage(s)  water, Gatorade zero, less juice      Exercise   Exercise Type  Light (walking / raking leaves)    How many days per week to you exercise?  2    How many minutes per day do you exercise?  60    Total minutes per week of exercise  120      Patient Education   Nutrition management   Role of diet in the treatment of diabetes and the relationship between the three main macronutrients and blood glucose level;Food label reading, portion sizes and measuring food.;Meal options for control of blood glucose level and chronic complications.;Other (comment)   used food diary to practice meal planning   Physical activity and exercise   Role of exercise on diabetes management, blood pressure control and cardiac health.    Monitoring  Interpreting lab values - A1C, lipid, urine microalbumina.;Identified appropriate SMBG and/or A1C goals.    Acute complications  Taught treatment of hypoglycemia - the 15 rule.;Other (comment)   symptoms of hypoglycemia and basic treatment     Post-Education Assessment   Patient understands the diabetes disease and treatment process.  Demonstrates understanding / competency    Patient  understands incorporating nutritional management into lifestyle.  Demonstrates understanding / competency    Patient undertands incorporating physical activity into lifestyle.  Demonstrates understanding / competency    Patient understands using medications safely.  Demonstrates understanding / competency    Patient understands monitoring blood glucose, interpreting and using results  Needs Instruction   patient does not wish to begin BG testing at this time   Patient understands prevention, detection, and treatment of acute complications.  Demonstrates understanding / competency    Patient understands prevention, detection, and treatment of chronic complications.  Demonstrates understanding / competency    Patient understands how to develop strategies to address psychosocial issues.  Needs Review    Patient understands how to develop strategies to promote health/change behavior.  Demonstrates understanding / competency      Outcomes   Program Status  Completed       Learning Objective:  Patient will have a greater understanding of diabetes self-management. Patient education plan is to attend individual and/or group sessions per assessed needs and concerns.  Ms. Talaga is making healthy food choices in general; she has followed recommendations from her previous visit to reduce intake of fruit juice and eat at regular intervals during the day.   Discussed options to ensure adequate  protein intake, and encouraged regular consumption of low-carb vegetables and fruits.    Plan:   Patient Instructions   Doristine Devoid job making healthy food choices and eating balanced meals!  Try almond milk with extra protein, or Fairlife milk, or some extra almonds when you eat cereal, to ensure enough protein.   Can also try a protein drink as part of a meal or snack. Make sure the drink is about 25grams of carb or less. Ideally less than 10 grams of sugar.     Expected Outcomes:  Demonstrated interest in  learning. Expect positive outcomes  Education material provided: Quick and Healthy Meal Ideas; Smart Snacking; hypoglycemia symptoms and treatment (AND)  If problems or questions, patient to contact team via:  Phone  Future DSME appointment: - prn

## 2018-08-30 DIAGNOSIS — R42 Dizziness and giddiness: Secondary | ICD-10-CM | POA: Diagnosis not present

## 2018-08-30 DIAGNOSIS — I10 Essential (primary) hypertension: Secondary | ICD-10-CM | POA: Diagnosis not present

## 2018-08-30 DIAGNOSIS — H903 Sensorineural hearing loss, bilateral: Secondary | ICD-10-CM | POA: Diagnosis not present

## 2018-08-30 DIAGNOSIS — R201 Hypoesthesia of skin: Secondary | ICD-10-CM | POA: Diagnosis not present

## 2018-09-01 ENCOUNTER — Ambulatory Visit: Payer: Medicare Other | Admitting: Family Medicine

## 2018-09-21 DIAGNOSIS — Z79899 Other long term (current) drug therapy: Secondary | ICD-10-CM | POA: Diagnosis not present

## 2018-09-21 DIAGNOSIS — M316 Other giant cell arteritis: Secondary | ICD-10-CM | POA: Diagnosis not present

## 2018-10-02 ENCOUNTER — Other Ambulatory Visit: Payer: Self-pay | Admitting: Internal Medicine

## 2018-10-02 DIAGNOSIS — M316 Other giant cell arteritis: Secondary | ICD-10-CM

## 2018-10-03 ENCOUNTER — Ambulatory Visit: Admission: RE | Admit: 2018-10-03 | Payer: Medicare Other | Source: Ambulatory Visit

## 2018-10-03 ENCOUNTER — Other Ambulatory Visit: Payer: Self-pay

## 2018-10-03 ENCOUNTER — Ambulatory Visit
Admission: RE | Admit: 2018-10-03 | Discharge: 2018-10-03 | Disposition: A | Discharge status: Home / Self Care | Payer: Medicare Other | Source: Ambulatory Visit | Attending: Internal Medicine | Admitting: Internal Medicine

## 2018-10-03 DIAGNOSIS — Z8679 Personal history of other diseases of the circulatory system: Secondary | ICD-10-CM | POA: Diagnosis not present

## 2018-10-03 DIAGNOSIS — M316 Other giant cell arteritis: Secondary | ICD-10-CM | POA: Diagnosis not present

## 2018-10-03 LAB — POCT I-STAT CREATININE: Creatinine, Ser: 1.4 mg/dL — ABNORMAL HIGH (ref 0.44–1.00)

## 2018-10-03 MED ORDER — GADOBUTROL 1 MMOL/ML IV SOLN
8.0000 mL | Freq: Once | INTRAVENOUS | Status: AC | PRN
  Administered 2018-10-03: 8 mL via INTRAVENOUS

## 2018-10-06 DIAGNOSIS — Z79899 Other long term (current) drug therapy: Secondary | ICD-10-CM | POA: Diagnosis not present

## 2018-10-06 DIAGNOSIS — M316 Other giant cell arteritis: Secondary | ICD-10-CM | POA: Diagnosis not present

## 2018-10-13 ENCOUNTER — Encounter: Payer: Self-pay | Admitting: Family Medicine

## 2018-10-13 ENCOUNTER — Ambulatory Visit (INDEPENDENT_AMBULATORY_CARE_PROVIDER_SITE_OTHER): Payer: Medicare Other | Admitting: Family Medicine

## 2018-10-13 DIAGNOSIS — E669 Obesity, unspecified: Secondary | ICD-10-CM

## 2018-10-13 DIAGNOSIS — M316 Other giant cell arteritis: Secondary | ICD-10-CM | POA: Diagnosis not present

## 2018-10-13 DIAGNOSIS — N183 Chronic kidney disease, stage 3 unspecified: Secondary | ICD-10-CM

## 2018-10-13 DIAGNOSIS — E1169 Type 2 diabetes mellitus with other specified complication: Secondary | ICD-10-CM

## 2018-10-13 NOTE — Assessment & Plan Note (Signed)
Explained higher risk of complications from [id number] if she gets it; be extra careful; eye exam when due; not checking FSBS; next A1c due in July

## 2018-10-13 NOTE — Progress Notes (Signed)
LMP  (LMP Unknown)    Subjective:    Patient ID: Veronica Colon, female    DOB: 04/30/1932, 85 y.o.   MRN: [id number]  HPI: Veronica Colon is a 85 y.o. female  Chief Complaint  Patient presents with  . Follow-up    HPI Virtual Visit via Telephone Note   I connected with Ivry Czajkowski on October 13, 2018 at 10:39 am EDT by telephone and verified that I am speaking with the correct person using two identifiers.   Staff discussed the limitations, risks, security, and privacy concerns of performing an evaluation and management service by telephone and the availability of in-person appointments. Staff discussed with the patient that he/she may be responsible for charges related to this service. The patient expressed understanding and agreed to proceed. The patient was asked to check her surroundings, be sure to keep phone off of speaker, let me know immediately if someone comes near and our conversation has to be terminated or paused for privacy concerns.  Additional participants: Connelle, daughter  Patient location: home Provider location: home  Call started: 10:39 am Call terminated: 10:59 am Total length of call: 20 minutes and 31 seconds  Her health is good since last visit She still has dizzy spells at times; just dealing with those; saw the specialist, they couldn't find anything; thinks prednisone  Temporal arteritis; taking prednisone long-term, Dr. Meda Coffee at [place]; no headaches or vision problems; inflammation is going up instead of coming down; waiting on another medicine from the insurance company; last CRP 26, ESR rising  Diabetes mellitus; aware that the prednisone can run her blood sugar up; she did not tolerate last medicine; she feels alright; no dry mouth or blurred vision; no sores on the feet, checking them every night; she says they will call and schedule an eye appt when due; not checking her own sugars; she went to the class, they said her it wasn't necessary; she  asked them at the class and they said it wasn't necessary; she is social distancing  Kidney function was declined on October 03, 2018; had it checked for MRI; no NSAIDs; good water drinker, but they told her at the hospital that she was a little dehydrated a while back; urine is clear; no strong odor  Asked if hearing aide is related to the vertigo; when she puts the hearing aide in, head feels like it is numb: I told her that I honestly don't know; ask Dr. Meda Coffee  Depression screen [facility] 2/9 10/13/2018 08/11/2018 08/01/2018 07/26/2018 01/16/2018  Decreased Interest 0 0 0 0 1  Down, Depressed, Hopeless 0 0 0 0 0  PHQ - 2 Score 0 0 0 0 1  Altered sleeping 0 - 0 0 0  Tired, decreased energy 0 - 0 0 1  Change in appetite 0 - 0 0 0  Feeling bad or failure about yourself  0 - 0 0 0  Trouble concentrating 0 - 0 0 0  Moving slowly or fidgety/restless 0 - 0 0 0  Suicidal thoughts 0 - 0 0 0  PHQ-9 Score 0 - 0 0 2  Difficult doing work/chores Not difficult at all - Not difficult at all Not difficult at all -   Fall Risk  10/13/2018 08/28/2018 08/11/2018 08/01/2018 07/26/2018  Falls in the past year? 0 0 0 0 0  Comment - - - - -  Number falls in past yr: - - - 0 0  Injury with Fall? - - - 0  0    Relevant past medical, surgical, family and social history reviewed Past Medical History:  Diagnosis Date  . Allergy   . Breast cancer ([place]) 1990   LT MASTECTOMY  . Cancer ([place]) 1990   LT BREAST MASTECTOMY  . Diabetes mellitus without complication ([place])   . GERD (gastroesophageal reflux disease)   . Hypertension   . Personal history of chemotherapy   . Status post chemotherapy    breast cancer  . Temporal arteritis ([place]) 10/28/2016   Overview:  Right temporal arteritis  Biopsy proven 11/08/2016   Past Surgical History:  Procedure Laterality Date  . ABDOMINAL HYSTERECTOMY    . BREAST SURGERY    . COLONOSCOPY WITH PROPOFOL N/A 06/04/2015   Procedure: COLONOSCOPY WITH PROPOFOL;  Surgeon: Manya Silvas, MD;   Location: [facility] ENDOSCOPY;  Service: Endoscopy;  Laterality: N/A;  . MASTECTOMY Left 1990   BREAST CA  . TEMPORAL ARTERY BIOPSY / LIGATION  11/2016   Family History  Problem Relation Age of Onset  . Breast cancer Mother 3  . Cancer Mother        breast  . Cancer Father        lung   Social History   Tobacco Use  . Smoking status: Never Smoker  . Smokeless tobacco: Never Used  Substance Use Topics  . Alcohol use: No    Alcohol/week: 0.0 standard drinks  . Drug use: No     Office Visit from 10/13/2018 in [facility]  AUDIT-C Score  0      Interim medical history since last visit reviewed. Allergies and medications reviewed  Review of Systems Per HPI unless specifically indicated above     Objective:    LMP  (LMP Unknown)   Wt Readings from Last 3 Encounters:  08/28/18 192 lb 11.2 oz (87.4 kg)  08/11/18 194 lb 9.6 oz (88.3 kg)  08/07/18 197 lb (89.4 kg)    Physical Exam Pulmonary:     Effort: No respiratory distress.  Neurological:     Mental Status: She is alert.     Cranial Nerves: No dysarthria.  Psychiatric:        Speech: Speech is not rapid and pressured, delayed or slurred.     Results for orders placed or performed during the hospital encounter of 10/03/18  I-STAT creatinine  Result Value Ref Range   Creatinine, Ser 1.40 (H) 0.44 - 1.00 mg/dL      Assessment & Plan:   Problem List Items Addressed This Visit      Cardiovascular and Mediastinum   Temporal arteritis ([place])    Managed by rheum; on prednisone; no red flags to suggest stomach ulcer (no abd pain, blood in stool), call Dr. Meda Coffee right away if any sx        Endocrine   Type 2 diabetes mellitus with obesity ([place])    Explained higher risk of complications from [id number] if she gets it; be extra careful; eye exam when due; not checking FSBS; next A1c due in July        Genitourinary   CKD (chronic kidney disease), stage III [facility])    Nephrologist referral; avoid  NSAIDs; stay hydrated       Other Visit Diagnoses    Chronic kidney disease, stage III (moderate) ([place])    -  Primary   Relevant Orders   Ambulatory referral to Nephrology       Follow up plan: Return in about 4 months (around 01/30/2019)  for follow-up visit with Dr. Sanda Klein.  An after-visit summary was printed and given to the patient at [facility].  Please see the patient instructions which may contain other information and recommendations beyond what is mentioned above in the assessment and plan.  No orders of the defined types were placed in this encounter.   Orders Placed This Encounter  Procedures  . Ambulatory referral to Nephrology

## 2018-10-13 NOTE — Assessment & Plan Note (Signed)
Managed by rheum; on prednisone; no red flags to suggest stomach ulcer (no abd pain, blood in stool), call Dr. Meda Coffee right away if any sx

## 2018-10-13 NOTE — Assessment & Plan Note (Signed)
Nephrologist referral; avoid NSAIDs; stay hydrated

## 2018-10-18 DIAGNOSIS — M316 Other giant cell arteritis: Secondary | ICD-10-CM | POA: Diagnosis not present

## 2018-10-18 DIAGNOSIS — Z79899 Other long term (current) drug therapy: Secondary | ICD-10-CM | POA: Diagnosis not present

## 2018-11-15 DIAGNOSIS — M316 Other giant cell arteritis: Secondary | ICD-10-CM | POA: Diagnosis not present

## 2018-11-15 DIAGNOSIS — Z79899 Other long term (current) drug therapy: Secondary | ICD-10-CM | POA: Diagnosis not present

## 2018-12-11 DIAGNOSIS — G8929 Other chronic pain: Secondary | ICD-10-CM | POA: Diagnosis not present

## 2018-12-11 DIAGNOSIS — Z79899 Other long term (current) drug therapy: Secondary | ICD-10-CM | POA: Diagnosis not present

## 2018-12-11 DIAGNOSIS — M549 Dorsalgia, unspecified: Secondary | ICD-10-CM | POA: Diagnosis not present

## 2018-12-11 DIAGNOSIS — M316 Other giant cell arteritis: Secondary | ICD-10-CM | POA: Diagnosis not present

## 2019-01-08 DIAGNOSIS — M316 Other giant cell arteritis: Secondary | ICD-10-CM | POA: Diagnosis not present

## 2019-01-08 DIAGNOSIS — Z79899 Other long term (current) drug therapy: Secondary | ICD-10-CM | POA: Diagnosis not present

## 2019-01-09 DIAGNOSIS — M5489 Other dorsalgia: Secondary | ICD-10-CM | POA: Diagnosis not present

## 2019-01-09 DIAGNOSIS — G8929 Other chronic pain: Secondary | ICD-10-CM | POA: Diagnosis not present

## 2019-01-10 DIAGNOSIS — M545 Low back pain: Secondary | ICD-10-CM | POA: Diagnosis not present

## 2019-01-25 ENCOUNTER — Other Ambulatory Visit: Payer: Self-pay

## 2019-01-25 ENCOUNTER — Ambulatory Visit (INDEPENDENT_AMBULATORY_CARE_PROVIDER_SITE_OTHER): Payer: Medicare Other

## 2019-01-25 VITALS — BP 142/66 | HR 65 | Ht 64.0 in | Wt 192.0 lb

## 2019-01-25 DIAGNOSIS — Z0000 Encounter for general adult medical examination without abnormal findings: Secondary | ICD-10-CM

## 2019-01-25 NOTE — Patient Instructions (Signed)
Veronica Colon , Thank you for taking time to come for your Medicare Wellness Visit. I appreciate your ongoing commitment to your health goals. Please review the following plan we discussed and let me know if I can assist you in the future.   Screening recommendations/referrals: Colonoscopy: done 06/04/15. No longer required.  Mammogram: done 04/06/17. Please call [phone] to schedule your mammogram.  Bone Density: done 01/2017 Recommended yearly ophthalmology/optometry visit for glaucoma screening and checkup Recommended yearly dental visit for hygiene and checkup  Vaccinations: Influenza vaccine: done 04/11/18 Pneumococcal vaccine: done 04/10/14 Tdap vaccine: done 04/14/15 Shingles vaccine: Shingrix discussed. Please contact your pharmacy for coverage information.   Advanced directives: Advance directive discussed with you today. I have mailed a copy for you to complete at home and have notarized. Once this is complete please bring a copy in to our office so we can scan it into your chart.  Conditions/risks identified: Recommend increasing physical activity as tolerated.   Next appointment: Please follow up in one year for your Medicare Annual Wellness visit.     Preventive Care 83 Years and Older, Female Preventive care refers to lifestyle choices and visits with your health care provider that can promote health and wellness. What does preventive care include?  A yearly physical exam. This is also called an annual well check.  Dental exams once or twice a year.  Routine eye exams. Ask your health care provider how often you should have your eyes checked.  Personal lifestyle choices, including:  Daily care of your teeth and gums.  Regular physical activity.  Eating a healthy diet.  Avoiding tobacco and drug use.  Limiting alcohol use.  Practicing safe sex.  Taking low-dose aspirin every day.  Taking vitamin and mineral supplements as recommended by your health care  provider. What happens during an annual well check? The services and screenings done by your health care provider during your annual well check will depend on your age, overall health, lifestyle risk factors, and family history of disease. Counseling  Your health care provider may ask you questions about your:  Alcohol use.  Tobacco use.  Drug use.  Emotional well-being.  Home and relationship well-being.  Sexual activity.  Eating habits.  History of falls.  Memory and ability to understand (cognition).  Work and work Statistician.  Reproductive health. Screening  You may have the following tests or measurements:  Height, weight, and BMI.  Blood pressure.  Lipid and cholesterol levels. These may be checked every 5 years, or more frequently if you are over 51 years old.  Skin check.  Lung cancer screening. You may have this screening every year starting at age 83 if you have a 30-pack-year history of smoking and currently smoke or have quit within the past 15 years.  Fecal occult blood test (FOBT) of the stool. You may have this test every year starting at age 83.  Flexible sigmoidoscopy or colonoscopy. You may have a sigmoidoscopy every 5 years or a colonoscopy every 10 years starting at age 83.  Hepatitis C blood test.  Hepatitis B blood test.  Sexually transmitted disease (STD) testing.  Diabetes screening. This is done by checking your blood sugar (glucose) after you have not eaten for a while (fasting). You may have this done every 1-3 years.  Bone density scan. This is done to screen for osteoporosis. You may have this done starting at age 83.  Mammogram. This may be done every 1-2 years. Talk to your health care provider  about how often you should have regular mammograms. Talk with your health care provider about your test results, treatment options, and if necessary, the need for more tests. Vaccines  Your health care provider may recommend certain  vaccines, such as:  Influenza vaccine. This is recommended every year.  Tetanus, diphtheria, and acellular pertussis (Tdap, Td) vaccine. You may need a Td booster every 10 years.  Zoster vaccine. You may need this after age 83.  Pneumococcal 13-valent conjugate (PCV13) vaccine. One dose is recommended after age 83.  Pneumococcal polysaccharide (PPSV23) vaccine. One dose is recommended after age 83. Talk to your health care provider about which screenings and vaccines you need and how often you need them. This information is not intended to replace advice given to you by your health care provider. Make sure you discuss any questions you have with your health care provider. Document Released: 07/25/2015 Document Revised: 03/17/2016 Document Reviewed: 04/29/2015 Elsevier Interactive Patient Education  2017 [employer] Prevention in the Home Falls can cause injuries. They can happen to people of all ages. There are many things you can do to make your home safe and to help prevent falls. What can I do on the outside of my home?  Regularly fix the edges of walkways and driveways and fix any cracks.  Remove anything that might make you trip as you walk through a door, such as a raised step or threshold.  Trim any bushes or trees on the path to your home.  Use bright outdoor lighting.  Clear any walking paths of anything that might make someone trip, such as rocks or tools.  Regularly check to see if handrails are loose or broken. Make sure that both sides of any steps have handrails.  Any raised decks and porches should have guardrails on the edges.  Have any leaves, snow, or ice cleared regularly.  Use sand or salt on walking paths during winter.  Clean up any spills in your garage right away. This includes oil or grease spills. What can I do in the bathroom?  Use night lights.  Install grab bars by the toilet and in the tub and shower. Do not use towel bars as grab  bars.  Use non-skid mats or decals in the tub or shower.  If you need to sit down in the shower, use a plastic, non-slip stool.  Keep the floor dry. Clean up any water that spills on the floor as soon as it happens.  Remove soap buildup in the tub or shower regularly.  Attach bath mats securely with double-sided non-slip rug tape.  Do not have throw rugs and other things on the floor that can make you trip. What can I do in the bedroom?  Use night lights.  Make sure that you have a light by your bed that is easy to reach.  Do not use any sheets or blankets that are too big for your bed. They should not hang down onto the floor.  Have a firm chair that has side arms. You can use this for support while you get dressed.  Do not have throw rugs and other things on the floor that can make you trip. What can I do in the kitchen?  Clean up any spills right away.  Avoid walking on wet floors.  Keep items that you use a lot in easy-to-reach places.  If you need to reach something above you, use a strong step stool that has a grab bar.  Keep electrical cords out of the way.  Do not use floor polish or wax that makes floors slippery. If you must use wax, use non-skid floor wax.  Do not have throw rugs and other things on the floor that can make you trip. What can I do with my stairs?  Do not leave any items on the stairs.  Make sure that there are handrails on both sides of the stairs and use them. Fix handrails that are broken or loose. Make sure that handrails are as long as the stairways.  Check any carpeting to make sure that it is firmly attached to the stairs. Fix any carpet that is loose or worn.  Avoid having throw rugs at the top or bottom of the stairs. If you do have throw rugs, attach them to the floor with carpet tape.  Make sure that you have a light switch at the top of the stairs and the bottom of the stairs. If you do not have them, ask someone to add them for  you. What else can I do to help prevent falls?  Wear shoes that:  Do not have high heels.  Have rubber bottoms.  Are comfortable and fit you well.  Are closed at the toe. Do not wear sandals.  If you use a stepladder:  Make sure that it is fully opened. Do not climb a closed stepladder.  Make sure that both sides of the stepladder are locked into place.  Ask someone to hold it for you, if possible.  Clearly mark and make sure that you can see:  Any grab bars or handrails.  First and last steps.  Where the edge of each step is.  Use tools that help you move around (mobility aids) if they are needed. These include:  Canes.  Walkers.  Scooters.  Crutches.  Turn on the lights when you go into a dark area. Replace any light bulbs as soon as they burn out.  Set up your furniture so you have a clear path. Avoid moving your furniture around.  If any of your floors are uneven, fix them.  If there are any pets around you, be aware of where they are.  Review your medicines with your doctor. Some medicines can make you feel dizzy. This can increase your chance of falling. Ask your doctor what other things that you can do to help prevent falls. This information is not intended to replace advice given to you by your health care provider. Make sure you discuss any questions you have with your health care provider. Document Released: 04/24/2009 Document Revised: 12/04/2015 Document Reviewed: 08/02/2014 Elsevier Interactive Patient Education  2017 [employer].

## 2019-01-25 NOTE — Progress Notes (Signed)
Subjective:   Veronica Colon is a 85 y.o. female who presents for Medicare Annual (Subsequent) preventive examination.  Virtual Visit via Telephone Note  I connected with Jessi Pitstick Hornak on 01/25/19 at  8:00 AM EDT by telephone and verified that I am speaking with the correct person using two identifiers.  Medicare Annual Wellness visit completed telephonically due to Covid-19 pandemic.   Location: Patient: home Provider: office   I discussed the limitations, risks, security and privacy concerns of performing an evaluation and management service by telephone and the availability of in person appointments. The patient expressed understanding and agreed to proceed.  Some vital signs may be absent or patient reported.   Clemetine Marker, LPN   Review of Systems:   Cardiac Risk Factors include: diabetes mellitus;hypertension;advanced age (>58men, >36 women);obesity (BMI >30kg/m2)     Objective:     Vitals: BP (!) 142/66   Pulse 65   Ht 5\' 4"  (1.626 m)   Wt 192 lb (87.1 kg)   LMP  (LMP Unknown)   BMI 32.96 kg/m   Body mass index is 32.96 kg/m.  Advanced Directives 01/25/2019 08/11/2018 08/07/2018 07/01/2017 10/16/2016 04/20/2016 08/13/2015  Does Patient Have a Medical Advance Directive? No No No No No Yes Yes  Type of Advance Directive - - - - - Press photographer;Living will [place]  Would patient like information on creating a medical advance directive? Yes (MAU/Ambulatory/Procedural Areas - Information given) Yes (MAU/Ambulatory/Procedural Areas - Information given) - Yes (MAU/Ambulatory/Procedural Areas - Information given) No - Patient declined - -    Tobacco Social History   Tobacco Use  Smoking Status Never Smoker  Smokeless Tobacco Never Used     Counseling given: Not Answered   Clinical Intake:  Pre-visit preparation completed: Yes  Pain : 0-10 Pain Score: 8  Pain Type: Chronic pain Pain Location: Hip Pain Orientation: Left Pain  Descriptors / Indicators: Aching, Discomfort Pain Onset: More than a month ago Pain Frequency: Constant     BMI - recorded: 32.96 Nutritional Status: BMI > 30  Obese Nutritional Risks: None Diabetes: Yes CBG done?: No Did pt. bring in CBG monitor from home?: No  How often do you need to have someone help you when you read instructions, pamphlets, or other written materials from your doctor or pharmacy?: 1 - Never  Interpreter Needed?: No  Information entered by :: Clemetine Marker LPN  Past Medical History:  Diagnosis Date  . Allergy   . Breast cancer ([place]) 1990   LT MASTECTOMY  . Cancer ([place]) 1990   LT BREAST MASTECTOMY  . Diabetes mellitus without complication ([place])   . GERD (gastroesophageal reflux disease)   . Hypertension   . Personal history of chemotherapy   . Status post chemotherapy    breast cancer  . Temporal arteritis ([place]) 10/28/2016   Overview:  Right temporal arteritis  Biopsy proven 11/08/2016   Past Surgical History:  Procedure Laterality Date  . ABDOMINAL HYSTERECTOMY    . BREAST SURGERY    . COLONOSCOPY WITH PROPOFOL N/A 06/04/2015   Procedure: COLONOSCOPY WITH PROPOFOL;  Surgeon: Manya Silvas, MD;  Location: [facility] ENDOSCOPY;  Service: Endoscopy;  Laterality: N/A;  . MASTECTOMY Left 1990   BREAST CA  . TEMPORAL ARTERY BIOPSY / LIGATION  11/2016   Family History  Problem Relation Age of Onset  . Breast cancer Mother 21  . Cancer Mother        breast  . Cancer Father  lung   Social History   Socioeconomic History  . Marital status: Widowed    Spouse name: Not on file  . Number of children: 1  . Years of education: 24  . Highest education level: Not on file  Occupational History  . Occupation: retired  Scientific laboratory technician  . Financial resource strain: Not hard at all  . Food insecurity    Worry: Never true    Inability: Never true  . Transportation needs    Medical: No    Non-medical: No  Tobacco Use  . Smoking status: Never Smoker   . Smokeless tobacco: Never Used  Substance and Sexual Activity  . Alcohol use: No    Alcohol/week: 0.0 standard drinks  . Drug use: No  . Sexual activity: Not Currently  Lifestyle  . Physical activity    Days per week: 0 days    Minutes per session: 0 min  . Stress: Not at all  Relationships  . Social connections    Talks on phone: More than three times a week    Gets together: Twice a week    Attends religious service: More than 4 times per year    Active member of club or organization: Yes    Attends meetings of clubs or organizations: More than 4 times per year    Relationship status: Divorced  Other Topics Concern  . Not on file  Social History Narrative   Pt lives with her daughter    Outpatient Encounter Medications as of 01/25/2019  Medication Sig  . ACTEMRA ACTPEN 162 MG/0.9ML SOAJ   . aspirin 81 MG tablet Take 81 mg by mouth daily.  . fluticasone (FLONASE) 50 MCG/ACT nasal spray Place 2 sprays into both nostrils daily.  . meloxicam (MOBIC) 7.5 MG tablet Take 7.5 mg by mouth daily.  . Multiple Vitamin (MULTIVITAMIN) tablet Take 1 tablet by mouth daily.  Dellia Nims CALCIUM + D3 500-200 MG-UNIT TABS Take 1 tablet by mouth 2 (two) times daily with a meal.  . predniSONE (DELTASONE) 5 MG tablet Take 10 mg by mouth daily with breakfast.   . spironolactone (ALDACTONE) 50 MG tablet Take 1 tablet (50 mg total) by mouth daily.  . predniSONE (DELTASONE) 1 MG tablet TAKE 3 TABLETS BY MOUTH ONCE DAILY TAKE WITH 5 MG TABLET (FOR A TOTAL OF 8 MG OF PREDNISONE A DAY).   No facility-administered encounter medications on file as of 01/25/2019.     Activities of Daily Living In your present state of health, do you have any difficulty performing the following activities: 01/25/2019 10/13/2018  Hearing? Y N  Comment wears hearing aids -  Vision? N N  Comment wears glasses -  Difficulty concentrating or making decisions? N N  Walking or climbing stairs? N N  Dressing or bathing? N N   Doing errands, shopping? N N  Preparing Food and eating ? N -  Using the Toilet? N -  In the past six months, have you accidently leaked urine? N -  Do you have problems with loss of bowel control? N -  Managing your Medications? N -  Managing your Finances? N -  Housekeeping or managing your Housekeeping? N -  Some recent data might be hidden    Patient Care Team: Lada, Satira Anis, MD as PCP - General (Family Medicine) Marlowe Sax, MD as Referring Physician (Internal Medicine) Birder Robson, MD as Referring Physician (Ophthalmology)    Assessment:   This is a routine wellness examination for [place].  Exercise Activities and Dietary recommendations Current Exercise Habits: The patient does not participate in regular exercise at present, Exercise limited by: orthopedic condition(s)  Goals    . DIET - INCREASE WATER INTAKE     recommend drinking at least 6-8 glasses of water a day        Fall Risk Fall Risk  01/25/2019 10/13/2018 08/28/2018 08/11/2018 08/01/2018  Falls in the past year? 0 0 0 0 0  Comment - - - - -  Number falls in past yr: 0 - - - 0  Injury with Fall? 0 - - - 0  Follow up Falls prevention discussed - - - -   FALL RISK PREVENTION PERTAINING TO THE HOME:  Any stairs in or around the home? Yes  If so, do they handrails? Yes   Home free of loose throw rugs in walkways, pet beds, electrical cords, etc? Yes  Adequate lighting in your home to reduce risk of falls? Yes   ASSISTIVE DEVICES UTILIZED TO PREVENT FALLS:  Life alert? No  Use of a cane, walker or w/c? Yes  Grab bars in the bathroom? Yes  Shower chair or bench in shower? Yes  Elevated toilet seat or a handicapped toilet? No   DME ORDERS:  DME order needed?  No   TIMED UP AND GO:  Was the test performed? No . Telephonic visit.   Education: Fall risk prevention has been discussed.  Intervention(s) required? No   Depression Screen PHQ 2/9 Scores 01/25/2019 10/13/2018 08/11/2018  08/01/2018  PHQ - 2 Score 0 0 0 0  PHQ- 9 Score - 0 - 0     Cognitive Function     6CIT Screen 01/25/2019 07/01/2017  What Year? 0 points 0 points  What month? 0 points 0 points  What time? 0 points 0 points  Count back from 20 0 points 0 points  Months in reverse 0 points 0 points  Repeat phrase 2 points 0 points  Total Score 2 0    Immunization History  Administered Date(s) Administered  . Influenza, High Dose Seasonal PF 04/20/2016, 03/29/2017  . Influenza,inj,Quad PF,6+ Mos 04/14/2015  . Influenza-Unspecified 03/29/2012, 04/04/2013, 04/10/2014, 04/14/2015, 04/20/2016  . Pneumococcal Conjugate-13 04/10/2014  . Pneumococcal Polysaccharide-23 03/03/2003  . Td 01/30/2002  . Tdap 04/14/2015  . Zoster 04/10/2014    Qualifies for Shingles Vaccine? Yes  Zostavax completed 2015. Due for Shingrix. Education has been provided regarding the importance of this vaccine. Pt has been advised to call insurance company to determine out of pocket expense. Advised may also receive vaccine at local pharmacy or Health Dept. Verbalized acceptance and understanding.  Tdap: Up to date  Flu Vaccine: Up to date  Pneumococcal Vaccine: Up to date   Screening Tests Health Maintenance  Topic Date Due  . OPHTHALMOLOGY EXAM  08/19/1941  . URINE MICROALBUMIN  08/19/1941  . MAMMOGRAM  04/06/2018  . HEMOGLOBIN A1C  01/30/2019  . INFLUENZA VACCINE  02/10/2019  . FOOT EXAM  08/02/2019  . TETANUS/TDAP  04/13/2025  . DEXA SCAN  Completed  . PNA vac Low Risk Adult  Completed   Cancer Screenings:  Colorectal Screening: Completed 06/04/15.  No longer required.   Mammogram: Completed 04/06/17. Repeat every year; Ordered 07/26/18. Pt provided with contact information and advised to call to schedule appt.   Bone Density: Completed 05/02/14. Results reflect NORMAL. Repeat every 2 years. Pt follow by [facility].   Lung Cancer Screening: (Low Dose CT Chest recommended if Age 56-80 years, 30 pack-year  currently  smoking OR have quit w/in 15years.) does not qualify.    Additional Screening:  Hepatitis C Screening: no longer required  Vision Screening: Recommended annual ophthalmology exams for early detection of glaucoma and other disorders of the eye. Is the patient up to date with their annual eye exam?  Yes  Who is the provider or what is the name of the office in which the pt attends annual eye exams? [place] Screening: Recommended annual dental exams for proper oral hygiene  Community Resource Referral:  CRR required this visit?  No      Plan:    I have personally reviewed and addressed the Medicare Annual Wellness questionnaire and have noted the following in the patient's chart:  A. Medical and social history B. Use of alcohol, tobacco or illicit drugs  C. Current medications and supplements D. Functional ability and status E.  Nutritional status F.  Physical activity G. Advance directives H. List of other physicians I.  Hospitalizations, surgeries, and ER visits in previous 12 months J.  [place] such as hearing and vision if needed, cognitive and depression L. Referrals and appointments   In addition, I have reviewed and discussed with patient certain preventive protocols, quality metrics, and best practice recommendations. A written personalized care plan for preventive services as well as general preventive health recommendations were provided to patient.   Signed,  Clemetine Marker, LPN Nurse Health Advisor   Nurse Notes: pt doing well and appreciative of visit today

## 2019-01-26 ENCOUNTER — Ambulatory Visit: Payer: Medicare Other | Admitting: Family Medicine

## 2019-01-26 ENCOUNTER — Ambulatory Visit: Payer: Medicare Other

## 2019-01-31 DIAGNOSIS — M5416 Radiculopathy, lumbar region: Secondary | ICD-10-CM | POA: Diagnosis not present

## 2019-01-31 DIAGNOSIS — M5136 Other intervertebral disc degeneration, lumbar region: Secondary | ICD-10-CM | POA: Diagnosis not present

## 2019-02-05 ENCOUNTER — Other Ambulatory Visit: Payer: Self-pay | Admitting: Physical Medicine and Rehabilitation

## 2019-02-05 ENCOUNTER — Other Ambulatory Visit (HOSPITAL_COMMUNITY): Payer: Self-pay | Admitting: Physical Medicine and Rehabilitation

## 2019-02-05 DIAGNOSIS — M316 Other giant cell arteritis: Secondary | ICD-10-CM | POA: Diagnosis not present

## 2019-02-05 DIAGNOSIS — Z79899 Other long term (current) drug therapy: Secondary | ICD-10-CM | POA: Diagnosis not present

## 2019-02-05 DIAGNOSIS — M5416 Radiculopathy, lumbar region: Secondary | ICD-10-CM

## 2019-02-07 DIAGNOSIS — Z853 Personal history of malignant neoplasm of breast: Secondary | ICD-10-CM | POA: Diagnosis not present

## 2019-02-07 DIAGNOSIS — N189 Chronic kidney disease, unspecified: Secondary | ICD-10-CM | POA: Diagnosis not present

## 2019-02-07 DIAGNOSIS — I129 Hypertensive chronic kidney disease with stage 1 through stage 4 chronic kidney disease, or unspecified chronic kidney disease: Secondary | ICD-10-CM | POA: Diagnosis not present

## 2019-02-07 DIAGNOSIS — M26609 Unspecified temporomandibular joint disorder, unspecified side: Secondary | ICD-10-CM | POA: Diagnosis not present

## 2019-02-07 DIAGNOSIS — M5416 Radiculopathy, lumbar region: Secondary | ICD-10-CM | POA: Diagnosis not present

## 2019-02-07 DIAGNOSIS — K219 Gastro-esophageal reflux disease without esophagitis: Secondary | ICD-10-CM | POA: Diagnosis not present

## 2019-02-07 DIAGNOSIS — Z9181 History of falling: Secondary | ICD-10-CM | POA: Diagnosis not present

## 2019-02-07 DIAGNOSIS — Z85038 Personal history of other malignant neoplasm of large intestine: Secondary | ICD-10-CM | POA: Diagnosis not present

## 2019-02-16 ENCOUNTER — Other Ambulatory Visit: Payer: Self-pay

## 2019-02-16 ENCOUNTER — Ambulatory Visit
Admission: RE | Admit: 2019-02-16 | Discharge: 2019-02-16 | Disposition: A | Discharge status: Home / Self Care | Payer: Medicare Other | Source: Ambulatory Visit | Attending: Physical Medicine and Rehabilitation | Admitting: Physical Medicine and Rehabilitation

## 2019-02-16 DIAGNOSIS — M5416 Radiculopathy, lumbar region: Secondary | ICD-10-CM | POA: Insufficient documentation

## 2019-02-16 DIAGNOSIS — M545 Low back pain: Secondary | ICD-10-CM | POA: Diagnosis not present

## 2019-02-21 DIAGNOSIS — M5416 Radiculopathy, lumbar region: Secondary | ICD-10-CM | POA: Diagnosis not present

## 2019-02-21 DIAGNOSIS — M5126 Other intervertebral disc displacement, lumbar region: Secondary | ICD-10-CM | POA: Diagnosis not present

## 2019-03-06 DIAGNOSIS — M316 Other giant cell arteritis: Secondary | ICD-10-CM | POA: Diagnosis not present

## 2019-03-06 DIAGNOSIS — Z79899 Other long term (current) drug therapy: Secondary | ICD-10-CM | POA: Diagnosis not present

## 2019-03-12 ENCOUNTER — Ambulatory Visit (INDEPENDENT_AMBULATORY_CARE_PROVIDER_SITE_OTHER): Payer: Medicare Other | Admitting: Nurse Practitioner

## 2019-03-12 ENCOUNTER — Other Ambulatory Visit: Payer: Self-pay

## 2019-03-12 ENCOUNTER — Encounter: Payer: Self-pay | Admitting: Nurse Practitioner

## 2019-03-12 VITALS — BP 134/82 | HR 69 | Temp 96.8°F | Resp 14 | Ht 64.0 in | Wt 195.8 lb

## 2019-03-12 DIAGNOSIS — R6889 Other general symptoms and signs: Secondary | ICD-10-CM

## 2019-03-12 DIAGNOSIS — N183 Chronic kidney disease, stage 3 unspecified: Secondary | ICD-10-CM

## 2019-03-12 DIAGNOSIS — M316 Other giant cell arteritis: Secondary | ICD-10-CM | POA: Diagnosis not present

## 2019-03-12 DIAGNOSIS — M79605 Pain in left leg: Secondary | ICD-10-CM

## 2019-03-12 DIAGNOSIS — I129 Hypertensive chronic kidney disease with stage 1 through stage 4 chronic kidney disease, or unspecified chronic kidney disease: Secondary | ICD-10-CM | POA: Diagnosis not present

## 2019-03-12 DIAGNOSIS — Z862 Personal history of diseases of the blood and blood-forming organs and certain disorders involving the immune mechanism: Secondary | ICD-10-CM | POA: Diagnosis not present

## 2019-03-12 DIAGNOSIS — M79609 Pain in unspecified limb: Secondary | ICD-10-CM

## 2019-03-12 DIAGNOSIS — R202 Paresthesia of skin: Secondary | ICD-10-CM

## 2019-03-12 DIAGNOSIS — E669 Obesity, unspecified: Secondary | ICD-10-CM

## 2019-03-12 DIAGNOSIS — E1169 Type 2 diabetes mellitus with other specified complication: Secondary | ICD-10-CM

## 2019-03-12 MED ORDER — PREGABALIN 25 MG PO CAPS: 25.0000 mg | ORAL_CAPSULE | Freq: Every day | ORAL | 2 refills | Status: DC

## 2019-03-12 NOTE — Assessment & Plan Note (Addendum)
Recheck renal function GFR usually in 50's Last sCr increased from 1.09 to 1.40 Avoiding NSAIDs BP fairly well controlled Pt well hydrated Will see if any decrease in renal function will encourage nephrology, but if still stage 3 and GFR ~ 58 like it has been, will continue to monitor

## 2019-03-12 NOTE — Assessment & Plan Note (Signed)
Normocytic with low iron panel, never treated, pt did not understand nor adapt a diet rich in iron Will recheck labs

## 2019-03-12 NOTE — Assessment & Plan Note (Signed)
Diet controlled, relatively new dx (7 months ago)  Recheck labs

## 2019-03-12 NOTE — Assessment & Plan Note (Signed)
[facility] - steroids daily prednisone 2 mg daily

## 2019-03-12 NOTE — Assessment & Plan Note (Addendum)
BP well controlled today for age  On spironolactone, no SE Recheck electrolytes and renal function today

## 2019-03-12 NOTE — Patient Instructions (Signed)
Recommend following up with Dr. Phyllis Ginger for back and leg pain. You can also try lyrica, just before bedtime to help with pain.  I will call you with your labs.  If your diabetes or kidney function needs close monitoring - will have you come back in 4 months, if things look good we can see you in 6 months.  Return in early October for flu clinic - just call the office to arrange the day (Tuesdays and Thursdays)

## 2019-03-12 NOTE — Progress Notes (Signed)
Name: Veronica Colon   MRN: [id number]    DOB: Apr 03, 1932   Date:03/12/2019       Progress Note  Chief Complaint  Patient presents with  . Follow-up    with labs  . Leg Pain    left with some swelling     Subjective:   Veronica Colon is a 85 y.o. female, presents to clinic for routine follow up on the conditions listed above.  Diabetes Mellitus Type II:  DM type 2, dx'd about 7 months ago diet controlled, not checking sugars.  UTD on eye exam, due in October, Foot exam done earlier this year with PCP, will do labs today.  Pt tried to have healthy diet and exercise is limited by DDD/pain and mobility.  She is on chronic prednisone for the last two years for temporal arteritis.  She is currently weaned down to 2 mg prednisone a day.  She's had a lot of diffuse swelling with it.   Denies: Polyuria, polydipsia, polyphagia, vision changes.  She is having some left leg pain that she wonders if it is from DM.    Recent pertinent labs: Lab Results  Component Value Date   HGBA1C 6.9 (A) 08/01/2018  UTD on DM foot exam and eye exam (Nov 2019 at [facility] - will request records today) ACEI/ARB: not on - is on spironolactone ACEI caused cough Statin: Not on  CKD/HTN: Renal function recent labs: Lab Results  Component Value Date   GFRAA 54 (L) 08/10/2018   GFRAA 45 (L) 08/07/2018   GFRAA 58 (L) 07/26/2018    Lab Results  Component Value Date   CREATININE 1.40 (H) 10/03/2018   BUN 21 08/10/2018   NA 136 08/10/2018   K 4.0 08/10/2018   CL 102 08/10/2018   CO2 27 08/10/2018   Per chart has hx of HTN and CKD stage 3, was referred to nephrology in April this year by her PCP, but pt was unaware of this, the referral is closed and it is noted that pt refused.  From Jan to March 2020 there was a small increase in renal function and labs have not been repeated since.  sCr was 1.09 08/10/2018, then up to 1.40 10/03/2018. Pt is careful to avoid NSAIDS.  BP is treated with spironolactone.   Has allergy to HCTZ and Lisinopril. BP today well controlled.  She is taking meds without SE.  Not monitoring at home.  She denies CP, SOB, palpitations, orthopnea, Has, visual disturbances.   She does have LE edema - ever since taking prednisone- (see below) BP Readings from Last 3 Encounters:  03/12/19 134/82  01/25/19 (!) 142/66  08/28/18 126/74   Left lower extremity pain - pain moderate to severe, wakes her up at night around 3 am She has mild rash to heel/achilies area, she has swelling in both legs.  Tries to avoid salt, no sudden change to legs in swelling or appearance.  She denies pallor, mottling, changes to nails or skin.  She has some pins and needles feeling, she denies numbness.  She is having trouble walking on her left leg, with weight bearing she feels low back pain as well, seems to radiate to left anterior and outer leg and foot.   She does see a specialist for her back pain and is due for f/up and injection with him soon.  She is not taking mobic - was advised to avoid due to her kidney function, only takes tylenol which doesn't help  much.  Hx of anemia and low iron - labs from earlier this year - she is not supplementing with iron, but saw a dietician.  Unclear if she followed her PCP advised to increase food rich in iron.       Patient Active Problem List   Diagnosis Date Noted  . Elevated serum creatinine 08/10/2018  . Abnormal red blood cells 08/09/2018  . Type 2 diabetes mellitus with obesity ([place]) 08/01/2018  . Hx of iron deficiency anemia 07/26/2018  . Back pain 07/15/2017  . Pedal edema 03/01/2017  . Vertigo 03/01/2017  . Medication side effects 03/01/2017  . Osteoporosis screening 10/28/2016  . Temporal arteritis ([place]) 10/28/2016  . Cervical pain 10/06/2016  . Anxiety and depression 04/20/2016  . Allergic rhinitis 02/26/2015  . H/O malignant neoplasm of breast 02/26/2015  . H/O malignant neoplasm of colon 02/26/2015  . GERD (gastroesophageal reflux  disease) 02/26/2015  . Hypertensive CKD (chronic kidney disease) 02/26/2015  . CKD (chronic kidney disease), stage III ([place]) 02/26/2015    Past Surgical History:  Procedure Laterality Date  . ABDOMINAL HYSTERECTOMY    . BREAST SURGERY    . COLONOSCOPY WITH PROPOFOL N/A 06/04/2015   Procedure: COLONOSCOPY WITH PROPOFOL;  Surgeon: Manya Silvas, MD;  Location: [facility] ENDOSCOPY;  Service: Endoscopy;  Laterality: N/A;  . MASTECTOMY Left 1990   BREAST CA  . TEMPORAL ARTERY BIOPSY / LIGATION  11/2016    Family History  Problem Relation Age of Onset  . Breast cancer Mother 25  . Cancer Mother        breast  . Cancer Father        lung    Social History   Socioeconomic History  . Marital status: Widowed    Spouse name: Not on file  . Number of children: 1  . Years of education: 42  . Highest education level: Not on file  Occupational History  . Occupation: retired  Scientific laboratory technician  . Financial resource strain: Not hard at all  . Food insecurity    Worry: Never true    Inability: Never true  . Transportation needs    Medical: No    Non-medical: No  Tobacco Use  . Smoking status: Never Smoker  . Smokeless tobacco: Never Used  Substance and Sexual Activity  . Alcohol use: No    Alcohol/week: 0.0 standard drinks  . Drug use: No  . Sexual activity: Not Currently  Lifestyle  . Physical activity    Days per week: 0 days    Minutes per session: 0 min  . Stress: Not at all  Relationships  . Social connections    Talks on phone: More than three times a week    Gets together: Twice a week    Attends religious service: More than 4 times per year    Active member of club or organization: Yes    Attends meetings of clubs or organizations: More than 4 times per year    Relationship status: Divorced  . Intimate partner violence    Fear of current or ex partner: No    Emotionally abused: No    Physically abused: No    Forced sexual activity: No  Other Topics Concern  . Not  on file  Social History Narrative   Pt lives with her daughter     Current Outpatient Medications:  .  ACTEMRA ACTPEN 162 MG/0.9ML SOAJ, , Disp: , Rfl:  .  aspirin 81 MG tablet, Take 81 mg by  mouth daily., Disp: , Rfl:  .  fluticasone (FLONASE) 50 MCG/ACT nasal spray, Place 2 sprays into both nostrils daily., Disp: 48 g, Rfl: 3 .  Multiple Vitamin (MULTIVITAMIN) tablet, Take 1 tablet by mouth daily., Disp: , Rfl:  .  OS-CAL CALCIUM + D3 500-200 MG-UNIT TABS, Take 1 tablet by mouth 2 (two) times daily with a meal., Disp: , Rfl: 11 .  predniSONE (DELTASONE) 1 MG tablet, TAKE 3 TABLETS BY MOUTH ONCE DAILY TAKE WITH 5 MG TABLET (FOR A TOTAL OF 8 MG OF PREDNISONE A DAY)., Disp: , Rfl: 1 .  spironolactone (ALDACTONE) 50 MG tablet, Take 1 tablet (50 mg total) by mouth daily., Disp: 90 tablet, Rfl: 1 .  meloxicam (MOBIC) 7.5 MG tablet, Take 7.5 mg by mouth daily., Disp: , Rfl:  .  predniSONE (DELTASONE) 5 MG tablet, Take 10 mg by mouth daily with breakfast. , Disp: , Rfl:   Allergies  Allergen Reactions  . Hydrochlorothiazide Other (See Comments)    Hypokalemia   . Lisinopril Other (See Comments) and Cough    Hands numb   . Metformin And Related     Fatigue, "heart was beating slower"    I personally reviewed active problem list, medication list, allergies, family history, social history, health maintenance, notes from last encounter, lab results with the patient/caregiver today.  Review of Systems  Constitutional: Negative.  Negative for activity change and unexpected weight change.  HENT: Negative.   Eyes: Negative.   Respiratory: Negative.   Cardiovascular: Negative.   Gastrointestinal: Negative.  Negative for abdominal pain, constipation, diarrhea, nausea and vomiting.  Endocrine: Negative.  Negative for polydipsia, polyphagia and polyuria.  Genitourinary: Negative.  Negative for decreased urine volume, difficulty urinating, flank pain, frequency, hematuria, pelvic pain and  urgency.  Musculoskeletal: Positive for back pain and gait problem. Negative for arthralgias and myalgias.  Skin: Negative.  Negative for color change, pallor and wound.  Allergic/Immunologic: Negative.   Hematological: Negative.  Negative for adenopathy. Does not bruise/bleed easily.  Psychiatric/Behavioral: Negative.   All other systems reviewed and are negative.    Objective:    Vitals:   03/12/19 0923  BP: 134/82  Pulse: 69  Resp: 14  Temp: (!) 96.8 F (36 C)  SpO2: 98%  Weight: 195 lb 12.8 oz (88.8 kg)  Height: 5\' 4"  (1.626 m)    Body mass index is 33.61 kg/m.  Physical Exam Vitals signs and nursing note reviewed.  Constitutional:      General: She is not in acute distress.    Appearance: Normal appearance. She is well-developed. She is not ill-appearing, toxic-appearing or diaphoretic.  HENT:     Head: Normocephalic and atraumatic.     Right Ear: External ear normal.     Left Ear: External ear normal.     Nose: Nose normal. No congestion or rhinorrhea.     Right Turbinates: Enlarged.     Left Turbinates: Enlarged.     Mouth/Throat:     Mouth: Mucous membranes are moist.     Pharynx: Oropharynx is clear. Uvula midline. No oropharyngeal exudate or posterior oropharyngeal erythema.  Eyes:     General: Lids are normal. No scleral icterus.       Right eye: No discharge.        Left eye: No discharge.     Conjunctiva/sclera: Conjunctivae normal.     Pupils: Pupils are equal, round, and reactive to light.  Neck:     Musculoskeletal: Normal range of motion and  neck supple.     Trachea: Phonation normal. No tracheal deviation.  Cardiovascular:     Rate and Rhythm: Normal rate and regular rhythm.     Pulses: Normal pulses.          Radial pulses are 2+ on the right side and 2+ on the left side.       Posterior tibial pulses are 2+ on the right side and 2+ on the left side.     Heart sounds: Normal heart sounds. No murmur. No friction rub. No gallop.   Pulmonary:      Effort: Pulmonary effort is normal. No respiratory distress.     Breath sounds: Normal breath sounds. No stridor. No decreased breath sounds, wheezing, rhonchi or rales.  Chest:     Chest wall: No tenderness.  Abdominal:     General: Bowel sounds are normal. There is no distension.     Palpations: Abdomen is soft.     Tenderness: There is no abdominal tenderness. There is no right CVA tenderness, left CVA tenderness, guarding or rebound.  Musculoskeletal:        General: No deformity.     Right lower leg: She exhibits no tenderness, no bony tenderness and no swelling. Edema (1+ pitting pretibial edema) present.     Left lower leg: She exhibits no tenderness, no bony tenderness, no swelling and no deformity. Edema (1+ pitting pretibial edema) present.  Lymphadenopathy:     Cervical: No cervical adenopathy.  Skin:    General: Skin is warm and dry.     Capillary Refill: Capillary refill takes less than 2 seconds.     Coloration: Skin is not jaundiced or pale.     Findings: No bruising or rash.  Neurological:     Mental Status: She is alert and oriented to person, place, and time.     Sensory: No sensory deficit.     Motor: No abnormal muscle tone.     Coordination: Coordination normal.     Gait: Gait abnormal.  Psychiatric:        Mood and Affect: Mood normal.        Speech: Speech normal.        Behavior: Behavior normal.      PHQ2/9: Depression screen [facility] 2/9 03/12/2019 01/25/2019 10/13/2018 08/11/2018 08/01/2018  Decreased Interest 0 0 0 0 0  Down, Depressed, Hopeless 0 0 0 0 0  PHQ - 2 Score 0 0 0 0 0  Altered sleeping 0 - 0 - 0  Tired, decreased energy 0 - 0 - 0  Change in appetite 0 - 0 - 0  Feeling bad or failure about yourself  0 - 0 - 0  Trouble concentrating 0 - 0 - 0  Moving slowly or fidgety/restless 0 - 0 - 0  Suicidal thoughts 0 - 0 - 0  PHQ-9 Score 0 - 0 - 0  Difficult doing work/chores Not difficult at all - Not difficult at all - Not difficult at all  Some  recent data might be hidden    phq 9 is negative, reviewed today  Fall Risk: Fall Risk  03/12/2019 01/25/2019 10/13/2018 08/28/2018 08/11/2018  Falls in the past year? 0 0 0 0 0  Comment - - - - -  Number falls in past yr: 0 0 - - -  Injury with Fall? 0 0 - - -  Follow up - Falls prevention discussed - - -    Functional Status Survey: Is the patient deaf or  have difficulty hearing?: Yes Does the patient have difficulty seeing, even when wearing glasses/contacts?: No Does the patient have difficulty concentrating, remembering, or making decisions?: No Does the patient have difficulty walking or climbing stairs?: Yes Does the patient have difficulty dressing or bathing?: No Does the patient have difficulty doing errands alone such as visiting a doctor's office or shopping?: Yes    Assessment & Plan:   Problem List Items Addressed This Visit      Cardiovascular and Mediastinum   Temporal arteritis (Normal)    [facility] - steroids daily prednisone 2 mg daily        Endocrine   Type 2 diabetes mellitus with obesity ([place]) - Primary    Diet controlled, relatively new dx (7 months ago)  Recheck labs      Relevant Orders   Microalbumin, urine   Ambulatory referral to Ophthalmology   COMPLETE METABOLIC PANEL WITH GFR   Lipid panel   Hemoglobin A1c     Genitourinary   Hypertensive CKD (chronic kidney disease)    BP well controlled today for age  On spironolactone, no SE Recheck electrolytes and renal function today      Relevant Orders   COMPLETE METABOLIC PANEL WITH GFR   CKD (chronic kidney disease), stage III ([place])    Recheck renal function GFR usually in 50's Last sCr increased from 1.09 to 1.40 Avoiding NSAIDs BP fairly well controlled Pt well hydrated Will see if any decrease in renal function will encourage nephrology, but if still stage 3 and GFR ~ 58 like it has been, will continue to monitor         Other   Hx of iron deficiency anemia    Normocytic with  low iron panel, never treated, pt did not understand nor adapt a diet rich in iron Will recheck labs      Relevant Orders   CBC with Differential/Platelet   B12 and Folate Panel   Iron, TIBC and Ferritin Panel    Other Visit Diagnoses    Paresthesia and pain of left extremity       outer lower left leg and foot - no neuro deficit, more likely to be related to DDD and sciatica than deficiency, but will check labs   Relevant Orders   B12 and Folate Panel   Iron, TIBC and Ferritin Panel   Other general symptoms and signs       Relevant Orders   B12 and Folate Panel   Iron, TIBC and Ferritin Panel   Left leg pain       possibly sciatica with DDD and low back pain, no concern for DVT, she has symmetrical calf circumference, good pulses, no injury, tx lyrica f/up with [facility]      Encouraged trial of lyrica for leg pain/likely sciatica and f/up with her back doctor.  She will return in October for flu clinic  Health Maintenance  Topic Date Due  . URINE MICROALBUMIN  08/19/1943  . MAMMOGRAM  04/06/2018  . HEMOGLOBIN A1C  01/30/2019  . INFLUENZA VACCINE  04/09/2019 (Originally 02/10/2019)  . OPHTHALMOLOGY EXAM  05/13/2019  . FOOT EXAM  08/02/2019  . TETANUS/TDAP  04/13/2025  . DEXA SCAN  Completed  . PNA vac Low Risk Adult  Completed     Return in about 4 months (around 07/12/2019) for Routine follow-up.    Delsa Grana, PA-C 03/12/19 9:31 AM

## 2019-03-13 ENCOUNTER — Encounter: Payer: Self-pay | Admitting: Family Medicine

## 2019-03-13 ENCOUNTER — Other Ambulatory Visit: Payer: Self-pay | Admitting: Family Medicine

## 2019-03-13 DIAGNOSIS — I129 Hypertensive chronic kidney disease with stage 1 through stage 4 chronic kidney disease, or unspecified chronic kidney disease: Secondary | ICD-10-CM

## 2019-03-13 DIAGNOSIS — N183 Chronic kidney disease, stage 3 unspecified: Secondary | ICD-10-CM

## 2019-03-13 DIAGNOSIS — E7800 Pure hypercholesterolemia, unspecified: Secondary | ICD-10-CM

## 2019-03-13 LAB — COMPLETE METABOLIC PANEL WITH GFR
AG Ratio: 2.2 (calc) (ref 1.0–2.5)
ALT: 17 U/L (ref 6–29)
AST: 23 U/L (ref 10–35)
Albumin: 4.3 g/dL (ref 3.6–5.1)
Alkaline phosphatase (APISO): 35 U/L — ABNORMAL LOW (ref 37–153)
BUN/Creatinine Ratio: 14 (calc) (ref 6–22)
BUN: 17 mg/dL (ref 7–25)
CO2: 28 mmol/L (ref 20–32)
Calcium: 10.4 mg/dL (ref 8.6–10.4)
Chloride: 104 mmol/L (ref 98–110)
Creat: 1.24 mg/dL — ABNORMAL HIGH (ref 0.60–0.88)
GFR, Est African American: 46 mL/min/{1.73_m2} — ABNORMAL LOW (ref >=60)
GFR, Est Non African American: 40 mL/min/{1.73_m2} — ABNORMAL LOW (ref >=60)
Globulin: 2 g/dL (calc) (ref 1.9–3.7)
Glucose, Bld: 121 mg/dL — ABNORMAL HIGH (ref 65–99)
Potassium: 4.2 mmol/L (ref 3.5–5.3)
Sodium: 141 mmol/L (ref 135–146)
Total Bilirubin: 0.9 mg/dL (ref 0.2–1.2)
Total Protein: 6.3 g/dL (ref 6.1–8.1)

## 2019-03-13 LAB — CBC WITH DIFFERENTIAL/PLATELET
Absolute Monocytes: 638 cells/uL (ref 200–950)
Basophils Absolute: 39 cells/uL (ref 0–200)
Basophils Relative: 0.7 %
Eosinophils Absolute: 62 cells/uL (ref 15–500)
Eosinophils Relative: 1.1 %
HCT: 43.3 % (ref 35.0–45.0)
Hemoglobin: 14 g/dL (ref 11.7–15.5)
Lymphs Abs: 1467 cells/uL (ref 850–3900)
MCH: 28.7 pg (ref 27.0–33.0)
MCHC: 32.3 g/dL (ref 32.0–36.0)
MCV: 88.7 fL (ref 80.0–100.0)
MPV: 10.7 fL (ref 7.5–12.5)
Monocytes Relative: 11.4 %
Neutro Abs: 3394 cells/uL (ref 1500–7800)
Neutrophils Relative %: 60.6 %
Platelets: 165 10*3/uL (ref 140–400)
RBC: 4.88 10*6/uL (ref 3.80–5.10)
RDW: 12.1 % (ref 11.0–15.0)
Total Lymphocyte: 26.2 %
WBC: 5.6 10*3/uL (ref 3.8–10.8)

## 2019-03-13 LAB — HEMOGLOBIN A1C
Hgb A1c MFr Bld: 6.2 % of total Hgb — ABNORMAL HIGH (ref <5.7)
Mean Plasma Glucose: 131 (calc)
eAG (mmol/L): 7.3 (calc)

## 2019-03-13 LAB — B12 AND FOLATE PANEL
Folate: >24 ng/mL
Vitamin B-12: 446 pg/mL (ref 200–1100)

## 2019-03-13 LAB — IRON,TIBC AND FERRITIN PANEL
%SAT: 49 % — ABNORMAL HIGH (ref 16–45)
Ferritin: 71 ng/mL (ref 16–288)
Iron: 186 ug/dL — ABNORMAL HIGH (ref 45–160)
TIBC: 382 ug/dL (ref 250–450)

## 2019-03-13 LAB — LIPID PANEL
Cholesterol: 271 mg/dL — ABNORMAL HIGH (ref <200)
HDL: 98 mg/dL (ref >=50)
LDL Cholesterol (Calc): 145 mg/dL (calc) — ABNORMAL HIGH
Non-HDL Cholesterol (Calc): 173 mg/dL (calc) — ABNORMAL HIGH (ref <130)
Total CHOL/HDL Ratio: 2.8 (calc) (ref <5.0)
Triglycerides: 151 mg/dL — ABNORMAL HIGH (ref <150)

## 2019-03-13 LAB — MICROALBUMIN, URINE: Microalb, Ur: 1.1 mg/dL

## 2019-03-13 MED ORDER — PRAVASTATIN SODIUM 20 MG PO TABS: 20.0000 mg | ORAL_TABLET | Freq: Every day | ORAL | 3 refills | Status: DC

## 2019-03-22 DIAGNOSIS — M5416 Radiculopathy, lumbar region: Secondary | ICD-10-CM | POA: Diagnosis not present

## 2019-03-22 DIAGNOSIS — M5126 Other intervertebral disc displacement, lumbar region: Secondary | ICD-10-CM | POA: Diagnosis not present

## 2019-04-11 DIAGNOSIS — M5416 Radiculopathy, lumbar region: Secondary | ICD-10-CM | POA: Diagnosis not present

## 2019-04-11 DIAGNOSIS — M5126 Other intervertebral disc displacement, lumbar region: Secondary | ICD-10-CM | POA: Diagnosis not present

## 2019-04-11 DIAGNOSIS — M5136 Other intervertebral disc degeneration, lumbar region: Secondary | ICD-10-CM | POA: Diagnosis not present

## 2019-05-07 ENCOUNTER — Telehealth: Payer: Self-pay | Admitting: Family Medicine

## 2019-05-07 NOTE — Chronic Care Management (AMB) (Signed)
Chronic Care Management   Note  05/07/2019 Name: Veronica Colon MRN: [id number] DOB: Jul 07, 1932  Veronica Colon is a 85 y.o. year old female who is a primary care patient of Delsa Grana, [place]. I reached out to Veronica Rhea Doby by phone today in response to a referral sent by Ms. Troi Bechtold Fedorko's health plan.     Ms. Morford was given information about Chronic Care Management services today including:  1. CCM service includes personalized support from designated clinical staff supervised by her physician, including individualized plan of care and coordination with other care providers 2. 24/7 contact phone numbers for assistance for urgent and routine care needs. 3. Service will only be billed when office clinical staff spend 20 minutes or more in a month to coordinate care. 4. Only one practitioner may furnish and bill the service in a calendar month. 5. The patient may stop CCM services at any time (effective at the end of the month) by phone call to the office staff. 6. The patient will be responsible for cost sharing (co-pay) of up to 20% of the service fee (after annual deductible is met).  Patient agreed to services and verbal consent obtained.   Follow up plan: Telephone appointment with CCM team member scheduled for: 06/06/2019  [place]  [email or web address]   ??[phone]

## 2019-05-17 ENCOUNTER — Telehealth: Payer: Self-pay | Admitting: Family Medicine

## 2019-05-17 NOTE — Telephone Encounter (Signed)
Pt's daughter called in to ask, medication pravastatin (PRAVACHOL) 20 MG tablet is causing pt to have joint pain, daughter would like to know if there is another medication that could be prescribed?

## 2019-05-18 NOTE — Telephone Encounter (Signed)
appt sch'd

## 2019-05-18 NOTE — Telephone Encounter (Signed)
Need f/up appt to address/document

## 2019-05-23 ENCOUNTER — Ambulatory Visit (INDEPENDENT_AMBULATORY_CARE_PROVIDER_SITE_OTHER): Payer: Medicare Other | Admitting: Family Medicine

## 2019-05-23 ENCOUNTER — Other Ambulatory Visit: Payer: Self-pay

## 2019-05-23 ENCOUNTER — Encounter: Payer: Self-pay | Admitting: Family Medicine

## 2019-05-23 VITALS — BP 125/70 | Ht 64.0 in | Wt 190.0 lb

## 2019-05-23 DIAGNOSIS — M791 Myalgia, unspecified site: Secondary | ICD-10-CM

## 2019-05-23 DIAGNOSIS — M2550 Pain in unspecified joint: Secondary | ICD-10-CM | POA: Diagnosis not present

## 2019-05-23 NOTE — Progress Notes (Signed)
Name: Veronica Colon   MRN: [id number]    DOB: Jan 08, 1932   Date:05/23/2019       Progress Note  Subjective:    Chief Complaint  Chief Complaint  Patient presents with   Allergic Reaction    side effects muscle pain to pravastatin    I connected with  Kayler Peery Graham  on 05/23/19 at  1:00 PM EST by a video enabled telemedicine application and verified that I am speaking with the correct person using two identifiers.  I discussed the limitations of evaluation and management by telemedicine and the availability of in person appointments. The patient expressed understanding and agreed to proceed. Staff also discussed with the patient that there may be a patient responsible charge related to this service. Patient Location: home Provider Location: [facility] clinic Additional Individuals present: Her daughter was present and helped her get on the virtual visit  HPI Pt presents for virtual encounter for CC of suspected allergic reaction to pravastatin.  She complains of mostly joint pain and slight muscle pain, pain is worse to her knees and to her back.  Pain has been daily for the past couple weeks to months, pain is mild to moderate, she is doing therapy and on other medications to manage her muscle skeletal pain, but she had hoped she would gradually improve and she is not.  Pt's last OV was 03/12/2019 with elevated lipids Results were relayed to pt:  Her cholesterol is very high - for her age this would need to be a discussion of how aggressive you would want to be with meds vs working on diet.  The cholesterol medicine help stabilize any cholesterol plaques in blood vessels to prevent heart attacks and strokes and they also decrease cholesterol in blood to prevent atherosclerosis - for age right now, it would be more stabilizing than preventative" and after she was mailed information about cholesterol, cardiovascular risk, and statin medications she did decide to start a statin.  She has been on  pravastatin 20 mg She mostly complains of pain in her joints and also states that she got a letter from her insurance company stating that this medication will cause worsening joint pain.  She has been going to physical therapy, but she has not had any improvement in her joint pain and muscle pain.  She denies any fatigue or jaundice.  Patient Active Problem List   Diagnosis Date Noted   Type 2 diabetes mellitus with obesity ([place]) 08/01/2018   Hx of iron deficiency anemia 07/26/2018   Pedal edema 03/01/2017   Temporal arteritis ([place]) 10/28/2016   Cervical pain 10/06/2016   Anxiety and depression 04/20/2016   Allergic rhinitis 02/26/2015   H/O malignant neoplasm of breast 02/26/2015   H/O malignant neoplasm of colon 02/26/2015   GERD (gastroesophageal reflux disease) 02/26/2015   Hypertensive CKD (chronic kidney disease) 02/26/2015   CKD (chronic kidney disease), stage III 02/26/2015    Social History   Tobacco Use   Smoking status: Never Smoker   Smokeless tobacco: Never Used  Substance Use Topics   Alcohol use: No    Alcohol/week: 0.0 standard drinks     Current Outpatient Medications:    ACTEMRA ACTPEN 162 MG/0.9ML SOAJ, , Disp: , Rfl:    aspirin 81 MG tablet, Take 81 mg by mouth daily., Disp: , Rfl:    fluticasone (FLONASE) 50 MCG/ACT nasal spray, Place 2 sprays into both nostrils daily., Disp: 48 g, Rfl: 3   Multiple Vitamin (MULTIVITAMIN) tablet,  Take 1 tablet by mouth daily., Disp: , Rfl:    OS-CAL CALCIUM + D3 500-200 MG-UNIT TABS, Take 1 tablet by mouth 2 (two) times daily with a meal., Disp: , Rfl: 11   pravastatin (PRAVACHOL) 20 MG tablet, Take 1 tablet (20 mg total) by mouth at bedtime., Disp: 90 tablet, Rfl: 3   pregabalin (LYRICA) 25 MG capsule, Take 1 capsule (25 mg total) by mouth at bedtime., Disp: 30 capsule, Rfl: 2   spironolactone (ALDACTONE) 50 MG tablet, Take 1 tablet (50 mg total) by mouth daily., Disp: 90 tablet, Rfl: 1  Allergies    Allergen Reactions   Hydrochlorothiazide Other (See Comments)    Hypokalemia    Lisinopril Other (See Comments) and Cough    Hands numb    Metformin And Related     Fatigue, "heart was beating slower"    I personally reviewed active problem list, medication list, allergies, family history, social history, notes from last encounter, lab results with the patient/caregiver today.  Review of Systems  Constitutional: Negative.  Negative for activity change, appetite change and unexpected weight change.  HENT: Negative.   Eyes: Negative.   Respiratory: Negative.   Cardiovascular: Negative.   Gastrointestinal: Negative.  Negative for abdominal pain, nausea and vomiting.  Endocrine: Negative.   Genitourinary: Negative.   Musculoskeletal: Negative.   Skin: Negative.  Negative for color change.  Allergic/Immunologic: Negative.   Neurological: Negative.  Negative for dizziness, weakness and headaches.  Hematological: Negative.   Psychiatric/Behavioral: Negative.   All other systems reviewed and are negative.    Objective:   Virtual encounter, vitals limited, only able to obtain the following Today's Vitals   05/23/19 1243  BP: 125/70  Weight: 190 lb (86.2 kg)  Height: 5\' 4"  (1.626 m)   Body mass index is 32.61 kg/m. Nursing Note and Vital Signs reviewed.  Physical Exam Vitals signs and nursing note reviewed.  Constitutional:      General: She is not in acute distress.    Appearance: She is well-developed. She is not ill-appearing, toxic-appearing or diaphoretic.     Comments: Elderly female, appears stated age, well appearing  HENT:     Head: Normocephalic and atraumatic.     Nose: Nose normal.  Eyes:     General:        Right eye: No discharge.        Left eye: No discharge.     Conjunctiva/sclera: Conjunctivae normal.  Neck:     Trachea: No tracheal deviation.  Pulmonary:     Effort: Pulmonary effort is normal. No respiratory distress.     Breath sounds: No  stridor.  Skin:    Coloration: Skin is not jaundiced or pale.     Findings: No erythema or rash.  Neurological:     Mental Status: She is alert.     Motor: No abnormal muscle tone.     Coordination: Coordination normal.     Comments: Pt observed walking and then seated herself at the table, slow gait, normal movement and coordination of bilateral UE  Psychiatric:        Mood and Affect: Mood normal.        Behavior: Behavior normal.        Thought Content: Thought content normal.     PE limited by telephone encounter  No results found for this or any previous visit (from the past 72 hour(s)).  Assessment and Plan:     ICD-10-CM   1. Myalgia  M79.10  CK (Creatine Kinase)    CMP w GFR   check cmp and CK - pt will hold statin for now, discussed SE vs benefit at her age, and she does have option to reduce or d/c statin.  2. Polyarthralgia  M25.50 CK (Creatine Kinase)    CMP w GFR   she believes a medication SE, may also be normal aging/OA    Pt was seen 8/31, after shared discussion patient did decide to start a statin medication.  Since for her age she does not have to be on statin medication I have encouraged her to hold the medication for a week or 2 and see if her symptoms change at all.  She also may opt to only take 1-2 doses a week and see if that improves her symptoms.  I have asked her to come by to check her chemistry and CK.    Paperwork regarding medications and side effects will be left for her for when she comes in to get her labs  - I discussed the assessment and treatment plan with the patient. The patient was provided an opportunity to ask questions and all were answered. The patient agreed with the plan and demonstrated an understanding of the instructions.  I provided 10 minutes of non-face-to-face time during this encounter.  Delsa Grana, PA-C 05/23/19 1:16 PM

## 2019-05-23 NOTE — Patient Instructions (Signed)
Statin Intolerance Statin intolerance is the inability to take a certain type of cholesterol-lowering medicine (statin) because of unwanted side effects, such as muscle pain. Statins may be prescribed to improve cholesterol levels and lower the risk for heart attack, heart disease, and stroke. People with statin intolerance may experience muscle pain and cramps (myalgia) that go away when the statin is stopped. What are the causes? The cause of this condition is not known. What increases the risk? You may be at higher risk for statin intolerance if you:  Take more than one type of cholesterol-lowering medicine at a time.  Need a higher than normal dosage of a statin.  Have a history of high CK (creatine kinase) in the blood. CK is an enzyme that is released when muscle tissue is damaged.  Are a woman.  Have a body size that is smaller than normal.  Are age 51 or older.  Drink a lot of alcohol.  Are of Asian descent.  Have kidney, liver, or muscle disease.  Have a low level of hormones that control how your body uses energy (hypothyroidism).  Take certain medicines, including: ? Certain medicines for mental illness (antipsychotics). ? Some types of antibiotics. ? Certain medicines used for blood pressure or heart disease. ? Medicines that reduce the activity of the body's disease-fighting system (immunosuppressants). ? Medicines to treat hepatitis C and HIV/AIDS (human immunodeficiency virus/acquired immunodeficiency syndrome).  Follow an intense exercise program.  Drink a lot of grapefruit juice.  Have a lack of vitamin D (deficiency). What are the signs or symptoms? Signs and symptoms of statin intolerance include:  General muscle aches (myalgia). This may feel similar to muscle aches that are caused by the flu.  Muscle pain, tenderness, cramps, or weakness (myositis).  Severe muscle pain, weakness, and raised blood CK levels (rhabdomyolysis). Symptoms usually go away  when the statin is stopped. Rarely, liver damage can also occur, which can cause:  Loss of appetite.  Pain in the upper right abdomen.  Yellowing of the skin or the white parts of the eyes (jaundice). How is this diagnosed? This condition is diagnosed based on your symptoms, your medical history, and a physical exam. You may also have blood tests. How is this treated? Your health care provider may have you stop taking the statin for a short time to see if your symptoms go away. Then your provider may restart your statin, or:  Change you to a different statin.  Lower the dosage of your statin.  Have you take your statin less often.  Change you to another type of cholesterol-lowering medicine.  Stop or change any medicines that might be interfering with your statin.  Limit how much grapefruit juice you drink.  Recommend stopping intense exercise. Follow these instructions at home: Medicines  Take your statin medicine as told by your health care provider. Do not stop taking the statin unless your health care provider tells you to stop.  Take other over-the-counter and prescription medicines only as told by your health care provider.  Check with your health care provider before taking any new medicines. Certain medicines can increase your risk for statin intolerance. Lifestyle  Limit alcohol intake to no more than 1 drink a day for nonpregnant women and 2 drinks a day for men. One drink equals 12 oz of beer, 5 oz of wine, or 1 oz of hard liquor.  Do not use any products that contain nicotine or tobacco, such as cigarettes and e-cigarettes. If you need help  quitting, ask your health care provider. General instructions   Have blood tests to check CK levels or liver enzymes as told by your health care provider.  Exercise as directed. Ask your health care provider what exercises are best for you. Do not start a new exercise program before talking about it with your health care  provider.  Follow instructions from your health care provider about eating or drinking restrictions. Your health care provider may recommend: ? Limiting the amount of grapefruit juice you drink, or not drinking it at all. ? Eating a diet that is low in saturated fats and high in fiber.  Maintain a healthy weight with diet and exercise.  Keep all follow-up visits as told by your health care provider. This is important. Contact a health care provider if:  You have any symptoms of statin intolerance. Summary  Statins are important medicines for improving your cholesterol, which may reduce your risk for heart attack and stroke.  Some people are not able to continue taking a particular statin because of muscle problems (myalgia) or other side effects.  Myalgia is the most common symptom of statin intolerance. Often, the muscle pain and cramps from myalgia go away when the statin is stopped.  Although rare, liver damage can occur as a result of statin intolerance. You should have routine blood tests to check your liver enzymes.  In most cases, statin intolerance can be managed and you can continue to take a cholesterol-lowering medicine. This information is not intended to replace advice given to you by your health care provider. Make sure you discuss any questions you have with your health care provider. Document Released: 12/09/2016 Document Revised: 06/10/2017 Document Reviewed: 12/09/2016 Elsevier Patient Education  2020 [employer].

## 2019-05-26 LAB — COMPLETE METABOLIC PANEL WITH GFR
AG Ratio: 2.6 (calc) — ABNORMAL HIGH (ref 1.0–2.5)
ALT: 19 U/L (ref 6–29)
AST: 23 U/L (ref 10–35)
Albumin: 4.1 g/dL (ref 3.6–5.1)
Alkaline phosphatase (APISO): 40 U/L (ref 37–153)
BUN/Creatinine Ratio: 14 (calc) (ref 6–22)
BUN: 14 mg/dL (ref 7–25)
CO2: 27 mmol/L (ref 20–32)
Calcium: 9.8 mg/dL (ref 8.6–10.4)
Chloride: 105 mmol/L (ref 98–110)
Creat: 1.03 mg/dL — ABNORMAL HIGH (ref 0.60–0.88)
GFR, Est African American: 57 mL/min/{1.73_m2} — ABNORMAL LOW (ref >=60)
GFR, Est Non African American: 50 mL/min/{1.73_m2} — ABNORMAL LOW (ref >=60)
Globulin: 1.6 g/dL (calc) — ABNORMAL LOW (ref 1.9–3.7)
Glucose, Bld: 145 mg/dL — ABNORMAL HIGH (ref 65–99)
Potassium: 4 mmol/L (ref 3.5–5.3)
Sodium: 141 mmol/L (ref 135–146)
Total Bilirubin: 0.7 mg/dL (ref 0.2–1.2)
Total Protein: 5.7 g/dL — ABNORMAL LOW (ref 6.1–8.1)

## 2019-05-26 LAB — CK: Total CK: 69 U/L (ref 29–143)

## 2019-06-06 ENCOUNTER — Telehealth: Payer: Medicare Other

## 2019-06-18 ENCOUNTER — Other Ambulatory Visit: Payer: Self-pay | Admitting: Family Medicine

## 2019-06-25 ENCOUNTER — Telehealth: Payer: Self-pay

## 2019-06-25 ENCOUNTER — Other Ambulatory Visit: Payer: Self-pay

## 2019-06-25 ENCOUNTER — Ambulatory Visit: Payer: Self-pay

## 2019-06-25 NOTE — Patient Outreach (Signed)
[facility]) Care Management  06/25/2019  Vinessa Schlaefer Georgi [birth year] [id number]   Medication Adherence call to Mrs. Cionna Mentzer [facility] Identifiers Verify spoke with patients daughter,patient is past due on Pravastatin 20 mg,daughter explain patient is no longer taking this medication she was having side effects doctor took her off, Mrs. Cooler is showing past due under [place].   [facility] Management Direct Dial [phone]  Fax [phone] [email or web address]

## 2019-06-25 NOTE — Chronic Care Management (AMB) (Signed)
  Chronic Care Management   Outreach Note  06/25/2019 Name: AVERLY MOGEL MRN: [id number] DOB: 1931-09-19  Primary Care Provider: Delsa Grana, PA-C Reason for referral : Chronic Care Management   A second unsuccessful telephone outreach was attempted today. Ms. Beda was referred to the case management team for assistance with care management and care coordination.    Follow Up Plan: Unable to leave a message via telephone. I will mail an unsuccessful outreach letter. The care management team will reach out again within the next two to three weeks.   [facility] Care Management [phone]

## 2019-07-12 ENCOUNTER — Other Ambulatory Visit: Payer: Self-pay

## 2019-07-12 ENCOUNTER — Encounter: Payer: Self-pay | Admitting: Family Medicine

## 2019-07-12 ENCOUNTER — Ambulatory Visit (INDEPENDENT_AMBULATORY_CARE_PROVIDER_SITE_OTHER): Payer: Medicare Other | Admitting: Family Medicine

## 2019-07-12 VITALS — BP 136/62 | HR 69 | Temp 97.8°F | Resp 14 | Ht 64.0 in | Wt 193.4 lb

## 2019-07-12 DIAGNOSIS — E611 Iron deficiency: Secondary | ICD-10-CM

## 2019-07-12 DIAGNOSIS — N1831 Chronic kidney disease, stage 3a: Secondary | ICD-10-CM

## 2019-07-12 DIAGNOSIS — E7800 Pure hypercholesterolemia, unspecified: Secondary | ICD-10-CM | POA: Diagnosis not present

## 2019-07-12 DIAGNOSIS — I129 Hypertensive chronic kidney disease with stage 1 through stage 4 chronic kidney disease, or unspecified chronic kidney disease: Secondary | ICD-10-CM | POA: Diagnosis not present

## 2019-07-12 DIAGNOSIS — E669 Obesity, unspecified: Secondary | ICD-10-CM

## 2019-07-12 DIAGNOSIS — N183 Chronic kidney disease, stage 3 unspecified: Secondary | ICD-10-CM

## 2019-07-12 DIAGNOSIS — E1169 Type 2 diabetes mellitus with other specified complication: Secondary | ICD-10-CM

## 2019-07-12 MED ORDER — PREGABALIN 25 MG PO CAPS: 25.0000 mg | ORAL_CAPSULE | Freq: Two times a day (BID) | ORAL | 2 refills | Status: DC

## 2019-07-12 NOTE — Progress Notes (Signed)
Name: Veronica Colon   MRN: [id number]    DOB: 03/24/1932   Date:07/12/2019       Progress Note  Chief Complaint  Patient presents with  . Follow-up  . Diabetes  . Hyperlipidemia     Subjective:   Veronica Colon is a 85 y.o. female, presents to clinic for routine follow up on the conditions listed above.  HLD/atherosclerotic CVD - pt stopped statin to see if it would improve myalgias and due to age She did not feel any difference in her pain and asks about restarting statin meds Lab Results  Component Value Date   CHOL 271 (H) 03/12/2019   HDL 98 03/12/2019   LDLCALC 145 (H) 03/12/2019   TRIG 151 (H) 03/12/2019   CHOLHDL 2.8 03/12/2019   - Current Diet:  healthy - Denies: Chest pain, shortness of breath, myalgias. - Documented aortic atherosclerosis? Yes  Diabetes Mellitus Type II: Currently managing with diet, has been stable and well controlled Pt likely developed DM from chronic steroid use causing hyperglycemia, she has tapered off steroids for temporal arteritis and is seen by rheumatology   She is not checking cbgs she does not suspect hypoglycemic episodes Denies: Polyuria, polydipsia, polyphagia, vision changes, or neuropathy  Recent pertinent labs: Lab Results  Component Value Date   HGBA1C 6.2 (H) 03/12/2019   HGBA1C 6.9 (A) 08/01/2018   HGBA1C 6.9 08/01/2018   HGBA1C 6.9 (A) 08/01/2018   HGBA1C 6.9 08/01/2018    CKD Renal function has recently fluctuated, GFR about 8 months ago was 46, sCr 1.24, repeated Nov improved to GFR 57 and sCr 1.03 - CKD stage three.    Hypertension:  Currently managed on spironolactone Pt reports good med compliance and denies any SE.  No lightheadedness, hypotension, syncope. Blood pressure today is well controlled. BP Readings from Last 3 Encounters:  07/12/19 136/62  05/23/19 125/70  03/12/19 134/82  Pt denies CP, SOB, exertional sx, LE edema, palpitation, Ha's, visual disturbances  Iron deficiency with last labs,  recheck  Leg pain/previously described as myalgias, neuropathic pain - reviewed today, was treating iron deficiency and using Lyrica to help with sx, sx do sound consistent with RLS, worse sx at night, lyrica is helping with sx, but still have pain, has only tried 25 mg at bedtime, has not tried BID dosing or any titration.      Patient Active Problem List   Diagnosis Date Noted  . Type 2 diabetes mellitus with obesity ([place]) 08/01/2018  . Hx of iron deficiency anemia 07/26/2018  . Pedal edema 03/01/2017  . Temporal arteritis ([place]) 10/28/2016  . Cervical pain 10/06/2016  . Anxiety and depression 04/20/2016  . Allergic rhinitis 02/26/2015  . H/O malignant neoplasm of breast 02/26/2015  . H/O malignant neoplasm of colon 02/26/2015  . GERD (gastroesophageal reflux disease) 02/26/2015  . Hypertensive CKD (chronic kidney disease) 02/26/2015  . CKD (chronic kidney disease), stage III 02/26/2015    Past Surgical History:  Procedure Laterality Date  . ABDOMINAL HYSTERECTOMY    . BREAST SURGERY    . COLONOSCOPY WITH PROPOFOL N/A 06/04/2015   Procedure: COLONOSCOPY WITH PROPOFOL;  Surgeon: Manya Silvas, MD;  Location: [facility] ENDOSCOPY;  Service: Endoscopy;  Laterality: N/A;  . MASTECTOMY Left 1990   BREAST CA  . TEMPORAL ARTERY BIOPSY / LIGATION  11/2016    Family History  Problem Relation Age of Onset  . Breast cancer Mother 72  . Cancer Mother  breast  . Cancer Father        lung    Social History   Socioeconomic History  . Marital status: Widowed    Spouse name: Not on file  . Number of children: 1  . Years of education: 62  . Highest education level: Not on file  Occupational History  . Occupation: retired  Tobacco Use  . Smoking status: Never Smoker  . Smokeless tobacco: Never Used  Substance and Sexual Activity  . Alcohol use: No    Alcohol/week: 0.0 standard drinks  . Drug use: No  . Sexual activity: Not Currently  Other Topics Concern  . Not on file   Social History Narrative   Pt lives with her daughter   Social Determinants of Health   Financial Resource Strain:   . Difficulty of Paying Living Expenses: Not on file  Food Insecurity:   . Worried About Charity fundraiser in the Last Year: Not on file  . Ran Out of Food in the Last Year: Not on file  Transportation Needs:   . Lack of Transportation (Medical): Not on file  . Lack of Transportation (Non-Medical): Not on file  Physical Activity: Inactive  . Days of Exercise per Week: 0 days  . Minutes of Exercise per Session: 0 min  Stress:   . Feeling of Stress : Not on file  Social Connections: Unknown  . Frequency of Communication with Friends and Family: Not on file  . Frequency of Social Gatherings with Friends and Family: Not on file  . Attends Religious Services: Not on file  . Active Member of Clubs or Organizations: Not on file  . Attends Archivist Meetings: Not on file  . Marital Status: Divorced  Human resources officer Violence:   . Fear of Current or Ex-Partner: Not on file  . Emotionally Abused: Not on file  . Physically Abused: Not on file  . Sexually Abused: Not on file     Current Outpatient Medications:  .  ACTEMRA ACTPEN 162 MG/0.9ML SOAJ, , Disp: , Rfl:  .  aspirin 81 MG tablet, Take 81 mg by mouth daily., Disp: , Rfl:  .  fluticasone (FLONASE) 50 MCG/ACT nasal spray, Place 2 sprays into both nostrils daily., Disp: 48 g, Rfl: 3 .  Multiple Vitamin (MULTIVITAMIN) tablet, Take 1 tablet by mouth daily., Disp: , Rfl:  .  OS-CAL CALCIUM + D3 500-200 MG-UNIT TABS, Take 1 tablet by mouth 2 (two) times daily with a meal., Disp: , Rfl: 11 .  pregabalin (LYRICA) 25 MG capsule, Take 1 capsule (25 mg total) by mouth at bedtime., Disp: 30 capsule, Rfl: 2 .  spironolactone (ALDACTONE) 50 MG tablet, TAKE 1 TABLET BY MOUTH EVERY DAY, Disp: 90 tablet, Rfl: 1 .  ascorbic acid (VITAMIN C) 1000 MG tablet, Take by mouth., Disp: , Rfl:   Allergies  Allergen Reactions   . Hydrochlorothiazide Other (See Comments)    Hypokalemia   . Lisinopril Other (See Comments) and Cough    Hands numb   . Metformin And Related     Fatigue, "heart was beating slower"   I personally reviewed active problem list, medication list, allergies, family history, social history, health maintenance, notes from last encounter, lab results, imaging with the patient/caregiver today.   Review of Systems  Constitutional: Negative.   HENT: Negative.   Eyes: Negative.   Respiratory: Negative.   Cardiovascular: Negative.   Gastrointestinal: Negative.   Endocrine: Negative.   Genitourinary: Negative.  Musculoskeletal: Negative.   Skin: Negative.   Allergic/Immunologic: Negative.   Neurological: Negative.   Hematological: Negative.   Psychiatric/Behavioral: Negative.   All other systems reviewed and are negative.    Objective:    Vitals:   07/12/19 0907  BP: 136/62  Pulse: 69  Resp: 14  Temp: 97.8 F (36.6 C)  SpO2: 95%  Weight: 193 lb 6.4 oz (87.7 kg)  Height: 5\' 4"  (1.626 m)    Body mass index is 33.2 kg/m.  Physical Exam Vitals and nursing note reviewed.  Constitutional:      General: She is not in acute distress.    Appearance: Normal appearance. She is well-developed. She is not ill-appearing, toxic-appearing or diaphoretic.     Interventions: Face mask in place.  HENT:     Head: Normocephalic and atraumatic.     Right Ear: External ear normal.     Left Ear: External ear normal.  Eyes:     General: Lids are normal. No scleral icterus.       Right eye: No discharge.        Left eye: No discharge.     Conjunctiva/sclera: Conjunctivae normal.  Neck:     Trachea: Phonation normal. No tracheal deviation.  Cardiovascular:     Rate and Rhythm: Normal rate and regular rhythm.     Pulses: Normal pulses.          Radial pulses are 2+ on the right side and 2+ on the left side.       Posterior tibial pulses are 2+ on the right side and 2+ on the left  side.     Heart sounds: Normal heart sounds. No murmur. No friction rub. No gallop.   Pulmonary:     Effort: Pulmonary effort is normal. No respiratory distress.     Breath sounds: Normal breath sounds. No stridor. No wheezing, rhonchi or rales.  Chest:     Chest wall: No tenderness.  Abdominal:     General: Bowel sounds are normal. There is no distension.     Palpations: Abdomen is soft.     Tenderness: There is no abdominal tenderness. There is no guarding or rebound.  Musculoskeletal:        General: No deformity. Normal range of motion.     Cervical back: Normal range of motion and neck supple.     Right lower leg: No edema.     Left lower leg: No edema.  Lymphadenopathy:     Cervical: No cervical adenopathy.  Skin:    General: Skin is warm and dry.     Capillary Refill: Capillary refill takes less than 2 seconds.     Coloration: Skin is not jaundiced or pale.     Findings: No rash.  Neurological:     Mental Status: She is alert and oriented to person, place, and time.     Motor: No abnormal muscle tone.     Gait: Gait normal.  Psychiatric:        Speech: Speech normal.        Behavior: Behavior normal.      Recent Results (from the past 2160 hour(s))  CK (Creatine Kinase)     Status: None   Collection Time: 05/25/19 12:00 AM  Result Value Ref Range   Total CK 69 29 - 143 U/L  CMP w GFR     Status: Abnormal   Collection Time: 05/25/19 12:00 AM  Result Value Ref Range   Glucose, Bld 145 (H)  65 - 99 mg/dL    Comment: .            Fasting reference interval . For someone without known diabetes, a glucose value >125 mg/dL indicates that they may have diabetes and this should be confirmed with a follow-up test. .    BUN 14 7 - 25 mg/dL   Creat 1.03 (H) 0.60 - 0.88 mg/dL    Comment: For patients >51 years of age, the reference limit for Creatinine is approximately 13% higher for people identified as African-American. .    GFR, Est Non African American 50 (L) >  OR = 60 mL/min/1.67m2   GFR, Est African American 57 (L) > OR = 60 mL/min/1.52m2   BUN/Creatinine Ratio 14 6 - 22 (calc)   Sodium 141 135 - 146 mmol/L   Potassium 4.0 3.5 - 5.3 mmol/L   Chloride 105 98 - 110 mmol/L   CO2 27 20 - 32 mmol/L   Calcium 9.8 8.6 - 10.4 mg/dL   Total Protein 5.7 (L) 6.1 - 8.1 g/dL   Albumin 4.1 3.6 - 5.1 g/dL   Globulin 1.6 (L) 1.9 - 3.7 g/dL (calc)   AG Ratio 2.6 (H) 1.0 - 2.5 (calc)   Total Bilirubin 0.7 0.2 - 1.2 mg/dL   Alkaline phosphatase (APISO) 40 37 - 153 U/L   AST 23 10 - 35 U/L   ALT 19 6 - 29 U/L    Diabetic Foot Exam: Diabetic Foot Exam - Simple   No data filed       PHQ2/9: Depression screen [facility] 2/9 07/12/2019 05/23/2019 03/12/2019 01/25/2019 10/13/2018  Decreased Interest 0 0 0 0 0  Down, Depressed, Hopeless 0 0 0 0 0  PHQ - 2 Score 0 0 0 0 0  Altered sleeping 0 0 0 - 0  Tired, decreased energy 0 0 0 - 0  Change in appetite 0 0 0 - 0  Feeling bad or failure about yourself  0 0 0 - 0  Trouble concentrating 0 0 0 - 0  Moving slowly or fidgety/restless 0 0 0 - 0  Suicidal thoughts 0 0 0 - 0  PHQ-9 Score 0 0 0 - 0  Difficult doing work/chores Not difficult at all Not difficult at all Not difficult at all - Not difficult at all  Some recent data might be hidden    phq 9 is negative reviewed  Fall Risk: Fall Risk  07/12/2019 05/23/2019 03/12/2019 01/25/2019 10/13/2018  Falls in the past year? 0 0 0 0 0  Comment - - - - -  Number falls in past yr: 0 0 0 0 -  Injury with Fall? 0 0 0 0 -  Follow up - - - Falls prevention discussed -      Functional Status Survey: Is the patient deaf or have difficulty hearing?: Yes Does the patient have difficulty seeing, even when wearing glasses/contacts?: No Does the patient have difficulty concentrating, remembering, or making decisions?: No Does the patient have difficulty walking or climbing stairs?: No Does the patient have difficulty dressing or bathing?: No Does the patient have difficulty  doing errands alone such as visiting a doctor's office or shopping?: Yes    Assessment & Plan:   1. Pure hypercholesterolemia Off statin due to possible SE, no real change to sx and CK was negative, discussed benefit/SE of statin for age - Lipid Panel - CMET - [facility]  2. Type 2 diabetes mellitus with obesity (HCC) Diet controlled, has been well  controlled and stable - CMET - [facility] - A1C  3. Hypertensive kidney disease with stage 3 chronic kidney disease, unspecified whether stage 3a or 3b CKD Stable, check labs con't meds - CMET - [facility]  4. Iron deficiency Recheck iron panel - Iron, TIBC and Ferritin Panel  5. Stage 3a chronic kidney disease Monitor kidney function   Pt wants to do labs when she goes to [facility] for her rheumatology labs - I explained how I am not sure if they will do our labs but I ordered them through [facility] so she can see.  She is adamant about minimal lab draws  No follow-ups on file.   Delsa Grana, PA-C 07/12/19 9:24 AM

## 2019-07-18 ENCOUNTER — Telehealth: Payer: Self-pay

## 2019-07-18 ENCOUNTER — Ambulatory Visit: Payer: Self-pay

## 2019-07-18 NOTE — Chronic Care Management (AMB) (Signed)
  Chronic Care Management   Outreach Note  07/18/2019 Name: Veronica Colon MRN: [id number] DOB: 1932-02-20  Primary Care Provider: Delsa Grana, PA-C Reason for referral : Chronic Care Management     Third unsuccessful telephone outreach was attempted today. [name] was referred to the care management team for assistance with chronic care management and care coordination. Her primary care provider will be notified of our unsuccessful attempts to establish and maintain contact. The care management team will gladly outreach at any time in the future if she is interested in receiving assistance.   Follow Up Plan:  The care management team will gladly follow up with [name] after the primary care provider has a conversation with her regarding recommendation for care management engagement and subsequent re-referral for care management services.     [place] [phone]

## 2019-10-16 DIAGNOSIS — M47816 Spondylosis without myelopathy or radiculopathy, lumbar region: Secondary | ICD-10-CM | POA: Insufficient documentation

## 2019-11-17 ENCOUNTER — Other Ambulatory Visit: Payer: Self-pay

## 2019-11-17 ENCOUNTER — Ambulatory Visit
Admission: EM | Admit: 2019-11-17 | Discharge: 2019-11-17 | Disposition: A | Discharge status: Home / Self Care | Payer: Medicare Other | Attending: Family Medicine | Admitting: Family Medicine

## 2019-11-17 DIAGNOSIS — R112 Nausea with vomiting, unspecified: Secondary | ICD-10-CM | POA: Diagnosis present

## 2019-11-17 DIAGNOSIS — N179 Acute kidney failure, unspecified: Secondary | ICD-10-CM | POA: Diagnosis present

## 2019-11-17 DIAGNOSIS — R1032 Left lower quadrant pain: Secondary | ICD-10-CM | POA: Diagnosis not present

## 2019-11-17 LAB — CBC WITH DIFFERENTIAL/PLATELET
Abs Immature Granulocytes: 0.02 10*3/uL (ref 0.00–0.07)
Basophils Absolute: 0 10*3/uL (ref 0.0–0.1)
Basophils Relative: 0 %
Eosinophils Absolute: 0 10*3/uL (ref 0.0–0.5)
Eosinophils Relative: 0 %
HCT: 43.1 % (ref 36.0–46.0)
Hemoglobin: 14 g/dL (ref 12.0–15.0)
Immature Granulocytes: 0 %
Lymphocytes Relative: 12 %
Lymphs Abs: 0.9 10*3/uL (ref 0.7–4.0)
MCH: 28.6 pg (ref 26.0–34.0)
MCHC: 32.5 g/dL (ref 30.0–36.0)
MCV: 88.1 fL (ref 80.0–100.0)
Monocytes Absolute: 0.5 10*3/uL (ref 0.1–1.0)
Monocytes Relative: 6 %
Neutro Abs: 6.6 10*3/uL (ref 1.7–7.7)
Neutrophils Relative %: 82 %
Platelets: 192 10*3/uL (ref 150–400)
RBC: 4.89 MIL/uL (ref 3.87–5.11)
RDW: 13.2 % (ref 11.5–15.5)
WBC: 8.1 10*3/uL (ref 4.0–10.5)
nRBC: 0 % (ref 0.0–0.2)

## 2019-11-17 LAB — URINALYSIS, COMPLETE (UACMP) WITH MICROSCOPIC
Bilirubin Urine: NEGATIVE
Glucose, UA: NEGATIVE mg/dL
Ketones, ur: NEGATIVE mg/dL
Leukocytes,Ua: NEGATIVE
Nitrite: NEGATIVE
Protein, ur: NEGATIVE mg/dL
Specific Gravity, Urine: 1.02 (ref 1.005–1.030)
pH: 6 (ref 5.0–8.0)

## 2019-11-17 LAB — CREATININE, SERUM
Creatinine, Ser: 2.03 mg/dL — ABNORMAL HIGH (ref 0.44–1.00)
GFR calc Af Amer: 25 mL/min — ABNORMAL LOW (ref >60)
GFR calc non Af Amer: 22 mL/min — ABNORMAL LOW (ref >60)

## 2019-11-17 NOTE — Discharge Instructions (Signed)
Go straight to the ER. ° °Best of luck. ° °Dr. Teyon Odette  °

## 2019-11-17 NOTE — ED Triage Notes (Addendum)
Patient complains of left lower quadrant abdominal pain that started this morning upon wakening up. Patient states that she felt like her lower abdomen was swollen after eating.   Patient also states that she has been unable to keep any food or drink down. Patient states that she has vomited each time.

## 2019-11-17 NOTE — ED Provider Notes (Signed)
[facility]    CSN: [id number] Arrival date & time: 11/17/19  1310      History   Chief Complaint Chief Complaint  Patient presents with  . Abdominal Pain    left   HPI  86 year old female presents with left lower quadrant pain, nausea, vomiting.  Started abruptly this morning.  Patient reports left lower quadrant pain.  8/10 in severity.  No relieving factors.  She has attempted to eat and drink today but has been unable to keep anything down.  Continues to have ongoing nausea.  No current vomiting.  No history of diverticulitis.  Patient does report some urinary pressure when she goes to the restroom.  No other reported urinary symptoms.  No documented fever.  No known inciting factor.  No known exacerbating factors.  No other complaints.  Past Medical History:  Diagnosis Date  . Allergy   . Breast cancer ([place]) 1990   LT MASTECTOMY  . Cancer ([place]) 1990   LT BREAST MASTECTOMY  . Diabetes mellitus without complication ([place])   . GERD (gastroesophageal reflux disease)   . Hypertension   . Personal history of chemotherapy   . Status post chemotherapy    breast cancer  . Temporal arteritis ([place]) 10/28/2016   Overview:  Right temporal arteritis  Biopsy proven 11/08/2016    Patient Active Problem List   Diagnosis Date Noted  . Type 2 diabetes mellitus with obesity ([place]) 08/01/2018  . Hx of iron deficiency anemia 07/26/2018  . Pedal edema 03/01/2017  . Temporal arteritis ([place]) 10/28/2016  . Cervical pain 10/06/2016  . Anxiety and depression 04/20/2016  . Allergic rhinitis 02/26/2015  . H/O malignant neoplasm of breast 02/26/2015  . H/O malignant neoplasm of colon 02/26/2015  . GERD (gastroesophageal reflux disease) 02/26/2015  . Hypertensive CKD (chronic kidney disease) 02/26/2015  . CKD (chronic kidney disease), stage III 02/26/2015    Past Surgical History:  Procedure Laterality Date  . ABDOMINAL HYSTERECTOMY    . BREAST SURGERY    . COLONOSCOPY WITH  PROPOFOL N/A 06/04/2015   Procedure: COLONOSCOPY WITH PROPOFOL;  Surgeon: Manya Silvas, MD;  Location: [facility] ENDOSCOPY;  Service: Endoscopy;  Laterality: N/A;  . MASTECTOMY Left 1990   BREAST CA  . TEMPORAL ARTERY BIOPSY / LIGATION  11/2016    OB History   No obstetric history on file.      Home Medications    Prior to Admission medications   Medication Sig Start Date End Date Taking? Authorizing Provider  ascorbic acid (VITAMIN C) 1000 MG tablet Take by mouth.   Yes [provider]  aspirin 81 MG tablet Take 81 mg by mouth daily.   Yes [provider]  fluticasone (FLONASE) 50 MCG/ACT nasal spray Place 2 sprays into both nostrils daily. 08/01/18  Yes Lada, Satira Anis, MD  Multiple Vitamin (MULTIVITAMIN) tablet Take 1 tablet by mouth daily.   Yes [provider]  OS-CAL CALCIUM + D3 500-200 MG-UNIT TABS Take 1 tablet by mouth 2 (two) times daily with a meal. 10/25/17  Yes [provider]  pregabalin (LYRICA) 25 MG capsule Take 1 capsule (25 mg total) by mouth 2 (two) times daily. 07/12/19  Yes Delsa Grana, PA-C  spironolactone (ALDACTONE) 50 MG tablet TAKE 1 TABLET BY MOUTH EVERY DAY 06/18/19  Yes Laurell Roof  ACTEMRA ACTPEN 162 MG/0.9ML SOAJ  01/17/19   [provider]    Family History Family History  Problem Relation Age of Onset  . Breast  cancer Mother 51  . Cancer Mother        breast  . Cancer Father        lung    Social History Social History   Tobacco Use  . Smoking status: Never Smoker  . Smokeless tobacco: Never Used  Substance Use Topics  . Alcohol use: No    Alcohol/week: 0.0 standard drinks  . Drug use: No     Allergies   Hydrochlorothiazide, Lisinopril, and Metformin and related   Review of Systems Review of Systems  Constitutional: Positive for appetite change.  Gastrointestinal: Positive for abdominal pain, nausea and vomiting.  Genitourinary:       Urinary pressure.   Physical Exam Triage  Vital Signs ED Triage Vitals [11/17/19 1325]  Enc Vitals Group     BP (!) 142/79     Pulse Rate 62     Resp 16     Temp 98.7 F (37.1 C)     Temp Source Oral     SpO2 99 %     Weight 193 lb (87.5 kg)     Height 5\' 4"  (1.626 m)     Head Circumference      Peak Flow      Pain Score 8     Pain Loc      Pain Edu?      Excl. in [place]?    Updated Vital Signs BP (!) 142/79 (BP Location: Left Arm)   Pulse 62   Temp 98.7 F (37.1 C) (Oral)   Resp 16   Ht 5\' 4"  (1.626 m)   Wt 87.5 kg   LMP  (LMP Unknown)   SpO2 99%   BMI 33.13 kg/m   Visual Acuity Right Eye Distance:   Left Eye Distance:   Bilateral Distance:    Right Eye Near:   Left Eye Near:    Bilateral Near:     Physical Exam Vitals and nursing note reviewed.  Constitutional:      General: She is not in acute distress.    Appearance: She is well-developed. She is not ill-appearing.  HENT:     Head: Normocephalic and atraumatic.  Eyes:     General:        Right eye: No discharge.        Left eye: No discharge.     Conjunctiva/sclera: Conjunctivae normal.  Cardiovascular:     Rate and Rhythm: Normal rate and regular rhythm.  Pulmonary:     Effort: Pulmonary effort is normal.     Breath sounds: Normal breath sounds. No wheezing, rhonchi or rales.  Abdominal:     General: There is no distension.     Palpations: Abdomen is soft.     Comments: Mild left lower quadrant tenderness.  Neurological:     Mental Status: She is alert.  Psychiatric:        Mood and Affect: Mood normal.        Behavior: Behavior normal.    UC Treatments / Results  Labs (all labs ordered are listed, but only abnormal results are displayed) Labs Reviewed  URINALYSIS, COMPLETE (UACMP) WITH MICROSCOPIC - Abnormal; Notable for the following components:      Result Value   Hgb urine dipstick MODERATE (*)    Bacteria, UA FEW (*)    All other components within normal limits  CREATININE, SERUM - Abnormal; Notable for the following  components:   Creatinine, Ser 2.03 (*)    GFR calc non Af Wyvonnia Lora  22 (*)    GFR calc Af Amer 25 (*)    All other components within normal limits  CBC WITH DIFFERENTIAL/PLATELET    EKG   Radiology No results found.  Procedures Procedures (including critical care time)  Medications Ordered in UC Medications - No data to display  Initial Impression / Assessment and Plan / UC Course  I have reviewed the triage vital signs and the nursing notes.  Pertinent labs & imaging results that were available during my care of the patient were reviewed by me and considered in my medical decision making (see chart for details).    86 year old female presents with acute onset left lower quadrant pain, nausea, vomiting.  Patient was a difficult stick due to the fact that she is dehydrated.  No leukocytosis.  Urine microscopy revealed pyuria as well as some hematuria.  Concern for UTI.  Full metabolic panel was unable to be obtained as it hemolyzed.  We were able to run a creatinine and it was 2.03.  Her last creatinine as of December 2020 was 1.1.  Given ongoing pain, dehydration, acute kidney injury I have advised the patient and her daughter that she needs to go to the hospital for further evaluation and management.  They are in agreement.  They are going to [facility].  Final Clinical Impressions(s) / UC Diagnoses   Final diagnoses:  LLQ abdominal pain  Non-intractable vomiting with nausea, unspecified vomiting type  AKI (acute kidney injury) [facility])     Discharge Instructions     Go straight to the ER.  Best of luck  Dr. Lacinda Axon    ED Prescriptions    None     PDMP not reviewed this encounter.   Coral Spikes, [place] 11/17/19 1537

## 2019-12-06 ENCOUNTER — Other Ambulatory Visit: Payer: Self-pay | Admitting: Family Medicine

## 2020-01-07 ENCOUNTER — Encounter: Payer: Self-pay | Admitting: Family Medicine

## 2020-01-07 ENCOUNTER — Other Ambulatory Visit: Payer: Self-pay

## 2020-01-07 ENCOUNTER — Ambulatory Visit (INDEPENDENT_AMBULATORY_CARE_PROVIDER_SITE_OTHER): Payer: Medicare Other | Admitting: Family Medicine

## 2020-01-07 VITALS — BP 118/72 | HR 68 | Temp 97.9°F | Resp 16 | Ht 64.0 in | Wt 182.0 lb

## 2020-01-07 DIAGNOSIS — N183 Chronic kidney disease, stage 3 unspecified: Secondary | ICD-10-CM

## 2020-01-07 DIAGNOSIS — I129 Hypertensive chronic kidney disease with stage 1 through stage 4 chronic kidney disease, or unspecified chronic kidney disease: Secondary | ICD-10-CM | POA: Diagnosis not present

## 2020-01-07 DIAGNOSIS — E7800 Pure hypercholesterolemia, unspecified: Secondary | ICD-10-CM | POA: Diagnosis not present

## 2020-01-07 DIAGNOSIS — R55 Syncope and collapse: Secondary | ICD-10-CM

## 2020-01-07 DIAGNOSIS — E669 Obesity, unspecified: Secondary | ICD-10-CM

## 2020-01-07 DIAGNOSIS — E559 Vitamin D deficiency, unspecified: Secondary | ICD-10-CM | POA: Diagnosis not present

## 2020-01-07 DIAGNOSIS — K219 Gastro-esophageal reflux disease without esophagitis: Secondary | ICD-10-CM | POA: Diagnosis not present

## 2020-01-07 DIAGNOSIS — N179 Acute kidney failure, unspecified: Secondary | ICD-10-CM

## 2020-01-07 DIAGNOSIS — E1169 Type 2 diabetes mellitus with other specified complication: Secondary | ICD-10-CM

## 2020-01-07 DIAGNOSIS — R011 Cardiac murmur, unspecified: Secondary | ICD-10-CM

## 2020-01-07 DIAGNOSIS — R634 Abnormal weight loss: Secondary | ICD-10-CM

## 2020-01-07 NOTE — Progress Notes (Signed)
Name: Veronica Colon   MRN: [id number]    DOB: June 25, 1932   Date:01/07/2020       Progress Note  Chief Complaint  Patient presents with  . Follow-up  . Hypertension  . Hyperlipidemia  . Chronic Kidney Disease     Subjective:   Veronica Colon is a 86 y.o. female, presents to clinic for routine f/up  Recent UTI/nephrolithiasis - [facility] - CT A/P showed hydronephrosis/stranding, urine was positive for e.coli She had back pain and presented with LLQ pain, everything improved She had AKI - GFR normally 50's down to 20-30 in UC and ER 11/17/2019 Reviewed her urgent care visit and her ER visit today including lab work from both, imaging results, micro.  She completed antibiotics.  There was no visualize kidney stone but it was suspected She says that they told her she was dehydrated at the time and she was given some fluids, she was sent home and she has overall been drinking and urinating like normal, no further abdominal pain flank pain she denies urinary frequency urgency or change in urine output denies hematuria  She does endorse decreased appetite since her shot (Actemra act pen from specialist)  She has lost some weight since her last office visit Wt Readings from Last 5 Encounters:  01/07/20 182 lb (82.6 kg)  11/17/19 193 lb (87.5 kg)  07/12/19 193 lb 6.4 oz (87.7 kg)  05/23/19 190 lb (86.2 kg)  03/12/19 195 lb 12.8 oz (88.8 kg)   BMI Readings from Last 5 Encounters:  01/07/20 31.24 kg/m  11/17/19 33.13 kg/m  07/12/19 33.20 kg/m  05/23/19 32.61 kg/m  03/12/19 33.61 kg/m      Hypertension:  Currently managed on spironolactone 50 mg Pt reports good med compliance and denies any SE.  No lightheadedness, hypotension Blood pressure today is well controlled. BP Readings from Last 6 Encounters:  01/07/20 118/72  11/17/19 (!) 142/79  07/12/19 136/62  05/23/19 125/70  03/12/19 134/82  01/25/19 (!) 142/66   Pt denies CP, SOB, exertional sx, LE edema,  palpitation, Ha's, visual disturbances  She had a fall last Thursday after getting a shot the day before.  She woke up had a headache, was a little dizzy, passed out and woke up on the floor, nothing hurts.  Fell in the kitchen, nothing obviously injured, she still feels a little worn down - slight HA, she has decreased appetite over the last week.  Weight is down 10 lbs Injection was actemra actpen from rheumatology      Current Outpatient Medications:  .  ACTEMRA ACTPEN 162 MG/0.9ML SOAJ, , Disp: , Rfl:  .  ascorbic acid (VITAMIN C) 1000 MG tablet, Take by mouth., Disp: , Rfl:  .  aspirin 81 MG tablet, Take 81 mg by mouth daily., Disp: , Rfl:  .  fluticasone (FLONASE) 50 MCG/ACT nasal spray, Place 2 sprays into both nostrils daily., Disp: 48 g, Rfl: 3 .  Multiple Vitamin (MULTIVITAMIN) tablet, Take 1 tablet by mouth daily., Disp: , Rfl:  .  OS-CAL CALCIUM + D3 500-200 MG-UNIT TABS, Take 1 tablet by mouth 2 (two) times daily with a meal., Disp: , Rfl: 11 .  pregabalin (LYRICA) 25 MG capsule, Take 1 capsule (25 mg total) by mouth 2 (two) times daily., Disp: 60 capsule, Rfl: 2 .  spironolactone (ALDACTONE) 50 MG tablet, TAKE 1 TABLET BY MOUTH EVERY DAY, Disp: 90 tablet, Rfl: 3  Patient Active Problem List   Diagnosis Date Noted  .  Type 2 diabetes mellitus with obesity ([place]) 08/01/2018  . Hx of iron deficiency anemia 07/26/2018  . Pedal edema 03/01/2017  . Temporal arteritis ([place]) 10/28/2016  . Cervical pain 10/06/2016  . Anxiety and depression 04/20/2016  . Allergic rhinitis 02/26/2015  . H/O malignant neoplasm of breast 02/26/2015  . H/O malignant neoplasm of colon 02/26/2015  . GERD (gastroesophageal reflux disease) 02/26/2015  . Hypertensive CKD (chronic kidney disease) 02/26/2015  . CKD (chronic kidney disease), stage III 02/26/2015    Past Surgical History:  Procedure Laterality Date  . ABDOMINAL HYSTERECTOMY    . BREAST SURGERY    . COLONOSCOPY WITH PROPOFOL N/A 06/04/2015    Procedure: COLONOSCOPY WITH PROPOFOL;  Surgeon: Manya Silvas, MD;  Location: [facility] ENDOSCOPY;  Service: Endoscopy;  Laterality: N/A;  . MASTECTOMY Left 1990   BREAST CA  . TEMPORAL ARTERY BIOPSY / LIGATION  11/2016    Family History  Problem Relation Age of Onset  . Breast cancer Mother 26  . Cancer Mother        breast  . Cancer Father        lung    Social History   Tobacco Use  . Smoking status: Never Smoker  . Smokeless tobacco: Never Used  Vaping Use  . Vaping Use: Never used  Substance Use Topics  . Alcohol use: No    Alcohol/week: 0.0 standard drinks  . Drug use: No     Allergies  Allergen Reactions  . Hydrochlorothiazide Other (See Comments)    Hypokalemia   . Lisinopril Other (See Comments) and Cough    Hands numb   . Metformin And Related     Fatigue, "heart was beating slower"    Health Maintenance  Topic Date Due  . COVID-19 Vaccine (1) Never done  . OPHTHALMOLOGY EXAM  05/13/2019  . FOOT EXAM  08/02/2019  . HEMOGLOBIN A1C  09/09/2019  . INFLUENZA VACCINE  02/10/2020  . URINE MICROALBUMIN  03/11/2020  . TETANUS/TDAP  04/13/2025  . DEXA SCAN  Completed  . PNA vac Low Risk Adult  Completed  . MAMMOGRAM  Discontinued    Chart Review Today: I personally reviewed active problem list, medication list, allergies, family history, social history, health maintenance, notes from last encounter, lab results, imaging with the patient/caregiver today.   Review of Systems  10 Systems reviewed and are negative for acute change except as noted in the HPI.   Objective:   Vitals:   01/07/20 0858  BP: 118/72  Pulse: 68  Resp: 16  Temp: 97.9 F (36.6 C)  TempSrc: Temporal  SpO2: 94%  Weight: 182 lb (82.6 kg)  Height: 5\' 4"  (1.626 m)    Body mass index is 31.24 kg/m.  Physical Exam Vitals and nursing note reviewed.  Constitutional:      General: She is not in acute distress.    Appearance: Normal appearance. She is well-developed. She is  not ill-appearing, toxic-appearing or diaphoretic.     Interventions: Face mask in place.  HENT:     Head: Normocephalic and atraumatic.     Right Ear: External ear normal.     Left Ear: External ear normal.  Eyes:     General: Lids are normal. No scleral icterus.       Right eye: No discharge.        Left eye: No discharge.     Conjunctiva/sclera: Conjunctivae normal.  Neck:     Trachea: Phonation normal. No tracheal deviation.  Cardiovascular:  Rate and Rhythm: Normal rate and regular rhythm.     Pulses: Normal pulses.          Radial pulses are 2+ on the right side and 2+ on the left side.       Posterior tibial pulses are 2+ on the right side and 2+ on the left side.     Heart sounds: Murmur heard.  No friction rub. No gallop.   Pulmonary:     Effort: Pulmonary effort is normal. No respiratory distress.     Breath sounds: Normal breath sounds. No stridor. No wheezing, rhonchi or rales.  Chest:     Chest wall: No tenderness.  Abdominal:     General: Bowel sounds are normal. There is no distension.     Palpations: Abdomen is soft.     Tenderness: There is no abdominal tenderness. There is no guarding or rebound.  Musculoskeletal:        General: No deformity. Normal range of motion.     Cervical back: Normal range of motion and neck supple.     Right lower leg: No edema.     Left lower leg: No edema.  Lymphadenopathy:     Cervical: No cervical adenopathy.  Skin:    General: Skin is warm and dry.     Capillary Refill: Capillary refill takes less than 2 seconds.     Coloration: Skin is not jaundiced or pale.     Findings: No rash.  Neurological:     Mental Status: She is alert and oriented to person, place, and time.     Motor: No abnormal muscle tone.     Gait: Gait normal.  Psychiatric:        Speech: Speech normal.        Behavior: Behavior normal.         Assessment & Plan:     ICD-10-CM   1. Hypertensive kidney disease with stage 3 chronic kidney  disease, unspecified whether stage 3a or 3b CKD  O67.6 COMPLETE METABOLIC PANEL WITH GFR   N18.30    BP lower than her normal today with syncopal episode last week, pt advised to monitor, given BP info, may need to decrease spirololactone to 25 mg  2. Pure hypercholesterolemia  H20.94 COMPLETE METABOLIC PANEL WITH GFR   she stopped statin over the past year due to possible SE and age - last lipids checked Dec  3. Vitamin D deficiency  B09.6 COMPLETE METABOLIC PANEL WITH GFR   She is supplementing with vitamin D and calcium  4. Gastroesophageal reflux disease, unspecified whether esophagitis present  K21.9    She is having some decreased appetite and weight loss but denies any worsening GERD symptoms  5. Type 2 diabetes mellitus with obesity (HCC)  G83.66 COMPLETE METABOLIC PANEL WITH GFR   E66.9 Hemoglobin A1c   recheck A1C - doubt she need tx, will monitor  6. Stage 3 chronic kidney disease, unspecified whether stage 3a or 3b CKD  Q94.76 COMPLETE METABOLIC PANEL WITH GFR   Kidney function has been stable but a drop with ER and urgent care visits about 2 months ago  7. Unexplained weight loss  R63.4    Decreased appetite and unintentional weight loss  8. Syncope, unspecified syncope type  R55    Syncopal episode following injection encouraged her to follow-up with her specialist who prescribes it, follow-up in clinic if recurrent syncope or near syncope  9. AKI (acute kidney injury) ([place])  N17.9  AKI in May with ER and urgent care visit, patient states they told her she was dehydrated and gave her IV fluids will recheck renal function today  10. Murmur, cardiac  R01.1    Murmur unclear if this has been worked up before she denies exertional symptoms, chest pain, lower extremity edema     A1C - 6.4 last here dec 2020  AKI at [facility] and ER in May - recheck today  BP good today- a little lower than her normal, if renal function low -may decrease med from 50 to 25 if blood pressure continues  to be lower or if she has any symptoms of hypotension, lightheadedness, near syncope etc. with some weight loss she may be able to decrease her spironolactone dose    Return in about 3 months (around 04/08/2020) for 1 month f/up weight loss and HTN/A1C.   Delsa Grana, PA-C 01/07/20 9:18 AM

## 2020-01-07 NOTE — Patient Instructions (Addendum)
I will call you about your blood sugar labs and let you know if we need to treat anything  Watch your blood pressure at home and PLEASE come in right away if you feel light headed, pass out, feel short of breath with walking, have chest pressure or pain, any palpitations, trouble breathing when you lie down at night.  Monitor your blood pressure and let me know if it goes any lower than today (118/72) - we may want to decrease your blood pressure pill dose

## 2020-01-08 ENCOUNTER — Telehealth: Payer: Self-pay

## 2020-01-08 ENCOUNTER — Encounter: Payer: Self-pay | Admitting: Family Medicine

## 2020-01-08 DIAGNOSIS — N183 Chronic kidney disease, stage 3 unspecified: Secondary | ICD-10-CM

## 2020-01-08 LAB — COMPLETE METABOLIC PANEL WITH GFR
AG Ratio: 1.6 (calc) (ref 1.0–2.5)
ALT: 18 U/L (ref 6–29)
AST: 25 U/L (ref 10–35)
Albumin: 3.9 g/dL (ref 3.6–5.1)
Alkaline phosphatase (APISO): 70 U/L (ref 37–153)
BUN/Creatinine Ratio: 16 (calc) (ref 6–22)
BUN: 28 mg/dL — ABNORMAL HIGH (ref 7–25)
CO2: 25 mmol/L (ref 20–32)
Calcium: 9.9 mg/dL (ref 8.6–10.4)
Chloride: 105 mmol/L (ref 98–110)
Creat: 1.74 mg/dL — ABNORMAL HIGH (ref 0.60–0.88)
GFR, Est African American: 30 mL/min/{1.73_m2} — ABNORMAL LOW (ref >=60)
GFR, Est Non African American: 26 mL/min/{1.73_m2} — ABNORMAL LOW (ref >=60)
Globulin: 2.4 g/dL (calc) (ref 1.9–3.7)
Glucose, Bld: 112 mg/dL — ABNORMAL HIGH (ref 65–99)
Potassium: 4.3 mmol/L (ref 3.5–5.3)
Sodium: 139 mmol/L (ref 135–146)
Total Bilirubin: 0.4 mg/dL (ref 0.2–1.2)
Total Protein: 6.3 g/dL (ref 6.1–8.1)

## 2020-01-08 LAB — HEMOGLOBIN A1C
Hgb A1c MFr Bld: 6.3 % of total Hgb — ABNORMAL HIGH (ref <5.7)
Mean Plasma Glucose: 134 (calc)
eAG (mmol/L): 7.4 (calc)

## 2020-01-08 NOTE — Telephone Encounter (Signed)
-----   Message from Delsa Grana, [place] sent at 01/08/2020  7:44 AM EDT ----- Please let the pt know her A1C is good.   Her kidney function is still much lower than her normal Please have her take a half dose of BP meds Avoid NSAIDs, push clear fluids Please put in urgent referral to nephrology for AKI - please note in comments recent decline in renal function with ER visit in May for UTI plus hydronephrosis

## 2020-01-16 ENCOUNTER — Telehealth: Payer: Self-pay | Admitting: Family Medicine

## 2020-01-16 NOTE — Telephone Encounter (Signed)
Copied from [place] [id number]. Topic: Medicare AWV >> Jan 16, 2020 11:52 AM Cher Nakai R wrote: Reason for CRM:  01/16/20 Need to reschedule AWV with [facility]  to 7/27 or 7/29 due to schedule change-srs

## 2020-01-29 ENCOUNTER — Ambulatory Visit: Payer: Medicare Other

## 2020-02-04 ENCOUNTER — Ambulatory Visit (INDEPENDENT_AMBULATORY_CARE_PROVIDER_SITE_OTHER): Payer: Medicare Other | Admitting: Family Medicine

## 2020-02-04 ENCOUNTER — Other Ambulatory Visit: Payer: Self-pay

## 2020-02-04 ENCOUNTER — Encounter: Payer: Self-pay | Admitting: Family Medicine

## 2020-02-04 VITALS — BP 135/70 | HR 59 | Ht 64.0 in | Wt 182.0 lb

## 2020-02-04 DIAGNOSIS — N179 Acute kidney failure, unspecified: Secondary | ICD-10-CM | POA: Diagnosis not present

## 2020-02-04 DIAGNOSIS — I10 Essential (primary) hypertension: Secondary | ICD-10-CM

## 2020-02-04 DIAGNOSIS — R634 Abnormal weight loss: Secondary | ICD-10-CM

## 2020-02-04 NOTE — Progress Notes (Signed)
Name: Veronica Colon   MRN: [id number]    DOB: 10-04-1931   Date:02/04/2020       Progress Note  Subjective:    Chief Complaint  Chief Complaint  Patient presents with  . Follow-up    1 month  . Hypertension  . Diabetes  . Weight Check    has remained the same   Called patient 3x without answer or VM 12:20 PM Will attempt again    I connected with  Bayan Hedstrom Felker on 02/04/20 at 11:20 AM EDT by telephone and verified that I am speaking with the correct person using two identifiers.   I discussed the limitations, risks, security and privacy concerns of performing an evaluation and management service by telephone and the availability of in person appointments. Staff also discussed with the patient that there may be a patient responsible charge related to this service.   Patient Location: home Provider Location: home office Additional Individuals present: none  HPI  Pt presents for f/up on weight and AKI She was ill with UTI and went to UC and then ER and was treated for suspected kidney stone vs UTI?  Renal function had dropped and pt gradually improved and completed antibiotics, she lost some weight.  With our last OV her renal function remained lower than her baseline. Renal function recent labs:  Lab Results  Component Value Date   GFRAA 30 (L) 01/07/2020   GFRAA 25 (L) 11/17/2019   GFRAA 57 (L) 05/25/2019    Lab Results  Component Value Date   CREATININE 1.74 (H) 01/07/2020   BUN 28 (H) 01/07/2020   NA 139 01/07/2020   K 4.3 01/07/2020   CL 105 01/07/2020   CO2 25 01/07/2020   She f/up here today for weight recheck Wt Readings from Last 5 Encounters:  02/04/20 182 lb (82.6 kg)  01/07/20 182 lb (82.6 kg)  11/17/19 193 lb (87.5 kg)  07/12/19 193 lb 6.4 oz (87.7 kg)  05/23/19 190 lb (86.2 kg)   BMI Readings from Last 5 Encounters:  02/04/20 31.24 kg/m  01/07/20 31.24 kg/m  11/17/19 33.13 kg/m  07/12/19 33.20 kg/m  05/23/19 32.61 kg/m    Patient  Active Problem List   Diagnosis Date Noted  . Type 2 diabetes mellitus with obesity ([place]) 08/01/2018  . Hx of iron deficiency anemia 07/26/2018  . Pedal edema 03/01/2017  . Temporal arteritis ([place]) 10/28/2016  . Cervical pain 10/06/2016  . Anxiety and depression 04/20/2016  . Allergic rhinitis 02/26/2015  . H/O malignant neoplasm of breast 02/26/2015  . H/O malignant neoplasm of colon 02/26/2015  . GERD (gastroesophageal reflux disease) 02/26/2015  . Hypertensive CKD (chronic kidney disease) 02/26/2015  . CKD (chronic kidney disease), stage III 02/26/2015    Social History   Tobacco Use  . Smoking status: Never Smoker  . Smokeless tobacco: Never Used  Substance Use Topics  . Alcohol use: No    Alcohol/week: 0.0 standard drinks     Current Outpatient Medications:  .  ascorbic acid (VITAMIN C) 1000 MG tablet, Take by mouth., Disp: , Rfl:  .  aspirin 81 MG tablet, Take 81 mg by mouth daily., Disp: , Rfl:  .  fluticasone (FLONASE) 50 MCG/ACT nasal spray, Place 2 sprays into both nostrils daily., Disp: 48 g, Rfl: 3 .  Multiple Vitamin (MULTIVITAMIN) tablet, Take 1 tablet by mouth daily., Disp: , Rfl:  .  OS-CAL CALCIUM + D3 500-200 MG-UNIT TABS, Take 1 tablet by mouth 2 (two)  times daily with a meal., Disp: , Rfl: 11 .  spironolactone (ALDACTONE) 50 MG tablet, TAKE 1 TABLET BY MOUTH EVERY DAY, Disp: 90 tablet, Rfl: 3 .  ACTEMRA ACTPEN 162 MG/0.9ML SOAJ, , Disp: , Rfl:  .  pregabalin (LYRICA) 25 MG capsule, Take 1 capsule (25 mg total) by mouth 2 (two) times daily. (Patient not taking: Reported on 02/04/2020), Disp: 60 capsule, Rfl: 2  Allergies  Allergen Reactions  . Hydrochlorothiazide Other (See Comments)    Hypokalemia   . Lisinopril Other (See Comments) and Cough    Hands numb   . Metformin And Related     Fatigue, "heart was beating slower"    Chart Review: I personally reviewed active problem list, medication list, allergies, family history, social history, health  maintenance, notes from last encounter, lab results, imaging with the patient/caregiver today.   Review of Systems 10 Systems reviewed and are negative for acute change except as noted in the HPI.   Objective:    Virtual encounter, vitals limited, only able to obtain the following Today's Vitals   02/04/20 1106  BP: (!) 135/70  Pulse: 59  Weight: 182 lb (82.6 kg)  Height: 5\' 4"  (1.626 m)   Body mass index is 31.24 kg/m. Nursing Note and Vital Signs reviewed.  Physical Exam Pt alert, phonation clear, no audible distress PE limited by telephone encounter  No results found for this or any previous visit (from the past 72 hour(s)).  Assessment and Plan:     ICD-10-CM   1. AKI (acute kidney injury) ([place])  N17.9    needs repeated labs  2. Unexplained weight loss  R63.4    weight has been stable  3. Essential hypertension  I10    stable well controlled     - I discussed the assessment and treatment plan with the patient. The patient was provided an opportunity to ask questions and all were answered. The patient agreed with the plan and demonstrated an understanding of the instructions.  - The patient was advised to call back or seek an in-person evaluation if the symptoms worsen or if the condition fails to improve as anticipated.  I provided 20+ minutes of non-face-to-face time during this encounter.  Delsa Grana, PA-C 02/04/20 12:10 PM

## 2020-02-06 ENCOUNTER — Ambulatory Visit: Payer: Medicare Other | Admitting: Family Medicine

## 2020-02-07 ENCOUNTER — Ambulatory Visit (INDEPENDENT_AMBULATORY_CARE_PROVIDER_SITE_OTHER): Payer: Medicare Other

## 2020-02-07 ENCOUNTER — Other Ambulatory Visit: Payer: Self-pay

## 2020-02-07 VITALS — BP 130/80 | HR 62 | Temp 96.9°F | Resp 16 | Ht 64.0 in | Wt 177.1 lb

## 2020-02-07 DIAGNOSIS — Z0000 Encounter for general adult medical examination without abnormal findings: Secondary | ICD-10-CM

## 2020-02-07 NOTE — Patient Instructions (Signed)
Veronica Colon , Thank you for taking time to come for your Medicare Wellness Visit. I appreciate your ongoing commitment to your health goals. Please review the following plan we discussed and let me know if I can assist you in the future.   Screening recommendations/referrals: Colonoscopy: no longer required Mammogram: done 04/06/17 Bone Density: done 01/31/17 Recommended yearly ophthalmology/optometry visit for glaucoma screening and checkup Recommended yearly dental visit for hygiene and checkup  Vaccinations: Influenza vaccine: done 05/07/19 Pneumococcal vaccine: done 04/10/14 Tdap vaccine: done 04/14/15 Shingles vaccine: Shingrix discussed. Please contact your pharmacy for coverage information.  Covid-19: done 12/05/19 & 01/02/20  Advanced directives: Advance directive discussed with you today. I have provided a copy for you to complete at home and have notarized. Once this is complete please bring a copy in to our office so we can scan it into your chart.  Conditions/risks identified: Recommend increasing physical activity   Next appointment: Follow up in one year for your annual wellness visit    Preventive Care 65 Years and Older, Female Preventive care refers to lifestyle choices and visits with your health care provider that can promote health and wellness. What does preventive care include?  A yearly physical exam. This is also called an annual well check.  Dental exams once or twice a year.  Routine eye exams. Ask your health care provider how often you should have your eyes checked.  Personal lifestyle choices, including:  Daily care of your teeth and gums.  Regular physical activity.  Eating a healthy diet.  Avoiding tobacco and drug use.  Limiting alcohol use.  Practicing safe sex.  Taking low-dose aspirin every day.  Taking vitamin and mineral supplements as recommended by your health care provider. What happens during an annual well check? The services and  screenings done by your health care provider during your annual well check will depend on your age, overall health, lifestyle risk factors, and family history of disease. Counseling  Your health care provider may ask you questions about your:  Alcohol use.  Tobacco use.  Drug use.  Emotional well-being.  Home and relationship well-being.  Sexual activity.  Eating habits.  History of falls.  Memory and ability to understand (cognition).  Work and work Statistician.  Reproductive health. Screening  You may have the following tests or measurements:  Height, weight, and BMI.  Blood pressure.  Lipid and cholesterol levels. These may be checked every 5 years, or more frequently if you are over 75 years old.  Skin check.  Lung cancer screening. You may have this screening every year starting at age 41 if you have a 30-pack-year history of smoking and currently smoke or have quit within the past 15 years.  Fecal occult blood test (FOBT) of the stool. You may have this test every year starting at age 34.  Flexible sigmoidoscopy or colonoscopy. You may have a sigmoidoscopy every 5 years or a colonoscopy every 10 years starting at age 33.  Hepatitis C blood test.  Hepatitis B blood test.  Sexually transmitted disease (STD) testing.  Diabetes screening. This is done by checking your blood sugar (glucose) after you have not eaten for a while (fasting). You may have this done every 1-3 years.  Bone density scan. This is done to screen for osteoporosis. You may have this done starting at age 59.  Mammogram. This may be done every 1-2 years. Talk to your health care provider about how often you should have regular mammograms. Talk with your  health care provider about your test results, treatment options, and if necessary, the need for more tests. Vaccines  Your health care provider may recommend certain vaccines, such as:  Influenza vaccine. This is recommended every  year.  Tetanus, diphtheria, and acellular pertussis (Tdap, Td) vaccine. You may need a Td booster every 10 years.  Zoster vaccine. You may need this after age 9.  Pneumococcal 13-valent conjugate (PCV13) vaccine. One dose is recommended after age 55.  Pneumococcal polysaccharide (PPSV23) vaccine. One dose is recommended after age 18. Talk to your health care provider about which screenings and vaccines you need and how often you need them. This information is not intended to replace advice given to you by your health care provider. Make sure you discuss any questions you have with your health care provider. Document Released: 07/25/2015 Document Revised: 03/17/2016 Document Reviewed: 04/29/2015 Elsevier Interactive Patient Education  2017 [employer] Prevention in the Home Falls can cause injuries. They can happen to people of all ages. There are many things you can do to make your home safe and to help prevent falls. What can I do on the outside of my home?  Regularly fix the edges of walkways and driveways and fix any cracks.  Remove anything that might make you trip as you walk through a door, such as a raised step or threshold.  Trim any bushes or trees on the path to your home.  Use bright outdoor lighting.  Clear any walking paths of anything that might make someone trip, such as rocks or tools.  Regularly check to see if handrails are loose or broken. Make sure that both sides of any steps have handrails.  Any raised decks and porches should have guardrails on the edges.  Have any leaves, snow, or ice cleared regularly.  Use sand or salt on walking paths during winter.  Clean up any spills in your garage right away. This includes oil or grease spills. What can I do in the bathroom?  Use night lights.  Install grab bars by the toilet and in the tub and shower. Do not use towel bars as grab bars.  Use non-skid mats or decals in the tub or shower.  If you  need to sit down in the shower, use a plastic, non-slip stool.  Keep the floor dry. Clean up any water that spills on the floor as soon as it happens.  Remove soap buildup in the tub or shower regularly.  Attach bath mats securely with double-sided non-slip rug tape.  Do not have throw rugs and other things on the floor that can make you trip. What can I do in the bedroom?  Use night lights.  Make sure that you have a light by your bed that is easy to reach.  Do not use any sheets or blankets that are too big for your bed. They should not hang down onto the floor.  Have a firm chair that has side arms. You can use this for support while you get dressed.  Do not have throw rugs and other things on the floor that can make you trip. What can I do in the kitchen?  Clean up any spills right away.  Avoid walking on wet floors.  Keep items that you use a lot in easy-to-reach places.  If you need to reach something above you, use a strong step stool that has a grab bar.  Keep electrical cords out of the way.  Do not use  floor polish or wax that makes floors slippery. If you must use wax, use non-skid floor wax.  Do not have throw rugs and other things on the floor that can make you trip. What can I do with my stairs?  Do not leave any items on the stairs.  Make sure that there are handrails on both sides of the stairs and use them. Fix handrails that are broken or loose. Make sure that handrails are as long as the stairways.  Check any carpeting to make sure that it is firmly attached to the stairs. Fix any carpet that is loose or worn.  Avoid having throw rugs at the top or bottom of the stairs. If you do have throw rugs, attach them to the floor with carpet tape.  Make sure that you have a light switch at the top of the stairs and the bottom of the stairs. If you do not have them, ask someone to add them for you. What else can I do to help prevent falls?  Wear shoes  that:  Do not have high heels.  Have rubber bottoms.  Are comfortable and fit you well.  Are closed at the toe. Do not wear sandals.  If you use a stepladder:  Make sure that it is fully opened. Do not climb a closed stepladder.  Make sure that both sides of the stepladder are locked into place.  Ask someone to hold it for you, if possible.  Clearly mark and make sure that you can see:  Any grab bars or handrails.  First and last steps.  Where the edge of each step is.  Use tools that help you move around (mobility aids) if they are needed. These include:  Canes.  Walkers.  Scooters.  Crutches.  Turn on the lights when you go into a dark area. Replace any light bulbs as soon as they burn out.  Set up your furniture so you have a clear path. Avoid moving your furniture around.  If any of your floors are uneven, fix them.  If there are any pets around you, be aware of where they are.  Review your medicines with your doctor. Some medicines can make you feel dizzy. This can increase your chance of falling. Ask your doctor what other things that you can do to help prevent falls. This information is not intended to replace advice given to you by your health care provider. Make sure you discuss any questions you have with your health care provider. Document Released: 04/24/2009 Document Revised: 12/04/2015 Document Reviewed: 08/02/2014 Elsevier Interactive Patient Education  2017 [employer].

## 2020-02-07 NOTE — Progress Notes (Signed)
Subjective:   Veronica Colon is a 86 y.o. female who presents for Medicare Annual (Subsequent) preventive examination.  Review of Systems     Cardiac Risk Factors include: advanced age (>50men, >86 women);diabetes mellitus;hypertension;dyslipidemia;obesity (BMI >30kg/m2);sedentary lifestyle     Objective:    Today's Vitals   02/07/20 1129  BP: (!) 130/80  Pulse: 62  Resp: 16  Temp: (!) 96.9 F (36.1 C)  TempSrc: Temporal  SpO2: 99%  Weight: 177 lb 1.6 oz (80.3 kg)  Height: 5\' 4"  (1.626 m)   Body mass index is 30.4 kg/m.  Advanced Directives 02/07/2020 11/17/2019 01/25/2019 08/11/2018 08/07/2018 07/01/2017 10/16/2016  Does Patient Have a Medical Advance Directive? No No No No No No No  Type of Advance Directive - - - - - - -  Would patient like information on creating a medical advance directive? Yes (MAU/Ambulatory/Procedural Areas - Information given) - Yes (MAU/Ambulatory/Procedural Areas - Information given) Yes (MAU/Ambulatory/Procedural Areas - Information given) - Yes (MAU/Ambulatory/Procedural Areas - Information given) No - Patient declined    Current Medications (verified) Outpatient Encounter Medications as of 02/07/2020  Medication Sig  . ascorbic acid (VITAMIN C) 1000 MG tablet Take by mouth.  Marland Kitchen aspirin 81 MG tablet Take 81 mg by mouth daily.  . fluticasone (FLONASE) 50 MCG/ACT nasal spray Place 2 sprays into both nostrils daily.  . Multiple Vitamin (MULTIVITAMIN) tablet Take 1 tablet by mouth daily.  Dellia Nims CALCIUM + D3 500-200 MG-UNIT TABS Take 1 tablet by mouth 2 (two) times daily with a meal.  . spironolactone (ALDACTONE) 50 MG tablet TAKE 1 TABLET BY MOUTH EVERY DAY  . [DISCONTINUED] ACTEMRA ACTPEN 162 MG/0.9ML SOAJ    No facility-administered encounter medications on file as of 02/07/2020.    Allergies (verified) Hydrochlorothiazide, Lisinopril, and Metformin and related   History: Past Medical History:  Diagnosis Date  . Allergy   . Anxiety and  depression   . Anxiety and depression   . Arthritis   . Breast cancer ([place]) 1990   LT MASTECTOMY  . Cancer ([place]) 1990   LT BREAST MASTECTOMY  . CKD (chronic kidney disease), stage III   . Diabetes mellitus without complication ([place])   . GERD (gastroesophageal reflux disease)   . Hypertension   . Hypertensive CKD (chronic kidney disease)   . Personal history of chemotherapy   . Status post chemotherapy    breast cancer  . Temporal arteritis ([place]) 10/28/2016   Overview:  Right temporal arteritis  Biopsy proven 11/08/2016   Past Surgical History:  Procedure Laterality Date  . ABDOMINAL HYSTERECTOMY    . BREAST SURGERY    . COLONOSCOPY WITH PROPOFOL N/A 06/04/2015   Procedure: COLONOSCOPY WITH PROPOFOL;  Surgeon: Manya Silvas, MD;  Location: [facility] ENDOSCOPY;  Service: Endoscopy;  Laterality: N/A;  . MASTECTOMY Left 1990   BREAST CA  . TEMPORAL ARTERY BIOPSY / LIGATION  11/2016   Family History  Problem Relation Age of Onset  . Breast cancer Mother 85  . Cancer Mother        breast  . Cancer Father        lung   Social History   Socioeconomic History  . Marital status: Widowed    Spouse name: Not on file  . Number of children: 1  . Years of education: 46  . Highest education level: Not on file  Occupational History  . Occupation: retired  Tobacco Use  . Smoking status: Never Smoker  . Smokeless tobacco: Never  Used  Vaping Use  . Vaping Use: Never used  Substance and Sexual Activity  . Alcohol use: No    Alcohol/week: 0.0 standard drinks  . Drug use: No  . Sexual activity: Not Currently  Other Topics Concern  . Not on file  Social History Narrative   Pt lives with her daughter   Social Determinants of Health   Financial Resource Strain: Low Risk   . Difficulty of Paying Living Expenses: Not hard at all  Food Insecurity: No Food Insecurity  . Worried About Charity fundraiser in the Last Year: Never true  . Ran Out of Food in the Last Year: Never true    Transportation Needs: No Transportation Needs  . Lack of Transportation (Medical): No  . Lack of Transportation (Non-Medical): No  Physical Activity: Inactive  . Days of Exercise per Week: 0 days  . Minutes of Exercise per Session: 0 min  Stress: No Stress Concern Present  . Feeling of Stress : Not at all  Social Connections: Moderately Integrated  . Frequency of Communication with Friends and Family: More than three times a week  . Frequency of Social Gatherings with Friends and Family: Twice a week  . Attends Religious Services: More than 4 times per year  . Active Member of Clubs or Organizations: Yes  . Attends Archivist Meetings: More than 4 times per year  . Marital Status: Divorced    Tobacco Counseling Counseling given: Not Answered   Clinical Intake:  Pre-visit preparation completed: Yes  Pain : No/denies pain     BMI - recorded: 30.4 Nutritional Status: BMI > 30  Obese Nutritional Risks: None Diabetes: Yes CBG done?: No Did pt. bring in CBG monitor from home?: No  How often do you need to have someone help you when you read instructions, pamphlets, or other written materials from your doctor or pharmacy?: 1 - Never  Nutrition Risk Assessment:  Has the patient had any N/V/D within the last 2 months?  No  Does the patient have any non-healing wounds?  No  Has the patient had any unintentional weight loss or weight gain?  Yes   Diabetes:  Is the patient diabetic?  Yes  If diabetic, was a CBG obtained today?  No  Did the patient bring in their glucometer from home?  No  How often do you monitor your CBG's? Pt does not actively check blood sugar.   Financial Strains and Diabetes Management:  Are you having any financial strains with the device, your supplies or your medication? No .  Does the patient want to be seen by Chronic Care Management for management of their diabetes?  No  Would the patient like to be referred to a Nutritionist or for  Diabetic Management?  No   Diabetic Exams:  Diabetic Eye Exam: Completed 05/12/18. Overdue for diabetic eye exam. Pt has been advised about the importance in completing this exam. Pt states appt schedule for Oct or Nov 2021.  Diabetic Foot Exam: Completed 08/01/18. Pt has been advised about the importance in completing this exam. Pt is scheduled for diabetic foot exam on 04/08/20.    Interpreter Needed?: No  Information entered by :: Clemetine Marker LPN   Activities of Daily Living In your present state of health, do you have any difficulty performing the following activities: 02/07/2020 02/04/2020  Hearing? Tempie Donning  Comment wears hearing aids -  Vision? N N  Difficulty concentrating or making decisions? N N  Walking  or climbing stairs? N N  Dressing or bathing? N N  Doing errands, shopping? N N  Preparing Food and eating ? N -  Using the Toilet? N -  In the past six months, have you accidently leaked urine? N -  Do you have problems with loss of bowel control? N -  Managing your Medications? N -  Managing your Finances? N -  Housekeeping or managing your Housekeeping? N -  Some recent data might be hidden    Patient Care Team: Delsa Grana, PA-C as PCP - General (Family Medicine) Marlowe Sax, MD as Referring Physician (Internal Medicine) Birder Robson, MD as Referring Physician (Ophthalmology) Neldon Labella, RN as Case Manager  Indicate any recent Medical Services you may have received from other than [facility] providers in the past year (date may be approximate).     Assessment:   This is a routine wellness examination for Shital.  Hearing/Vision screen  Hearing Screening   125Hz  250Hz  500Hz  1000Hz  2000Hz  3000Hz  4000Hz  6000Hz  8000Hz   Right ear:           Left ear:           Comments: Hearing aids maintained by [facility]  Vision Screening Comments: Annual vision screenings done by [facility]  Dietary issues and exercise activities discussed: Current  Exercise Habits: The patient does not participate in regular exercise at present, Exercise limited by: orthopedic condition(s)  Goals    . DIET - INCREASE WATER INTAKE     recommend drinking at least 6-8 glasses of water a day       Depression Screen PHQ 2/9 Scores 02/07/2020 02/04/2020 01/07/2020 07/12/2019 05/23/2019 03/12/2019 01/25/2019  PHQ - 2 Score 0 0 0 0 0 0 0  PHQ- 9 Score - 0 0 0 0 0 -    Fall Risk Fall Risk  02/07/2020 02/04/2020 01/07/2020 07/12/2019 05/23/2019  Falls in the past year? 1 0 1 0 0  Comment - - - - -  Number falls in past yr: 1 0 0 0 0  Injury with Fall? 0 0 0 0 0  Risk for fall due to : Impaired mobility;Impaired balance/gait;Medication side effect - - - -  Follow up Falls prevention discussed - Falls evaluation completed - -    Any stairs in or around the home? Yes  If so, are there any without handrails? No  Home free of loose throw rugs in walkways, pet beds, electrical cords, etc? Yes  Adequate lighting in your home to reduce risk of falls? Yes   ASSISTIVE DEVICES UTILIZED TO PREVENT FALLS:  Life alert? No  Use of a cane, walker or w/c? Yes  Grab bars in the bathroom? Yes  Shower chair or bench in shower? Yes  Elevated toilet seat or a handicapped toilet? No   TIMED UP AND GO:  Was the test performed? Yes .  Length of time to ambulate 10 feet: 7 sec.   Gait slow and steady with assistive device  Cognitive Function:     6CIT Screen 02/07/2020 01/25/2019 07/01/2017  What Year? 0 points 0 points 0 points  What month? 0 points 0 points 0 points  What time? 0 points 0 points 0 points  Count back from 20 0 points 0 points 0 points  Months in reverse 0 points 0 points 0 points  Repeat phrase 4 points 2 points 0 points  Total Score 4 2 0    Immunizations Immunization History  Administered Date(s) Administered  .  Fluad Quad(high Dose 65+) 04/16/2019, 05/07/2019  . Influenza, High Dose Seasonal PF 04/20/2016, 03/29/2017  . Influenza,inj,Quad  PF,6+ Mos 04/14/2015  . Influenza-Unspecified 03/29/2012, 04/04/2013, 04/10/2014, 04/14/2015, 04/20/2016, 04/11/2018, 04/16/2019  . Moderna SARS-COVID-2 Vaccination 12/05/2019, 01/02/2020  . Pneumococcal Conjugate-13 04/10/2014  . Pneumococcal Polysaccharide-23 03/03/2003  . Td 01/30/2002  . Tdap 04/14/2015  . Zoster 04/10/2014    TDAP status: Up to date   Flu Vaccine status: Up to date   Pneumococcal vaccine status: Up to date   Covid-19 vaccine status: Completed vaccines  Qualifies for Shingles Vaccine? Yes   Zostavax completed Yes   Shingrix Completed?: No.    Education has been provided regarding the importance of this vaccine. Patient has been advised to call insurance company to determine out of pocket expense if they have not yet received this vaccine. Advised may also receive vaccine at local pharmacy or Health Dept. Verbalized acceptance and understanding.  Screening Tests Health Maintenance  Topic Date Due  . OPHTHALMOLOGY EXAM  05/13/2019  . FOOT EXAM  08/02/2019  . URINE MICROALBUMIN  03/11/2020  . INFLUENZA VACCINE  02/10/2020  . HEMOGLOBIN A1C  07/08/2020  . TETANUS/TDAP  04/13/2025  . DEXA SCAN  Completed  . COVID-19 Vaccine  Completed  . PNA vac Low Risk Adult  Completed  . MAMMOGRAM  Discontinued    Health Maintenance  Health Maintenance Due  Topic Date Due  . OPHTHALMOLOGY EXAM  05/13/2019  . FOOT EXAM  08/02/2019  . URINE MICROALBUMIN  03/11/2020    Colorectal cancer screening: No longer required.    Mammogram Status: Right unilateral mammogram completed 04/06/17. Pt states she did not complete mammogram last year due to Covid & plans to discuss at next office visit.   Bone Density status: Completed 01/31/17. Results reflect: Bone density results: NORMAL. Repeat every 2 years. Pt to discuss at next visit for screening recommendations due to normal bone density.   Lung Cancer Screening: (Low Dose CT Chest recommended if Age 64-80 years, 30 pack-year  currently smoking OR have quit w/in 15years.) does not qualify.   Additional Screening:  Hepatitis C Screening: no longer required  Vision Screening: Recommended annual ophthalmology exams for early detection of glaucoma and other disorders of the eye. Is the patient up to date with their annual eye exam?  Yes  Who is the provider or what is the name of the office in which the patient attends annual eye exams? [place] Screening: Recommended annual dental exams for proper oral hygiene  Community Resource Referral / Chronic Care Management: CRR required this visit?  No   CCM required this visit?  No      Plan:     I have personally reviewed and noted the following in the patient's chart:   . Medical and social history . Use of alcohol, tobacco or illicit drugs  . Current medications and supplements . Functional ability and status . Nutritional status . Physical activity . Advanced directives . List of other physicians . Hospitalizations, surgeries, and ER visits in previous 12 months . Vitals . Screenings to include cognitive, depression, and falls . Referrals and appointments  In addition, I have reviewed and discussed with patient certain preventive protocols, quality metrics, and best practice recommendations. A written personalized care plan for preventive services as well as general preventive health recommendations were provided to patient.     Clemetine Marker, LPN   10/11/270   Nurse Notes: pt accompanied to visit today by her  daughter, Veronica Colon who states pt reports decreased appetite since second Moderna Covid-19 vaccine the end of June. Pt has had a 5# weight loss since that time; denies N/V/D. Pt also used to drink Boost supplements but says she no longer has a taste for them. Pt advised to continue to monitor and contact office prior to next visit on 04/08/20 if needed.

## 2020-03-03 ENCOUNTER — Ambulatory Visit: Payer: Self-pay | Admitting: *Deleted

## 2020-03-03 NOTE — Telephone Encounter (Signed)
Patient with chronic constipation for several months. Has to strain then a normal BM occurs. Taking Colace 3xday. No blood/mucous reported/no ribbon-like stools. Bloating after meals and water for about 2 weeks. No abdominal pain/fever/vomiting/vomiting, some nausea with bloating. As a result decreased appetitie and down 5 lbs over the last month. No dietary changes. Drinks several glasses of water daily. Denies urinary difficulty. Passing gas without difficulty. Advised OTC pepto/maalox, decreased gaseous type foods for now.Be as active as possible. No available appointments over the next 2 weeks. Appointment on chart for 04/08/20. Any blood noticed/severe abdominal pain/fever/urinary problems she should seek immediate care at UC/ED. Sees nephrology on 03/11/20.  Reason for Disposition . Constipation is a chronic symptom (recurrent or ongoing AND present > 4 weeks)  Answer Assessment - Initial Assessment Questions 1. STOOL PATTERN OR FREQUENCY: "How often do you pass bowel movements (BMs)?"  (Normal range: tid to q 3 days)  "When was the last BM passed?"       Bloating after meals and water intake for 2 weeks. LBM Saturday night 2. STRAINING: "Do you have to strain to have a BM?"      Strained but normal 3. RECTAL PAIN: "Does your rectum hurt when the stool comes out?" If Yes, ask: "Do you have hemorrhoids? How bad is the pain?"  (Scale 1-10; or mild, moderate, severe)     No and No 4. STOOL COMPOSITION: "Are the stools hard?"      Normal once it starts 5. BLOOD ON STOOLS: "Has there been any blood on the toilet tissue or on the surface of the BM?" If Yes, ask: "When was the last time?"      none 6. CHRONIC CONSTIPATION: "Is this a new problem for you?"  If no, ask: "How long have you had this problem?" (days, weeks, months)      On/off since May 7. CHANGES IN DIET OR HYDRATION: "Have there been any recent changes in your diet?" "How much fluids are you drinking consuming on a daily basis?"  "How  much have you had to drink today?"     No changes in diet.Today, at least one bottle of water. 8. MEDICATIONS: "Have you been taking any new medications?" "Are you taking any narcotic pain medications?" (e.g., Vicoden, Percocet, morphine, dilaudid)     no 9. LAXATIVES: "Have you been using any stool softeners, laxatives, or enemas?"  If yes, ask "What, how often, and when was the last time?"Colace 3/day and prune juice 10.ACTIVITY:  "How much walking do you do every day? on a daily basis?"  "Has your activity level decreased in the past week?"        A little walking 11. CAUSE: "What do you think is causing the constipation?" unsure       12. OTHER SYMPTOMS: "Do you have any other symptoms?" (e.g., abdominal pain, bloating, fever, vomiting)no fever/vomiting/abdominal pain.Passing gas daily       Bloating is an 8 on the scale, resolves after about an hour.  13. MEDICAL HISTORY: "Do you have a history of hemorrhoids, rectal fissures, or rectal surgery or rectal abscess?"        na 14. PREGNANCY: "Is there any chance you are pregnant?" "When was your last menstrual period?"       na  Protocols used: CONSTIPATION-A-AH

## 2020-03-11 ENCOUNTER — Other Ambulatory Visit: Payer: Self-pay | Admitting: Nephrology

## 2020-03-11 DIAGNOSIS — N179 Acute kidney failure, unspecified: Secondary | ICD-10-CM

## 2020-03-19 ENCOUNTER — Ambulatory Visit
Admission: RE | Admit: 2020-03-19 | Discharge: 2020-03-19 | Disposition: A | Discharge status: Home / Self Care | Payer: Medicare Other | Source: Ambulatory Visit | Attending: Nephrology | Admitting: Nephrology

## 2020-03-19 ENCOUNTER — Other Ambulatory Visit: Payer: Self-pay

## 2020-03-19 DIAGNOSIS — N179 Acute kidney failure, unspecified: Secondary | ICD-10-CM

## 2020-03-24 ENCOUNTER — Ambulatory Visit: Payer: Medicare Other | Admitting: Urology

## 2020-03-24 ENCOUNTER — Other Ambulatory Visit: Payer: Self-pay

## 2020-03-24 ENCOUNTER — Encounter: Payer: Self-pay | Admitting: Urology

## 2020-03-24 VITALS — BP 145/72 | HR 80 | Wt 177.0 lb

## 2020-03-24 DIAGNOSIS — N1330 Unspecified hydronephrosis: Secondary | ICD-10-CM

## 2020-03-24 DIAGNOSIS — R3129 Other microscopic hematuria: Secondary | ICD-10-CM

## 2020-03-24 NOTE — Progress Notes (Signed)
03/24/2020 8:41 AM   Veronica Colon [birth year] [id number]  Referring provider: Murlean Iba, MD [place],  [place] [address]  No chief complaint on file.   HPI: The patient recently had a renal ultrasound that showed mild hydronephrosis.  Has low back pain worse in the morning.  Voids every 2-3 hours gets up once at night.  Has passed a kidney stone years ago.  Has had a hysterectomy.  Patient is continent and does not have a history of recurrent urinary tract infections or previous bladder surgery  PMH: Past Medical History:  Diagnosis Date  . Allergy   . Anxiety and depression   . Anxiety and depression   . Arthritis   . Breast cancer ([place]) 1990   LT MASTECTOMY  . Cancer ([place]) 1990   LT BREAST MASTECTOMY  . CKD (chronic kidney disease), stage III   . Diabetes mellitus without complication ([place])   . GERD (gastroesophageal reflux disease)   . Hypertension   . Hypertensive CKD (chronic kidney disease)   . Personal history of chemotherapy   . Status post chemotherapy    breast cancer  . Temporal arteritis ([place]) 10/28/2016   Overview:  Right temporal arteritis  Biopsy proven 11/08/2016    Surgical History: Past Surgical History:  Procedure Laterality Date  . ABDOMINAL HYSTERECTOMY    . BREAST SURGERY    . COLONOSCOPY WITH PROPOFOL N/A 06/04/2015   Procedure: COLONOSCOPY WITH PROPOFOL;  Surgeon: Manya Silvas, MD;  Location[facility] END[facility];  Service: Endoscopy;  Laterality: N/A;  . MASTECTOMY Left 1990   BREAST CA  . TEMPORAL ARTERY BIOPSY / LIGATION  11/2016    Home Medications:  Allergies as of 03/24/2020      Reactions   Hydrochlorothiazide Other (See Comments)   Hypokalemia   Lisinopril Other (See Comments), Cough   Hands numb   Metformin And Related    Fatigue, "heart was beating slower"      Medication List       Accurate as of March 24, 2020  8:41 AM. If you have any questions, ask your nurse or doctor.          ascorbic acid 1000 MG tablet Commonly known as: VITAMIN C Take by mouth.   aspirin 81 MG tablet Take 81 mg by mouth daily.   fluticasone 50 MCG/ACT nasal spray Commonly known as: FLONASE Place 2 sprays into both nostrils daily.   multivitamin tablet Take 1 tablet by mouth daily.   Os-Cal Calcium + D3 500-200 MG-UNIT Tabs Generic drug: Calcium Carb-Cholecalciferol Take 1 tablet by mouth 2 (two) times daily with a meal.   spironolactone 50 MG tablet Commonly known as: ALDACTONE TAKE 1 TABLET BY MOUTH EVERY DAY       Allergies:  Allergies  Allergen Reactions  . Hydrochlorothiazide Other (See Comments)    Hypokalemia   . Lisinopril Other (See Comments) and Cough    Hands numb   . Metformin And Related     Fatigue, "heart was beating slower"    Family History: Family History  Problem Relation Age of Onset  . Breast cancer Mother 2  . Cancer Mother        breast  . Cancer Father        lung    Social History:  reports that she has never smoked. She has never used smokeless tobacco. She reports that she does not drink alcohol and does not use drugs.  ROS:  Physical Exam: LMP  (LMP Unknown)   Constitutional:  Alert and oriented, No acute distress. HEENT: [place] AT, moist mucus membranes.  Trachea midline, no masses. Cardiovascular: No clubbing, cyanosis, or edema. Respiratory: Normal respiratory effort, no increased work of breathing. GI: Abdomen is soft, nontender, nondistended, no abdominal masses GU: No CVA tenderness.   Skin: No rashes, bruises or suspicious lesions. Lymph: No cervical or inguinal adenopathy. Neurologic: Grossly intact, no focal deficits, moving all 4 extremities. Psychiatric: Normal mood and affect.  Laboratory Data: Lab Results  Component Value Date   WBC 8.1 11/17/2019   HGB 14.0 11/17/2019   HCT 43.1 11/17/2019   MCV 88.1 11/17/2019   PLT 192 11/17/2019    Lab  Results  Component Value Date   CREATININE 1.74 (H) 01/07/2020    No results found for: PSA  No results found for: TESTOSTERONE  Lab Results  Component Value Date   HGBA1C 6.3 (H) 01/07/2020    Urinalysis    Component Value Date/Time   COLORURINE YELLOW 11/17/2019 1407   APPEARANCEUR CLEAR 11/17/2019 1407   LABSPEC 1.020 11/17/2019 1407   PHURINE 6.0 11/17/2019 1407   GLUCOSEU NEGATIVE 11/17/2019 1407   HGBUR MODERATE (A) 11/17/2019 1407   BILIRUBINUR NEGATIVE 11/17/2019 1407   KETONESUR NEGATIVE 11/17/2019 1407   PROTEINUR NEGATIVE 11/17/2019 1407   NITRITE NEGATIVE 11/17/2019 1407   LEUKOCYTESUR NEGATIVE 11/17/2019 1407    Pertinent Imaging: Reviewed.  Urinalysis reviewed.  Urine sent for culture.  Chart reviewed  Assessment & Plan: On review of the ultrasound the patient has moderate hydronephrosis.  Picture was drawn.  I will order a CT scan with and without contrast and have her see one of my partners in follow-up.  There are no diagnoses linked to this encounter.  No follow-ups on file.  Reece Packer, MD  [facility] [address] [place], [place] [address] [phone]

## 2020-03-25 LAB — BASIC METABOLIC PANEL
BUN/Creatinine Ratio: 14 (ref 12–28)
BUN: 21 mg/dL (ref 8–27)
CO2: 21 mmol/L (ref 20–29)
Calcium: 9.7 mg/dL (ref 8.7–10.3)
Chloride: 102 mmol/L (ref 96–106)
Creatinine, Ser: 1.45 mg/dL — ABNORMAL HIGH (ref 0.57–1.00)
GFR calc Af Amer: 38 mL/min/{1.73_m2} — ABNORMAL LOW (ref >59)
GFR calc non Af Amer: 33 mL/min/{1.73_m2} — ABNORMAL LOW (ref >59)
Glucose: 124 mg/dL — ABNORMAL HIGH (ref 65–99)
Potassium: 3.9 mmol/L (ref 3.5–5.2)
Sodium: 140 mmol/L (ref 134–144)

## 2020-03-25 LAB — MICROSCOPIC EXAMINATION

## 2020-03-25 LAB — URINALYSIS, COMPLETE
Bilirubin, UA: NEGATIVE
Glucose, UA: NEGATIVE
Nitrite, UA: NEGATIVE
Specific Gravity, UA: >1.03 — ABNORMAL HIGH (ref 1.005–1.030)
Urobilinogen, Ur: 0.2 mg/dL (ref 0.2–1.0)
pH, UA: 5 (ref 5.0–7.5)

## 2020-03-28 LAB — CULTURE, URINE COMPREHENSIVE

## 2020-04-04 ENCOUNTER — Encounter: Payer: Self-pay | Admitting: Oncology

## 2020-04-04 DIAGNOSIS — D472 Monoclonal gammopathy: Secondary | ICD-10-CM | POA: Insufficient documentation

## 2020-04-04 NOTE — Progress Notes (Signed)
[name]  [phone] Fax:(336) [phone]  ID: Veronica Colon OB: Mar 15, 1932  MR#: [id number]  GOT#:[id number]  Patient Care Team: Veronica Grana, PA-C as PCP - General (Family Medicine) Veronica Sax, MD as Referring Physician (Internal Medicine) Veronica Robson, MD as Referring Physician (Ophthalmology) Veronica Labella, RN as Case Manager  CHIEF COMPLAINT: MGUS  INTERVAL HISTORY: Patient is an 86 year old female who was last seen in clinic in February 2017 for stage I colon cancer.  She is referred back for evaluation of possible MGUS.  She currently feels well and is asymptomatic.  He has no neurologic complaints.  She denies any recent fevers or illnesses.  She has a good appetite and denies weight loss.  She denies any pain.  She has no chest pain, shortness of breath, cough, or hemoptysis.  She denies any nausea, vomiting, constipation, or diarrhea.  She has noted no changes in her bowel movements and denies any melena or hematochezia.  She has no urinary complaints.  Patient feels at her baseline offers no specific complaints today.  REVIEW OF SYSTEMS:   Review of Systems  Constitutional: Negative.  Negative for fever, malaise/fatigue and weight loss.  Respiratory: Negative.  Negative for cough and hemoptysis.   Cardiovascular: Negative.  Negative for chest pain and leg swelling.  Gastrointestinal: Negative.  Negative for abdominal pain, blood in stool and melena.  Genitourinary: Negative.  Negative for dysuria.  Musculoskeletal: Negative.  Negative for back pain.  Skin: Negative.  Negative for rash.  Neurological: Negative.  Negative for dizziness, focal weakness, weakness and headaches.  Psychiatric/Behavioral: Negative.  The patient is not nervous/anxious.     As per HPI. Otherwise, a complete review of systems is negative.  PAST MEDICAL HISTORY: Past Medical History:  Diagnosis Date  . Allergy   . Anxiety and depression   .  Anxiety and depression   . Arthritis   . Breast cancer ([place]) 1990   LT MASTECTOMY  . Cancer ([place]) 1990   LT BREAST MASTECTOMY  . CKD (chronic kidney disease), stage III   . Diabetes mellitus without complication ([place])   . GERD (gastroesophageal reflux disease)   . Hypertension   . Hypertensive CKD (chronic kidney disease)   . Personal history of chemotherapy   . Status post chemotherapy    breast cancer  . Temporal arteritis ([place]) 10/28/2016   Overview:  Right temporal arteritis  Biopsy proven 11/08/2016    PAST SURGICAL HISTORY: Past Surgical History:  Procedure Laterality Date  . ABDOMINAL HYSTERECTOMY    . BREAST SURGERY    . COLONOSCOPY WITH PROPOFOL N/A 06/04/2015   Procedure: COLONOSCOPY WITH PROPOFOL;  Surgeon: Manya Silvas, MD;  Location: [facility] ENDOSCOPY;  Service: Endoscopy;  Laterality: N/A;  . MASTECTOMY Left 1990   BREAST CA  . TEMPORAL ARTERY BIOPSY / LIGATION  11/2016    FAMILY HISTORY: Family History  Problem Relation Age of Onset  . Breast cancer Mother 30  . Cancer Mother        breast  . Cancer Father        lung    ADVANCED DIRECTIVES (Y/N):  N  HEALTH MAINTENANCE: Social History   Tobacco Use  . Smoking status: Never Smoker  . Smokeless tobacco: Never Used  Vaping Use  . Vaping Use: Never used  Substance Use Topics  . Alcohol use: No    Alcohol/week: 0.0 standard drinks  . Drug use: No     Colonoscopy:  PAP:  Bone density:  Lipid panel:  Allergies  Allergen Reactions  . Hydrochlorothiazide Other (See Comments)    Hypokalemia   . Lisinopril Other (See Comments) and Cough    Hands numb   . Metformin And Related     Fatigue, "heart was beating slower"    Current Outpatient Medications  Medication Sig Dispense Refill  . ascorbic acid (VITAMIN C) 1000 MG tablet Take by mouth.    Marland Kitchen aspirin 81 MG tablet Take 81 mg by mouth daily.    . fluticasone (FLONASE) 50 MCG/ACT nasal spray Place 2 sprays into both nostrils daily. 48 g 3   . Multiple Vitamin (MULTIVITAMIN) tablet Take 1 tablet by mouth daily.    Dellia Nims CALCIUM + D3 500-200 MG-UNIT TABS Take 1 tablet by mouth 2 (two) times daily with a meal.  11  . spironolactone (ALDACTONE) 50 MG tablet TAKE 1 TABLET BY MOUTH EVERY DAY 90 tablet 3   No current facility-administered medications for this visit.    OBJECTIVE: Vitals:   04/07/20 1126  BP: (!) 150/69  Pulse: 62  Resp: 20  Temp: (!) 97.5 F (36.4 C)  SpO2: 100%     Body mass index is 29.04 kg/m.    ECOG FS:0 - Asymptomatic  General: Well-developed, well-nourished, no acute distress. Eyes: Pink conjunctiva, anicteric sclera. HEENT: Normocephalic, moist mucous membranes. Lungs: No audible wheezing or coughing. Heart: Regular rate and rhythm. Abdomen: Soft, nontender, no obvious distention. Musculoskeletal: No edema, cyanosis, or clubbing. Neuro: Alert, answering all questions appropriately. Cranial nerves grossly intact. Skin: No rashes or petechiae noted. Psych: Normal affect. Lymphatics: No cervical, calvicular, axillary or inguinal LAD.   LAB RESULTS:  Lab Results  Component Value Date   NA 137 04/07/2020   K 4.1 04/07/2020   CL 102 04/07/2020   CO2 25 04/07/2020   GLUCOSE 107 (H) 04/07/2020   BUN 23 04/07/2020   CREATININE 1.73 (H) 04/07/2020   CALCIUM 9.9 04/07/2020   PROT 6.3 01/07/2020   ALBUMIN 3.6 08/07/2018   AST 25 01/07/2020   ALT 18 01/07/2020   ALKPHOS 54 08/07/2018   BILITOT 0.4 01/07/2020   GFRNONAA 26 (L) 04/07/2020   GFRAA 30 (L) 04/07/2020    Lab Results  Component Value Date   WBC 6.0 04/07/2020   NEUTROABS 4.1 04/07/2020   HGB 12.4 04/07/2020   HCT 38.0 04/07/2020   MCV 84.8 04/07/2020   PLT 174 04/07/2020     STUDIES: US RENAL  Result Date: 03/19/2020 CLINICAL DATA:  43 year old with acute kidney injury. EXAM: RENAL / URINARY TRACT ULTRASOUND COMPLETE COMPARISON:  Remote abdomen pelvis CT 07/12/2011 FINDINGS: Right Kidney: Renal measurements: 10.4 x  4.7 x 5.2 cm = volume: 133 mL. There is mild thinning of the renal parenchyma. Echogenicity within normal limits. No mass or hydronephrosis visualized. Left Kidney: Renal measurements: 11.3 x 6.1 x 4.5 cm = volume: 161 mL. There is mild hydronephrosis. 9 mm echogenic focus in the lower left kidney has no posterior shadowing. The upper ureter is dilated. No cause for obstruction visualized by ultrasound. Hydronephrosis persisted postvoid. Minimal thinning of the renal parenchyma. No evidence of focal lesion. Bladder: Only minimally distended. The right ureteral jet was visualized. The left ureteral jet is not seen. Other: None. IMPRESSION: 1. Mild left hydronephrosis and hydroureter. No cause for obstruction visualized by ultrasound. The left ureteral jet is not seen. Consider further evaluation with CT to assess for potential etiology of obstruction. 2. An echogenic focus in the lower left kidney has no  posterior shadowing and is equivalent for nonobstructing renal stone. 3. Mild bilateral renal parenchymal thinning. These results will be called to the ordering clinician or representative by the Radiologist Assistant, and communication documented in the PACS or [employer]. Electronically Signed   By: Keith Rake M.D.   On: 03/19/2020 15:41    ASSESSMENT: MGUS  PLAN:    1.  MGUS: Repeat SPEP with M spike as well as kappa/lambda free light chains are pending at time of dictation.  Patient's immunoglobulins are essentially within normal limits.  She has mild renal insufficiency, but no other evidence of endorgan damage.  Patient is at low risk of progressing to overt myeloma.  Plus, treatment will be difficult given her advanced age.  No intervention is needed at this time.  Patient does not require bone marrow biopsy or metastatic bone survey.  Have scheduled appointment for 3 months, but this can be canceled if laboratory returns back negative. 2.  History of colon cancer: No evidence of disease.   Continue follow-up with GI as indicated. 3.  Renal insufficiency: Unlikely related to underlying MGUS.  Follow-up with nephrology as scheduled.  I spent a total of 45 minutes reviewing chart data, face-to-face evaluation with the patient, counseling and coordination of care as detailed above.   Patient expressed understanding and was in agreement with this plan. She also understands that She can call clinic at any time with any questions, concerns, or complaints.    Lloyd Huger, MD   04/08/2020 2:42 PM

## 2020-04-04 NOTE — Progress Notes (Signed)
Patient previously seen by Dr. 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 Spoke with patient's daughter (primary caregiver) on Friday. She denied patient was having any pain or concerns at this time.

## 2020-04-07 ENCOUNTER — Inpatient Hospital Stay: Payer: Medicare Other | Attending: Oncology | Admitting: Oncology

## 2020-04-07 ENCOUNTER — Inpatient Hospital Stay: Payer: Medicare Other

## 2020-04-07 ENCOUNTER — Other Ambulatory Visit: Payer: Self-pay

## 2020-04-07 ENCOUNTER — Encounter: Payer: Self-pay | Admitting: Oncology

## 2020-04-07 DIAGNOSIS — Z801 Family history of malignant neoplasm of trachea, bronchus and lung: Secondary | ICD-10-CM | POA: Diagnosis not present

## 2020-04-07 DIAGNOSIS — Z9221 Personal history of antineoplastic chemotherapy: Secondary | ICD-10-CM | POA: Diagnosis not present

## 2020-04-07 DIAGNOSIS — Z803 Family history of malignant neoplasm of breast: Secondary | ICD-10-CM | POA: Insufficient documentation

## 2020-04-07 DIAGNOSIS — K219 Gastro-esophageal reflux disease without esophagitis: Secondary | ICD-10-CM | POA: Insufficient documentation

## 2020-04-07 DIAGNOSIS — Z9012 Acquired absence of left breast and nipple: Secondary | ICD-10-CM | POA: Insufficient documentation

## 2020-04-07 DIAGNOSIS — F418 Other specified anxiety disorders: Secondary | ICD-10-CM | POA: Diagnosis not present

## 2020-04-07 DIAGNOSIS — I129 Hypertensive chronic kidney disease with stage 1 through stage 4 chronic kidney disease, or unspecified chronic kidney disease: Secondary | ICD-10-CM | POA: Insufficient documentation

## 2020-04-07 DIAGNOSIS — Z9013 Acquired absence of bilateral breasts and nipples: Secondary | ICD-10-CM | POA: Insufficient documentation

## 2020-04-07 DIAGNOSIS — D472 Monoclonal gammopathy: Secondary | ICD-10-CM | POA: Insufficient documentation

## 2020-04-07 DIAGNOSIS — N183 Chronic kidney disease, stage 3 unspecified: Secondary | ICD-10-CM | POA: Insufficient documentation

## 2020-04-07 LAB — CBC WITH DIFFERENTIAL/PLATELET
Abs Immature Granulocytes: 0.02 10*3/uL (ref 0.00–0.07)
Basophils Absolute: 0 10*3/uL (ref 0.0–0.1)
Basophils Relative: 0 %
Eosinophils Absolute: 0.1 10*3/uL (ref 0.0–0.5)
Eosinophils Relative: 2 %
HCT: 38 % (ref 36.0–46.0)
Hemoglobin: 12.4 g/dL (ref 12.0–15.0)
Immature Granulocytes: 0 %
Lymphocytes Relative: 21 %
Lymphs Abs: 1.2 10*3/uL (ref 0.7–4.0)
MCH: 27.7 pg (ref 26.0–34.0)
MCHC: 32.6 g/dL (ref 30.0–36.0)
MCV: 84.8 fL (ref 80.0–100.0)
Monocytes Absolute: 0.5 10*3/uL (ref 0.1–1.0)
Monocytes Relative: 8 %
Neutro Abs: 4.1 10*3/uL (ref 1.7–7.7)
Neutrophils Relative %: 69 %
Platelets: 174 10*3/uL (ref 150–400)
RBC: 4.48 MIL/uL (ref 3.87–5.11)
RDW: 13.8 % (ref 11.5–15.5)
WBC: 6 10*3/uL (ref 4.0–10.5)
nRBC: 0 % (ref 0.0–0.2)

## 2020-04-07 LAB — BASIC METABOLIC PANEL
Anion gap: 10 (ref 5–15)
BUN: 23 mg/dL (ref 8–23)
CO2: 25 mmol/L (ref 22–32)
Calcium: 9.9 mg/dL (ref 8.9–10.3)
Chloride: 102 mmol/L (ref 98–111)
Creatinine, Ser: 1.73 mg/dL — ABNORMAL HIGH (ref 0.44–1.00)
GFR calc Af Amer: 30 mL/min — ABNORMAL LOW (ref >60)
GFR calc non Af Amer: 26 mL/min — ABNORMAL LOW (ref >60)
Glucose, Bld: 107 mg/dL — ABNORMAL HIGH (ref 70–99)
Potassium: 4.1 mmol/L (ref 3.5–5.1)
Sodium: 137 mmol/L (ref 135–145)

## 2020-04-07 NOTE — Progress Notes (Signed)
Name: Veronica Colon   MRN: [id number]    DOB: [birth year]   Date:04/08/2020       Progress Note  Chief Complaint  Patient presents with  . Follow-up    3 month f/u     Subjective:   Veronica Colon is a 86 y.o. female, presents to clinic for routine f/up  Interval hx since last OV 2 month ago  Pt has seen nephrology and urology, working up hydronephrosis and microscopic hematuria and CKD stage 3b  Renal function recent labs: Lab Results  Component Value Date   GFRAA 30 (L) 04/07/2020   GFRAA 38 (L) 03/24/2020   GFRAA 30 (L) 01/07/2020    Lab Results  Component Value Date   CREATININE 1.73 (H) 04/07/2020   BUN 23 04/07/2020   NA 137 04/07/2020   K 4.1 04/07/2020   CL 102 04/07/2020   CO2 25 04/07/2020    Pt is also seeing rheumatology and Heme/Onc She is pending a repeat CT with urology  DM - Well controlled with diet/lifestyle Lab Results  Component Value Date   HGBA1C 6.3 (H) 01/07/2020   Hypertension:  Currently managed on spironolactone  (ACEI allergy and had hypokalemia with HCTZ) Pt reports good med compliance and denies any SE.   Blood pressure today is well controlled. BP Readings from Last 10 Encounters:  04/08/20 132/78  04/07/20 (!) 150/69  03/24/20 (!) 145/72  02/07/20 (!) 130/80  02/04/20 (!) 135/70  01/07/20 118/72  11/17/19 (!) 142/79  07/12/19 136/62  05/23/19 125/70  03/12/19 134/82   Pt denies CP, SOB, exertional sx, LE edema, palpitation, Ha's, visual disturbances, lightheadedness, hypotension, syncope.  F/up on weight loss -patient has lost about 27 pounds in the past year, BMI is in overweight category, she has stopped chronic prednisone use  Diet:   Breakfast - eggs, bowl of cereal or oatmeal or grits  Lunch - she was skipping lunch but daughter helping with lunch - trying to eat left overs, soups, sandwich Dinner - home cooked meals, usually a meat and two veggies   Wt Readings from Last 10 Encounters:  04/08/20 168 lb 4.8 oz  (76.3 kg)  04/07/20 169 lb 3.2 oz (76.7 kg)  03/24/20 177 lb (80.3 kg)  02/07/20 177 lb 1.6 oz (80.3 kg)  02/04/20 182 lb (82.6 kg)  01/07/20 182 lb (82.6 kg)  11/17/19 193 lb (87.5 kg)  07/12/19 193 lb 6.4 oz (87.7 kg)  05/23/19 190 lb (86.2 kg)  03/12/19 195 lb 12.8 oz (88.8 kg)   BMI Readings from Last 5 Encounters:  04/08/20 28.89 kg/m  04/07/20 29.04 kg/m  03/24/20 30.38 kg/m  02/07/20 30.40 kg/m  02/04/20 31.24 kg/m          Current Outpatient Medications:  .  ascorbic acid (VITAMIN C) 1000 MG tablet, Take by mouth., Disp: , Rfl:  .  aspirin 81 MG tablet, Take 81 mg by mouth daily., Disp: , Rfl:  .  fluticasone (FLONASE) 50 MCG/ACT nasal spray, Place 2 sprays into both nostrils daily., Disp: 48 g, Rfl: 3 .  Multiple Vitamin (MULTIVITAMIN) tablet, Take 1 tablet by mouth daily., Disp: , Rfl:  .  OS-CAL CALCIUM + D3 500-200 MG-UNIT TABS, Take 1 tablet by mouth 2 (two) times daily with a meal., Disp: , Rfl: 11 .  spironolactone (ALDACTONE) 50 MG tablet, TAKE 1 TABLET BY MOUTH EVERY DAY, Disp: 90 tablet, Rfl: 3  Patient Active Problem List   Diagnosis Date Noted  .  Monoclonal gammopathy of unknown significance (MGUS) 04/04/2020  . Lumbar spondylosis 10/16/2019  . Type 2 diabetes mellitus with obesity ([place]) 08/01/2018  . Hx of iron deficiency anemia 07/26/2018  . Pedal edema 03/01/2017  . Temporal arteritis ([place]) 10/28/2016  . Cervical pain 10/06/2016  . Anxiety and depression 04/20/2016  . Allergic rhinitis 02/26/2015  . H/O malignant neoplasm of breast 02/26/2015  . H/O malignant neoplasm of colon 02/26/2015  . GERD (gastroesophageal reflux disease) 02/26/2015  . Hypertensive CKD (chronic kidney disease) 02/26/2015  . CKD (chronic kidney disease), stage III 02/26/2015    Past Surgical History:  Procedure Laterality Date  . ABDOMINAL HYSTERECTOMY    . BREAST SURGERY    . COLONOSCOPY WITH PROPOFOL N/A 06/04/2015   Procedure: COLONOSCOPY WITH PROPOFOL;   Surgeon: Manya Silvas, MD;  Location: [facility] ENDOSCOPY;  Service: Endoscopy;  Laterality: N/A;  . MASTECTOMY Left 1990   BREAST CA  . TEMPORAL ARTERY BIOPSY / LIGATION  11/2016    Family History  Problem Relation Age of Onset  . Breast cancer Mother 28  . Cancer Mother        breast  . Cancer Father        lung    Social History   Tobacco Use  . Smoking status: Never Smoker  . Smokeless tobacco: Never Used  Vaping Use  . Vaping Use: Never used  Substance Use Topics  . Alcohol use: No    Alcohol/week: 0.0 standard drinks  . Drug use: No     Allergies  Allergen Reactions  . Hydrochlorothiazide Other (See Comments)    Hypokalemia   . Lisinopril Other (See Comments) and Cough    Hands numb   . Metformin And Related     Fatigue, "heart was beating slower"    Health Maintenance  Topic Date Due  . OPHTHALMOLOGY EXAM  05/13/2019  . FOOT EXAM  08/02/2019  . URINE MICROALBUMIN  03/11/2020  . HEMOGLOBIN A1C  07/08/2020  . TETANUS/TDAP  04/13/2025  . INFLUENZA VACCINE  Completed  . DEXA SCAN  Completed  . COVID-19 Vaccine  Completed  . PNA vac Low Risk Adult  Completed  . MAMMOGRAM  Discontinued    Chart Review Today: I personally reviewed active problem list, medication list, allergies, family history, social history, health maintenance, notes from last encounter, lab results, imaging with the patient/caregiver today.   Review of Systems  10 Systems reviewed and are negative for acute change except as noted in the HPI.  Objective:   Vitals:   04/08/20 0946  BP: 132/78  Pulse: 64  Resp: 14  Temp: 98.2 F (36.8 C)  TempSrc: Oral  SpO2: 96%  Weight: 168 lb 4.8 oz (76.3 kg)  Height: 5\' 4"  (1.626 m)    Body mass index is 28.89 kg/m.  Physical Exam Vitals and nursing note reviewed.  Constitutional:      General: She is not in acute distress.    Appearance: Normal appearance. She is normal weight. She is not ill-appearing, toxic-appearing or  diaphoretic.  HENT:     Head: Normocephalic and atraumatic.     Right Ear: External ear normal.     Left Ear: External ear normal.  Eyes:     General:        Right eye: No discharge.        Left eye: No discharge.     Conjunctiva/sclera: Conjunctivae normal.  Cardiovascular:     Rate and Rhythm: Normal rate and regular rhythm.  Pulses: Normal pulses.     Heart sounds: Normal heart sounds.  Pulmonary:     Effort: Pulmonary effort is normal.     Breath sounds: Normal breath sounds.  Abdominal:     General: Bowel sounds are normal. There is no distension.     Palpations: Abdomen is soft.     Tenderness: There is no abdominal tenderness. There is no guarding.  Musculoskeletal:     Right lower leg: No edema.     Left lower leg: No edema.  Skin:    General: Skin is warm and dry.     Capillary Refill: Capillary refill takes less than 2 seconds.     Coloration: Skin is not jaundiced or pale.  Neurological:     Mental Status: She is alert. Mental status is at baseline.  Psychiatric:        Mood and Affect: Mood normal.        Behavior: Behavior normal.         Assessment & Plan:   1. Unexplained weight loss Patient has lost a little more weight.  Her weight and BMI are in overweight category and she does not appear sickly or cachectic.  She was previously on prednisone daily some of her weight loss may be secondary to lack of steroids.  She was ill earlier this year UTI and pyelo -she has gradually gained back her appetite.  She suspect some of her lack of appetite and symptoms have also been from her injection she is received from her specialist and over the past 1 to 2 months she is feeling much better her appetite has returned.  She denies any abdominal pain, nausea, vomiting.  Counseled on healthy diet and protein and calorie intake.  Offered a nutritional referral but she would like to wait at this time We will continue to monitor  2. Essential hypertension Stable,  well-controlled blood pressure on spironolactone  3. Pure hypercholesterolemia History of high cholesterol, not currently on medications, per chart has a history of atherosclerotic cardiovascular disease Will recheck lipid panel Due to patient's age she may not want to start statin medications - Lipid panel  4. Gastroesophageal reflux disease, unspecified whether esophagitis present Stable well-controlled, rarely has reflux, uses over-the-counter medications as needed  5. Type 2 diabetes mellitus with stage 3b chronic kidney disease, without long-term current use of insulin (HCC) Well-controlled with diet and lifestyle, last A1c was 6.3 - Hemoglobin A1c  6. Need for influenza vaccination - Flu Vaccine QUAD High Dose(Fluad)   Return in about 3 months (around 07/08/2020) for A1C/HLD weight .   Delsa Grana, PA-C 04/08/20 9:58 AM

## 2020-04-08 ENCOUNTER — Encounter: Payer: Self-pay | Admitting: Family Medicine

## 2020-04-08 ENCOUNTER — Ambulatory Visit (INDEPENDENT_AMBULATORY_CARE_PROVIDER_SITE_OTHER): Payer: Medicare Other | Admitting: Family Medicine

## 2020-04-08 VITALS — BP 132/78 | HR 64 | Temp 98.2°F | Resp 14 | Ht 64.0 in | Wt 168.3 lb

## 2020-04-08 DIAGNOSIS — K219 Gastro-esophageal reflux disease without esophagitis: Secondary | ICD-10-CM | POA: Diagnosis not present

## 2020-04-08 DIAGNOSIS — R634 Abnormal weight loss: Secondary | ICD-10-CM

## 2020-04-08 DIAGNOSIS — E1121 Type 2 diabetes mellitus with diabetic nephropathy: Secondary | ICD-10-CM

## 2020-04-08 DIAGNOSIS — Z23 Encounter for immunization: Secondary | ICD-10-CM

## 2020-04-08 DIAGNOSIS — E7800 Pure hypercholesterolemia, unspecified: Secondary | ICD-10-CM

## 2020-04-08 DIAGNOSIS — E1122 Type 2 diabetes mellitus with diabetic chronic kidney disease: Secondary | ICD-10-CM

## 2020-04-08 DIAGNOSIS — I10 Essential (primary) hypertension: Secondary | ICD-10-CM | POA: Diagnosis not present

## 2020-04-08 DIAGNOSIS — N1832 Chronic kidney disease, stage 3b: Secondary | ICD-10-CM

## 2020-04-08 LAB — KAPPA/LAMBDA LIGHT CHAINS
Kappa free light chain: 32.9 mg/L — ABNORMAL HIGH (ref 3.3–19.4)
Kappa, lambda light chain ratio: 0.22 — ABNORMAL LOW (ref 0.26–1.65)
Lambda free light chains: 152.9 mg/L — ABNORMAL HIGH (ref 5.7–26.3)

## 2020-04-08 LAB — IGG, IGA, IGM
IgA: 57 mg/dL — ABNORMAL LOW (ref 64–422)
IgG (Immunoglobin G), Serum: 812 mg/dL (ref 586–1602)
IgM (Immunoglobulin M), Srm: 206 mg/dL (ref 26–217)

## 2020-04-08 LAB — PROTEIN ELECTROPHORESIS, SERUM
A/G Ratio: 1.2 (ref 0.7–1.7)
Albumin ELP: 3.7 g/dL (ref 2.9–4.4)
Alpha-1-Globulin: 0.3 g/dL (ref 0.0–0.4)
Alpha-2-Globulin: 0.9 g/dL (ref 0.4–1.0)
Beta Globulin: 1 g/dL (ref 0.7–1.3)
Gamma Globulin: 0.9 g/dL (ref 0.4–1.8)
Globulin, Total: 3.1 g/dL (ref 2.2–3.9)
M-Spike, %: 0.2 g/dL — ABNORMAL HIGH
Total Protein ELP: 6.8 g/dL (ref 6.0–8.5)

## 2020-04-08 NOTE — Patient Instructions (Signed)

## 2020-04-09 ENCOUNTER — Ambulatory Visit
Admission: RE | Admit: 2020-04-09 | Discharge: 2020-04-09 | Disposition: A | Discharge status: Home / Self Care | Payer: Medicare Other | Source: Ambulatory Visit | Attending: Urology | Admitting: Urology

## 2020-04-09 ENCOUNTER — Other Ambulatory Visit: Payer: Self-pay

## 2020-04-09 DIAGNOSIS — R3129 Other microscopic hematuria: Secondary | ICD-10-CM | POA: Diagnosis present

## 2020-04-09 MED ORDER — IOHEXOL 300 MG/ML  SOLN
100.0000 mL | Freq: Once | INTRAMUSCULAR | Status: AC | PRN
  Administered 2020-04-09: 100 mL via INTRAVENOUS

## 2020-04-10 ENCOUNTER — Encounter: Payer: Self-pay | Admitting: Oncology

## 2020-04-10 ENCOUNTER — Other Ambulatory Visit: Payer: Self-pay | Admitting: *Deleted

## 2020-04-10 DIAGNOSIS — R9389 Abnormal findings on diagnostic imaging of other specified body structures: Secondary | ICD-10-CM

## 2020-04-11 ENCOUNTER — Inpatient Hospital Stay: Payer: Medicare Other | Attending: Oncology

## 2020-04-11 ENCOUNTER — Inpatient Hospital Stay (HOSPITAL_BASED_OUTPATIENT_CLINIC_OR_DEPARTMENT_OTHER): Payer: Medicare Other | Admitting: Oncology

## 2020-04-11 ENCOUNTER — Other Ambulatory Visit: Payer: Self-pay

## 2020-04-11 VITALS — BP 143/65 | HR 63 | Temp 96.3°F | Resp 20 | Wt 168.3 lb

## 2020-04-11 DIAGNOSIS — E119 Type 2 diabetes mellitus without complications: Secondary | ICD-10-CM | POA: Insufficient documentation

## 2020-04-11 DIAGNOSIS — N183 Chronic kidney disease, stage 3 unspecified: Secondary | ICD-10-CM | POA: Insufficient documentation

## 2020-04-11 DIAGNOSIS — Z9221 Personal history of antineoplastic chemotherapy: Secondary | ICD-10-CM | POA: Diagnosis not present

## 2020-04-11 DIAGNOSIS — D472 Monoclonal gammopathy: Secondary | ICD-10-CM | POA: Diagnosis not present

## 2020-04-11 DIAGNOSIS — I129 Hypertensive chronic kidney disease with stage 1 through stage 4 chronic kidney disease, or unspecified chronic kidney disease: Secondary | ICD-10-CM | POA: Insufficient documentation

## 2020-04-11 DIAGNOSIS — F418 Other specified anxiety disorders: Secondary | ICD-10-CM | POA: Diagnosis not present

## 2020-04-11 DIAGNOSIS — R978 Other abnormal tumor markers: Secondary | ICD-10-CM | POA: Insufficient documentation

## 2020-04-11 DIAGNOSIS — I700 Atherosclerosis of aorta: Secondary | ICD-10-CM | POA: Diagnosis not present

## 2020-04-11 DIAGNOSIS — Z853 Personal history of malignant neoplasm of breast: Secondary | ICD-10-CM | POA: Diagnosis not present

## 2020-04-11 DIAGNOSIS — Z79899 Other long term (current) drug therapy: Secondary | ICD-10-CM | POA: Diagnosis not present

## 2020-04-11 DIAGNOSIS — C259 Malignant neoplasm of pancreas, unspecified: Secondary | ICD-10-CM | POA: Insufficient documentation

## 2020-04-11 DIAGNOSIS — Z9012 Acquired absence of left breast and nipple: Secondary | ICD-10-CM | POA: Insufficient documentation

## 2020-04-11 DIAGNOSIS — Z7982 Long term (current) use of aspirin: Secondary | ICD-10-CM | POA: Diagnosis not present

## 2020-04-11 DIAGNOSIS — C786 Secondary malignant neoplasm of retroperitoneum and peritoneum: Secondary | ICD-10-CM | POA: Diagnosis not present

## 2020-04-11 DIAGNOSIS — M316 Other giant cell arteritis: Secondary | ICD-10-CM | POA: Insufficient documentation

## 2020-04-11 DIAGNOSIS — N1330 Unspecified hydronephrosis: Secondary | ICD-10-CM | POA: Insufficient documentation

## 2020-04-11 DIAGNOSIS — Z803 Family history of malignant neoplasm of breast: Secondary | ICD-10-CM | POA: Insufficient documentation

## 2020-04-11 DIAGNOSIS — K8689 Other specified diseases of pancreas: Secondary | ICD-10-CM | POA: Diagnosis not present

## 2020-04-11 DIAGNOSIS — R9389 Abnormal findings on diagnostic imaging of other specified body structures: Secondary | ICD-10-CM

## 2020-04-11 DIAGNOSIS — K219 Gastro-esophageal reflux disease without esophagitis: Secondary | ICD-10-CM | POA: Diagnosis not present

## 2020-04-11 DIAGNOSIS — Z85048 Personal history of other malignant neoplasm of rectum, rectosigmoid junction, and anus: Secondary | ICD-10-CM | POA: Diagnosis not present

## 2020-04-11 LAB — COMPREHENSIVE METABOLIC PANEL
ALT: 19 U/L (ref 0–44)
AST: 26 U/L (ref 15–41)
Albumin: 4 g/dL (ref 3.5–5.0)
Alkaline Phosphatase: 62 U/L (ref 38–126)
Anion gap: 11 (ref 5–15)
BUN: 22 mg/dL (ref 8–23)
CO2: 24 mmol/L (ref 22–32)
Calcium: 9.7 mg/dL (ref 8.9–10.3)
Chloride: 104 mmol/L (ref 98–111)
Creatinine, Ser: 1.51 mg/dL — ABNORMAL HIGH (ref 0.44–1.00)
GFR calc Af Amer: 36 mL/min — ABNORMAL LOW (ref >60)
GFR calc non Af Amer: 31 mL/min — ABNORMAL LOW (ref >60)
Glucose, Bld: 134 mg/dL — ABNORMAL HIGH (ref 70–99)
Potassium: 4 mmol/L (ref 3.5–5.1)
Sodium: 139 mmol/L (ref 135–145)
Total Bilirubin: 0.7 mg/dL (ref 0.3–1.2)
Total Protein: 7 g/dL (ref 6.5–8.1)

## 2020-04-11 NOTE — Addendum Note (Signed)
Addended by: Ruthell Rummage A on: 04/11/2020 11:07 AM   Modules accepted: Orders

## 2020-04-11 NOTE — Progress Notes (Signed)
[place]  [phone] [phone]) [phone]  ID: Veronica Colon OB: Oct 26, 1931  MR#: [id number]  HUD#:[id number]  Patient Care Team: Delsa Grana, PA-C as PCP - General (Family Medicine) Marlowe Sax, MD as Referring Physician (Internal Medicine) Birder Robson, MD as Referring Physician (Ophthalmology) Neldon Labella, RN as Case Manager  CHIEF COMPLAINT: Pancreatic mass, omental caking.  INTERVAL HISTORY: Patient returns to clinic today for further evaluation and discussion of her imaging results.  She had a CT scan ordered by nephrology for increasing renal insufficiency which revealed a pancreatic mass with omental caking.  She admits to some mild abdominal bloating and increased constipation, but otherwise feels well. He has no neurologic complaints.  She denies any recent fevers or illnesses.  She has a good appetite and denies weight loss.  She denies any pain.  She has no chest pain, shortness of breath, cough, or hemoptysis.  She denies any nausea, vomiting, or diarrhea.  She has noted no changes in her bowel movements and denies any melena or hematochezia.  She has no urinary complaints.  Patient offers no further specific complaints today.  REVIEW OF SYSTEMS:   Review of Systems  Constitutional: Negative.  Negative for fever, malaise/fatigue and weight loss.  Respiratory: Negative.  Negative for cough and hemoptysis.   Cardiovascular: Negative.  Negative for chest pain and leg swelling.  Gastrointestinal: Positive for constipation. Negative for abdominal pain, blood in stool and melena.  Genitourinary: Negative.  Negative for dysuria.  Musculoskeletal: Negative.  Negative for back pain.  Skin: Negative.  Negative for rash.  Neurological: Negative.  Negative for dizziness, focal weakness, weakness and headaches.  Psychiatric/Behavioral: Negative.  The patient is not nervous/anxious.     As per HPI. Otherwise, a complete review of  systems is negative.  PAST MEDICAL HISTORY: Past Medical History:  Diagnosis Date   Allergy    Anxiety and depression    Anxiety and depression    Arthritis    Breast cancer ([place]) 1990   LT MASTECTOMY   Cancer ([place]) 1990   LT BREAST MASTECTOMY   CKD (chronic kidney disease), stage III    Diabetes mellitus without complication ([place])    GERD (gastroesophageal reflux disease)    Hypertension    Hypertensive CKD (chronic kidney disease)    Personal history of chemotherapy    Status post chemotherapy    breast cancer   Temporal arteritis ([place]) 10/28/2016   Overview:  Right temporal arteritis  Biopsy proven 11/08/2016    PAST SURGICAL HISTORY: Past Surgical History:  Procedure Laterality Date   ABDOMINAL HYSTERECTOMY     BREAST SURGERY     COLONOSCOPY WITH PROPOFOL N/A 06/04/2015   Procedure: COLONOSCOPY WITH PROPOFOL;  Surgeon: Manya Silvas, MD;  Location: [place];  Service: Endoscopy;  Laterality: N/A;   MASTECTOMY Left 1990   BREAST CA   TEMPORAL ARTERY BIOPSY / LIGATION  11/2016    FAMILY HISTORY: Family History  Problem Relation Age of Onset   Breast cancer Mother 52   Cancer Mother        breast   Cancer Father        lung    ADVANCED DIRECTIVES (Y/N):  N  HEALTH MAINTENANCE: Social History   Tobacco Use   Smoking status: Never Smoker   Smokeless tobacco: Never Used  Vaping Use   Vaping Use: Never used  Substance Use Topics   Alcohol use: No    Alcohol/week: 0.0 standard drinks   Drug use:  No     Colonoscopy:  PAP:  Bone density:  Lipid panel:  Allergies  Allergen Reactions   Hydrochlorothiazide Other (See Comments)    Hypokalemia    Lisinopril Other (See Comments) and Cough    Hands numb    Metformin And Related     Fatigue, "heart was beating slower"    Current Outpatient Medications  Medication Sig Dispense Refill   ascorbic acid (VITAMIN C) 1000 MG tablet Take by mouth.     aspirin 81 MG  tablet Take 81 mg by mouth daily.     fluticasone (FLONASE) 50 MCG/ACT nasal spray Place 2 sprays into both nostrils daily. 48 g 3   Multiple Vitamin (MULTIVITAMIN) tablet Take 1 tablet by mouth daily.     OS-CAL CALCIUM + D3 500-200 MG-UNIT TABS Take 1 tablet by mouth 2 (two) times daily with a meal.  11   spironolactone (ALDACTONE) 50 MG tablet TAKE 1 TABLET BY MOUTH EVERY DAY 90 tablet 3   No current facility-administered medications for this visit.    OBJECTIVE: Vitals:   04/10/20 1344  BP: (!) 143/65  Pulse: 63  Resp: 20  Temp: (!) 96.3 F (35.7 C)  SpO2: 100%     Body mass index is 28.89 kg/m.    ECOG FS:0 - Asymptomatic  General: Well-developed, well-nourished, no acute distress. Eyes: Pink conjunctiva, anicteric sclera. HEENT: Normocephalic, moist mucous membranes. Lungs: No audible wheezing or coughing. Heart: Regular rate and rhythm. Abdomen: Soft, nontender, no obvious distention. Musculoskeletal: No edema, cyanosis, or clubbing. Neuro: Alert, answering all questions appropriately. Cranial nerves grossly intact. Skin: No rashes or petechiae noted. Psych: Normal affect.  LAB RESULTS:  Lab Results  Component Value Date   NA 139 04/11/2020   K 4.0 04/11/2020   CL 104 04/11/2020   CO2 24 04/11/2020   GLUCOSE 134 (H) 04/11/2020   BUN 22 04/11/2020   CREATININE 1.51 (H) 04/11/2020   CALCIUM 9.7 04/11/2020   PROT 7.0 04/11/2020   ALBUMIN 4.0 04/11/2020   AST 26 04/11/2020   ALT 19 04/11/2020   ALKPHOS 62 04/11/2020   BILITOT 0.7 04/11/2020   GFRNONAA 31 (L) 04/11/2020   GFRAA 36 (L) 04/11/2020    Lab Results  Component Value Date   WBC 6.0 04/07/2020   NEUTROABS 4.1 04/07/2020   HGB 12.4 04/07/2020   HCT 38.0 04/07/2020   MCV 84.8 04/07/2020   PLT 174 04/07/2020     STUDIES: US RENAL  Result Date: 03/19/2020 CLINICAL DATA:  42 year old with acute kidney injury. EXAM: RENAL / URINARY TRACT ULTRASOUND COMPLETE COMPARISON:  Remote abdomen pelvis  CT 07/12/2011 FINDINGS: Right Kidney: Renal measurements: 10.4 x 4.7 x 5.2 cm = volume: 133 mL. There is mild thinning of the renal parenchyma. Echogenicity within normal limits. No mass or hydronephrosis visualized. Left Kidney: Renal measurements: 11.3 x 6.1 x 4.5 cm = volume: 161 mL. There is mild hydronephrosis. 9 mm echogenic focus in the lower left kidney has no posterior shadowing. The upper ureter is dilated. No cause for obstruction visualized by ultrasound. Hydronephrosis persisted postvoid. Minimal thinning of the renal parenchyma. No evidence of focal lesion. Bladder: Only minimally distended. The right ureteral jet was visualized. The left ureteral jet is not seen. Other: None. IMPRESSION: 1. Mild left hydronephrosis and hydroureter. No cause for obstruction visualized by ultrasound. The left ureteral jet is not seen. Consider further evaluation with CT to assess for potential etiology of obstruction. 2. An echogenic focus in the lower left  kidney has no posterior shadowing and is equivalent for nonobstructing renal stone. 3. Mild bilateral renal parenchymal thinning. These results will be called to the ordering clinician or representative by the Radiologist Assistant, and communication documented in the PACS or [employer]. Electronically Signed   By: Keith Rake M.D.   On: 03/19/2020 15:41   CT HEMATURIA WORKUP  Result Date: 04/09/2020 CLINICAL DATA:  Microscopic hematuria, history of LEFT breast cancer and colon cancer by report. EXAM: CT ABDOMEN AND PELVIS WITHOUT AND WITH CONTRAST TECHNIQUE: Multidetector CT imaging of the abdomen and pelvis was performed following the standard protocol before and following the bolus administration of intravenous contrast. CONTRAST:  177mL OMNIPAQUE IOHEXOL 300 MG/ML  SOLN COMPARISON:  CT of the chest abdomen and pelvis from 20/12, no recent comparisons are available FINDINGS: Lower chest: Lung bases are clear.  No effusion.  No consolidation.  Hepatobiliary: Liver without focal lesion. The trace ascites in the setting of peritoneal carcinomatosis. Mildly nodular appearance of the RIGHT posterior hepatic margin in more since pouch. No pericholecystic stranding. No biliary duct dilation. Pancreas: Mass in the pancreas measuring 5 x 2.7 cm with peripheral ductal dilation and parenchymal atrophy. Soft tissue occluding the SMV. Main portal vein while patent into the liver is surrounded by areas of cavernous transformation likely reconstituted flow in the setting of cavernous transformation about the pancreatic head. Splenic vein is occluded. Celiac axis is case. Soft tissue involvement of the LEFT adrenal. Signs of diffuse omental and peritoneal carcinomatosis. For instance on image 37 of series 4 is a 4.2 x 1.9 cm area peritoneal nodularity. Small volume ascites. Omental nodularity and nodularity extending through a fat containing umbilical hernia best seen on image 42 of series 4 where there is a 1 cm nodule in the body wall with adjacent omental caking on image 43 measuring 4.3 x 1.4 cm. Spleen: Spleen normal in size and contour. Adrenals/Urinary Tract: LEFT adrenal with soft tissue. LEFT sided hydronephrosis due to obstructing soft tissue mass in the LEFT retroperitoneum measuring 2.0 x 1.6 cm involving the mid LEFT ureter. Delayed enhancement of the LEFT kidney. Normal enhancement of the RIGHT kidney. Cortical loss on the LEFT implies some chronicity with respect to obstruction. Stomach/Bowel: Distal esophageal and gastric thickening with mural stratification of the gastric wall. Nodular thickening of the gastric fundus compatible with gastric involvement best seen on image 38 of series 10 the collateral pathways surround the stomach in the setting of SMV occlusion and splenic vein occlusion. Small bowel without signs of obstruction. Implants in the mesentery of the small bowel best seen on image 26 of series 10, coronal data set in the jejunal mesentery  where there is a 14 mm mesenteric implant. Nodular area in the RIGHT lower quadrant small bowel mesentery approximately 8 mm adjacent to ileal loops (image 46 of series 4. Signs of previous RIGHT colectomy.  No acute colonic process Vascular/Lymphatic: Calcified and noncalcified atheromatous plaque of the abdominal aorta. No aneurysmal dilation no discrete adenopathy with LEFT retroperitoneal soft tissue obstructing LEFT ureter as outlined above. No pelvic sidewall lymphadenopathy. Reproductive: Small volume pelvic ascites. Heterogeneity of the bilateral adnexa without discrete mass lesion. Ascites tracks into the pelvis with evidence of fascial thickening presumably related to peritoneal disease. Other: Peritoneal disease as discussed. Musculoskeletal: Subtle lucency at T12 measuring approximately cm just to the RIGHT of midline likely a metastatic focus. Spinal degenerative changes. No additional suspicious bone lesion. Excretory phase imaging limited on the LEFT due to obstruction of  the mid LEFT ureter. The RIGHT-sided excretion is limited, dilute but without discrete focus of abnormality. IMPRESSION: 1. 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 Suspicious for primary pancreatic neoplasm, confounded by the presence of potential linitd plastica and gastric thickening in the fundus. Metastatic disease from breast cancer could account for these findings as well. Endoscopic assessment and biopsy is suggested. 2. Vascular encasement in the setting of pancreatic mass occluding splenic vein, SMV and encasing celiac axis with resultant collateral pathways in the upper abdomen as discussed. 3. LEFT sided hydronephrosis due to obstructing soft tissue mass in the LEFT retroperitoneum measuring 2.0 x 1.6 cm involving the mid LEFT ureter. This results in delayed enhancement and excretion of the LEFT kidney and with cortical thinning that is compatible with a chronic process. 4. Subtle lucency at T12  measuring approximately 1 cm just to the RIGHT of midline likely a metastatic focus. 5. Aortic atherosclerosis. These results will be called to the ordering clinician or representative by the Radiologist Assistant, and communication documented in the PACS or [employer]. Aortic Atherosclerosis (ICD10-I70.0). Electronically Signed   By: Zetta Bills M.D.   On: 04/09/2020 15:59    ASSESSMENT: Pancreatic mass, omental caking.  PLAN:   1.  Pancreatic mass/omental caking: Highly suspicious for underlying malignancy.  Tumor markers were drawn and are pending at time of dictation.  Will order CT-guided biopsy as well as PET scan to obtain a diagnosis and complete the staging work-up.  Patient expressed interest in systemic treatment.  Return to clinic 1 week after biopsy to discuss the results and treatment planning. 2.  MGUS: Patient's most recent M spike is only 0.2.  This is likely clinically insignificant.  No further work-up is necessary. 3.  History of colon cancer: Patient had stage I colon cancer 7 or 8 years ago.  This is unlikely recurrence, but will pursue biopsy as above. 4.  History of breast cancer: 31 years ago.  Unclear stage or type of malignancy. 5.  Renal insufficiency: Likely secondary to obstruction from omental caking.  Monitor.     Patient expressed understanding and was in agreement with this plan. She also understands that She can call clinic at any time with any questions, concerns, or complaints.    Lloyd Huger, MD   04/11/2020 10:26 AM

## 2020-04-12 LAB — CEA: CEA: 3.4 ng/mL (ref 0.0–4.7)

## 2020-04-12 LAB — CA 125: Cancer Antigen (CA) 125: 90.1 U/mL — ABNORMAL HIGH (ref 0.0–38.1)

## 2020-04-12 LAB — CANCER ANTIGEN 19-9: CA 19-9: 19661 U/mL — ABNORMAL HIGH (ref 0–35)

## 2020-04-12 LAB — CANCER ANTIGEN 27.29: CA 27.29: 65.1 U/mL — ABNORMAL HIGH (ref 0.0–38.6)

## 2020-04-16 ENCOUNTER — Telehealth: Payer: Self-pay

## 2020-04-16 NOTE — Telephone Encounter (Signed)
Patient and daughter called and notified that CT biopsy has been scheduled on 04/24/2020 appt at 46 but to arrive at 10 am. Also informed a rad nurse would be calling to go over appt with them. They verbalized understanding and denied questions.

## 2020-04-17 ENCOUNTER — Ambulatory Visit
Admission: RE | Admit: 2020-04-17 | Discharge: 2020-04-17 | Disposition: A | Discharge status: Home / Self Care | Payer: Medicare Other | Source: Ambulatory Visit | Attending: Oncology | Admitting: Oncology

## 2020-04-17 ENCOUNTER — Other Ambulatory Visit: Payer: Self-pay

## 2020-04-17 ENCOUNTER — Ambulatory Visit: Payer: Self-pay | Admitting: Urology

## 2020-04-17 DIAGNOSIS — K8689 Other specified diseases of pancreas: Secondary | ICD-10-CM

## 2020-04-17 LAB — GLUCOSE, CAPILLARY: Glucose-Capillary: 89 mg/dL (ref 70–99)

## 2020-04-17 MED ORDER — FLUDEOXYGLUCOSE F - 18 (FDG) INJECTION
8.7000 | Freq: Once | INTRAVENOUS | Status: AC | PRN
  Administered 2020-04-17: 9.55 via INTRAVENOUS

## 2020-04-18 ENCOUNTER — Ambulatory Visit: Payer: Medicare Other | Admitting: Oncology

## 2020-04-21 NOTE — Progress Notes (Signed)
Patient on schedule for Omental biopsy 04/24/2020. Spoke with patient's daughter on phone with pre procedure instructions given. Made aware to be here @ 1000, NPO after MN, as well as driver for discharge after procedure. Stated understanding.

## 2020-04-23 ENCOUNTER — Other Ambulatory Visit: Payer: Self-pay | Admitting: Student

## 2020-04-24 ENCOUNTER — Ambulatory Visit
Admission: RE | Admit: 2020-04-24 | Discharge: 2020-04-24 | Disposition: A | Discharge status: Home / Self Care | Payer: Medicare Other | Source: Ambulatory Visit | Attending: Oncology | Admitting: Oncology

## 2020-04-24 ENCOUNTER — Other Ambulatory Visit: Payer: Self-pay

## 2020-04-24 DIAGNOSIS — Z7982 Long term (current) use of aspirin: Secondary | ICD-10-CM | POA: Insufficient documentation

## 2020-04-24 DIAGNOSIS — N183 Chronic kidney disease, stage 3 unspecified: Secondary | ICD-10-CM | POA: Insufficient documentation

## 2020-04-24 DIAGNOSIS — E1122 Type 2 diabetes mellitus with diabetic chronic kidney disease: Secondary | ICD-10-CM | POA: Insufficient documentation

## 2020-04-24 DIAGNOSIS — K668 Other specified disorders of peritoneum: Secondary | ICD-10-CM | POA: Insufficient documentation

## 2020-04-24 DIAGNOSIS — K869 Disease of pancreas, unspecified: Secondary | ICD-10-CM | POA: Diagnosis not present

## 2020-04-24 DIAGNOSIS — I129 Hypertensive chronic kidney disease with stage 1 through stage 4 chronic kidney disease, or unspecified chronic kidney disease: Secondary | ICD-10-CM | POA: Insufficient documentation

## 2020-04-24 DIAGNOSIS — K8689 Other specified diseases of pancreas: Secondary | ICD-10-CM

## 2020-04-24 DIAGNOSIS — Z79899 Other long term (current) drug therapy: Secondary | ICD-10-CM | POA: Insufficient documentation

## 2020-04-24 LAB — CBC
HCT: 39.2 % (ref 36.0–46.0)
Hemoglobin: 12.5 g/dL (ref 12.0–15.0)
MCH: 27.7 pg (ref 26.0–34.0)
MCHC: 31.9 g/dL (ref 30.0–36.0)
MCV: 86.7 fL (ref 80.0–100.0)
Platelets: 174 10*3/uL (ref 150–400)
RBC: 4.52 MIL/uL (ref 3.87–5.11)
RDW: 14.1 % (ref 11.5–15.5)
WBC: 6.3 10*3/uL (ref 4.0–10.5)
nRBC: 0 % (ref 0.0–0.2)

## 2020-04-24 LAB — GLUCOSE, CAPILLARY: Glucose-Capillary: 113 mg/dL — ABNORMAL HIGH (ref 70–99)

## 2020-04-24 LAB — PROTIME-INR
INR: 1 (ref 0.8–1.2)
Prothrombin Time: 13 seconds (ref 11.4–15.2)

## 2020-04-24 MED ORDER — MIDAZOLAM HCL 2 MG/2ML IJ SOLN
INTRAMUSCULAR | Status: AC | PRN
  Administered 2020-04-24: 1 mg via INTRAVENOUS

## 2020-04-24 MED ORDER — FENTANYL CITRATE (PF) 100 MCG/2ML IJ SOLN
INTRAMUSCULAR | Status: AC
  Filled 2020-04-24: qty 2

## 2020-04-24 MED ORDER — FENTANYL CITRATE (PF) 100 MCG/2ML IJ SOLN
INTRAMUSCULAR | Status: AC | PRN
  Administered 2020-04-24: 50 ug via INTRAVENOUS

## 2020-04-24 MED ORDER — MIDAZOLAM HCL 2 MG/2ML IJ SOLN
INTRAMUSCULAR | Status: AC
  Filled 2020-04-24: qty 2

## 2020-04-24 MED ORDER — SODIUM CHLORIDE 0.9 % IV SOLN: INTRAVENOUS | Status: DC

## 2020-04-24 NOTE — Discharge Instructions (Signed)
Needle Biopsy, Care After This sheet gives you information about how to care for yourself after your procedure. Your health care provider may also give you more specific instructions. If you have problems or questions, contact your health care provider. What can I expect after the procedure? After the procedure, it is common to have soreness, bruising, or mild pain at the puncture site. This should go away in a few days. Follow these instructions at home: Needle insertion site care   Wash your hands with soap and water before you change your bandage (dressing). If you cannot use soap and water, use hand sanitizer.  Follow instructions from your health care provider about how to take care of your puncture site. This includes: ? When and how to change your dressing. ? When to remove your dressing.  Check your puncture site every day for signs of infection. Check for: ? Redness, swelling, or pain. ? Fluid or blood. ? Pus or a bad smell. ? Warmth. General instructions  Return to your normal activities as told by your health care provider. Ask your health care provider what activities are safe for you.  Do not take baths, swim, or use a hot tub until your health care provider approves. Ask your health care provider if you may take showers. You may only be allowed to take sponge baths.  Take over-the-counter and prescription medicines only as told by your health care provider.  Keep all follow-up visits as told by your health care provider. This is important. Contact a health care provider if:  You have a fever.  You have redness, swelling, or pain at the puncture site that lasts longer than a few days.  You have fluid, blood, or pus coming from your puncture site.  Your puncture site feels warm to the touch. Get help right away if:  You have severe bleeding from the puncture site. Summary  After the procedure, it is common to have soreness, bruising, or mild pain at the puncture  site. This should go away in a few days.  Check your puncture site every day for signs of infection, such as redness, swelling, or pain.  Get help right away if you have severe bleeding from your puncture site. This information is not intended to replace advice given to you by your health care provider. Make sure you discuss any questions you have with your health care provider. Document Revised: 09/09/2017 Document Reviewed: 07/11/2017 Elsevier Patient Education  2020 [employer]  

## 2020-04-24 NOTE — Procedures (Signed)
Interventional Radiology Procedure Note  Procedure: CT guided omental mass biopsy  Findings: Please refer to procedural dictation for full description. 18 ga core x 4 of left anterior omental mass.  Samples placed in formalin and sent to Pathology.  Complications: None immediate  Estimated Blood Loss: <5 mL  Recommendations: Bedrest for 2 hours, then discharge home. Follow up Pathology results.   Ruthann Cancer, MD Pager: [phone]

## 2020-04-24 NOTE — Progress Notes (Signed)
Patient clinically stable post Omental biopsy per Dr Serafina Royals. Tolerated well with vitals stable. Awake/alert and oriented post procedure. Denies complaints at this time. Received Versed 1 mg along with Fentanyl 50 mcg IV for procedure.report given to Carlynn Spry RN post procedure.

## 2020-04-24 NOTE — H&P (Signed)
Chief Complaint: Patient was seen in consultation today for omental biopsy at the request of Finnegan,Timothy J  Referring Physician(s): Finnegan,Timothy J  Patient Status: [facility] - Out-pt  History of Present Illness: Veronica Colon is a 86 y.o. female with history of pancreatic mass and omental thickening with concern for underlying malignancy.  She presents today biopsy of the omental mass.  She is feeling well with no specific complaints today.  Past Medical History:  Diagnosis Date  . Allergy   . Anxiety and depression   . Anxiety and depression   . Arthritis   . Breast cancer ([place]) 1990   LT MASTECTOMY  . Cancer ([place]) 1990   LT BREAST MASTECTOMY  . CKD (chronic kidney disease), stage III ([place])   . Diabetes mellitus without complication ([place])   . GERD (gastroesophageal reflux disease)   . Hypertension   . Hypertensive CKD (chronic kidney disease)   . Personal history of chemotherapy   . Status post chemotherapy    breast cancer  . Temporal arteritis ([place]) 10/28/2016   Overview:  Right temporal arteritis  Biopsy proven 11/08/2016    Past Surgical History:  Procedure Laterality Date  . ABDOMINAL HYSTERECTOMY    . BREAST SURGERY    . COLONOSCOPY WITH PROPOFOL N/A 06/04/2015   Procedure: COLONOSCOPY WITH PROPOFOL;  Surgeon: Manya Silvas, MD;  Location: [facility] ENDOSCOPY;  Service: Endoscopy;  Laterality: N/A;  . MASTECTOMY Left 1990   BREAST CA  . TEMPORAL ARTERY BIOPSY / LIGATION  11/2016    Allergies: Hydrochlorothiazide, Lisinopril, and Metformin and related  Medications: Prior to Admission medications   Medication Sig Start Date End Date Taking? Authorizing Provider  ascorbic acid (VITAMIN C) 1000 MG tablet Take by mouth.   Yes [provider]  aspirin 81 MG tablet Take 81 mg by mouth daily.   Yes [provider]  fluticasone (FLONASE) 50 MCG/ACT nasal spray Place 2 sprays into both nostrils daily. 08/01/18  Yes Lada, Satira Anis, MD    Multiple Vitamin (MULTIVITAMIN) tablet Take 1 tablet by mouth daily.   Yes [provider]  OS-CAL CALCIUM + D3 500-200 MG-UNIT TABS Take 1 tablet by mouth 2 (two) times daily with a meal. 10/25/17  Yes [provider]  spironolactone (ALDACTONE) 50 MG tablet TAKE 1 TABLET BY MOUTH EVERY DAY 12/06/19  Yes Delsa Grana, PA-C     Family History  Problem Relation Age of Onset  . Breast cancer Mother 82  . Cancer Mother        breast  . Cancer Father        lung    Social History   Socioeconomic History  . Marital status: Widowed    Spouse name: Not on file  . Number of children: 1  . Years of education: 68  . Highest education level: Not on file  Occupational History  . Occupation: retired  Tobacco Use  . Smoking status: Never Smoker  . Smokeless tobacco: Never Used  Vaping Use  . Vaping Use: Never used  Substance and Sexual Activity  . Alcohol use: No    Alcohol/week: 0.0 standard drinks  . Drug use: No  . Sexual activity: Not Currently  Other Topics Concern  . Not on file  Social History Narrative   Pt lives with her daughter   Social Determinants of Health   Financial Resource Strain: Low Risk   . Difficulty of Paying Living Expenses: Not hard at all  Food Insecurity: No Food  Insecurity  . Worried About Charity fundraiser in the Last Year: Never true  . Ran Out of Food in the Last Year: Never true  Transportation Needs: No Transportation Needs  . Lack of Transportation (Medical): No  . Lack of Transportation (Non-Medical): No  Physical Activity: Inactive  . Days of Exercise per Week: 0 days  . Minutes of Exercise per Session: 0 min  Stress: No Stress Concern Present  . Feeling of Stress : Not at all  Social Connections: Moderately Integrated  . Frequency of Communication with Friends and Family: More than three times a week  . Frequency of Social Gatherings with Friends and Family: Twice a week  . Attends Religious Services: More than 4  times per year  . Active Member of Clubs or Organizations: Yes  . Attends Archivist Meetings: More than 4 times per year  . Marital Status: Divorced    Review of Systems: A 12 point ROS discussed and pertinent positives are indicated in the HPI above.  All other systems are negative.   Vital Signs: BP (!) 147/62   Pulse (!) 56   Temp 98.4 F (36.9 C) (Oral)   Resp 16   Ht 5\' 4"  (1.626 m)   Wt 75.8 kg   LMP  (LMP Unknown)   SpO2 96%   BMI 28.67 kg/m   Physical Exam Constitutional:      Appearance: Normal appearance.  HENT:     Head: Normocephalic.     Mouth/Throat:     Mouth: Mucous membranes are moist.     Comments: MP2  Cardiovascular:     Rate and Rhythm: Normal rate and regular rhythm.     Heart sounds: Normal heart sounds.  Pulmonary:     Effort: Pulmonary effort is normal.     Breath sounds: Normal breath sounds.  Abdominal:     General: There is no distension.     Tenderness: There is no abdominal tenderness.  Musculoskeletal:        General: No swelling.  Skin:    General: Skin is warm and dry.  Neurological:     Mental Status: She is alert and oriented to person, place, and time.     Imaging: NM PET Image Initial (PI) Skull Base To Thigh  Result Date: 04/17/2020 CLINICAL DATA:  Initial treatment strategy for pancreatic mass. EXAM: NUCLEAR MEDICINE PET SKULL BASE TO THIGH TECHNIQUE: 9.6 mCi F-18 FDG was injected intravenously. Full-ring PET imaging was performed from the skull base to thigh after the radiotracer. CT data was obtained and used for attenuation correction and anatomic localization. Fasting blood glucose: 89 mg/dl COMPARISON:  Multiple exams, including CT abdomen from 04/09/2020 FINDINGS: Mediastinal blood pool activity: SUV max 3.5 Liver activity: SUV max NA NECK: No significant abnormal hypermetabolic activity in this region. Incidental CT findings: Mild left common carotid atherosclerotic calcification. CHEST: No significant  abnormal hypermetabolic activity in this region. Incidental CT findings: Coronary, aortic arch, and branch vessel atherosclerotic vascular disease. Mild cardiomegaly. Left mastectomy. ABDOMEN/PELVIS: Infiltrative mass of the pancreatic tail and body shown on recent CT has a maximum SUV of 7.7, high suspicion for pancreatic adenocarcinoma. Dilated dorsal pancreatic duct distal to the mass. Nondistention of the stomach probably accounts for the wall thickening in the stomach wall proximally given the lack of associated significant hypermetabolic activity. Hypermetabolic omental caking of tumor. A representative region of involvement in the left abdomen has a maximum SUV of 8.8. Left para-aortic tumor just anterior to  the left ureter is likely causing some extrinsic compression of the ureter. This measures 2.6 by 1.3 cm and has a maximum SUV of about 6.6. Incidental CT findings: Mild left hydronephrosis and proximal hydroureter. Aortoiliac atherosclerotic vascular disease. Small amount of pelvic ascites, possibly malignant. SKELETON: No significant abnormal hypermetabolic activity in this region. A 0.8 cm lucent lesion in the T12 vertebral body does not have appreciable associated hypermetabolic activity, and was present on 07/12/2011, hence is likely a small hemangioma or similar benign lesion. Incidental CT findings: Grade 2 anterolisthesis at L4-5 due to prominent degenerative facet arthropathy. Given the degree of facet arthropathy, I suspect that the low-grade activity in the right facet joint is due to arthropathy rather than malignancy. Thoracic spondylosis. IMPRESSION: 1. Hypermetabolic pancreatic mass favoring pancreatic adenocarcinoma, with hypermetabolic omental caking of tumor and a small amount of pelvic ascites which may be malignant. 2. No definite hypermetabolic activity in the stomach wall, wall thickening is probably from nondistention. 3. Hypermetabolic focus of tumor to the left of the aorta  adjacent to the left ureter is causing left hydronephrosis and hydroureter, potentially from extrinsic compression or ureteral invasion. 4. Other imaging findings of potential clinical significance: Aortic Atherosclerosis (ICD10-I70.0). Prominent facet arthropathy at L4-5 with grade 2 anterolisthesis. Coronary atherosclerosis. Mild cardiomegaly. Electronically Signed   By: Van Clines M.D.   On: 04/17/2020 16:53   CT HEMATURIA WORKUP  Result Date: 04/09/2020 CLINICAL DATA:  Microscopic hematuria, history of LEFT breast cancer and colon cancer by report. EXAM: CT ABDOMEN AND PELVIS WITHOUT AND WITH CONTRAST TECHNIQUE: Multidetector CT imaging of the abdomen and pelvis was performed following the standard protocol before and following the bolus administration of intravenous contrast. CONTRAST:  163mL OMNIPAQUE IOHEXOL 300 MG/ML  SOLN COMPARISON:  CT of the chest abdomen and pelvis from 20/12, no recent comparisons are available FINDINGS: Lower chest: Lung bases are clear.  No effusion.  No consolidation. Hepatobiliary: Liver without focal lesion. The trace ascites in the setting of peritoneal carcinomatosis. Mildly nodular appearance of the RIGHT posterior hepatic margin in more since pouch. No pericholecystic stranding. No biliary duct dilation. Pancreas: Mass in the pancreas measuring 5 x 2.7 cm with peripheral ductal dilation and parenchymal atrophy. Soft tissue occluding the SMV. Main portal vein while patent into the liver is surrounded by areas of cavernous transformation likely reconstituted flow in the setting of cavernous transformation about the pancreatic head. Splenic vein is occluded. Celiac axis is case. Soft tissue involvement of the LEFT adrenal. Signs of diffuse omental and peritoneal carcinomatosis. For instance on image 37 of series 4 is a 4.2 x 1.9 cm area peritoneal nodularity. Small volume ascites. Omental nodularity and nodularity extending through a fat containing umbilical hernia  best seen on image 42 of series 4 where there is a 1 cm nodule in the body wall with adjacent omental caking on image 43 measuring 4.3 x 1.4 cm. Spleen: Spleen normal in size and contour. Adrenals/Urinary Tract: LEFT adrenal with soft tissue. LEFT sided hydronephrosis due to obstructing soft tissue mass in the LEFT retroperitoneum measuring 2.0 x 1.6 cm involving the mid LEFT ureter. Delayed enhancement of the LEFT kidney. Normal enhancement of the RIGHT kidney. Cortical loss on the LEFT implies some chronicity with respect to obstruction. Stomach/Bowel: Distal esophageal and gastric thickening with mural stratification of the gastric wall. Nodular thickening of the gastric fundus compatible with gastric involvement best seen on image 38 of series 10 the collateral pathways surround the stomach in the setting  of SMV occlusion and splenic vein occlusion. Small bowel without signs of obstruction. Implants in the mesentery of the small bowel best seen on image 26 of series 10, coronal data set in the jejunal mesentery where there is a 14 mm mesenteric implant. Nodular area in the RIGHT lower quadrant small bowel mesentery approximately 8 mm adjacent to ileal loops (image 46 of series 4. Signs of previous RIGHT colectomy.  No acute colonic process Vascular/Lymphatic: Calcified and noncalcified atheromatous plaque of the abdominal aorta. No aneurysmal dilation no discrete adenopathy with LEFT retroperitoneal soft tissue obstructing LEFT ureter as outlined above. No pelvic sidewall lymphadenopathy. Reproductive: Small volume pelvic ascites. Heterogeneity of the bilateral adnexa without discrete mass lesion. Ascites tracks into the pelvis with evidence of fascial thickening presumably related to peritoneal disease. Other: Peritoneal disease as discussed. Musculoskeletal: Subtle lucency at T12 measuring approximately cm just to the RIGHT of midline likely a metastatic focus. Spinal degenerative changes. No additional  suspicious bone lesion. Excretory phase imaging limited on the LEFT due to obstruction of the mid LEFT ureter. The RIGHT-sided excretion is limited, dilute but without discrete focus of abnormality. IMPRESSION: 1. 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 Suspicious for primary pancreatic neoplasm, confounded by the presence of potential linitd plastica and gastric thickening in the fundus. Metastatic disease from breast cancer could account for these findings as well. Endoscopic assessment and biopsy is suggested. 2. Vascular encasement in the setting of pancreatic mass occluding splenic vein, SMV and encasing celiac axis with resultant collateral pathways in the upper abdomen as discussed. 3. LEFT sided hydronephrosis due to obstructing soft tissue mass in the LEFT retroperitoneum measuring 2.0 x 1.6 cm involving the mid LEFT ureter. This results in delayed enhancement and excretion of the LEFT kidney and with cortical thinning that is compatible with a chronic process. 4. Subtle lucency at T12 measuring approximately 1 cm just to the RIGHT of midline likely a metastatic focus. 5. Aortic atherosclerosis. These results will be called to the ordering clinician or representative by the Radiologist Assistant, and communication documented in the PACS or [employer]. Aortic Atherosclerosis (ICD10-I70.0). Electronically Signed   By: Zetta Bills M.D.   On: 04/09/2020 15:59    Labs:  CBC: Recent Labs    11/17/19 1407 04/07/20 1221 04/24/20 1031  WBC 8.1 6.0 6.3  HGB 14.0 12.4 12.5  HCT 43.1 38.0 39.2  PLT 192 174 174    COAGS: Recent Labs    04/24/20 1031  INR 1.0    BMP: Recent Labs    01/07/20 0938 03/24/20 1008 04/07/20 1221 04/11/20 0858  NA 139 140 137 139  K 4.3 3.9 4.1 4.0  CL 105 102 102 104  CO2 25 21 25 24   GLUCOSE 112* 124* 107* 134*  BUN 28* 21 23 22   CALCIUM 9.9 9.7 9.9 9.7  CREATININE 1.74* 1.45* 1.73* 1.51*  GFRNONAA 26* 33* 26* 31*   GFRAA 30* 38* 30* 36*    LIVER FUNCTION TESTS: Recent Labs    05/25/19 0000 01/07/20 0938 04/11/20 0858  BILITOT 0.7 0.4 0.7  AST 23 25 26   ALT 19 18 19   ALKPHOS  --   --  62  PROT 5.7* 6.3 7.0  ALBUMIN  --   --  4.0    TUMOR MARKERS: No results for input(s): AFPTM, CEA, CA199, CHROMGRNA in the last 8760 hours.  Assessment and Plan: 86 year old female with recently diagnosed pancreatic and omental masses.  Plan for CT guided  biopsy of omental mass in left abdomen with moderate sedation.  Thank you for this interesting consult.  I greatly enjoyed meeting Catori Panozzo Delaney and look forward to participating in their care.  A copy of this report was sent to the requesting provider on this date.  Electronically Signed: Suzette Battiest, MD 04/24/2020, 11:07 AM   I spent a total of15 Minutes in face to face in clinical consultation, greater than 50% of which was counseling/coordinating care for biopsy of omental mass.

## 2020-04-25 NOTE — Progress Notes (Signed)
[place]  Telephone:([phone]) [phone] Fax:(336) [phone]  ID: Veronica Colon OB: [birth year]  MR#: [id number]  OEV#:[id number]  Patient Care Team: Delsa Grana, PA-C as PCP - General (Family Medicine) Marlowe Sax, MD as Referring Physician (Internal Medicine) Birder Robson, MD as Referring Physician (Ophthalmology)  CHIEF COMPLAINT: Stage IV pancreatic cancer.  INTERVAL HISTORY: Patient returns to clinic today for further evaluation, discussion of her biopsy results, and treatment planning.  She continues to have mild abdominal bloating and discomfort, but otherwise feels well. He has no neurologic complaints.  She denies any recent fevers or illnesses.  She has a good appetite and denies weight loss.  She denies any pain.  She has no chest pain, shortness of breath, cough, or hemoptysis.  She denies any nausea, vomiting, or diarrhea.  She has noted no changes in her bowel movements and denies any melena or hematochezia.  She has no urinary complaints.  Patient offers no further specific complaints today.  REVIEW OF SYSTEMS:   Review of Systems  Constitutional: Negative.  Negative for fever, malaise/fatigue and weight loss.  Respiratory: Negative.  Negative for cough and hemoptysis.   Cardiovascular: Negative.  Negative for chest pain and leg swelling.  Gastrointestinal: Positive for constipation. Negative for abdominal pain, blood in stool and melena.  Genitourinary: Negative.  Negative for dysuria.  Musculoskeletal: Negative.  Negative for back pain.  Skin: Negative.  Negative for rash.  Neurological: Negative.  Negative for dizziness, focal weakness, weakness and headaches.  Psychiatric/Behavioral: Negative.  The patient is not nervous/anxious.     As per HPI. Otherwise, a complete review of systems is negative.  PAST MEDICAL HISTORY: Past Medical History:  Diagnosis Date  . Allergy   . Anxiety and depression   . Anxiety and depression   .  Arthritis   . Breast cancer ([place]) 1990   LT MASTECTOMY  . Cancer ([place]) 1990   LT BREAST MASTECTOMY  . CKD (chronic kidney disease), stage III ([place])   . Diabetes mellitus without complication ([place])   . GERD (gastroesophageal reflux disease)   . Hypertension   . Hypertensive CKD (chronic kidney disease)   . Personal history of chemotherapy   . Status post chemotherapy    breast cancer  . Temporal arteritis ([place]) 10/28/2016   Overview:  Right temporal arteritis  Biopsy proven 11/08/2016    PAST SURGICAL HISTORY: Past Surgical History:  Procedure Laterality Date  . ABDOMINAL HYSTERECTOMY    . BREAST SURGERY    . COLONOSCOPY WITH PROPOFOL N/A 06/04/2015   Procedure: COLONOSCOPY WITH PROPOFOL;  Surgeon: Manya Silvas, MD;  Location: [facility] ENDOSCOPY;  Service: Endoscopy;  Laterality: N/A;  . MASTECTOMY Left 1990   BREAST CA  . TEMPORAL ARTERY BIOPSY / LIGATION  11/2016    FAMILY HISTORY: Family History  Problem Relation Age of Onset  . Breast cancer Mother 41  . Cancer Mother        breast  . Cancer Father        lung    ADVANCED DIRECTIVES (Y/N):  N  HEALTH MAINTENANCE: Social History   Tobacco Use  . Smoking status: Never Smoker  . Smokeless tobacco: Never Used  Vaping Use  . Vaping Use: Never used  Substance Use Topics  . Alcohol use: No    Alcohol/week: 0.0 standard drinks  . Drug use: No     Colonoscopy:  PAP:  Bone density:  Lipid panel:  Allergies  Allergen Reactions  . Hydrochlorothiazide Other (See Comments)  Hypokalemia   . Lisinopril Other (See Comments) and Cough    Hands numb   . Metformin And Related     Fatigue, "heart was beating slower"    Current Outpatient Medications  Medication Sig Dispense Refill  . ascorbic acid (VITAMIN C) 1000 MG tablet Take by mouth.    Marland Kitchen aspirin 81 MG tablet Take 81 mg by mouth daily.    . fluticasone (FLONASE) 50 MCG/ACT nasal spray Place 2 sprays into both nostrils daily. 48 g 3  . Multiple Vitamin  (MULTIVITAMIN) tablet Take 1 tablet by mouth daily.    Dellia Nims CALCIUM + D3 500-200 MG-UNIT TABS Take 1 tablet by mouth 2 (two) times daily with a meal.  11  . spironolactone (ALDACTONE) 50 MG tablet TAKE 1 TABLET BY MOUTH EVERY DAY 90 tablet 3   No current facility-administered medications for this visit.    OBJECTIVE: Vitals:   05/01/20 1006  BP: 137/65  Pulse: 67  Resp: 20  Temp: 98 F (36.7 C)  SpO2: 100%     Body mass index is 28.43 kg/m.    ECOG FS:0 - Asymptomatic  General: Well-developed, well-nourished, no acute distress. Eyes: Pink conjunctiva, anicteric sclera. HEENT: Normocephalic, moist mucous membranes. Lungs: No audible wheezing or coughing. Heart: Regular rate and rhythm. Abdomen: Soft, nontender, no obvious distention. Musculoskeletal: No edema, cyanosis, or clubbing. Neuro: Alert, answering all questions appropriately. Cranial nerves grossly intact. Skin: No rashes or petechiae noted. Psych: Normal affect.  LAB RESULTS:  Lab Results  Component Value Date   NA 139 04/11/2020   K 4.0 04/11/2020   CL 104 04/11/2020   CO2 24 04/11/2020   GLUCOSE 134 (H) 04/11/2020   BUN 22 04/11/2020   CREATININE 1.51 (H) 04/11/2020   CALCIUM 9.7 04/11/2020   PROT 7.0 04/11/2020   ALBUMIN 4.0 04/11/2020   AST 26 04/11/2020   ALT 19 04/11/2020   ALKPHOS 62 04/11/2020   BILITOT 0.7 04/11/2020   GFRNONAA 31 (L) 04/11/2020   GFRAA 36 (L) 04/11/2020    Lab Results  Component Value Date   WBC 6.3 04/24/2020   NEUTROABS 4.1 04/07/2020   HGB 12.5 04/24/2020   HCT 39.2 04/24/2020   MCV 86.7 04/24/2020   PLT 174 04/24/2020     STUDIES: NM PET Image Initial (PI) Skull Base To Thigh  Result Date: 04/17/2020 CLINICAL DATA:  Initial treatment strategy for pancreatic mass. EXAM: NUCLEAR MEDICINE PET SKULL BASE TO THIGH TECHNIQUE: 9.6 mCi F-18 FDG was injected intravenously. Full-ring PET imaging was performed from the skull base to thigh after the radiotracer. CT data  was obtained and used for attenuation correction and anatomic localization. Fasting blood glucose: 89 mg/dl COMPARISON:  Multiple exams, including CT abdomen from 04/09/2020 FINDINGS: Mediastinal blood pool activity: SUV max 3.5 Liver activity: SUV max NA NECK: No significant abnormal hypermetabolic activity in this region. Incidental CT findings: Mild left common carotid atherosclerotic calcification. CHEST: No significant abnormal hypermetabolic activity in this region. Incidental CT findings: Coronary, aortic arch, and branch vessel atherosclerotic vascular disease. Mild cardiomegaly. Left mastectomy. ABDOMEN/PELVIS: Infiltrative mass of the pancreatic tail and body shown on recent CT has a maximum SUV of 7.7, high suspicion for pancreatic adenocarcinoma. Dilated dorsal pancreatic duct distal to the mass. Nondistention of the stomach probably accounts for the wall thickening in the stomach wall proximally given the lack of associated significant hypermetabolic activity. Hypermetabolic omental caking of tumor. A representative region of involvement in the left abdomen has a maximum SUV  of 8.8. Left para-aortic tumor just anterior to the left ureter is likely causing some extrinsic compression of the ureter. This measures 2.6 by 1.3 cm and has a maximum SUV of about 6.6. Incidental CT findings: Mild left hydronephrosis and proximal hydroureter. Aortoiliac atherosclerotic vascular disease. Small amount of pelvic ascites, possibly malignant. SKELETON: No significant abnormal hypermetabolic activity in this region. A 0.8 cm lucent lesion in the T12 vertebral body does not have appreciable associated hypermetabolic activity, and was present on 07/12/2011, hence is likely a small hemangioma or similar benign lesion. Incidental CT findings: Grade 2 anterolisthesis at L4-5 due to prominent degenerative facet arthropathy. Given the degree of facet arthropathy, I suspect that the low-grade activity in the right facet joint  is due to arthropathy rather than malignancy. Thoracic spondylosis. IMPRESSION: 1. Hypermetabolic pancreatic mass favoring pancreatic adenocarcinoma, with hypermetabolic omental caking of tumor and a small amount of pelvic ascites which may be malignant. 2. No definite hypermetabolic activity in the stomach wall, wall thickening is probably from nondistention. 3. Hypermetabolic focus of tumor to the left of the aorta adjacent to the left ureter is causing left hydronephrosis and hydroureter, potentially from extrinsic compression or ureteral invasion. 4. Other imaging findings of potential clinical significance: Aortic Atherosclerosis (ICD10-I70.0). Prominent facet arthropathy at L4-5 with grade 2 anterolisthesis. Coronary atherosclerosis. Mild cardiomegaly. Electronically Signed   By: Van Clines M.D.   On: 04/17/2020 16:53   CT Biopsy  Result Date: 04/24/2020 INDICATION: 86 year old female with recently diagnosed pancreatic mass and masslike omental thickening. EXAM: CT BIOPSY COMPARISON:  PET-CT from 04/17/2020 MEDICATIONS: None. ANESTHESIA/SEDATION: Fentanyl 50 mcg IV; Versed 1 mg IV Sedation time: 10 minutes; The patient was continuously monitored during the procedure by the interventional radiology nurse under my direct supervision. CONTRAST:  None. COMPLICATIONS: None immediate. PROCEDURE: Informed consent was obtained from the patient following an explanation of the procedure, risks, benefits and alternatives. A time out was performed prior to the initiation of the procedure. The patient was positioned supine on the CT table and a limited CT was performed for procedural planning demonstrating similar appearing omental thickening in the left anterior abdomen. The procedure was planned. The operative site was prepped and draped in the usual sterile fashion. Appropriate trajectory was confirmed with a 22 gauge spinal needle after the adjacent tissues were anesthetized with 1% Lidocaine with  epinephrine. Under intermittent CT guidance, a 17 gauge coaxial needle was advanced into the peripheral aspect of the mass. Appropriate positioning was confirmed and a total of 4 samples were obtained with an 18 gauge core needle biopsy device. The co-axial needle was removed and hemostasis was achieved with manual compression. A limited postprocedural CT was negative for hemorrhage or additional complication. A dressing was placed. The patient tolerated the procedure well without immediate postprocedural complication. IMPRESSION: Technically successful CT guided core needle biopsy of left anterior omental mass. Ruthann Cancer, MD [facility] Electronically Signed   By: Ruthann Cancer MD   On: 04/24/2020 13:43   CT HEMATURIA WORKUP  Result Date: 04/09/2020 CLINICAL DATA:  Microscopic hematuria, history of LEFT breast cancer and colon cancer by report. EXAM: CT ABDOMEN AND PELVIS WITHOUT AND WITH CONTRAST TECHNIQUE: Multidetector CT imaging of the abdomen and pelvis was performed following the standard protocol before and following the bolus administration of intravenous contrast. CONTRAST:  161mL OMNIPAQUE IOHEXOL 300 MG/ML  SOLN COMPARISON:  CT of the chest abdomen and pelvis from 20/12, no recent comparisons are available FINDINGS:  Lower chest: Lung bases are clear.  No effusion.  No consolidation. Hepatobiliary: Liver without focal lesion. The trace ascites in the setting of peritoneal carcinomatosis. Mildly nodular appearance of the RIGHT posterior hepatic margin in more since pouch. No pericholecystic stranding. No biliary duct dilation. Pancreas: Mass in the pancreas measuring 5 x 2.7 cm with peripheral ductal dilation and parenchymal atrophy. Soft tissue occluding the SMV. Main portal vein while patent into the liver is surrounded by areas of cavernous transformation likely reconstituted flow in the setting of cavernous transformation about the  pancreatic head. Splenic vein is occluded. Celiac axis is case. Soft tissue involvement of the LEFT adrenal. Signs of diffuse omental and peritoneal carcinomatosis. For instance on image 37 of series 4 is a 4.2 x 1.9 cm area peritoneal nodularity. Small volume ascites. Omental nodularity and nodularity extending through a fat containing umbilical hernia best seen on image 42 of series 4 where there is a 1 cm nodule in the body wall with adjacent omental caking on image 43 measuring 4.3 x 1.4 cm. Spleen: Spleen normal in size and contour. Adrenals/Urinary Tract: LEFT adrenal with soft tissue. LEFT sided hydronephrosis due to obstructing soft tissue mass in the LEFT retroperitoneum measuring 2.0 x 1.6 cm involving the mid LEFT ureter. Delayed enhancement of the LEFT kidney. Normal enhancement of the RIGHT kidney. Cortical loss on the LEFT implies some chronicity with respect to obstruction. Stomach/Bowel: Distal esophageal and gastric thickening with mural stratification of the gastric wall. Nodular thickening of the gastric fundus compatible with gastric involvement best seen on image 38 of series 10 the collateral pathways surround the stomach in the setting of SMV occlusion and splenic vein occlusion. Small bowel without signs of obstruction. Implants in the mesentery of the small bowel best seen on image 26 of series 10, coronal data set in the jejunal mesentery where there is a 14 mm mesenteric implant. Nodular area in the RIGHT lower quadrant small bowel mesentery approximately 8 mm adjacent to ileal loops (image 46 of series 4. Signs of previous RIGHT colectomy.  No acute colonic process Vascular/Lymphatic: Calcified and noncalcified atheromatous plaque of the abdominal aorta. No aneurysmal dilation no discrete adenopathy with LEFT retroperitoneal soft tissue obstructing LEFT ureter as outlined above. No pelvic sidewall lymphadenopathy. Reproductive: Small volume pelvic ascites. Heterogeneity of the bilateral  adnexa without discrete mass lesion. Ascites tracks into the pelvis with evidence of fascial thickening presumably related to peritoneal disease. Other: Peritoneal disease as discussed. Musculoskeletal: Subtle lucency at T12 measuring approximately cm just to the RIGHT of midline likely a metastatic focus. Spinal degenerative changes. No additional suspicious bone lesion. Excretory phase imaging limited on the LEFT due to obstruction of the mid LEFT ureter. The RIGHT-sided excretion is limited, dilute but without discrete focus of abnormality. IMPRESSION: 1. 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 Suspicious for primary pancreatic neoplasm, confounded by the presence of potential linitd plastica and gastric thickening in the fundus. Metastatic disease from breast cancer could account for these findings as well. Endoscopic assessment and biopsy is suggested. 2. Vascular encasement in the setting of pancreatic mass occluding splenic vein, SMV and encasing celiac axis with resultant collateral pathways in the upper abdomen as discussed. 3. LEFT sided hydronephrosis due to obstructing soft tissue mass in the LEFT retroperitoneum measuring 2.0 x 1.6 cm involving the mid LEFT ureter. This results in delayed enhancement and excretion of the LEFT kidney and with cortical thinning that is compatible with a chronic process. 4. Subtle lucency  at T12 measuring approximately 1 cm just to the RIGHT of midline likely a metastatic focus. 5. Aortic atherosclerosis. These results will be called to the ordering clinician or representative by the Radiologist Assistant, and communication documented in the PACS or [employer]. Aortic Atherosclerosis (ICD10-I70.0). Electronically Signed   By: Zetta Bills M.D.   On: 04/09/2020 15:59    ASSESSMENT: Stage IV pancreatic cancer.  PLAN:   1.  Stage IV pancreatic cancer: Omental biopsy confirmed results.  PET scans results from April 09, 2020  reviewed independently and reported as above confirming stage of disease.  Patient CA 19-9 is also significantly elevated at 19,661.  After lengthy discussion with the patient, she wishes to pursue palliative chemotherapy using single agent gemcitabine on days 1, 8, 15 of a 28-day cycle.  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  We also discussed the possibility of adding in capecitabine, but patient does not wish to do this at this time but may consider in the future if she is tolerating single agent gemcitabine well.  Patient will require port placement prior to initiating treatment.  Return to clinic on Monday, May 12, 2020 to initiate cycle 1, day 1 of gemcitabine.  2.  MGUS: Patient's most recent M spike is only 0.2.  This is likely clinically insignificant.  No further work-up is necessary. 3.  History of colon cancer: Patient had stage I colon cancer 7 or 8 years ago.   4.  History of breast cancer: 31 years ago.  Unclear stage or type of malignancy. 5.  Renal insufficiency: Likely secondary to obstruction from omental caking.  Mildly improved, monitor.    Patient expressed understanding and was in agreement with this plan. She also understands that She can call clinic at any time with any questions, concerns, or complaints.    Lloyd Huger, MD   05/01/2020 2:00 PM

## 2020-04-28 LAB — SURGICAL PATHOLOGY

## 2020-04-30 ENCOUNTER — Encounter: Payer: Self-pay | Admitting: Oncology

## 2020-04-30 NOTE — Progress Notes (Signed)
Patient called for pre assessment. Spoke to patient's daughter. She denied patient is having any pain or concerns at this time.

## 2020-05-01 ENCOUNTER — Inpatient Hospital Stay (HOSPITAL_BASED_OUTPATIENT_CLINIC_OR_DEPARTMENT_OTHER): Payer: Medicare Other | Admitting: Oncology

## 2020-05-01 ENCOUNTER — Telehealth: Payer: Self-pay

## 2020-05-01 ENCOUNTER — Encounter: Payer: Self-pay | Admitting: Oncology

## 2020-05-01 ENCOUNTER — Other Ambulatory Visit: Payer: Self-pay

## 2020-05-01 VITALS — BP 137/65 | HR 67 | Temp 98.0°F | Resp 20 | Wt 165.6 lb

## 2020-05-01 DIAGNOSIS — C259 Malignant neoplasm of pancreas, unspecified: Secondary | ICD-10-CM | POA: Diagnosis not present

## 2020-05-01 DIAGNOSIS — Z7189 Other specified counseling: Secondary | ICD-10-CM | POA: Diagnosis not present

## 2020-05-01 DIAGNOSIS — K8689 Other specified diseases of pancreas: Secondary | ICD-10-CM

## 2020-05-01 MED ORDER — LIDOCAINE-PRILOCAINE 2.5-2.5 % EX CREA: TOPICAL_CREAM | CUTANEOUS | 3 refills | Status: DC

## 2020-05-01 MED ORDER — PROCHLORPERAZINE MALEATE 10 MG PO TABS: 10.0000 mg | ORAL_TABLET | Freq: Four times a day (QID) | ORAL | 3 refills | Status: DC | PRN

## 2020-05-01 NOTE — Progress Notes (Signed)
START OFF PATHWAY REGIMEN - Pancreatic Adenocarcinoma   OFF00015:Gemcitabine 1,000 mg/m2 IV D1,8,15 q28 Days:   A cycle is every 28 days:     Gemcitabine   **Always confirm dose/schedule in your pharmacy ordering system**  Patient Characteristics: Metastatic Disease, First Line, PS = 0,1, BRCA1/2 and PALB2  Mutation Absent/Unknown Therapeutic Status: Metastatic Disease Line of Therapy: First Line ECOG Performance Status: 0 BRCA1/2 Mutation Status: Did Not Order Test PALB2 Mutation Status: Did Not Order Test Intent of Therapy: Non-Curative / Palliative Intent, Discussed with Patient

## 2020-05-01 NOTE — Telephone Encounter (Signed)
Called patient's daughter to inform her of appointment scheduled for patient's port placement on October 29 at 9:30 am. Arrive by 8:30 am. She verbalized understanding and denied further questions.

## 2020-05-05 NOTE — Patient Instructions (Signed)
Gemcitabine injection What is this medicine? GEMCITABINE (jem SYE ta been) is a chemotherapy drug. This medicine is used to treat many types of cancer like breast cancer, lung cancer, pancreatic cancer, and ovarian cancer. This medicine may be used for other purposes; ask your health care provider or pharmacist if you have questions. COMMON BRAND NAME(S): Gemzar, Infugem What should I tell my health care provider before I take this medicine? They need to know if you have any of these conditions:  blood disorders  infection  kidney disease  liver disease  lung or breathing disease, like asthma  recent or ongoing radiation therapy  an unusual or allergic reaction to gemcitabine, other chemotherapy, other medicines, foods, dyes, or preservatives  pregnant or trying to get pregnant  breast-feeding How should I use this medicine? This drug is given as an infusion into a vein. It is administered in a hospital or clinic by a specially trained health care professional. Talk to your pediatrician regarding the use of this medicine in children. Special care may be needed. Overdosage: If you think you have taken too much of this medicine contact a poison control center or emergency room at once. NOTE: This medicine is only for you. Do not share this medicine with others. What if I miss a dose? It is important not to miss your dose. Call your doctor or health care professional if you are unable to keep an appointment. What may interact with this medicine?  medicines to increase blood counts like filgrastim, pegfilgrastim, sargramostim  some other chemotherapy drugs like cisplatin  vaccines Talk to your doctor or health care professional before taking any of these medicines:  acetaminophen  aspirin  ibuprofen  ketoprofen  naproxen This list may not describe all possible interactions. Give your health care provider a list of all the medicines, herbs, non-prescription drugs, or  dietary supplements you use. Also tell them if you smoke, drink alcohol, or use illegal drugs. Some items may interact with your medicine. What should I watch for while using this medicine? Visit your doctor for checks on your progress. This drug may make you feel generally unwell. This is not uncommon, as chemotherapy can affect healthy cells as well as cancer cells. Report any side effects. Continue your course of treatment even though you feel ill unless your doctor tells you to stop. In some cases, you may be given additional medicines to help with side effects. Follow all directions for their use. Call your doctor or health care professional for advice if you get a fever, chills or sore throat, or other symptoms of a cold or flu. Do not treat yourself. This drug decreases your body's ability to fight infections. Try to avoid being around people who are sick. This medicine may increase your risk to bruise or bleed. Call your doctor or health care professional if you notice any unusual bleeding. Be careful brushing and flossing your teeth or using a toothpick because you may get an infection or bleed more easily. If you have any dental work done, tell your dentist you are receiving this medicine. Avoid taking products that contain aspirin, acetaminophen, ibuprofen, naproxen, or ketoprofen unless instructed by your doctor. These medicines may hide a fever. Do not become pregnant while taking this medicine or for 6 months after stopping it. Women should inform their doctor if they wish to become pregnant or think they might be pregnant. Men should not father a child while taking this medicine and for 3 months after stopping it.   There is a potential for serious side effects to an unborn child. Talk to your health care professional or pharmacist for more information. Do not breast-feed an infant while taking this medicine or for at least 1 week after stopping it. Men should inform their doctors if they wish  to father a child. This medicine may lower sperm counts. Talk with your doctor or health care professional if you are concerned about your fertility. What side effects may I notice from receiving this medicine? Side effects that you should report to your doctor or health care professional as soon as possible:  allergic reactions like skin rash, itching or hives, swelling of the face, lips, or tongue  breathing problems  pain, redness, or irritation at site where injected  signs and symptoms of a dangerous change in heartbeat or heart rhythm like chest pain; dizziness; fast or irregular heartbeat; palpitations; feeling faint or lightheaded, falls; breathing problems  signs of decreased platelets or bleeding - bruising, pinpoint red spots on the skin, black, tarry stools, blood in the urine  signs of decreased red blood cells - unusually weak or tired, feeling faint or lightheaded, falls  signs of infection - fever or chills, cough, sore throat, pain or difficulty passing urine  signs and symptoms of kidney injury like trouble passing urine or change in the amount of urine  signs and symptoms of liver injury like dark yellow or brown urine; general ill feeling or flu-like symptoms; light-colored stools; loss of appetite; nausea; right upper belly pain; unusually weak or tired; yellowing of the eyes or skin  swelling of ankles, feet, hands Side effects that usually do not require medical attention (report to your doctor or health care professional if they continue or are bothersome):  constipation  diarrhea  hair loss  loss of appetite  nausea  rash  vomiting This list may not describe all possible side effects. Call your doctor for medical advice about side effects. You may report side effects to FDA at 1-800-FDA-1088. Where should I keep my medicine? This drug is given in a hospital or clinic and will not be stored at home. NOTE: This sheet is a summary. It may not cover all  possible information. If you have questions about this medicine, talk to your doctor, pharmacist, or health care provider.  2020 Elsevier/Gold Standard (2017-09-21 18:06:11)  

## 2020-05-05 NOTE — Progress Notes (Signed)
Pharmacist Chemotherapy Monitoring - Initial Assessment    Anticipated start date: 05/12/20  Regimen:  . Are orders appropriate based on the patient's diagnosis, regimen, and cycle? Yes . Does the plan date match the patient's scheduled date? Yes . Is the sequencing of drugs appropriate? Yes . Are the premedications appropriate for the patient's regimen? Yes . Prior Authorization for treatment is: Approved o If applicable, is the correct biosimilar selected based on the patient's insurance? not applicable  Organ Function and Labs: Marland Kitchen Are dose adjustments needed based on the patient's renal function, hepatic function, or hematologic function? No . Are appropriate labs ordered prior to the start of patient's treatment? Yes . Other organ system assessment, if indicated: N/A . The following baseline labs, if indicated, have been ordered: N/A  Dose Assessment: . Are the drug doses appropriate? Yes . Are the following correct: o Drug concentrations Yes o IV fluid compatible with drug Yes o Administration routes Yes o Timing of therapy Yes . If applicable, does the patient have documented access for treatment and/or plans for port-a-cath placement? yes . If applicable, have lifetime cumulative doses been properly documented and assessed? yes Lifetime Dose Tracking  No doses have been documented on this patient for the following tracked chemicals: Doxorubicin, Epirubicin, Idarubicin, Daunorubicin, Mitoxantrone, Bleomycin, Oxaliplatin, Carboplatin, Liposomal Doxorubicin  o   Toxicity Monitoring/Prevention: . The patient has the following take home antiemetics prescribed: Prochlorperazine . The patient has the following take home medications prescribed: N/A . Medication allergies and previous infusion related reactions, if applicable, have been reviewed and addressed. Yes . The patient's current medication list has been assessed for drug-drug interactions with their chemotherapy regimen. no  significant drug-drug interactions were identified on review.  Order Review: . Are the treatment plan orders signed? Yes . Is the patient scheduled to see a provider prior to their treatment? Yes  I verify that I have reviewed each item in the above checklist and answered each question accordingly.  Veronica Colon 05/05/2020 2:07 PM

## 2020-05-06 ENCOUNTER — Inpatient Hospital Stay: Payer: Medicare Other

## 2020-05-06 ENCOUNTER — Inpatient Hospital Stay (HOSPITAL_BASED_OUTPATIENT_CLINIC_OR_DEPARTMENT_OTHER): Payer: Medicare Other | Admitting: Nurse Practitioner

## 2020-05-06 ENCOUNTER — Other Ambulatory Visit: Payer: Self-pay

## 2020-05-06 DIAGNOSIS — C259 Malignant neoplasm of pancreas, unspecified: Secondary | ICD-10-CM

## 2020-05-06 NOTE — Progress Notes (Signed)
[facility]  Telephone:([phone] [phone]  Patient Care Team: Delsa Grana, PA-C as PCP - General (Family Medicine) Marlowe Sax, MD as Referring Physician (Internal Medicine) Birder Robson, MD as Referring Physician (Ophthalmology)   Name of the patient: Veronica Colon  [id number]  11/03/1931   Date of visit: 05/06/20  Diagnosis-stage IV pancreatic adenocarcinoma  Chief complaint/Reason for visit- Initial Meeting for [facility], preparing for starting chemotherapy  Heme/Onc history:  Oncology History  Pancreatic adenocarcinoma ([place])  05/01/2020 Initial Diagnosis   Pancreatic adenocarcinoma ([place])   05/12/2020 -  Chemotherapy   The patient had gemcitabine (GEMZAR) 1,824 mg in sodium chloride 0.9 % 250 mL chemo infusion, 1,000 mg/m2 = 1,824 mg, Intravenous,  Once, 0 of 6 cycles  for chemotherapy treatment.      Interval history-  Veronica Colon, 86 year old female, who presents to chemo care clinic today for initial meeting in preparation for starting chemotherapy. I introduced the chemo care clinic and we discussed that the role of the clinic is to assist those who are at an increased risk of emergency room visits and/or complications during the course of chemotherapy treatment. We discussed that the increased risk takes into account factors such as age, performance status, and co-morbidities. We also discussed that for some, this might include barriers to care such as not having a primary care provider, lack of insurance/transportation, or not being able to afford medications. We discussed that the goal of the program is to help prevent unplanned ER visits and help reduce complications during chemotherapy. We do this by discussing specific risk factors to each individual and identifying ways that we can help improve these risk factors and reduce barriers to care.   Review of systems- Review of Systems    Constitutional: Negative for chills, fever, malaise/fatigue and weight loss.  HENT: Negative for hearing loss, nosebleeds, sore throat and tinnitus.   Eyes: Negative for blurred vision and double vision.  Respiratory: Negative for cough, hemoptysis, shortness of breath and wheezing.   Cardiovascular: Negative for chest pain, palpitations and leg swelling.  Gastrointestinal: Negative for abdominal pain, blood in stool, constipation, diarrhea, melena, nausea and vomiting.       Abdominal fullness  Genitourinary: Negative for dysuria and urgency.  Musculoskeletal: Negative for back pain, falls, joint pain and myalgias.  Skin: Negative for itching and rash.  Neurological: Negative for dizziness, tingling, sensory change, loss of consciousness, weakness and headaches.  Endo/Heme/Allergies: Negative for environmental allergies. Does not bruise/bleed easily.  Psychiatric/Behavioral: Negative for depression. The patient is not nervous/anxious and does not have insomnia.     Allergies  Allergen Reactions  . Hydrochlorothiazide Other (See Comments)    Hypokalemia   . Lisinopril Other (See Comments) and Cough    Hands numb   . Metformin And Related     Fatigue, "heart was beating slower"    Past Medical History:  Diagnosis Date  . Allergy   . Anxiety and depression   . Anxiety and depression   . Arthritis   . Breast cancer ([place]) 1990   LT MASTECTOMY  . Cancer ([place]) 1990   LT BREAST MASTECTOMY  . CKD (chronic kidney disease), stage III ([place])   . Diabetes mellitus without complication ([place])   . GERD (gastroesophageal reflux disease)   . Hypertension   . Hypertensive CKD (chronic kidney disease)   . Personal history of chemotherapy   . Status post chemotherapy    breast  cancer  . Temporal arteritis ([place]) 10/28/2016   Overview:  Right temporal arteritis  Biopsy proven 11/08/2016    Past Surgical History:  Procedure Laterality Date  . ABDOMINAL HYSTERECTOMY    . BREAST SURGERY     . COLONOSCOPY WITH PROPOFOL N/A 06/04/2015   Procedure: COLONOSCOPY WITH PROPOFOL;  Surgeon: Manya Silvas, MD;  Location[facility] ENDOSCOPY;  Service: Endoscopy;  Laterality: N/A;  . MASTECTOMY Left 1990   BREAST CA  . TEMPORAL ARTERY BIOPSY / LIGATION  11/2016    Social History   Socioeconomic History  . Marital status: Widowed    Spouse name: Not on file  . Number of children: 1  . Years of education: 14  . Highest education level: Not on file  Occupational History  . Occupation: retired  Tobacco Use  . Smoking status: Never Smoker  . Smokeless tobacco: Never Used  Vaping Use  . Vaping Use: Never used  Substance and Sexual Activity  . Alcohol use: No    Alcohol/week: 0.0 standard drinks  . Drug use: No  . Sexual activity: Not Currently  Other Topics Concern  . Not on file  Social History Narrative   Pt lives with her daughter   Social Determinants of Health   Financial Resource Strain: Low Risk   . Difficulty of Paying Living Expenses: Not hard at all  Food Insecurity: No Food Insecurity  . Worried About Charity fundraiser in the Last Year: Never true  . Ran Out of Food in the Last Year: Never true  Transportation Needs: No Transportation Needs  . Lack of Transportation (Medical): No  . Lack of Transportation (Non-Medical): No  Physical Activity: Inactive  . Days of Exercise per Week: 0 days  . Minutes of Exercise per Session: 0 min  Stress: No Stress Concern Present  . Feeling of Stress : Not at all  Social Connections: Moderately Integrated  . Frequency of Communication with Friends and Family: More than three times a week  . Frequency of Social Gatherings with Friends and Family: Twice a week  . Attends Religious Services: More than 4 times per year  . Active Member of Clubs or Organizations: Yes  . Attends Archivist Meetings: More than 4 times per year  . Marital Status: Divorced  Human resources officer Violence: Not At Risk  . Fear of Current or  Ex-Partner: No  . Emotionally Abused: No  . Physically Abused: No  . Sexually Abused: No    Family History  Problem Relation Age of Onset  . Breast cancer Mother 22  . Cancer Mother        breast  . Cancer Father        lung     Current Outpatient Medications:  .  ascorbic acid (VITAMIN C) 1000 MG tablet, Take by mouth., Disp: , Rfl:  .  aspirin 81 MG tablet, Take 81 mg by mouth daily., Disp: , Rfl:  .  fluticasone (FLONASE) 50 MCG/ACT nasal spray, Place 2 sprays into both nostrils daily., Disp: 48 g, Rfl: 3 .  lidocaine-prilocaine (EMLA) cream, Apply to affected area once, Disp: 30 g, Rfl: 3 .  Multiple Vitamin (MULTIVITAMIN) tablet, Take 1 tablet by mouth daily., Disp: , Rfl:  .  OS-CAL CALCIUM + D3 500-200 MG-UNIT TABS, Take 1 tablet by mouth 2 (two) times daily with a meal., Disp: , Rfl: 11 .  prochlorperazine (COMPAZINE) 10 MG tablet, Take 1 tablet (10 mg total) by mouth every 6 (six)  hours as needed (Nausea or vomiting)., Disp: 60 tablet, Rfl: 3 .  spironolactone (ALDACTONE) 50 MG tablet, TAKE 1 TABLET BY MOUTH EVERY DAY, Disp: 90 tablet, Rfl: 3  Physical exam: There were no vitals filed for this visit. Physical Exam   CMP Latest Ref Rng & Units 04/11/2020  Glucose 70 - 99 mg/dL 134(H)  BUN 8 - 23 mg/dL 22  Creatinine 0.44 - 1.00 mg/dL 1.51(H)  Sodium 135 - 145 mmol/L 139  Potassium 3.5 - 5.1 mmol/L 4.0  Chloride 98 - 111 mmol/L 104  CO2 22 - 32 mmol/L 24  Calcium 8.9 - 10.3 mg/dL 9.7  Total Protein 6.5 - 8.1 g/dL 7.0  Total Bilirubin 0.3 - 1.2 mg/dL 0.7  Alkaline Phos 38 - 126 U/L 62  AST 15 - 41 U/L 26  ALT 0 - 44 U/L 19   CBC Latest Ref Rng & Units 04/24/2020  WBC 4.0 - 10.5 K/uL 6.3  Hemoglobin 12.0 - 15.0 g/dL 12.5  Hematocrit 36 - 46 % 39.2  Platelets 150 - 400 K/uL 174    No images are attached to the encounter.  NM PET Image Initial (PI) Skull Base To Thigh  Result Date: 04/17/2020 CLINICAL DATA:  Initial treatment strategy for pancreatic mass.  EXAM: NUCLEAR MEDICINE PET SKULL BASE TO THIGH TECHNIQUE: 9.6 mCi F-18 FDG was injected intravenously. Full-ring PET imaging was performed from the skull base to thigh after the radiotracer. CT data was obtained and used for attenuation correction and anatomic localization. Fasting blood glucose: 89 mg/dl COMPARISON:  Multiple exams, including CT abdomen from 04/09/2020 FINDINGS: Mediastinal blood pool activity: SUV max 3.5 Liver activity: SUV max NA NECK: No significant abnormal hypermetabolic activity in this region. Incidental CT findings: Mild left common carotid atherosclerotic calcification. CHEST: No significant abnormal hypermetabolic activity in this region. Incidental CT findings: Coronary, aortic arch, and branch vessel atherosclerotic vascular disease. Mild cardiomegaly. Left mastectomy. ABDOMEN/PELVIS: Infiltrative mass of the pancreatic tail and body shown on recent CT has a maximum SUV of 7.7, high suspicion for pancreatic adenocarcinoma. Dilated dorsal pancreatic duct distal to the mass. Nondistention of the stomach probably accounts for the wall thickening in the stomach wall proximally given the lack of associated significant hypermetabolic activity. Hypermetabolic omental caking of tumor. A representative region of involvement in the left abdomen has a maximum SUV of 8.8. Left para-aortic tumor just anterior to the left ureter is likely causing some extrinsic compression of the ureter. This measures 2.6 by 1.3 cm and has a maximum SUV of about 6.6. Incidental CT findings: Mild left hydronephrosis and proximal hydroureter. Aortoiliac atherosclerotic vascular disease. Small amount of pelvic ascites, possibly malignant. SKELETON: No significant abnormal hypermetabolic activity in this region. A 0.8 cm lucent lesion in the T12 vertebral body does not have appreciable associated hypermetabolic activity, and was present on 07/12/2011, hence is likely a small hemangioma or similar benign lesion.  Incidental CT findings: Grade 2 anterolisthesis at L4-5 due to prominent degenerative facet arthropathy. Given the degree of facet arthropathy, I suspect that the low-grade activity in the right facet joint is due to arthropathy rather than malignancy. Thoracic spondylosis. IMPRESSION: 1. Hypermetabolic pancreatic mass favoring pancreatic adenocarcinoma, with hypermetabolic omental caking of tumor and a small amount of pelvic ascites which may be malignant. 2. No definite hypermetabolic activity in the stomach wall, wall thickening is probably from nondistention. 3. Hypermetabolic focus of tumor to the left of the aorta adjacent to the left ureter is causing left hydronephrosis and hydroureter,  potentially from extrinsic compression or ureteral invasion. 4. Other imaging findings of potential clinical significance: Aortic Atherosclerosis (ICD10-I70.0). Prominent facet arthropathy at L4-5 with grade 2 anterolisthesis. Coronary atherosclerosis. Mild cardiomegaly. Electronically Signed   By: Van Clines M.D.   On: 04/17/2020 16:53   CT Biopsy  Result Date: 04/24/2020 INDICATION: 86 year old female with recently diagnosed pancreatic mass and masslike omental thickening. EXAM: CT BIOPSY COMPARISON:  PET-CT from 04/17/2020 MEDICATIONS: None. ANESTHESIA/SEDATION: Fentanyl 50 mcg IV; Versed 1 mg IV Sedation time: 10 minutes; The patient was continuously monitored during the procedure by the interventional radiology nurse under my direct supervision. CONTRAST:  None. COMPLICATIONS: None immediate. PROCEDURE: Informed consent was obtained from the patient following an explanation of the procedure, risks, benefits and alternatives. A time out was performed prior to the initiation of the procedure. The patient was positioned supine on the CT table and a limited CT was performed for procedural planning demonstrating similar appearing omental thickening in the left anterior abdomen. The procedure was planned. The  operative site was prepped and draped in the usual sterile fashion. Appropriate trajectory was confirmed with a 22 gauge spinal needle after the adjacent tissues were anesthetized with 1% Lidocaine with epinephrine. Under intermittent CT guidance, a 17 gauge coaxial needle was advanced into the peripheral aspect of the mass. Appropriate positioning was confirmed and a total of 4 samples were obtained with an 18 gauge core needle biopsy device. The co-axial needle was removed and hemostasis was achieved with manual compression. A limited postprocedural CT was negative for hemorrhage or additional complication. A dressing was placed. The patient tolerated the procedure well without immediate postprocedural complication. IMPRESSION: Technically successful CT guided core needle biopsy of left anterior omental mass. Ruthann Cancer, MD [facility] Electronically Signed   By: Ruthann Cancer MD   On: 04/24/2020 13:43   CT HEMATURIA WORKUP  Result Date: 04/09/2020 CLINICAL DATA:  Microscopic hematuria, history of LEFT breast cancer and colon cancer by report. EXAM: CT ABDOMEN AND PELVIS WITHOUT AND WITH CONTRAST TECHNIQUE: Multidetector CT imaging of the abdomen and pelvis was performed following the standard protocol before and following the bolus administration of intravenous contrast. CONTRAST:  171m OMNIPAQUE IOHEXOL 300 MG/ML  SOLN COMPARISON:  CT of the chest abdomen and pelvis from 20/12, no recent comparisons are available FINDINGS: Lower chest: Lung bases are clear.  No effusion.  No consolidation. Hepatobiliary: Liver without focal lesion. The trace ascites in the setting of peritoneal carcinomatosis. Mildly nodular appearance of the RIGHT posterior hepatic margin in more since pouch. No pericholecystic stranding. No biliary duct dilation. Pancreas: Mass in the pancreas measuring 5 x 2.7 cm with peripheral ductal dilation and parenchymal atrophy. Soft  tissue occluding the SMV. Main portal vein while patent into the liver is surrounded by areas of cavernous transformation likely reconstituted flow in the setting of cavernous transformation about the pancreatic head. Splenic vein is occluded. Celiac axis is case. Soft tissue involvement of the LEFT adrenal. Signs of diffuse omental and peritoneal carcinomatosis. For instance on image 37 of series 4 is a 4.2 x 1.9 cm area peritoneal nodularity. Small volume ascites. Omental nodularity and nodularity extending through a fat containing umbilical hernia best seen on image 42 of series 4 where there is a 1 cm nodule in the body wall with adjacent omental caking on image 43 measuring 4.3 x 1.4 cm. Spleen: Spleen normal in size and contour. Adrenals/Urinary Tract: LEFT adrenal with soft tissue. LEFT sided hydronephrosis  due to obstructing soft tissue mass in the LEFT retroperitoneum measuring 2.0 x 1.6 cm involving the mid LEFT ureter. Delayed enhancement of the LEFT kidney. Normal enhancement of the RIGHT kidney. Cortical loss on the LEFT implies some chronicity with respect to obstruction. Stomach/Bowel: Distal esophageal and gastric thickening with mural stratification of the gastric wall. Nodular thickening of the gastric fundus compatible with gastric involvement best seen on image 38 of series 10 the collateral pathways surround the stomach in the setting of SMV occlusion and splenic vein occlusion. Small bowel without signs of obstruction. Implants in the mesentery of the small bowel best seen on image 26 of series 10, coronal data set in the jejunal mesentery where there is a 14 mm mesenteric implant. Nodular area in the RIGHT lower quadrant small bowel mesentery approximately 8 mm adjacent to ileal loops (image 46 of series 4. Signs of previous RIGHT colectomy.  No acute colonic process Vascular/Lymphatic: Calcified and noncalcified atheromatous plaque of the abdominal aorta. No aneurysmal dilation no discrete  adenopathy with LEFT retroperitoneal soft tissue obstructing LEFT ureter as outlined above. No pelvic sidewall lymphadenopathy. Reproductive: Small volume pelvic ascites. Heterogeneity of the bilateral adnexa without discrete mass lesion. Ascites tracks into the pelvis with evidence of fascial thickening presumably related to peritoneal disease. Other: Peritoneal disease as discussed. Musculoskeletal: Subtle lucency at T12 measuring approximately cm just to the RIGHT of midline likely a metastatic focus. Spinal degenerative changes. No additional suspicious bone lesion. Excretory phase imaging limited on the LEFT due to obstruction of the mid LEFT ureter. The RIGHT-sided excretion is limited, dilute but without discrete focus of abnormality. IMPRESSION: 1. 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 Suspicious for primary pancreatic neoplasm, confounded by the presence of potential linitd plastica and gastric thickening in the fundus. Metastatic disease from breast cancer could account for these findings as well. Endoscopic assessment and biopsy is suggested. 2. Vascular encasement in the setting of pancreatic mass occluding splenic vein, SMV and encasing celiac axis with resultant collateral pathways in the upper abdomen as discussed. 3. LEFT sided hydronephrosis due to obstructing soft tissue mass in the LEFT retroperitoneum measuring 2.0 x 1.6 cm involving the mid LEFT ureter. This results in delayed enhancement and excretion of the LEFT kidney and with cortical thinning that is compatible with a chronic process. 4. Subtle lucency at T12 measuring approximately 1 cm just to the RIGHT of midline likely a metastatic focus. 5. Aortic atherosclerosis. These results will be called to the ordering clinician or representative by the Radiologist Assistant, and communication documented in the PACS or [employer]. Aortic Atherosclerosis (ICD10-I70.0). Electronically Signed   By: Zetta Bills  M.D.   On: 04/09/2020 15:59     Assessment and plan- Patient is a 86 y.o. female who presents to [facility] for initial meeting in preparation for starting chemotherapy for the treatment of adenocarcinoma of the pancreas metastatic to omentum.   1. Stage IV pancreatic cancer-omental biopsy confirmed results. PET scan from 04/09/2020 confirms stage of disease. NCCN guidelines reviewed.   Referral for germline genetic testing sent today. Discussed somatic/tumor testing with Dr. Grayland Ormond who will order [facility] and MSI/MMR. Pathology from 04/24/20 indicates sufficient tissue for PD-L1 and MMR by IHC.   Dr. Grayland Ormond plans for palliative gemcitabine on days one, eight, 15 of 28-day cycle.   2. Chemo Care Clinic/High Risk for ER/Hospitalization during chemotherapy- We discussed the role of the chemo care clinic and identified patient specific risk factors. I discussed that  patient was identified as high risk primarily based on age and comorbidities.  3. Social Determinants of Health- we discussed that social determinants of health may have significant impacts on health and outcomes for cancer patients.  Today we discussed specific social determinants of performance status, alcohol use, depression, financial needs, food insecurity, housing, interpersonal violence, social connections, stress, tobacco use, and transportation.  We extensively discussed programs and services available through the cancer center including but not limited to outpatient occupational therapy, care program, counseling services, palliative medicine, symptom management clinic, social work, Public librarian, Building surveyor, support groups, nurse navigation, dietitian, smoking cessation, and transportation assistance.  4. Palliative Care- based on stage of cancer and/or identified needs today, I will refer patient to palliative care for goals of care and advanced care planning.  We also discussed the role of the Symptom  Management Clinic at [facility] for acute issues and methods of contacting clinic/provider. She denies needing specific assistance at this time and She will be followed by Mariea Clonts, RN (Nurse Navigator).   Visit Diagnosis 1. Pancreatic adenocarcinoma [facility])    Patient expressed understanding and was in agreement with this plan. She also understands that She can call clinic at any time with any questions, concerns, or complaints.   A total of (15) minutes of face-to-face time was spent with this patient with greater than 50% of that time in counseling and care-coordination.  Beckey Rutter, DNP, AGNP-C [facility]:

## 2020-05-07 LAB — HM DIABETES EYE EXAM

## 2020-05-08 ENCOUNTER — Other Ambulatory Visit: Payer: Self-pay | Admitting: Radiology

## 2020-05-08 NOTE — Progress Notes (Signed)
Patient on schedule for [facility] 10/29, spoke with daughter on phone. Made aware to be here @ 0815, NPO after [place], and driver for home after discharge. Stated understanding.

## 2020-05-09 ENCOUNTER — Encounter: Payer: Self-pay | Admitting: Emergency Medicine

## 2020-05-09 ENCOUNTER — Ambulatory Visit
Admission: RE | Admit: 2020-05-09 | Discharge: 2020-05-09 | Disposition: A | Discharge status: Home / Self Care | Payer: Medicare Other | Source: Ambulatory Visit | Attending: Oncology | Admitting: Oncology

## 2020-05-09 ENCOUNTER — Other Ambulatory Visit: Payer: Self-pay

## 2020-05-09 DIAGNOSIS — C259 Malignant neoplasm of pancreas, unspecified: Secondary | ICD-10-CM | POA: Diagnosis not present

## 2020-05-09 DIAGNOSIS — K8689 Other specified diseases of pancreas: Secondary | ICD-10-CM

## 2020-05-09 HISTORY — PX: IR IMAGING GUIDED PORT INSERTION: IMG5740

## 2020-05-09 MED ORDER — FENTANYL CITRATE (PF) 100 MCG/2ML IJ SOLN
INTRAMUSCULAR | Status: AC | PRN
Start: 2020-05-09 — End: 2020-05-09
  Administered 2020-05-09 (×2): 25 ug via INTRAVENOUS

## 2020-05-09 MED ORDER — MIDAZOLAM HCL 2 MG/2ML IJ SOLN
INTRAMUSCULAR | Status: AC | PRN
  Administered 2020-05-09 (×2): 0.5 mg via INTRAVENOUS

## 2020-05-09 MED ORDER — MIDAZOLAM HCL 2 MG/2ML IJ SOLN
INTRAMUSCULAR | Status: AC
  Filled 2020-05-09: qty 2

## 2020-05-09 MED ORDER — HEPARIN SOD (PORK) LOCK FLUSH 100 UNIT/ML IV SOLN
INTRAVENOUS | Status: AC
  Filled 2020-05-09: qty 5

## 2020-05-09 MED ORDER — CEFAZOLIN SODIUM-DEXTROSE 2-4 GM/100ML-% IV SOLN: 2.0000 g | INTRAVENOUS | Status: AC

## 2020-05-09 MED ORDER — CEFAZOLIN SODIUM-DEXTROSE 2-4 GM/100ML-% IV SOLN
INTRAVENOUS | Status: AC
  Administered 2020-05-09: 2 g via INTRAVENOUS
  Filled 2020-05-09: qty 100

## 2020-05-09 MED ORDER — FENTANYL CITRATE (PF) 100 MCG/2ML IJ SOLN
INTRAMUSCULAR | Status: AC
  Filled 2020-05-09: qty 2

## 2020-05-09 MED ORDER — SODIUM CHLORIDE 0.9 % IV SOLN: INTRAVENOUS | Status: DC

## 2020-05-09 NOTE — Progress Notes (Signed)
Patient awake/alert x4. Daughter at bedside. No c/o's

## 2020-05-09 NOTE — Progress Notes (Signed)
Consent obtained

## 2020-05-09 NOTE — Progress Notes (Signed)
Daughter and patient verbalize understanding of procedure, follow up 11/1, discharge instructions. Reviewed port at length with daughter.  Patient tolerated sandwich and juice prior to discharge.

## 2020-05-09 NOTE — Discharge Instructions (Signed)
Implanted Port Home Guide  An implanted port is a type of central line that is placed under the skin. Central lines are used to provide IV access when treatment or nutrition needs to be given through a person's veins. Implanted ports are used for long-term IV access. An implanted port may be placed because: You need IV medicine that would be irritating to the small veins in your hands or arms. You need long-term IV medicines, such as antibiotics. You need IV nutrition for a long period. You need frequent blood draws for lab tests. You need dialysis.   Implanted ports are usually placed in the chest area, but they can also be placed in the upper arm, the abdomen, or the leg. An implanted port has two main parts: Reservoir. The reservoir is round and will appear as a small, raised area under your skin. The reservoir is the part where a needle is inserted to give medicines or draw blood. Catheter. The catheter is a thin, flexible tube that extends from the reservoir. The catheter is placed into a large vein. Medicine that is inserted into the reservoir goes into the catheter and then into the vein.   How will I care for my incision  You may shower tomorrow  How is my port accessed? Special steps must be taken to access the port: Before the port is accessed, a numbing cream can be placed on the skin. This helps numb the skin over the port site. Your health care provider uses a sterile technique to access the port. Your health care provider must put on a mask and sterile gloves. The skin over your port is cleaned carefully with an antiseptic and allowed to dry. The port is gently pinched between sterile gloves, and a needle is inserted into the port. Only "non-coring" port needles should be used to access the port. Once the port is accessed, a blood return should be checked. This helps ensure that the port is in the vein and is not clogged. If your port needs to remain accessed for a constant  infusion, a clear (transparent) bandage will be placed over the needle site. The bandage and needle will need to be changed every week, or as directed by your health care provider.   What is flushing? Flushing helps keep the port from getting clogged. Follow your health care provider's instructions on how and when to flush the port. Ports are usually flushed with saline solution or a medicine called heparin. The need for flushing will depend on how the port is used. If the port is used for intermittent medicines or blood draws, the port will need to be flushed: After medicines have been given. After blood has been drawn. As part of routine maintenance. If a constant infusion is running, the port may not need to be flushed.   How long will my port stay implanted? The port can stay in for as long as your health care provider thinks it is needed. When it is time for the port to come out, surgery will be done to remove it. The procedure is similar to the one performed when the port was put in. When should I seek immediate medical care? When you have an implanted port, you should seek immediate medical care if: You notice a bad smell coming from the incision site. You have swelling, redness, or drainage at the incision site. You have more swelling or pain at the port site or the surrounding area. You have a fever that   is not controlled with medicine.   This information is not intended to replace advice given to you by your health care provider. Make sure you discuss any questions you have with your health care provider. Document Released: 06/28/2005 Document Revised: 12/04/2015 Document Reviewed: 03/05/2013 Elsevier Interactive Patient Education  2017 Else[employer] Inc[employer]    

## 2020-05-09 NOTE — Procedures (Signed)
Interventional Radiology Procedure Note  Procedure: Veronica Colon    Complications: None  Estimated Blood Loss:  min  Findings: Tip svcra    [name], MD

## 2020-05-09 NOTE — H&P (Signed)
Chief Complaint:  Pancreas carcinoma  Referring Physician(s): Finnegan,Timothy J  History of Present Illness: Veronica Colon is a 86 y.o. female with a stage IV pancreas carcinoma.  Patient is here today for portacatheter placement to receive chemotherapy.  Overall she feels well.  No recent illness or fever.  Stable appetite and weight.  No current abdominal pain.  Review of systems negative.  Past Medical History:  Diagnosis Date  . Allergy   . Anxiety and depression   . Anxiety and depression   . Arthritis   . Breast cancer ([place]) 1990   LT MASTECTOMY  . Cancer ([place]) 1990   LT BREAST MASTECTOMY  . CKD (chronic kidney disease), stage III ([place])   . Diabetes mellitus without complication ([place])   . GERD (gastroesophageal reflux disease)   . Hypertension   . Hypertensive CKD (chronic kidney disease)   . Personal history of chemotherapy   . Status post chemotherapy    breast cancer  . Temporal arteritis ([place]) 10/28/2016   Overview:  Right temporal arteritis  Biopsy proven 11/08/2016    Past Surgical History:  Procedure Laterality Date  . ABDOMINAL HYSTERECTOMY    . BREAST SURGERY    . COLONOSCOPY WITH PROPOFOL N/A 06/04/2015   Procedure: COLONOSCOPY WITH PROPOFOL;  Surgeon: Manya Silvas, MD;  Location[facility] ENDOSCOPY;  Service: Endoscopy;  Laterality: N/A;  . MASTECTOMY Left 1990   BREAST CA  . TEMPORAL ARTERY BIOPSY / LIGATION  11/2016    Allergies: Hydrochlorothiazide, Lisinopril, and Metformin and related  Medications: Prior to Admission medications   Medication Sig Start Date End Date Taking? Authorizing Provider  acetaminophen (TYLENOL) 325 MG tablet Take 650 mg by mouth every 6 (six) hours as needed for mild pain.   Yes [provider]  aspirin 81 MG tablet Take 81 mg by mouth daily.   Yes [provider]  Multiple Vitamin (MULTIVITAMIN) tablet Take 1 tablet by mouth daily.   Yes [provider]  OS-CAL CALCIUM + D3 500-200  MG-UNIT TABS Take 1 tablet by mouth 2 (two) times daily with a meal. 10/25/17  Yes [provider]  ascorbic acid (VITAMIN C) 1000 MG tablet Take by mouth.    [provider]  fluticasone (FLONASE) 50 MCG/ACT nasal spray Place 2 sprays into both nostrils daily. 08/01/18   Arnetha Courser, MD  lidocaine-prilocaine (EMLA) cream Apply to affected area once 05/01/20   Lloyd Huger, MD  prochlorperazine (COMPAZINE) 10 MG tablet Take 1 tablet (10 mg total) by mouth every 6 (six) hours as needed (Nausea or vomiting). 05/01/20   Lloyd Huger, MD  spironolactone (ALDACTONE) 50 MG tablet TAKE 1 TABLET BY MOUTH EVERY DAY 12/06/19   Delsa Grana, PA-C     Family History  Problem Relation Age of Onset  . Breast cancer Mother 100  . Cancer Mother        breast  . Cancer Father        lung    Social History   Socioeconomic History  . Marital status: Widowed    Spouse name: Not on file  . Number of children: 1  . Years of education: 39  . Highest education level: Not on file  Occupational History  . Occupation: retired  Tobacco Use  . Smoking status: Never Smoker  . Smokeless tobacco: Never Used  Vaping Use  . Vaping Use: Never used  Substance and Sexual Activity  . Alcohol use: No    Alcohol/week:  0.0 standard drinks  . Drug use: No  . Sexual activity: Not Currently  Other Topics Concern  . Not on file  Social History Narrative   Pt lives with her daughter   Social Determinants of Health   Financial Resource Strain: Low Risk   . Difficulty of Paying Living Expenses: Not hard at all  Food Insecurity: No Food Insecurity  . Worried About Charity fundraiser in the Last Year: Never true  . Ran Out of Food in the Last Year: Never true  Transportation Needs: No Transportation Needs  . Lack of Transportation (Medical): No  . Lack of Transportation (Non-Medical): No  Physical Activity: Inactive  . Days of Exercise per Week: 0 days  . Minutes of Exercise per  Session: 0 min  Stress: No Stress Concern Present  . Feeling of Stress : Not at all  Social Connections: Moderately Integrated  . Frequency of Communication with Friends and Family: More than three times a week  . Frequency of Social Gatherings with Friends and Family: Twice a week  . Attends Religious Services: More than 4 times per year  . Active Member of Clubs or Organizations: Yes  . Attends Archivist Meetings: More than 4 times per year  . Marital Status: Divorced    ECOG Status: 0 - Asymptomatic  Review of Systems: A 12 point ROS discussed and pertinent positives are indicated in the HPI above.  All other systems are negative.  Review of Systems  Vital Signs: BP (!) 151/60   Pulse (!) 59   Temp 98.4 F (36.9 C) (Oral)   Resp 18   Ht 5\' 4"  (1.626 m)   Wt 75.8 kg   LMP  (LMP Unknown)   SpO2 98%   BMI 28.67 kg/m   Physical Exam Constitutional:      General: She is not in acute distress.    Appearance: She is not toxic-appearing.  Eyes:     General: Scleral icterus present.     Conjunctiva/sclera: Conjunctivae normal.  Cardiovascular:     Rate and Rhythm: Normal rate and regular rhythm.     Heart sounds: Murmur heard.   Pulmonary:     Effort: Pulmonary effort is normal.     Breath sounds: Normal breath sounds.  Abdominal:     General: Bowel sounds are normal.     Palpations: Abdomen is soft.  Neurological:     General: No focal deficit present.     Mental Status: Mental status is at baseline.  Psychiatric:        Mood and Affect: Mood normal.     Imaging: NM PET Image Initial (PI) Skull Base To Thigh  Result Date: 04/17/2020 CLINICAL DATA:  Initial treatment strategy for pancreatic mass. EXAM: NUCLEAR MEDICINE PET SKULL BASE TO THIGH TECHNIQUE: 9.6 mCi F-18 FDG was injected intravenously. Full-ring PET imaging was performed from the skull base to thigh after the radiotracer. CT data was obtained and used for attenuation correction and anatomic  localization. Fasting blood glucose: 89 mg/dl COMPARISON:  Multiple exams, including CT abdomen from 04/09/2020 FINDINGS: Mediastinal blood pool activity: SUV max 3.5 Liver activity: SUV max NA NECK: No significant abnormal hypermetabolic activity in this region. Incidental CT findings: Mild left common carotid atherosclerotic calcification. CHEST: No significant abnormal hypermetabolic activity in this region. Incidental CT findings: Coronary, aortic arch, and branch vessel atherosclerotic vascular disease. Mild cardiomegaly. Left mastectomy. ABDOMEN/PELVIS: Infiltrative mass of the pancreatic tail and body shown on recent CT has  a maximum SUV of 7.7, high suspicion for pancreatic adenocarcinoma. Dilated dorsal pancreatic duct distal to the mass. Nondistention of the stomach probably accounts for the wall thickening in the stomach wall proximally given the lack of associated significant hypermetabolic activity. Hypermetabolic omental caking of tumor. A representative region of involvement in the left abdomen has a maximum SUV of 8.8. Left para-aortic tumor just anterior to the left ureter is likely causing some extrinsic compression of the ureter. This measures 2.6 by 1.3 cm and has a maximum SUV of about 6.6. Incidental CT findings: Mild left hydronephrosis and proximal hydroureter. Aortoiliac atherosclerotic vascular disease. Small amount of pelvic ascites, possibly malignant. SKELETON: No significant abnormal hypermetabolic activity in this region. A 0.8 cm lucent lesion in the T12 vertebral body does not have appreciable associated hypermetabolic activity, and was present on 07/12/2011, hence is likely a small hemangioma or similar benign lesion. Incidental CT findings: Grade 2 anterolisthesis at L4-5 due to prominent degenerative facet arthropathy. Given the degree of facet arthropathy, I suspect that the low-grade activity in the right facet joint is due to arthropathy rather than malignancy. Thoracic  spondylosis. IMPRESSION: 1. Hypermetabolic pancreatic mass favoring pancreatic adenocarcinoma, with hypermetabolic omental caking of tumor and a small amount of pelvic ascites which may be malignant. 2. No definite hypermetabolic activity in the stomach wall, wall thickening is probably from nondistention. 3. Hypermetabolic focus of tumor to the left of the aorta adjacent to the left ureter is causing left hydronephrosis and hydroureter, potentially from extrinsic compression or ureteral invasion. 4. Other imaging findings of potential clinical significance: Aortic Atherosclerosis (ICD10-I70.0). Prominent facet arthropathy at L4-5 with grade 2 anterolisthesis. Coronary atherosclerosis. Mild cardiomegaly. Electronically Signed   By: Van Clines M.D.   On: 04/17/2020 16:53   CT Biopsy  Result Date: 04/24/2020 INDICATION: 86 year old female with recently diagnosed pancreatic mass and masslike omental thickening. EXAM: CT BIOPSY COMPARISON:  PET-CT from 04/17/2020 MEDICATIONS: None. ANESTHESIA/SEDATION: Fentanyl 50 mcg IV; Versed 1 mg IV Sedation time: 10 minutes; The patient was continuously monitored during the procedure by the interventional radiology nurse under my direct supervision. CONTRAST:  None. COMPLICATIONS: None immediate. PROCEDURE: Informed consent was obtained from the patient following an explanation of the procedure, risks, benefits and alternatives. A time out was performed prior to the initiation of the procedure. The patient was positioned supine on the CT table and a limited CT was performed for procedural planning demonstrating similar appearing omental thickening in the left anterior abdomen. The procedure was planned. The operative site was prepped and draped in the usual sterile fashion. Appropriate trajectory was confirmed with a 22 gauge spinal needle after the adjacent tissues were anesthetized with 1% Lidocaine with epinephrine. Under intermittent CT guidance, a 17 gauge coaxial  needle was advanced into the peripheral aspect of the mass. Appropriate positioning was confirmed and a total of 4 samples were obtained with an 18 gauge core needle biopsy device. The co-axial needle was removed and hemostasis was achieved with manual compression. A limited postprocedural CT was negative for hemorrhage or additional complication. A dressing was placed. The patient tolerated the procedure well without immediate postprocedural complication. IMPRESSION: Technically successful CT guided core needle biopsy of left anterior omental mass. Ruthann Cancer, MD [facility] Electronically Signed   By: Ruthann Cancer MD   On: 04/24/2020 13:43   CT HEMATURIA WORKUP  Result Date: 04/09/2020 CLINICAL DATA:  Microscopic hematuria, history of LEFT breast cancer and colon cancer by report.  EXAM: CT ABDOMEN AND PELVIS WITHOUT AND WITH CONTRAST TECHNIQUE: Multidetector CT imaging of the abdomen and pelvis was performed following the standard protocol before and following the bolus administration of intravenous contrast. CONTRAST:  192mL OMNIPAQUE IOHEXOL 300 MG/ML  SOLN COMPARISON:  CT of the chest abdomen and pelvis from 20/12, no recent comparisons are available FINDINGS: Lower chest: Lung bases are clear.  No effusion.  No consolidation. Hepatobiliary: Liver without focal lesion. The trace ascites in the setting of peritoneal carcinomatosis. Mildly nodular appearance of the RIGHT posterior hepatic margin in more since pouch. No pericholecystic stranding. No biliary duct dilation. Pancreas: Mass in the pancreas measuring 5 x 2.7 cm with peripheral ductal dilation and parenchymal atrophy. Soft tissue occluding the SMV. Main portal vein while patent into the liver is surrounded by areas of cavernous transformation likely reconstituted flow in the setting of cavernous transformation about the pancreatic head. Splenic vein is occluded. Celiac axis is case. Soft  tissue involvement of the LEFT adrenal. Signs of diffuse omental and peritoneal carcinomatosis. For instance on image 37 of series 4 is a 4.2 x 1.9 cm area peritoneal nodularity. Small volume ascites. Omental nodularity and nodularity extending through a fat containing umbilical hernia best seen on image 42 of series 4 where there is a 1 cm nodule in the body wall with adjacent omental caking on image 43 measuring 4.3 x 1.4 cm. Spleen: Spleen normal in size and contour. Adrenals/Urinary Tract: LEFT adrenal with soft tissue. LEFT sided hydronephrosis due to obstructing soft tissue mass in the LEFT retroperitoneum measuring 2.0 x 1.6 cm involving the mid LEFT ureter. Delayed enhancement of the LEFT kidney. Normal enhancement of the RIGHT kidney. Cortical loss on the LEFT implies some chronicity with respect to obstruction. Stomach/Bowel: Distal esophageal and gastric thickening with mural stratification of the gastric wall. Nodular thickening of the gastric fundus compatible with gastric involvement best seen on image 38 of series 10 the collateral pathways surround the stomach in the setting of SMV occlusion and splenic vein occlusion. Small bowel without signs of obstruction. Implants in the mesentery of the small bowel best seen on image 26 of series 10, coronal data set in the jejunal mesentery where there is a 14 mm mesenteric implant. Nodular area in the RIGHT lower quadrant small bowel mesentery approximately 8 mm adjacent to ileal loops (image 46 of series 4. Signs of previous RIGHT colectomy.  No acute colonic process Vascular/Lymphatic: Calcified and noncalcified atheromatous plaque of the abdominal aorta. No aneurysmal dilation no discrete adenopathy with LEFT retroperitoneal soft tissue obstructing LEFT ureter as outlined above. No pelvic sidewall lymphadenopathy. Reproductive: Small volume pelvic ascites. Heterogeneity of the bilateral adnexa without discrete mass lesion. Ascites tracks into the pelvis  with evidence of fascial thickening presumably related to peritoneal disease. Other: Peritoneal disease as discussed. Musculoskeletal: Subtle lucency at T12 measuring approximately cm just to the RIGHT of midline likely a metastatic focus. Spinal degenerative changes. No additional suspicious bone lesion. Excretory phase imaging limited on the LEFT due to obstruction of the mid LEFT ureter. The RIGHT-sided excretion is limited, dilute but without discrete focus of abnormality. IMPRESSION: 1. 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 Suspicious for primary pancreatic neoplasm, confounded by the presence of potential linitd plastica and gastric thickening in the fundus. Metastatic disease from breast cancer could account for these findings as well. Endoscopic assessment and biopsy is suggested. 2. Vascular encasement in the setting of pancreatic mass occluding splenic vein, SMV and encasing celiac axis  with resultant collateral pathways in the upper abdomen as discussed. 3. LEFT sided hydronephrosis due to obstructing soft tissue mass in the LEFT retroperitoneum measuring 2.0 x 1.6 cm involving the mid LEFT ureter. This results in delayed enhancement and excretion of the LEFT kidney and with cortical thinning that is compatible with a chronic process. 4. Subtle lucency at T12 measuring approximately 1 cm just to the RIGHT of midline likely a metastatic focus. 5. Aortic atherosclerosis. These results will be called to the ordering clinician or representative by the Radiologist Assistant, and communication documented in the PACS or [employer]. Aortic Atherosclerosis (ICD10-I70.0). Electronically Signed   By: Zetta Bills M.D.   On: 04/09/2020 15:59    Labs:  CBC: Recent Labs    11/17/19 1407 04/07/20 1221 04/24/20 1031  WBC 8.1 6.0 6.3  HGB 14.0 12.4 12.5  HCT 43.1 38.0 39.2  PLT 192 174 174    COAGS: Recent Labs    04/24/20 1031  INR 1.0    BMP: Recent Labs     01/07/20 0938 03/24/20 1008 04/07/20 1221 04/11/20 0858  NA 139 140 137 139  K 4.3 3.9 4.1 4.0  CL 105 102 102 104  CO2 25 21 25 24   GLUCOSE 112* 124* 107* 134*  BUN 28* 21 23 22   CALCIUM 9.9 9.7 9.9 9.7  CREATININE 1.74* 1.45* 1.73* 1.51*  GFRNONAA 26* 33* 26* 31*  GFRAA 30* 38* 30* 36*    LIVER FUNCTION TESTS: Recent Labs    05/25/19 0000 01/07/20 0938 04/11/20 0858  BILITOT 0.7 0.4 0.7  AST 23 25 26   ALT 19 18 19   ALKPHOS  --   --  62  PROT 5.7* 6.3 7.0  ALBUMIN  --   --  4.0    TUMOR MARKERS: No results for input(s): AFPTM, CEA, CA199, CHROMGRNA in the last 8760 hours.  Assessment and Plan:  Stage IV pancreas carcinoma following recent omental biopsy results.  Plan for port catheter placement for palliative chemotherapy.  Consent obtained.  Risks and benefits of image guided port-a-catheter placement was discussed with the patient including, but not limited to bleeding, infection, pneumothorax, or fibrin sheath development and need for additional procedures.  All of the patient's questions were answered, patient is agreeable to proceed. Consent signed and in chart.    Thank you for this interesting consult.  I greatly enjoyed meeting Jerra Huckeby Gaskill and look forward to participating in their care.  A copy of this report was sent to the requesting provider on this date.  Electronically Signed: Greggory Keen, MD 05/09/2020, 9:45 AM   I spent a total of  30 Minutes   in face to face in clinical consultation, greater than 50% of which was counseling/coordinating care for this pt.

## 2020-05-10 NOTE — Progress Notes (Signed)
Veronica Colon  [phone] [phone]) [phone]  ID: Virtie Bungert Papaleo OB: [birth year]  MR#: [id number]  YWV#:[id number]  Patient Care Team: Delsa Grana, PA-C as PCP - General (Family Medicine) Marlowe Sax, MD as Referring Physician (Internal Medicine) Birder Robson, MD as Referring Physician (Ophthalmology)  CHIEF COMPLAINT: Stage IV pancreatic cancer.  INTERVAL HISTORY: Patient returns to clinic today for further evaluation and initiation of cycle 1, day 1 of single agent gemcitabine.  She continues to have mild abdominal bloating and discomfort, but otherwise feels well. He has no neurologic complaints.  She denies any recent fevers or illnesses.  She has a good appetite and denies weight loss.  She denies any pain.  She has no chest pain, shortness of breath, cough, or hemoptysis.  She denies any nausea, vomiting, or diarrhea.  She has noted no changes in her bowel movements and denies any melena or hematochezia.  She has no urinary complaints.  Patient offers no further specific complaints today.  REVIEW OF SYSTEMS:   Review of Systems  Constitutional: Negative.  Negative for fever, malaise/fatigue and weight loss.  Respiratory: Negative.  Negative for cough and hemoptysis.   Cardiovascular: Negative.  Negative for chest pain and leg swelling.  Gastrointestinal: Positive for constipation. Negative for abdominal pain, blood in stool and melena.  Genitourinary: Negative.  Negative for dysuria.  Musculoskeletal: Negative.  Negative for back pain.  Skin: Negative.  Negative for rash.  Neurological: Negative.  Negative for dizziness, focal weakness, weakness and headaches.  Psychiatric/Behavioral: Negative.  The patient is not nervous/anxious.     As per HPI. Otherwise, a complete review of systems is negative.  PAST MEDICAL HISTORY: Past Medical History:  Diagnosis Date  . Allergy   . Anxiety and depression   . Anxiety and depression   .  Arthritis   . Breast cancer ([place]) 1990   LT MASTECTOMY  . Cancer ([place]) 1990   LT BREAST MASTECTOMY  . CKD (chronic kidney disease), stage III ([place])   . Diabetes mellitus without complication ([place])   . GERD (gastroesophageal reflux disease)   . Hypertension   . Hypertensive CKD (chronic kidney disease)   . Personal history of chemotherapy   . Status post chemotherapy    breast cancer  . Temporal arteritis ([place]) 10/28/2016   Overview:  Right temporal arteritis  Biopsy proven 11/08/2016    PAST SURGICAL HISTORY: Past Surgical History:  Procedure Laterality Date  . ABDOMINAL HYSTERECTOMY    . BREAST SURGERY    . COLONOSCOPY WITH PROPOFOL N/A 06/04/2015   Procedure: COLONOSCOPY WITH PROPOFOL;  Surgeon: Manya Silvas, MD;  Location: [facility] ENDOSCOPY;  Service: Endoscopy;  Laterality: N/A;  . IR IMAGING GUIDED PORT INSERTION  05/09/2020  . MASTECTOMY Left 1990   BREAST CA  . TEMPORAL ARTERY BIOPSY / LIGATION  11/2016    FAMILY HISTORY: Family History  Problem Relation Age of Onset  . Breast cancer Mother 44  . Cancer Mother        breast  . Cancer Father        lung    ADVANCED DIRECTIVES (Y/N):  N  HEALTH MAINTENANCE: Social History   Tobacco Use  . Smoking status: Never Smoker  . Smokeless tobacco: Never Used  Vaping Use  . Vaping Use: Never used  Substance Use Topics  . Alcohol use: No    Alcohol/week: 0.0 standard drinks  . Drug use: No     Colonoscopy:  PAP:  Bone density:  Lipid  panel:  Allergies  Allergen Reactions  . Hydrochlorothiazide Other (See Comments)    Hypokalemia   . Lisinopril Other (See Comments) and Cough    Hands numb   . Metformin And Related     Fatigue, "heart was beating slower"    Current Outpatient Medications  Medication Sig Dispense Refill  . acetaminophen (TYLENOL) 325 MG tablet Take 650 mg by mouth every 6 (six) hours as needed for mild pain.    Marland Kitchen ascorbic acid (VITAMIN C) 1000 MG tablet Take by mouth.    Marland Kitchen aspirin  81 MG tablet Take 81 mg by mouth daily.    . fluticasone (FLONASE) 50 MCG/ACT nasal spray Place 2 sprays into both nostrils daily. 48 g 3  . lidocaine-prilocaine (EMLA) cream Apply to affected area once 30 g 3  . Multiple Vitamin (MULTIVITAMIN) tablet Take 1 tablet by mouth daily.    Dellia Nims CALCIUM + D3 500-200 MG-UNIT TABS Take 1 tablet by mouth 2 (two) times daily with a meal.  11  . prochlorperazine (COMPAZINE) 10 MG tablet Take 1 tablet (10 mg total) by mouth every 6 (six) hours as needed (Nausea or vomiting). 60 tablet 3  . spironolactone (ALDACTONE) 50 MG tablet TAKE 1 TABLET BY MOUTH EVERY DAY 90 tablet 3   No current facility-administered medications for this visit.   Facility-Administered Medications Ordered in Other Visits  Medication Dose Route Frequency Provider Last Rate Last Admin  . heparin lock flush 100 unit/mL  500 Units Intracatheter Once PRN Lloyd Huger, MD        OBJECTIVE: Vitals:   05/12/20 0926  BP: (!) 143/70  Pulse: 64  Temp: (!) 97 F (36.1 C)  SpO2: 100%     Body mass index is 28.15 kg/m.    ECOG FS:0 - Asymptomatic  General: Well-developed, well-nourished, no acute distress. Eyes: Pink conjunctiva, anicteric sclera. HEENT: Normocephalic, moist mucous membranes. Lungs: No audible wheezing or coughing. Heart: Regular rate and rhythm. Abdomen: Soft, nontender, no obvious distention. Musculoskeletal: No edema, cyanosis, or clubbing. Neuro: Alert, answering all questions appropriately. Cranial nerves grossly intact. Skin: No rashes or petechiae noted. Psych: Normal affect.   LAB RESULTS:  Lab Results  Component Value Date   NA 136 05/12/2020   K 3.9 05/12/2020   CL 104 05/12/2020   CO2 24 05/12/2020   GLUCOSE 174 (H) 05/12/2020   BUN 18 05/12/2020   CREATININE 1.52 (H) 05/12/2020   CALCIUM 9.7 05/12/2020   PROT 7.0 05/12/2020   ALBUMIN 4.0 05/12/2020   AST 22 05/12/2020   ALT 10 05/12/2020   ALKPHOS 61 05/12/2020   BILITOT 0.7  05/12/2020   GFRNONAA 33 (L) 05/12/2020   GFRAA 36 (L) 04/11/2020    Lab Results  Component Value Date   WBC 6.8 05/12/2020   NEUTROABS 4.9 05/12/2020   HGB 11.7 (L) 05/12/2020   HCT 36.3 05/12/2020   MCV 85.2 05/12/2020   PLT 169 05/12/2020     STUDIES: NM PET Image Initial (PI) Skull Base To Thigh  Result Date: 04/17/2020 CLINICAL DATA:  Initial treatment strategy for pancreatic mass. EXAM: NUCLEAR MEDICINE PET SKULL BASE TO THIGH TECHNIQUE: 9.6 mCi F-18 FDG was injected intravenously. Full-ring PET imaging was performed from the skull base to thigh after the radiotracer. CT data was obtained and used for attenuation correction and anatomic localization. Fasting blood glucose: 89 mg/dl COMPARISON:  Multiple exams, including CT abdomen from 04/09/2020 FINDINGS: Mediastinal blood pool activity: SUV max 3.5 Liver activity: SUV  max NA NECK: No significant abnormal hypermetabolic activity in this region. Incidental CT findings: Mild left common carotid atherosclerotic calcification. CHEST: No significant abnormal hypermetabolic activity in this region. Incidental CT findings: Coronary, aortic arch, and branch vessel atherosclerotic vascular disease. Mild cardiomegaly. Left mastectomy. ABDOMEN/PELVIS: Infiltrative mass of the pancreatic tail and body shown on recent CT has a maximum SUV of 7.7, high suspicion for pancreatic adenocarcinoma. Dilated dorsal pancreatic duct distal to the mass. Nondistention of the stomach probably accounts for the wall thickening in the stomach wall proximally given the lack of associated significant hypermetabolic activity. Hypermetabolic omental caking of tumor. A representative region of involvement in the left abdomen has a maximum SUV of 8.8. Left para-aortic tumor just anterior to the left ureter is likely causing some extrinsic compression of the ureter. This measures 2.6 by 1.3 cm and has a maximum SUV of about 6.6. Incidental CT findings: Mild left hydronephrosis  and proximal hydroureter. Aortoiliac atherosclerotic vascular disease. Small amount of pelvic ascites, possibly malignant. SKELETON: No significant abnormal hypermetabolic activity in this region. A 0.8 cm lucent lesion in the T12 vertebral body does not have appreciable associated hypermetabolic activity, and was present on 07/12/2011, hence is likely a small hemangioma or similar benign lesion. Incidental CT findings: Grade 2 anterolisthesis at L4-5 due to prominent degenerative facet arthropathy. Given the degree of facet arthropathy, I suspect that the low-grade activity in the right facet joint is due to arthropathy rather than malignancy. Thoracic spondylosis. IMPRESSION: 1. Hypermetabolic pancreatic mass favoring pancreatic adenocarcinoma, with hypermetabolic omental caking of tumor and a small amount of pelvic ascites which may be malignant. 2. No definite hypermetabolic activity in the stomach wall, wall thickening is probably from nondistention. 3. Hypermetabolic focus of tumor to the left of the aorta adjacent to the left ureter is causing left hydronephrosis and hydroureter, potentially from extrinsic compression or ureteral invasion. 4. Other imaging findings of potential clinical significance: Aortic Atherosclerosis (ICD10-I70.0). Prominent facet arthropathy at L4-5 with grade 2 anterolisthesis. Coronary atherosclerosis. Mild cardiomegaly. Electronically Signed   By: Van Clines M.D.   On: 04/17/2020 16:53   CT Biopsy  Result Date: 04/24/2020 INDICATION: 86 year old female with recently diagnosed pancreatic mass and masslike omental thickening. EXAM: CT BIOPSY COMPARISON:  PET-CT from 04/17/2020 MEDICATIONS: None. ANESTHESIA/SEDATION: Fentanyl 50 mcg IV; Versed 1 mg IV Sedation time: 10 minutes; The patient was continuously monitored during the procedure by the interventional radiology nurse under my direct supervision. CONTRAST:  None. COMPLICATIONS: None immediate. PROCEDURE: Informed  consent was obtained from the patient following an explanation of the procedure, risks, benefits and alternatives. A time out was performed prior to the initiation of the procedure. The patient was positioned supine on the CT table and a limited CT was performed for procedural planning demonstrating similar appearing omental thickening in the left anterior abdomen. The procedure was planned. The operative site was prepped and draped in the usual sterile fashion. Appropriate trajectory was confirmed with a 22 gauge spinal needle after the adjacent tissues were anesthetized with 1% Lidocaine with epinephrine. Under intermittent CT guidance, a 17 gauge coaxial needle was advanced into the peripheral aspect of the mass. Appropriate positioning was confirmed and a total of 4 samples were obtained with an 18 gauge core needle biopsy device. The co-axial needle was removed and hemostasis was achieved with manual compression. A limited postprocedural CT was negative for hemorrhage or additional complication. A dressing was placed. The patient tolerated the procedure well without immediate postprocedural complication. IMPRESSION: Technically  successful CT guided core needle biopsy of left anterior omental mass. Ruthann Cancer, MD [facility] Electronically Signed   By: Ruthann Cancer MD   On: 04/24/2020 13:43   IR IMAGING GUIDED PORT INSERTION  Result Date: 05/09/2020 CLINICAL DATA:  Stage IV pancreas cancer, access for chemotherapy EXAM: RIGHT INTERNAL JUGULAR SINGLE LUMEN POWER PORT CATHETER INSERTION Date:  05/09/2020 05/09/2020 11:01 am Radiologist:  M. Daryll Brod, MD Guidance:  Ultrasound and fluoroscopic MEDICATIONS: Ancef 2 g; The antibiotic was administered within an appropriate time interval prior to skin puncture. ANESTHESIA/SEDATION: Versed 1 mg IV; Fentanyl 50 mcg IV; Moderate Sedation Time:  30 minutes The patient was continuously monitored during  the procedure by the interventional radiology nurse under my direct supervision. FLUOROSCOPY TIME:  0 minutes, 36 seconds (5 mGy) COMPLICATIONS: None immediate. CONTRAST:  None. PROCEDURE: Informed consent was obtained from the patient following explanation of the procedure, risks, benefits and alternatives. The patient understands, agrees and consents for the procedure. All questions were addressed. A time out was performed. Maximal barrier sterile technique utilized including caps, mask, sterile gowns, sterile gloves, large sterile drape, hand hygiene, and 2% chlorhexidine scrub. Under sterile conditions and local anesthesia, right internal jugular micropuncture venous access was performed. Access was performed with ultrasound. Images were obtained for documentation of the patent right internal jugular vein. A guide wire was inserted followed by a transitional dilator. This allowed insertion of a guide wire and catheter into the IVC. Measurements were obtained from the SVC / RA junction back to the right IJ venotomy site. In the right infraclavicular chest, a subcutaneous pocket was created over the second anterior rib. This was done under sterile conditions and local anesthesia. 1% lidocaine with epinephrine was utilized for this. A 2.5 cm incision was made in the skin. Blunt dissection was performed to create a subcutaneous pocket over the right pectoralis major muscle. The pocket was flushed with saline vigorously. There was adequate hemostasis. The port catheter was assembled and checked for leakage. The port catheter was secured in the pocket with two retention sutures. The tubing was tunneled subcutaneously to the right venotomy site and inserted into the SVC/RA junction through a valved peel-away sheath. Position was confirmed with fluoroscopy. Images were obtained for documentation. The patient tolerated the procedure well. No immediate complications. Incisions were closed in a two layer fashion with 4 - 0  Vicryl suture. Dermabond was applied to the skin. The port catheter was accessed, blood was aspirated followed by saline and heparin flushes. Needle was removed. A dry sterile dressing was applied. IMPRESSION: Ultrasound and fluoroscopically guided right internal jugular single lumen power port catheter insertion. Tip in the SVC/RA junction. Catheter ready for use. Electronically Signed   By: Jerilynn Mages.  Shick M.D.   On: 05/09/2020 11:10    ASSESSMENT: Stage IV pancreatic cancer.  PLAN:   1.  Stage IV pancreatic cancer: Omental biopsy confirmed results.  PET scans results from April 09, 2020 reviewed independently and reported as above confirming stage of disease.  Patient CA 19-9 is also significantly elevated at 19,661.  After lengthy discussion with the patient, she wishes to pursue palliative chemotherapy using single agent gemcitabine on days 1, 8, 15 of a 28-day cycle.  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  We once again discussed the possibility of adding in capecitabine, but will reconsider in the future if patient wishes to pursue a more aggressive stance.  Patient has had port placement.  Proceed with cycle 1, day 1 of treatment today.  Return to clinic in 1 week for further evaluation and consideration of cycle 1, day 8. 2.  MGUS: Patient's most recent M spike is only 0.2.  This is likely clinically insignificant.  No further work-up is necessary. 3.  History of colon cancer: Patient had stage I colon cancer 7 or 8 years ago.   4.  History of breast cancer: 31 years ago.  Unclear stage or type of malignancy. 5.  Renal insufficiency: Possibly secondary to obstruction from omental caking.  Creatinine is 1.52 today.  Monitor.   Patient expressed understanding and was in agreement with this plan. She also understands that She can call clinic at any time with any questions, concerns, or complaints.    Lloyd Huger, MD   05/12/2020 11:04 AM

## 2020-05-12 ENCOUNTER — Encounter: Payer: Self-pay | Admitting: Oncology

## 2020-05-12 ENCOUNTER — Inpatient Hospital Stay (HOSPITAL_BASED_OUTPATIENT_CLINIC_OR_DEPARTMENT_OTHER): Payer: Medicare Other | Admitting: Oncology

## 2020-05-12 ENCOUNTER — Inpatient Hospital Stay: Payer: Medicare Other | Attending: Oncology

## 2020-05-12 ENCOUNTER — Inpatient Hospital Stay: Payer: Medicare Other | Admitting: Licensed Clinical Social Worker

## 2020-05-12 ENCOUNTER — Inpatient Hospital Stay: Payer: Medicare Other

## 2020-05-12 ENCOUNTER — Encounter: Payer: Self-pay | Admitting: Licensed Clinical Social Worker

## 2020-05-12 ENCOUNTER — Other Ambulatory Visit: Payer: Self-pay

## 2020-05-12 VITALS — BP 143/70 | HR 64 | Temp 97.0°F | Wt 164.0 lb

## 2020-05-12 DIAGNOSIS — C259 Malignant neoplasm of pancreas, unspecified: Secondary | ICD-10-CM

## 2020-05-12 DIAGNOSIS — D472 Monoclonal gammopathy: Secondary | ICD-10-CM | POA: Insufficient documentation

## 2020-05-12 DIAGNOSIS — E1122 Type 2 diabetes mellitus with diabetic chronic kidney disease: Secondary | ICD-10-CM | POA: Diagnosis not present

## 2020-05-12 DIAGNOSIS — Z801 Family history of malignant neoplasm of trachea, bronchus and lung: Secondary | ICD-10-CM | POA: Insufficient documentation

## 2020-05-12 DIAGNOSIS — K219 Gastro-esophageal reflux disease without esophagitis: Secondary | ICD-10-CM | POA: Diagnosis not present

## 2020-05-12 DIAGNOSIS — N183 Chronic kidney disease, stage 3 unspecified: Secondary | ICD-10-CM | POA: Insufficient documentation

## 2020-05-12 DIAGNOSIS — Z800 Family history of malignant neoplasm of digestive organs: Secondary | ICD-10-CM | POA: Insufficient documentation

## 2020-05-12 DIAGNOSIS — C786 Secondary malignant neoplasm of retroperitoneum and peritoneum: Secondary | ICD-10-CM | POA: Diagnosis not present

## 2020-05-12 DIAGNOSIS — Z85038 Personal history of other malignant neoplasm of large intestine: Secondary | ICD-10-CM

## 2020-05-12 DIAGNOSIS — Z803 Family history of malignant neoplasm of breast: Secondary | ICD-10-CM | POA: Insufficient documentation

## 2020-05-12 DIAGNOSIS — I129 Hypertensive chronic kidney disease with stage 1 through stage 4 chronic kidney disease, or unspecified chronic kidney disease: Secondary | ICD-10-CM | POA: Insufficient documentation

## 2020-05-12 DIAGNOSIS — D61818 Other pancytopenia: Secondary | ICD-10-CM | POA: Diagnosis not present

## 2020-05-12 DIAGNOSIS — K5900 Constipation, unspecified: Secondary | ICD-10-CM | POA: Insufficient documentation

## 2020-05-12 DIAGNOSIS — F418 Other specified anxiety disorders: Secondary | ICD-10-CM | POA: Insufficient documentation

## 2020-05-12 DIAGNOSIS — Z5111 Encounter for antineoplastic chemotherapy: Secondary | ICD-10-CM | POA: Diagnosis not present

## 2020-05-12 DIAGNOSIS — Z853 Personal history of malignant neoplasm of breast: Secondary | ICD-10-CM

## 2020-05-12 DIAGNOSIS — M129 Arthropathy, unspecified: Secondary | ICD-10-CM | POA: Diagnosis not present

## 2020-05-12 LAB — CBC WITH DIFFERENTIAL/PLATELET
Abs Immature Granulocytes: 0.01 10*3/uL (ref 0.00–0.07)
Basophils Absolute: 0 10*3/uL (ref 0.0–0.1)
Basophils Relative: 0 %
Eosinophils Absolute: 0.1 10*3/uL (ref 0.0–0.5)
Eosinophils Relative: 2 %
HCT: 36.3 % (ref 36.0–46.0)
Hemoglobin: 11.7 g/dL — ABNORMAL LOW (ref 12.0–15.0)
Immature Granulocytes: 0 %
Lymphocytes Relative: 18 %
Lymphs Abs: 1.2 10*3/uL (ref 0.7–4.0)
MCH: 27.5 pg (ref 26.0–34.0)
MCHC: 32.2 g/dL (ref 30.0–36.0)
MCV: 85.2 fL (ref 80.0–100.0)
Monocytes Absolute: 0.5 10*3/uL (ref 0.1–1.0)
Monocytes Relative: 8 %
Neutro Abs: 4.9 10*3/uL (ref 1.7–7.7)
Neutrophils Relative %: 72 %
Platelets: 169 10*3/uL (ref 150–400)
RBC: 4.26 MIL/uL (ref 3.87–5.11)
RDW: 14.1 % (ref 11.5–15.5)
WBC: 6.8 10*3/uL (ref 4.0–10.5)
nRBC: 0 % (ref 0.0–0.2)

## 2020-05-12 LAB — COMPREHENSIVE METABOLIC PANEL
ALT: 10 U/L (ref 0–44)
AST: 22 U/L (ref 15–41)
Albumin: 4 g/dL (ref 3.5–5.0)
Alkaline Phosphatase: 61 U/L (ref 38–126)
Anion gap: 8 (ref 5–15)
BUN: 18 mg/dL (ref 8–23)
CO2: 24 mmol/L (ref 22–32)
Calcium: 9.7 mg/dL (ref 8.9–10.3)
Chloride: 104 mmol/L (ref 98–111)
Creatinine, Ser: 1.52 mg/dL — ABNORMAL HIGH (ref 0.44–1.00)
GFR, Estimated: 33 mL/min — ABNORMAL LOW (ref >60)
Glucose, Bld: 174 mg/dL — ABNORMAL HIGH (ref 70–99)
Potassium: 3.9 mmol/L (ref 3.5–5.1)
Sodium: 136 mmol/L (ref 135–145)
Total Bilirubin: 0.7 mg/dL (ref 0.3–1.2)
Total Protein: 7 g/dL (ref 6.5–8.1)

## 2020-05-12 MED ORDER — HEPARIN SOD (PORK) LOCK FLUSH 100 UNIT/ML IV SOLN
500.0000 [IU] | Freq: Once | INTRAVENOUS | Status: AC | PRN
  Administered 2020-05-12: 500 [IU]
  Filled 2020-05-12: qty 5

## 2020-05-12 MED ORDER — PROCHLORPERAZINE MALEATE 10 MG PO TABS
10.0000 mg | ORAL_TABLET | Freq: Once | ORAL | Status: AC
  Administered 2020-05-12: 10 mg via ORAL
  Filled 2020-05-12: qty 1

## 2020-05-12 MED ORDER — HEPARIN SOD (PORK) LOCK FLUSH 100 UNIT/ML IV SOLN
INTRAVENOUS | Status: AC
  Filled 2020-05-12: qty 5

## 2020-05-12 MED ORDER — SODIUM CHLORIDE 0.9 % IV SOLN
Freq: Once | INTRAVENOUS | Status: AC
  Filled 2020-05-12: qty 250

## 2020-05-12 MED ORDER — SODIUM CHLORIDE 0.9 % IV SOLN
1800.0000 mg | Freq: Once | INTRAVENOUS | Status: AC
  Administered 2020-05-12: 1800 mg via INTRAVENOUS
  Filled 2020-05-12: qty 26.3

## 2020-05-12 NOTE — Progress Notes (Signed)
REFERRING PROVIDER: Verlon Au, NP Veronica Colon,  [place] [id number]  PRIMARY PROVIDER:  Delsa Grana, PA-C  PRIMARY REASON FOR VISIT:  1. H/O malignant neoplasm of breast   2. Family history of breast cancer   3. Family history of pancreatic cancer   4. H/O malignant neoplasm of colon   5. Pancreatic adenocarcinoma ([place])      HISTORY OF PRESENT ILLNESS:   Veronica Colon, a 86 y.o. female, was seen for a [facility] [place] cancer genetics consultation at the request of Beckey Rutter, NP due to a personal and family history of cancer.  Veronica Colon presents to clinic today to discuss the possibility of a hereditary predisposition to cancer, genetic testing, and to further clarify her future cancer risks, as well as potential cancer risks for family members.   At the age of 75, Veronica Colon was diagnosed with left breast cancer. This was treated with mastectomy and chemotherapy. In her 28s, Veronica Colon was diagnosed with colon cancer. In 2021, at the age of 41, Veronica Colon was diagnosed with pancreatic cancer.    CANCER HISTORY:  Oncology History  Pancreatic adenocarcinoma ([place])  05/01/2020 Initial Diagnosis   Pancreatic adenocarcinoma ([place])   05/12/2020 -  Chemotherapy   The patient had gemcitabine (GEMZAR) 1,800 mg in sodium chloride 0.9 % 250 mL chemo infusion, 1,824 mg, Intravenous,  Once, 1 of 6 cycles Administration: 1,800 mg (05/12/2020)  for chemotherapy treatment.       RISK FACTORS:  Menarche was at age 47.  First live birth at age 60.  Ovaries intact: yes.  Hysterectomy: yes.  Menopausal status: postmenopausal.   Past Medical History:  Diagnosis Date  . Allergy   . Anxiety and depression   . Anxiety and depression   . Arthritis   . Breast cancer ([place]) 1990   LT MASTECTOMY  . Cancer ([place]) 1990   LT BREAST MASTECTOMY  . CKD (chronic kidney disease), stage III ([place])   . Diabetes mellitus without complication ([place])   . Family history of breast cancer   . Family  history of pancreatic cancer   . GERD (gastroesophageal reflux disease)   . Hypertension   . Hypertensive CKD (chronic kidney disease)   . Personal history of chemotherapy   . Status post chemotherapy    breast cancer  . Temporal arteritis ([place]) 10/28/2016   Overview:  Right temporal arteritis  Biopsy proven 11/08/2016    Past Surgical History:  Procedure Laterality Date  . ABDOMINAL HYSTERECTOMY    . BREAST SURGERY    . COLONOSCOPY WITH PROPOFOL N/A 06/04/2015   Procedure: COLONOSCOPY WITH PROPOFOL;  Surgeon: Manya Silvas, MD;  Location[facility] ENDOSCOPY;  Service: Endoscopy;  Laterality: N/A;  . IR IMAGING GUIDED PORT INSERTION  05/09/2020  . MASTECTOMY Left 1990   BREAST CA  . TEMPORAL ARTERY BIOPSY / LIGATION  11/2016    Social History   Socioeconomic History  . Marital status: Widowed    Spouse name: Not on file  . Number of children: 1  . Years of education: 71  . Highest education level: Not on file  Occupational History  . Occupation: retired  Tobacco Use  . Smoking status: Never Smoker  . Smokeless tobacco: Never Used  Vaping Use  . Vaping Use: Never used  Substance and Sexual Activity  . Alcohol use: No    Alcohol/week: 0.0 standard drinks  . Drug use: No  . Sexual activity: Not Currently  Other Topics Concern  .  Not on file  Social History Narrative   Pt lives with her daughter   Social Determinants of Health   Financial Resource Strain: Low Risk   . Difficulty of Paying Living Expenses: Not hard at all  Food Insecurity: No Food Insecurity  . Worried About Charity fundraiser in the Last Year: Never true  . Ran Out of Food in the Last Year: Never true  Transportation Needs: No Transportation Needs  . Lack of Transportation (Medical): No  . Lack of Transportation (Non-Medical): No  Physical Activity: Inactive  . Days of Exercise per Week: 0 days  . Minutes of Exercise per Session: 0 min  Stress: No Stress Concern Present  . Feeling of Stress :  Not at all  Social Connections: Moderately Integrated  . Frequency of Communication with Friends and Family: More than three times a week  . Frequency of Social Gatherings with Friends and Family: Twice a week  . Attends Religious Services: More than 4 times per year  . Active Member of Clubs or Organizations: Yes  . Attends Archivist Meetings: More than 4 times per year  . Marital Status: Divorced     FAMILY HISTORY:  We obtained a detailed, 4-generation family history.  Significant diagnoses are listed below: Family History  Problem Relation Age of Onset  . Breast cancer Mother 44  . Lung cancer Father        lung  . Cancer Maternal Aunt        unk types  . Cancer Maternal Uncle        unk types  . Pancreatic cancer Cousin    Veronica Colon has one daughter, Veronica Colon, age 49 with no history of cancer. Patient has 2 sisters, no cancers, and 2 nephews and 1 niece.  Veronica Colon's mother had breast cancer at 15 and died at 71. Patient had 3 maternal aunts and 4 maternal uncles, she reports most of them had cancer but she is unsure the types. They all passed in their 68s. One maternal cousin did have pancreatic cancer. Maternal grandmother died in her 59s, grandfather died before patient was born.  Veronica Colon's father died at 33 and had history of lung cancer. Patient had 3 paternal uncles, 4 paternal aunts, no cancers. No known cancers in cousins or grandparents.  Veronica Colon is unaware of previous family history of genetic testing for hereditary cancer risks. There is no reported Ashkenazi Jewish ancestry. There is no known consanguinity.    GENETIC COUNSELING ASSESSMENT: Veronica Colon is a 86 y.o. female with a personal and family history of cancer which is somewhat suggestive of a hereditary cancer syndrome and predisposition to cancer. We, therefore, discussed and recommended the following at today's visit.   DISCUSSION: We discussed that approximately 5-10% of breast/pancreatic  cancer is hereditary  Most cases of hereditary pancreatic cancer are associated with BRCA1/BRCA2 genes, although there are other genes associated with hereditary pancreatic cancer as well. We discussed that testing is beneficial for several reasons including knowing if they are a candidate for targeted therapies,  knowing about other cancer risks, identifying potential screening and risk-reduction options that may be appropriate, and to understand if other family members could be at risk for cancer and allow them to undergo genetic testing.   We reviewed the characteristics, features and inheritance patterns of hereditary cancer syndromes. We also discussed genetic testing, including the appropriate family members to test, the process of testing, insurance coverage and turn-around-time for results. We  discussed the implications of a negative, positive and/or variant of uncertain significant result. We recommended Veronica Colon pursue genetic testing for the [facility] CancerNext+RNA gene panel.   Based on Veronica Colon's personal and family history of cancer, she meets medical criteria for genetic testing. Despite that she meets criteria, she may still have an out of pocket cost. We discussed that if her out of pocket cost for testing is over $100, the laboratory will call and confirm whether she wants to proceed with testing.  If the out of pocket cost of testing is less than $100 she will be billed by the genetic testing laboratory.   PLAN: After considering the risks, benefits, and limitations, Veronica Colon did not wish to pursue genetic testing at today's visit. We understand this decision and remain available to coordinate genetic testing at any time in the future. We, therefore, recommend Veronica Colon continue to follow the cancer screening guidelines given by her primary healthcare provider.  Based on Veronica Colon's family history, we recommended her maternal relatives have genetic counseling and testing. Veronica Colon  will let us know if we can be of any assistance in coordinating genetic counseling and/or testing for this family member.   Lastly, we encouraged Ms. Hessler to remain in contact with cancer genetics annually so that we can continuously update the family history and inform her of any changes in cancer genetics and testing that may be of benefit for this family.   Ms. Vanderberg's questions were answered to her satisfaction today. Our contact information was provided should additional questions or concerns arise. Thank you for the referral and allowing [place] to share in the care of your patient.   Faith Rogue, MS, [facility] Genetic Counselor [email or web address] Phone: [phone]  The patient was seen for a total of 25 minutes in face-to-face genetic counseling. Patient's daughter Veronica Colon was also present. Dr. Grayland Ormond was available for discussion regarding this case.   _______________________________________________________________________ For Office Staff:  Number of people involved in session: 2 Was an Intern/ student involved with case: no

## 2020-05-12 NOTE — Progress Notes (Signed)
Creatinine 1.52. Per Dr Grayland Ormond okay to proceed with treatment

## 2020-05-13 ENCOUNTER — Telehealth: Payer: Self-pay

## 2020-05-13 NOTE — Telephone Encounter (Signed)
Telephone call to patient for follow up after receiving first infusion.   Patient daughter states infusion went great.  States eating good and drinking plenty of fluids.   Denies any nausea or vomiting.  Encouraged patient to call for any concerns or questions. 

## 2020-05-14 ENCOUNTER — Encounter: Payer: Self-pay | Admitting: Nurse Practitioner

## 2020-05-14 ENCOUNTER — Telehealth: Payer: Self-pay | Admitting: *Deleted

## 2020-05-14 NOTE — Telephone Encounter (Signed)
Nurse called and spoke with patient's daughter.  States patient had noticed skin over port a cath site was reddened.  Denied any pain, warmth to touch or fever.  Nurse stated patient could come in today or tomorrow to have site checked by nurse practitioner. Patient wanted to wait and see how site looked tomorrow.  RN reviewed if site became painful or had increased redness to call and make appt to have site evaluated.  Caregiver and patient verbalized understanding.

## 2020-05-15 ENCOUNTER — Encounter: Payer: Self-pay | Admitting: Nurse Practitioner

## 2020-05-15 ENCOUNTER — Telehealth: Payer: Self-pay | Admitting: *Deleted

## 2020-05-15 ENCOUNTER — Inpatient Hospital Stay (HOSPITAL_BASED_OUTPATIENT_CLINIC_OR_DEPARTMENT_OTHER): Payer: Medicare Other | Admitting: Nurse Practitioner

## 2020-05-15 VITALS — BP 142/57 | HR 64 | Temp 98.0°F | Resp 20 | Wt 166.2 lb

## 2020-05-15 DIAGNOSIS — C259 Malignant neoplasm of pancreas, unspecified: Secondary | ICD-10-CM | POA: Diagnosis not present

## 2020-05-15 DIAGNOSIS — R238 Other skin changes: Secondary | ICD-10-CM

## 2020-05-15 NOTE — Telephone Encounter (Signed)
RN called and spoke with pt's daughter who requested pt be seen in clinic today for continued redness at port a cath site.  RN instructed daughter that the NP Beckey Rutter would see pt in symptom management clinic at 145pm today.  Daughter verbalized understanding.

## 2020-05-15 NOTE — Telephone Encounter (Signed)
Patient daughter called reporting that port site is still red and she would for the patient to be seen today for this. Please advise

## 2020-05-15 NOTE — Telephone Encounter (Signed)
Called daughter, pt coming in to be seen by Beckey Rutter at 145pm.

## 2020-05-16 NOTE — Progress Notes (Signed)
Symptom Management [facility]  [phone] Fax:([phone]) [phone]  Patient Care Team: Delsa Grana, PA-C as PCP - General (Family Medicine) Marlowe Sax, MD as Referring Physician (Internal Medicine) Birder Robson, MD as Referring Physician (Ophthalmology)   Name of the patient: Veronica Colon  [id number]  12/05/1931   Date of visit: 05/16/20  Diagnosis- stage IV pancreatic cancer  Chief complaint/ Reason for visit-skin irritation/infection  Heme/Onc history:  Oncology History  Pancreatic adenocarcinoma ([place])  05/01/2020 Initial Diagnosis   Pancreatic adenocarcinoma ([place])   05/12/2020 -  Chemotherapy   The patient had gemcitabine (GEMZAR) 1,800 mg in sodium chloride 0.9 % 250 mL chemo infusion, 1,824 mg, Intravenous,  Once, 1 of 6 cycles Administration: 1,800 mg (05/12/2020)  for chemotherapy treatment.      Interval history-patient presents to symptom management clinic for concerns of possible infection of her Port-A-Cath which was placed for administration of chemotherapy.  She says 2 to 3 days ago she noticed redness of the skin overlying her port.  Since that time it has improved but not resolved.  No pain or drainage from the area.  No wounds.  No fevers or chills.  Otherwise feels well.  Has not applied anything to the area.  Port was last accessed on 05/12/2020.  Review of systems- Review of Systems  Constitutional: Negative for chills, fever, malaise/fatigue and weight loss.  HENT: Negative for hearing loss, nosebleeds, sore throat and tinnitus.   Eyes: Negative for blurred vision and double vision.  Respiratory: Negative for cough, hemoptysis, shortness of breath and wheezing.   Cardiovascular: Negative for chest pain, palpitations and leg swelling.  Gastrointestinal: Negative for abdominal pain, blood in stool, constipation, diarrhea, melena, nausea and vomiting.  Genitourinary: Negative for dysuria and urgency.    Musculoskeletal: Negative for back pain, falls, joint pain and myalgias.  Skin: Negative for itching and rash.       Per hpi  Neurological: Negative for dizziness, tingling, sensory change, loss of consciousness, weakness and headaches.  Endo/Heme/Allergies: Negative for environmental allergies. Does not bruise/bleed easily.  Psychiatric/Behavioral: Negative for depression. The patient is not nervous/anxious and does not have insomnia.       Allergies  Allergen Reactions  . Hydrochlorothiazide Other (See Comments)    Hypokalemia   . Lisinopril Other (See Comments) and Cough    Hands numb   . Metformin And Related     Fatigue, "heart was beating slower"    Past Medical History:  Diagnosis Date  . Allergy   . Anxiety and depression   . Anxiety and depression   . Arthritis   . Breast cancer ([place]) 1990   LT MASTECTOMY  . Cancer ([place]) 1990   LT BREAST MASTECTOMY  . CKD (chronic kidney disease), stage III ([place])   . Diabetes mellitus without complication ([place])   . Family history of breast cancer   . Family history of pancreatic cancer   . GERD (gastroesophageal reflux disease)   . Hypertension   . Hypertensive CKD (chronic kidney disease)   . Personal history of chemotherapy   . Status post chemotherapy    breast cancer  . Temporal arteritis ([place]) 10/28/2016   Overview:  Right temporal arteritis  Biopsy proven 11/08/2016    Past Surgical History:  Procedure Laterality Date  . ABDOMINAL HYSTERECTOMY    . BREAST SURGERY    . COLONOSCOPY WITH PROPOFOL N/A 06/04/2015   Procedure: COLONOSCOPY WITH PROPOFOL;  Surgeon: Manya Silvas, MD;  Location: [facility];  Service: Endoscopy;  Laterality: N/A;  . IR IMAGING GUIDED PORT INSERTION  05/09/2020  . MASTECTOMY Left 1990   BREAST CA  . TEMPORAL ARTERY BIOPSY / LIGATION  11/2016    Social History   Socioeconomic History  . Marital status: Widowed    Spouse name: Not on file  . Number of children: 1  . Years of  education: 48  . Highest education level: Not on file  Occupational History  . Occupation: retired  Tobacco Use  . Smoking status: Never Smoker  . Smokeless tobacco: Never Used  Vaping Use  . Vaping Use: Never used  Substance and Sexual Activity  . Alcohol use: No    Alcohol/week: 0.0 standard drinks  . Drug use: No  . Sexual activity: Not Currently  Other Topics Concern  . Not on file  Social History Narrative   Pt lives with her daughter   Social Determinants of Health   Financial Resource Strain: Low Risk   . Difficulty of Paying Living Expenses: Not hard at all  Food Insecurity: No Food Insecurity  . Worried About Charity fundraiser in the Last Year: Never true  . Ran Out of Food in the Last Year: Never true  Transportation Needs: No Transportation Needs  . Lack of Transportation (Medical): No  . Lack of Transportation (Non-Medical): No  Physical Activity: Inactive  . Days of Exercise per Week: 0 days  . Minutes of Exercise per Session: 0 min  Stress: No Stress Concern Present  . Feeling of Stress : Not at all  Social Connections: Moderately Integrated  . Frequency of Communication with Friends and Family: More than three times a week  . Frequency of Social Gatherings with Friends and Family: Twice a week  . Attends Religious Services: More than 4 times per year  . Active Member of Clubs or Organizations: Yes  . Attends Archivist Meetings: More than 4 times per year  . Marital Status: Divorced  Human resources officer Violence: Not At Risk  . Fear of Current or Ex-Partner: No  . Emotionally Abused: No  . Physically Abused: No  . Sexually Abused: No    Family History  Problem Relation Age of Onset  . Breast cancer Mother 22  . Lung cancer Father        lung  . Cancer Maternal Aunt        unk types  . Cancer Maternal Uncle        unk types  . Pancreatic cancer Cousin      Current Outpatient Medications:  .  acetaminophen (TYLENOL) 325 MG tablet,  Take 650 mg by mouth every 6 (six) hours as needed for mild pain., Disp: , Rfl:  .  ascorbic acid (VITAMIN C) 1000 MG tablet, Take by mouth., Disp: , Rfl:  .  aspirin 81 MG tablet, Take 81 mg by mouth daily., Disp: , Rfl:  .  fluticasone (FLONASE) 50 MCG/ACT nasal spray, Place 2 sprays into both nostrils daily., Disp: 48 g, Rfl: 3 .  lidocaine-prilocaine (EMLA) cream, Apply to affected area once, Disp: 30 g, Rfl: 3 .  Multiple Vitamin (MULTIVITAMIN) tablet, Take 1 tablet by mouth daily., Disp: , Rfl:  .  OS-CAL CALCIUM + D3 500-200 MG-UNIT TABS, Take 1 tablet by mouth 2 (two) times daily with a meal., Disp: , Rfl: 11 .  prochlorperazine (COMPAZINE) 10 MG tablet, Take 1 tablet (10 mg total) by mouth every 6 (six) hours as needed (Nausea or vomiting)., Disp:  60 tablet, Rfl: 3 .  spironolactone (ALDACTONE) 50 MG tablet, TAKE 1 TABLET BY MOUTH EVERY DAY, Disp: 90 tablet, Rfl: 3  Physical exam:  Vitals:   05/15/20 1401  BP: (!) 142/57  Pulse: 64  Resp: 20  Temp: 98 F (36.7 C)  SpO2: 99%  Weight: 166 lb 3.2 oz (75.4 kg)   Physical Exam Constitutional:      General: She is not in acute distress.    Comments: Accompanied by daughter  Skin:    General: Skin is warm and dry.     Comments: Slight redness overlying port right chest. No drainage, wounds, skin openings. Not warm or swollen appearing. Nontender.   Neurological:     Mental Status: She is alert and oriented to person, place, and time.  Psychiatric:        Mood and Affect: Mood normal.        Behavior: Behavior normal.      CMP Latest Ref Rng & Units 05/12/2020  Glucose 70 - 99 mg/dL 174(H)  BUN 8 - 23 mg/dL 18  Creatinine 0.44 - 1.00 mg/dL 1.52(H)  Sodium 135 - 145 mmol/L 136  Potassium 3.5 - 5.1 mmol/L 3.9  Chloride 98 - 111 mmol/L 104  CO2 22 - 32 mmol/L 24  Calcium 8.9 - 10.3 mg/dL 9.7  Total Protein 6.5 - 8.1 g/dL 7.0  Total Bilirubin 0.3 - 1.2 mg/dL 0.7  Alkaline Phos 38 - 126 U/L 61  AST 15 - 41 U/L 22  ALT 0 -  44 U/L 10   CBC Latest Ref Rng & Units 05/12/2020  WBC 4.0 - 10.5 K/uL 6.8  Hemoglobin 12.0 - 15.0 g/dL 11.7(L)  Hematocrit 36 - 46 % 36.3  Platelets 150 - 400 K/uL 169    No images are attached to the encounter.  NM PET Image Initial (PI) Skull Base To Thigh  Result Date: 04/17/2020 CLINICAL DATA:  Initial treatment strategy for pancreatic mass. EXAM: NUCLEAR MEDICINE PET SKULL BASE TO THIGH TECHNIQUE: 9.6 mCi F-18 FDG was injected intravenously. Full-ring PET imaging was performed from the skull base to thigh after the radiotracer. CT data was obtained and used for attenuation correction and anatomic localization. Fasting blood glucose: 89 mg/dl COMPARISON:  Multiple exams, including CT abdomen from 04/09/2020 FINDINGS: Mediastinal blood pool activity: SUV max 3.5 Liver activity: SUV max NA NECK: No significant abnormal hypermetabolic activity in this region. Incidental CT findings: Mild left common carotid atherosclerotic calcification. CHEST: No significant abnormal hypermetabolic activity in this region. Incidental CT findings: Coronary, aortic arch, and branch vessel atherosclerotic vascular disease. Mild cardiomegaly. Left mastectomy. ABDOMEN/PELVIS: Infiltrative mass of the pancreatic tail and body shown on recent CT has a maximum SUV of 7.7, high suspicion for pancreatic adenocarcinoma. Dilated dorsal pancreatic duct distal to the mass. Nondistention of the stomach probably accounts for the wall thickening in the stomach wall proximally given the lack of associated significant hypermetabolic activity. Hypermetabolic omental caking of tumor. A representative region of involvement in the left abdomen has a maximum SUV of 8.8. Left para-aortic tumor just anterior to the left ureter is likely causing some extrinsic compression of the ureter. This measures 2.6 by 1.3 cm and has a maximum SUV of about 6.6. Incidental CT findings: Mild left hydronephrosis and proximal hydroureter. Aortoiliac  atherosclerotic vascular disease. Small amount of pelvic ascites, possibly malignant. SKELETON: No significant abnormal hypermetabolic activity in this region. A 0.8 cm lucent lesion in the T12 vertebral body does not have appreciable associated  hypermetabolic activity, and was present on 07/12/2011, hence is likely a small hemangioma or similar benign lesion. Incidental CT findings: Grade 2 anterolisthesis at L4-5 due to prominent degenerative facet arthropathy. Given the degree of facet arthropathy, I suspect that the low-grade activity in the right facet joint is due to arthropathy rather than malignancy. Thoracic spondylosis. IMPRESSION: 1. Hypermetabolic pancreatic mass favoring pancreatic adenocarcinoma, with hypermetabolic omental caking of tumor and a small amount of pelvic ascites which may be malignant. 2. No definite hypermetabolic activity in the stomach wall, wall thickening is probably from nondistention. 3. Hypermetabolic focus of tumor to the left of the aorta adjacent to the left ureter is causing left hydronephrosis and hydroureter, potentially from extrinsic compression or ureteral invasion. 4. Other imaging findings of potential clinical significance: Aortic Atherosclerosis (ICD10-I70.0). Prominent facet arthropathy at L4-5 with grade 2 anterolisthesis. Coronary atherosclerosis. Mild cardiomegaly. Electronically Signed   By: Van Clines M.D.   On: 04/17/2020 16:53   CT Biopsy  Result Date: 04/24/2020 INDICATION: 86 year old female with recently diagnosed pancreatic mass and masslike omental thickening. EXAM: CT BIOPSY COMPARISON:  PET-CT from 04/17/2020 MEDICATIONS: None. ANESTHESIA/SEDATION: Fentanyl 50 mcg IV; Versed 1 mg IV Sedation time: 10 minutes; The patient was continuously monitored during the procedure by the interventional radiology nurse under my direct supervision. CONTRAST:  None. COMPLICATIONS: None immediate. PROCEDURE: Informed consent was obtained from the patient  following an explanation of the procedure, risks, benefits and alternatives. A time out was performed prior to the initiation of the procedure. The patient was positioned supine on the CT table and a limited CT was performed for procedural planning demonstrating similar appearing omental thickening in the left anterior abdomen. The procedure was planned. The operative site was prepped and draped in the usual sterile fashion. Appropriate trajectory was confirmed with a 22 gauge spinal needle after the adjacent tissues were anesthetized with 1% Lidocaine with epinephrine. Under intermittent CT guidance, a 17 gauge coaxial needle was advanced into the peripheral aspect of the mass. Appropriate positioning was confirmed and a total of 4 samples were obtained with an 18 gauge core needle biopsy device. The co-axial needle was removed and hemostasis was achieved with manual compression. A limited postprocedural CT was negative for hemorrhage or additional complication. A dressing was placed. The patient tolerated the procedure well without immediate postprocedural complication. IMPRESSION: Technically successful CT guided core needle biopsy of left anterior omental mass. Ruthann Cancer, MD [facility] Electronically Signed   By: Ruthann Cancer MD   On: 04/24/2020 13:43   IR IMAGING GUIDED PORT INSERTION  Result Date: 05/09/2020 CLINICAL DATA:  Stage IV pancreas cancer, access for chemotherapy EXAM: RIGHT INTERNAL JUGULAR SINGLE LUMEN POWER PORT CATHETER INSERTION Date:  05/09/2020 05/09/2020 11:01 am Radiologist:  M. Daryll Brod, MD Guidance:  Ultrasound and fluoroscopic MEDICATIONS: Ancef 2 g; The antibiotic was administered within an appropriate time interval prior to skin puncture. ANESTHESIA/SEDATION: Versed 1 mg IV; Fentanyl 50 mcg IV; Moderate Sedation Time:  30 minutes The patient was continuously monitored during the procedure by the interventional  radiology nurse under my direct supervision. FLUOROSCOPY TIME:  0 minutes, 36 seconds (5 mGy) COMPLICATIONS: None immediate. CONTRAST:  None. PROCEDURE: Informed consent was obtained from the patient following explanation of the procedure, risks, benefits and alternatives. The patient understands, agrees and consents for the procedure. All questions were addressed. A time out was performed. Maximal barrier sterile technique utilized including caps, mask, sterile gowns, sterile gloves, large sterile  drape, hand hygiene, and 2% chlorhexidine scrub. Under sterile conditions and local anesthesia, right internal jugular micropuncture venous access was performed. Access was performed with ultrasound. Images were obtained for documentation of the patent right internal jugular vein. A guide wire was inserted followed by a transitional dilator. This allowed insertion of a guide wire and catheter into the IVC. Measurements were obtained from the SVC / RA junction back to the right IJ venotomy site. In the right infraclavicular chest, a subcutaneous pocket was created over the second anterior rib. This was done under sterile conditions and local anesthesia. 1% lidocaine with epinephrine was utilized for this. A 2.5 cm incision was made in the skin. Blunt dissection was performed to create a subcutaneous pocket over the right pectoralis major muscle. The pocket was flushed with saline vigorously. There was adequate hemostasis. The port catheter was assembled and checked for leakage. The port catheter was secured in the pocket with two retention sutures. The tubing was tunneled subcutaneously to the right venotomy site and inserted into the SVC/RA junction through a valved peel-away sheath. Position was confirmed with fluoroscopy. Images were obtained for documentation. The patient tolerated the procedure well. No immediate complications. Incisions were closed in a two layer fashion with 4 - 0 Vicryl suture. Dermabond was  applied to the skin. The port catheter was accessed, blood was aspirated followed by saline and heparin flushes. Needle was removed. A dry sterile dressing was applied. IMPRESSION: Ultrasound and fluoroscopically guided right internal jugular single lumen power port catheter insertion. Tip in the SVC/RA junction. Catheter ready for use. Electronically Signed   By: Jerilynn Mages.  Shick M.D.   On: 05/09/2020 11:10    Assessment and plan- Patient is a 86 y.o. female diagnosed with stage IV pancreatic cancer who presents to symptom management clinic for possible Port-A-Cath infection.  Exam today not consistent with infection.  Suspect skin irritation overlying port secondary to adhesives from recent dressing.  Advised to keep clean and dry. Can try topical hydrocortisone OTC.  Otherwise continue to monitor notify clinic if symptoms of infection appear or if current symptoms worsen.  Follow-up with Dr. Grayland Ormond as scheduled   Visit Diagnosis 1. Skin irritation     Patient expressed understanding and was in agreement with this plan. She also understands that She can call clinic at any time with any questions, concerns, or complaints.   Thank you for allowing me to participate in the care of this very pleasant patient.   Beckey Rutter, DNP, AGNP-C [place] [facility]

## 2020-05-18 NOTE — Progress Notes (Signed)
[facility]  Telephone:(336) [phone] Fax:(336) [phone]  ID: Veronica Colon OB: 12/17/1931  MR#: [id number]  HER#:[id number]  Patient Care Team: Delsa Grana, PA-C as PCP - General (Family Medicine) Marlowe Sax, MD as Referring Physician (Internal Medicine) Birder Robson, MD as Referring Physician (Ophthalmology)  CHIEF COMPLAINT: Stage IV pancreatic cancer.  INTERVAL HISTORY: Patient returns to clinic today for further evaluation and consideration of cycle 1, day 8 of single agent gemcitabine.  She tolerated her first treatment well without significant side effects.  She has mild abdominal bloating and discomfort, but otherwise feels well. He has no neurologic complaints.  She denies any recent fevers or illnesses.  She has a good appetite and denies weight loss.  She denies any pain.  She has no chest pain, shortness of breath, cough, or hemoptysis.  She denies any nausea, vomiting, or diarrhea.  She has noted no changes in her bowel movements and denies any melena or hematochezia.  She has no urinary complaints.  Patient offers no further specific complaints today.  REVIEW OF SYSTEMS:   Review of Systems  Constitutional: Negative.  Negative for fever, malaise/fatigue and weight loss.  Respiratory: Negative.  Negative for cough and hemoptysis.   Cardiovascular: Negative.  Negative for chest pain and leg swelling.  Gastrointestinal: Positive for constipation. Negative for abdominal pain, blood in stool and melena.  Genitourinary: Negative.  Negative for dysuria.  Musculoskeletal: Negative.  Negative for back pain.  Skin: Negative.  Negative for rash.  Neurological: Negative.  Negative for dizziness, focal weakness, weakness and headaches.  Psychiatric/Behavioral: Negative.  The patient is not nervous/anxious.     As per HPI. Otherwise, a complete review of systems is negative.  PAST MEDICAL HISTORY: Past Medical History:  Diagnosis Date  . Allergy     . Anxiety and depression   . Anxiety and depression   . Arthritis   . Breast cancer ([place]) 1990   LT MASTECTOMY  . Cancer ([place]) 1990   LT BREAST MASTECTOMY  . CKD (chronic kidney disease), stage III ([place])   . Diabetes mellitus without complication ([place])   . Family history of breast cancer   . Family history of pancreatic cancer   . GERD (gastroesophageal reflux disease)   . Hypertension   . Hypertensive CKD (chronic kidney disease)   . Personal history of chemotherapy   . Status post chemotherapy    breast cancer  . Temporal arteritis ([place]) 10/28/2016   Overview:  Right temporal arteritis  Biopsy proven 11/08/2016    PAST SURGICAL HISTORY: Past Surgical History:  Procedure Laterality Date  . ABDOMINAL HYSTERECTOMY    . BREAST SURGERY    . COLONOSCOPY WITH PROPOFOL N/A 06/04/2015   Procedure: COLONOSCOPY WITH PROPOFOL;  Surgeon: Manya Silvas, MD;  Location: [facility] ENDOSCOPY;  Service: Endoscopy;  Laterality: N/A;  . IR IMAGING GUIDED PORT INSERTION  05/09/2020  . MASTECTOMY Left 1990   BREAST CA  . TEMPORAL ARTERY BIOPSY / LIGATION  11/2016    FAMILY HISTORY: Family History  Problem Relation Age of Onset  . Breast cancer Mother 6  . Lung cancer Father        lung  . Cancer Maternal Aunt        unk types  . Cancer Maternal Uncle        unk types  . Pancreatic cancer Cousin     ADVANCED DIRECTIVES (Y/N):  N  HEALTH MAINTENANCE: Social History   Tobacco Use  . Smoking status: Never  Smoker  . Smokeless tobacco: Never Used  Vaping Use  . Vaping Use: Never used  Substance Use Topics  . Alcohol use: No    Alcohol/week: 0.0 standard drinks  . Drug use: No     Colonoscopy:  PAP:  Bone density:  Lipid panel:  Allergies  Allergen Reactions  . Hydrochlorothiazide Other (See Comments)    Hypokalemia   . Lisinopril Other (See Comments) and Cough    Hands numb   . Metformin And Related     Fatigue, "heart was beating slower"    Current Outpatient  Medications  Medication Sig Dispense Refill  . acetaminophen (TYLENOL) 325 MG tablet Take 650 mg by mouth every 6 (six) hours as needed for mild pain.    Marland Kitchen ascorbic acid (VITAMIN C) 1000 MG tablet Take by mouth.    Marland Kitchen aspirin 81 MG tablet Take 81 mg by mouth daily.    . fluticasone (FLONASE) 50 MCG/ACT nasal spray Place 2 sprays into both nostrils daily. 48 g 3  . lidocaine-prilocaine (EMLA) cream Apply to affected area once 30 g 3  . Multiple Vitamin (MULTIVITAMIN) tablet Take 1 tablet by mouth daily.    Dellia Nims CALCIUM + D3 500-200 MG-UNIT TABS Take 1 tablet by mouth 2 (two) times daily with a meal.  11  . prochlorperazine (COMPAZINE) 10 MG tablet Take 1 tablet (10 mg total) by mouth every 6 (six) hours as needed (Nausea or vomiting). 60 tablet 3  . spironolactone (ALDACTONE) 50 MG tablet TAKE 1 TABLET BY MOUTH EVERY DAY 90 tablet 3   No current facility-administered medications for this visit.   Facility-Administered Medications Ordered in Other Visits  Medication Dose Route Frequency Provider Last Rate Last Admin  . heparin lock flush 100 unit/mL  500 Units Intracatheter Once PRN Lloyd Huger, MD        OBJECTIVE: Vitals:   05/19/20 0937  BP: 125/65  Pulse: 65  Resp: 20  Temp: 97.6 F (36.4 C)  SpO2: 100%     Body mass index is 27.91 kg/m.    ECOG FS:0 - Asymptomatic  General: Well-developed, well-nourished, no acute distress. Eyes: Pink conjunctiva, anicteric sclera. HEENT: Normocephalic, moist mucous membranes. Lungs: No audible wheezing or coughing. Heart: Regular rate and rhythm. Abdomen: Soft, nontender, no obvious distention. Musculoskeletal: No edema, cyanosis, or clubbing. Neuro: Alert, answering all questions appropriately. Cranial nerves grossly intact. Skin: No rashes or petechiae noted. Psych: Normal affect.   LAB RESULTS:  Lab Results  Component Value Date   NA 134 (L) 05/19/2020   K 3.8 05/19/2020   CL 102 05/19/2020   CO2 23 05/19/2020    GLUCOSE 191 (H) 05/19/2020   BUN 22 05/19/2020   CREATININE 1.40 (H) 05/19/2020   CALCIUM 9.6 05/19/2020   PROT 7.1 05/19/2020   ALBUMIN 3.8 05/19/2020   AST 31 05/19/2020   ALT 32 05/19/2020   ALKPHOS 68 05/19/2020   BILITOT 0.7 05/19/2020   GFRNONAA 37 (L) 05/19/2020   GFRAA 36 (L) 04/11/2020    Lab Results  Component Value Date   WBC 3.6 (L) 05/19/2020   NEUTROABS 2.1 05/19/2020   HGB 10.9 (L) 05/19/2020   HCT 33.9 (L) 05/19/2020   MCV 85.8 05/19/2020   PLT 127 (L) 05/19/2020     STUDIES: CT Biopsy  Result Date: 04-May-2020 INDICATION: 86 year old female with recently diagnosed pancreatic mass and masslike omental thickening. EXAM: CT BIOPSY COMPARISON:  PET-CT from 04/17/2020 MEDICATIONS: None. ANESTHESIA/SEDATION: Fentanyl 50 mcg IV; Versed 1  mg IV Sedation time: 10 minutes; The patient was continuously monitored during the procedure by the interventional radiology nurse under my direct supervision. CONTRAST:  None. COMPLICATIONS: None immediate. PROCEDURE: Informed consent was obtained from the patient following an explanation of the procedure, risks, benefits and alternatives. A time out was performed prior to the initiation of the procedure. The patient was positioned supine on the CT table and a limited CT was performed for procedural planning demonstrating similar appearing omental thickening in the left anterior abdomen. The procedure was planned. The operative site was prepped and draped in the usual sterile fashion. Appropriate trajectory was confirmed with a 22 gauge spinal needle after the adjacent tissues were anesthetized with 1% Lidocaine with epinephrine. Under intermittent CT guidance, a 17 gauge coaxial needle was advanced into the peripheral aspect of the mass. Appropriate positioning was confirmed and a total of 4 samples were obtained with an 18 gauge core needle biopsy device. The co-axial needle was removed and hemostasis was achieved with manual compression. A  limited postprocedural CT was negative for hemorrhage or additional complication. A dressing was placed. The patient tolerated the procedure well without immediate postprocedural complication. IMPRESSION: Technically successful CT guided core needle biopsy of left anterior omental mass. Ruthann Cancer, MD [facility] Radiology Electronically Signed   By: Ruthann Cancer MD   On: 04/24/2020 13:43   IR IMAGING GUIDED PORT INSERTION  Result Date: 05/09/2020 CLINICAL DATA:  Stage IV pancreas cancer, access for chemotherapy EXAM: RIGHT INTERNAL JUGULAR SINGLE LUMEN POWER PORT CATHETER INSERTION Date:  05/09/2020 05/09/2020 11:01 am Radiologist:  M. Daryll Brod, MD Guidance:  Ultrasound and fluoroscopic MEDICATIONS: Ancef 2 g; The antibiotic was administered within an appropriate time interval prior to skin puncture. ANESTHESIA/SEDATION: Versed 1 mg IV; Fentanyl 50 mcg IV; Moderate Sedation Time:  30 minutes The patient was continuously monitored during the procedure by the interventional radiology nurse under my direct supervision. FLUOROSCOPY TIME:  0 minutes, 36 seconds (5 mGy) COMPLICATIONS: None immediate. CONTRAST:  None. PROCEDURE: Informed consent was obtained from the patient following explanation of the procedure, risks, benefits and alternatives. The patient understands, agrees and consents for the procedure. All questions were addressed. A time out was performed. Maximal barrier sterile technique utilized including caps, mask, sterile gowns, sterile gloves, large sterile drape, hand hygiene, and 2% chlorhexidine scrub. Under sterile conditions and local anesthesia, right internal jugular micropuncture venous access was performed. Access was performed with ultrasound. Images were obtained for documentation of the patent right internal jugular vein. A guide wire was inserted followed by a transitional dilator. This allowed insertion of a guide wire and catheter  into the IVC. Measurements were obtained from the SVC / RA junction back to the right IJ venotomy site. In the right infraclavicular chest, a subcutaneous pocket was created over the second anterior rib. This was done under sterile conditions and local anesthesia. 1% lidocaine with epinephrine was utilized for this. A 2.5 cm incision was made in the skin. Blunt dissection was performed to create a subcutaneous pocket over the right pectoralis major muscle. The pocket was flushed with saline vigorously. There was adequate hemostasis. The port catheter was assembled and checked for leakage. The port catheter was secured in the pocket with two retention sutures. The tubing was tunneled subcutaneously to the right venotomy site and inserted into the SVC/RA junction through a valved peel-away sheath. Position was confirmed with fluoroscopy. Images were obtained for documentation. The patient tolerated the procedure well.  No immediate complications. Incisions were closed in a two layer fashion with 4 - 0 Vicryl suture. Dermabond was applied to the skin. The port catheter was accessed, blood was aspirated followed by saline and heparin flushes. Needle was removed. A dry sterile dressing was applied. IMPRESSION: Ultrasound and fluoroscopically guided right internal jugular single lumen power port catheter insertion. Tip in the SVC/RA junction. Catheter ready for use. Electronically Signed   By: Jerilynn Mages.  Shick M.D.   On: 05/09/2020 11:10    ASSESSMENT: Stage IV pancreatic cancer.  PLAN:   1.  Stage IV pancreatic cancer: Omental biopsy confirmed results.  PET scans results from April 09, 2020 reviewed independently and reported as above confirming stage of disease.  Patient CA 19-9 is also significantly elevated at 19,661.  After lengthy discussion with the patient, she wishes to pursue palliative chemotherapy using single agent gemcitabine on days 1, 8, 15 of a 28-day cycle.  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  We once again discussed the possibility of adding in capecitabine, but will reconsider in the future if patient wishes to pursue a more aggressive stance.  Patient has had port placement.  Proceed with cycle 1, day 8 of treatment today.  Return to clinic in 1 week for further evaluation and consideration of cycle 1, day 15.  2.  MGUS: Patient's most recent M spike is only 0.2.  This is likely clinically insignificant.  No further work-up is necessary. 3.  History of colon cancer: Patient had stage I colon cancer 8+ years ago 4.  History of breast cancer: 31 years ago.  Unclear stage or type of malignancy. 5.  Renal insufficiency: Possibly secondary to obstruction from omental caking.  Creatinine improved to 1.4. 6.  Leukopenia: Mild, monitor. 7.  Anemia: Patient's hemoglobin is trended down to 10.9, monitor. 8.  Thrombocytopenia: Mild, proceed with treatment as above.  Patient expressed understanding and was in agreement with this plan. She also understands that She can call clinic at any time with any questions, concerns, or complaints.    Lloyd Huger, MD   05/19/2020 12:46 PM

## 2020-05-19 ENCOUNTER — Other Ambulatory Visit: Payer: Self-pay

## 2020-05-19 ENCOUNTER — Encounter: Payer: Self-pay | Admitting: Oncology

## 2020-05-19 ENCOUNTER — Inpatient Hospital Stay: Payer: Medicare Other | Admitting: Oncology

## 2020-05-19 ENCOUNTER — Inpatient Hospital Stay: Payer: Medicare Other

## 2020-05-19 VITALS — BP 125/65 | HR 65 | Temp 97.6°F | Resp 20 | Wt 162.6 lb

## 2020-05-19 DIAGNOSIS — Z95828 Presence of other vascular implants and grafts: Secondary | ICD-10-CM

## 2020-05-19 DIAGNOSIS — C259 Malignant neoplasm of pancreas, unspecified: Secondary | ICD-10-CM

## 2020-05-19 LAB — CBC WITH DIFFERENTIAL/PLATELET
Abs Immature Granulocytes: 0.02 10*3/uL (ref 0.00–0.07)
Basophils Absolute: 0 10*3/uL (ref 0.0–0.1)
Basophils Relative: 1 %
Eosinophils Absolute: 0 10*3/uL (ref 0.0–0.5)
Eosinophils Relative: 1 %
HCT: 33.9 % — ABNORMAL LOW (ref 36.0–46.0)
Hemoglobin: 10.9 g/dL — ABNORMAL LOW (ref 12.0–15.0)
Immature Granulocytes: 1 %
Lymphocytes Relative: 34 %
Lymphs Abs: 1.2 10*3/uL (ref 0.7–4.0)
MCH: 27.6 pg (ref 26.0–34.0)
MCHC: 32.2 g/dL (ref 30.0–36.0)
MCV: 85.8 fL (ref 80.0–100.0)
Monocytes Absolute: 0.3 10*3/uL (ref 0.1–1.0)
Monocytes Relative: 7 %
Neutro Abs: 2.1 10*3/uL (ref 1.7–7.7)
Neutrophils Relative %: 56 %
Platelets: 127 10*3/uL — ABNORMAL LOW (ref 150–400)
RBC: 3.95 MIL/uL (ref 3.87–5.11)
RDW: 13.5 % (ref 11.5–15.5)
WBC: 3.6 10*3/uL — ABNORMAL LOW (ref 4.0–10.5)
nRBC: 0.6 % — ABNORMAL HIGH (ref 0.0–0.2)

## 2020-05-19 LAB — COMPREHENSIVE METABOLIC PANEL
ALT: 32 U/L (ref 0–44)
AST: 31 U/L (ref 15–41)
Albumin: 3.8 g/dL (ref 3.5–5.0)
Alkaline Phosphatase: 68 U/L (ref 38–126)
Anion gap: 9 (ref 5–15)
BUN: 22 mg/dL (ref 8–23)
CO2: 23 mmol/L (ref 22–32)
Calcium: 9.6 mg/dL (ref 8.9–10.3)
Chloride: 102 mmol/L (ref 98–111)
Creatinine, Ser: 1.4 mg/dL — ABNORMAL HIGH (ref 0.44–1.00)
GFR, Estimated: 37 mL/min — ABNORMAL LOW (ref >60)
Glucose, Bld: 191 mg/dL — ABNORMAL HIGH (ref 70–99)
Potassium: 3.8 mmol/L (ref 3.5–5.1)
Sodium: 134 mmol/L — ABNORMAL LOW (ref 135–145)
Total Bilirubin: 0.7 mg/dL (ref 0.3–1.2)
Total Protein: 7.1 g/dL (ref 6.5–8.1)

## 2020-05-19 MED ORDER — HEPARIN SOD (PORK) LOCK FLUSH 100 UNIT/ML IV SOLN
INTRAVENOUS | Status: AC
  Filled 2020-05-19: qty 5

## 2020-05-19 MED ORDER — HEPARIN SOD (PORK) LOCK FLUSH 100 UNIT/ML IV SOLN
500.0000 [IU] | Freq: Once | INTRAVENOUS | Status: AC
  Administered 2020-05-19: 500 [IU] via INTRAVENOUS
  Filled 2020-05-19: qty 5

## 2020-05-19 MED ORDER — SODIUM CHLORIDE 0.9 % IV SOLN
1800.0000 mg | Freq: Once | INTRAVENOUS | Status: AC
  Administered 2020-05-19: 1800 mg via INTRAVENOUS
  Filled 2020-05-19: qty 26.3

## 2020-05-19 MED ORDER — SODIUM CHLORIDE 0.9% FLUSH
10.0000 mL | Freq: Once | INTRAVENOUS | Status: AC
  Administered 2020-05-19: 10 mL via INTRAVENOUS
  Filled 2020-05-19: qty 10

## 2020-05-19 MED ORDER — SODIUM CHLORIDE 0.9 % IV SOLN
Freq: Once | INTRAVENOUS | Status: AC
  Filled 2020-05-19: qty 250

## 2020-05-19 MED ORDER — PROCHLORPERAZINE MALEATE 10 MG PO TABS
10.0000 mg | ORAL_TABLET | Freq: Once | ORAL | Status: AC
  Administered 2020-05-19: 10 mg via ORAL
  Filled 2020-05-19: qty 1

## 2020-05-19 MED ORDER — HEPARIN SOD (PORK) LOCK FLUSH 100 UNIT/ML IV SOLN
500.0000 [IU] | Freq: Once | INTRAVENOUS | Status: DC | PRN
  Filled 2020-05-19: qty 5

## 2020-05-23 ENCOUNTER — Encounter: Payer: Self-pay | Admitting: Oncology

## 2020-05-23 NOTE — Progress Notes (Signed)
Patient daughter stated she said she is having pain on side head and waned to know if it was cause of the chemo.

## 2020-05-24 NOTE — Progress Notes (Signed)
Veronica Colon  Telephone:(336) [phone] Fax:(336) [phone]  ID: Etosha Wetherell Maturin OB: 1931/09/29  MR#: [id number]  FWY#:[id number]  Patient Care Team: Delsa Grana, PA-C as PCP - General (Family Medicine) Marlowe Sax, MD as Referring Physician (Internal Medicine) Birder Robson, MD as Referring Physician (Ophthalmology) Lloyd Huger, MD as Consulting Physician (Hematology and Oncology)  CHIEF COMPLAINT: Stage IV pancreatic cancer.  INTERVAL HISTORY: Patient returns to clinic today for further evaluation and consideration of cycle 1, day 15 of single agent gemcitabine.  She is tolerating her treatments well without significant side effects.  She currently feels well and is asymptomatic.  She has no neurologic complaints.  She denies any recent fevers or illnesses.  She has a good appetite and denies weight loss.  She denies any pain.  She has no chest pain, shortness of breath, cough, or hemoptysis.  She denies any nausea, vomiting, constipation, or diarrhea.  She has noted no changes in her bowel movements and denies any melena or hematochezia.  She has no urinary complaints.  Patient offers no specific complaints today.  REVIEW OF SYSTEMS:   Review of Systems  Constitutional: Negative.  Negative for fever, malaise/fatigue and weight loss.  Respiratory: Negative.  Negative for cough and hemoptysis.   Cardiovascular: Negative.  Negative for chest pain and leg swelling.  Gastrointestinal: Negative.  Negative for abdominal pain, blood in stool, constipation and melena.  Genitourinary: Negative.  Negative for dysuria.  Musculoskeletal: Negative.  Negative for back pain.  Skin: Negative.  Negative for rash.  Neurological: Negative.  Negative for dizziness, focal weakness, weakness and headaches.  Psychiatric/Behavioral: Negative.  The patient is not nervous/anxious.     As per HPI. Otherwise, a complete review of systems is negative.  PAST MEDICAL  HISTORY: Past Medical History:  Diagnosis Date  . Allergy   . Anxiety and depression   . Anxiety and depression   . Arthritis   . Breast cancer ([place]) 1990   LT MASTECTOMY  . Cancer ([place]) 1990   LT BREAST MASTECTOMY  . CKD (chronic kidney disease), stage III ([place])   . Diabetes mellitus without complication ([place])   . Family history of breast cancer   . Family history of pancreatic cancer   . GERD (gastroesophageal reflux disease)   . Hypertension   . Hypertensive CKD (chronic kidney disease)   . Personal history of chemotherapy   . Status post chemotherapy    breast cancer  . Temporal arteritis ([place]) 10/28/2016   Overview:  Right temporal arteritis  Biopsy proven 11/08/2016    PAST SURGICAL HISTORY: Past Surgical History:  Procedure Laterality Date  . ABDOMINAL HYSTERECTOMY    . BREAST SURGERY    . COLONOSCOPY WITH PROPOFOL N/A 06/04/2015   Procedure: COLONOSCOPY WITH PROPOFOL;  Surgeon: Manya Silvas, MD;  Location: [facility] ENDOSCOPY;  Service: Endoscopy;  Laterality: N/A;  . IR IMAGING GUIDED PORT INSERTION  05/09/2020  . MASTECTOMY Left 1990   BREAST CA  . TEMPORAL ARTERY BIOPSY / LIGATION  11/2016    FAMILY HISTORY: Family History  Problem Relation Age of Onset  . Breast cancer Mother 61  . Lung cancer Father        lung  . Cancer Maternal Aunt        unk types  . Cancer Maternal Uncle        unk types  . Pancreatic cancer Cousin     ADVANCED DIRECTIVES (Y/N):  N  HEALTH MAINTENANCE: Social History   Tobacco  Use  . Smoking status: Never Smoker  . Smokeless tobacco: Never Used  Vaping Use  . Vaping Use: Never used  Substance Use Topics  . Alcohol use: No    Alcohol/week: 0.0 standard drinks  . Drug use: No     Colonoscopy:  PAP:  Bone density:  Lipid panel:  Allergies  Allergen Reactions  . Hydrochlorothiazide Other (See Comments)    Hypokalemia   . Lisinopril Other (See Comments) and Cough    Hands numb   . Metformin And Related      Fatigue, "heart was beating slower"    Current Outpatient Medications  Medication Sig Dispense Refill  . acetaminophen (TYLENOL) 325 MG tablet Take 650 mg by mouth every 6 (six) hours as needed for mild pain.    Marland Kitchen ascorbic acid (VITAMIN C) 1000 MG tablet Take by mouth.    Marland Kitchen aspirin 81 MG tablet Take 81 mg by mouth daily.    . fluticasone (FLONASE) 50 MCG/ACT nasal spray Place 2 sprays into both nostrils daily. 48 g 3  . lidocaine-prilocaine (EMLA) cream Apply to affected area once 30 g 3  . Multiple Vitamin (MULTIVITAMIN) tablet Take 1 tablet by mouth daily.    Dellia Nims CALCIUM + D3 500-200 MG-UNIT TABS Take 1 tablet by mouth 2 (two) times daily with a meal.  11  . prochlorperazine (COMPAZINE) 10 MG tablet Take 1 tablet (10 mg total) by mouth every 6 (six) hours as needed (Nausea or vomiting). 60 tablet 3  . spironolactone (ALDACTONE) 50 MG tablet TAKE 1 TABLET BY MOUTH EVERY DAY 90 tablet 3   No current facility-administered medications for this visit.    OBJECTIVE: Vitals:   05/26/20 0959  BP: (!) 148/58  Pulse: 67  Resp: 20  Temp: 97.8 F (36.6 C)  SpO2: 100%     Body mass index is 26.18 kg/m.    ECOG FS:0 - Asymptomatic  General: Well-developed, well-nourished, no acute distress. Eyes: Pink conjunctiva, anicteric sclera. HEENT: Normocephalic, moist mucous membranes. Lungs: No audible wheezing or coughing. Heart: Regular rate and rhythm. Abdomen: Soft, nontender, no obvious distention. Musculoskeletal: No edema, cyanosis, or clubbing. Neuro: Alert, answering all questions appropriately. Cranial nerves grossly intact. Skin: No rashes or petechiae noted. Psych: Normal affect.  LAB RESULTS:  Lab Results  Component Value Date   NA 135 05/26/2020   K 3.9 05/26/2020   CL 102 05/26/2020   CO2 24 05/26/2020   GLUCOSE 147 (H) 05/26/2020   BUN 21 05/26/2020   CREATININE 1.42 (H) 05/26/2020   CALCIUM 9.7 05/26/2020   PROT 7.1 05/26/2020   ALBUMIN 3.8 05/26/2020   AST 41  05/26/2020   ALT 37 05/26/2020   ALKPHOS 61 05/26/2020   BILITOT 0.6 05/26/2020   GFRNONAA 36 (L) 05/26/2020   GFRAA 36 (L) 04/11/2020    Lab Results  Component Value Date   WBC 2.4 (L) 05/26/2020   NEUTROABS 1.3 (L) 05/26/2020   HGB 10.3 (L) 05/26/2020   HCT 31.5 (L) 05/26/2020   MCV 84.7 05/26/2020   PLT 71 (L) 05/26/2020     STUDIES: IR IMAGING GUIDED PORT INSERTION  Result Date: 05/09/2020 CLINICAL DATA:  Stage IV pancreas cancer, access for chemotherapy EXAM: RIGHT INTERNAL JUGULAR SINGLE LUMEN POWER PORT CATHETER INSERTION Date:  05/09/2020 05/09/2020 11:01 am Radiologist:  Jerilynn Mages. Daryll Brod, MD Guidance:  Ultrasound and fluoroscopic MEDICATIONS: Ancef 2 g; The antibiotic was administered within an appropriate time interval prior to skin puncture. ANESTHESIA/SEDATION: Versed 1 mg IV;  Fentanyl 50 mcg IV; Moderate Sedation Time:  30 minutes The patient was continuously monitored during the procedure by the interventional radiology nurse under my direct supervision. FLUOROSCOPY TIME:  0 minutes, 36 seconds (5 mGy) COMPLICATIONS: None immediate. CONTRAST:  None. PROCEDURE: Informed consent was obtained from the patient following explanation of the procedure, risks, benefits and alternatives. The patient understands, agrees and consents for the procedure. All questions were addressed. A time out was performed. Maximal barrier sterile technique utilized including caps, mask, sterile gowns, sterile gloves, large sterile drape, hand hygiene, and 2% chlorhexidine scrub. Under sterile conditions and local anesthesia, right internal jugular micropuncture venous access was performed. Access was performed with ultrasound. Images were obtained for documentation of the patent right internal jugular vein. A guide wire was inserted followed by a transitional dilator. This allowed insertion of a guide wire and catheter into the IVC. Measurements were obtained from the SVC / RA junction back to the right IJ  venotomy site. In the right infraclavicular chest, a subcutaneous pocket was created over the second anterior rib. This was done under sterile conditions and local anesthesia. 1% lidocaine with epinephrine was utilized for this. A 2.5 cm incision was made in the skin. Blunt dissection was performed to create a subcutaneous pocket over the right pectoralis major muscle. The pocket was flushed with saline vigorously. There was adequate hemostasis. The port catheter was assembled and checked for leakage. The port catheter was secured in the pocket with two retention sutures. The tubing was tunneled subcutaneously to the right venotomy site and inserted into the SVC/RA junction through a valved peel-away sheath. Position was confirmed with fluoroscopy. Images were obtained for documentation. The patient tolerated the procedure well. No immediate complications. Incisions were closed in a two layer fashion with 4 - 0 Vicryl suture. Dermabond was applied to the skin. The port catheter was accessed, blood was aspirated followed by saline and heparin flushes. Needle was removed. A dry sterile dressing was applied. IMPRESSION: Ultrasound and fluoroscopically guided right internal jugular single lumen power port catheter insertion. Tip in the SVC/RA junction. Catheter ready for use. Electronically Signed   By: Jerilynn Mages.  Shick M.D.   On: 05/09/2020 11:10    ASSESSMENT: Stage IV pancreatic cancer.  PLAN:   1.  Stage IV pancreatic cancer: Omental biopsy confirmed results.  PET scans results from April 09, 2020 reviewed independently and reported as above confirming stage of disease.  Patient CA 19-9 is also significantly elevated at 19,661.  After lengthy discussion with the patient, she wishes to pursue palliative chemotherapy using single agent gemcitabine on days 1, 8, 15 of a 28-day cycle.  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  We previously discussed the possibility of adding in capecitabine, but will  reconsider in the future if patient wishes to pursue a more aggressive stance.  Patient has had port placement.  Delay cycle 1, day 15 of treatment today secondary to thrombocytopenia.  Return to clinic in 2 weeks for further evaluation and consideration of cycle 2, day 1.  Will likely proceed with treatment on days 1 and 8 with day 15 off for the remainder of the cycles. 2.  MGUS: Patient's most recent M spike is only 0.2.  This is likely clinically insignificant.  No further work-up is necessary. 3.  History of colon cancer: Patient had stage I colon cancer 8+ years ago 4.  History of breast cancer: 31 years ago.  Unclear stage or type of malignancy. 5.  Renal insufficiency: Possibly secondary to obstruction from omental caking.  Chronic and unchanged. 6.  Leukopenia: White count has trended down, delay treatment as above. 7.  Anemia: Hemoglobin continues to trend down and is now 10.3.  Monitor. 8.  Thrombocytopenia: Platelet count is 71.  Delay treatment as above.  Patient expressed understanding and was in agreement with this plan. She also understands that She can call clinic at any time with any questions, concerns, or complaints.    Lloyd Huger, MD   05/26/2020 4:31 PM

## 2020-05-26 ENCOUNTER — Inpatient Hospital Stay: Payer: Medicare Other | Admitting: Oncology

## 2020-05-26 ENCOUNTER — Encounter: Payer: Self-pay | Admitting: Oncology

## 2020-05-26 ENCOUNTER — Other Ambulatory Visit: Payer: Self-pay

## 2020-05-26 ENCOUNTER — Inpatient Hospital Stay: Payer: Medicare Other

## 2020-05-26 VITALS — BP 148/58 | HR 67 | Temp 97.8°F | Resp 20 | Wt 152.5 lb

## 2020-05-26 DIAGNOSIS — C259 Malignant neoplasm of pancreas, unspecified: Secondary | ICD-10-CM

## 2020-05-26 LAB — CBC WITH DIFFERENTIAL/PLATELET
Abs Immature Granulocytes: 0.01 10*3/uL (ref 0.00–0.07)
Basophils Absolute: 0 10*3/uL (ref 0.0–0.1)
Basophils Relative: 0 %
Eosinophils Absolute: 0 10*3/uL (ref 0.0–0.5)
Eosinophils Relative: 0 %
HCT: 31.5 % — ABNORMAL LOW (ref 36.0–46.0)
Hemoglobin: 10.3 g/dL — ABNORMAL LOW (ref 12.0–15.0)
Immature Granulocytes: 0 %
Lymphocytes Relative: 38 %
Lymphs Abs: 0.9 10*3/uL (ref 0.7–4.0)
MCH: 27.7 pg (ref 26.0–34.0)
MCHC: 32.7 g/dL (ref 30.0–36.0)
MCV: 84.7 fL (ref 80.0–100.0)
Monocytes Absolute: 0.1 10*3/uL (ref 0.1–1.0)
Monocytes Relative: 5 %
Neutro Abs: 1.3 10*3/uL — ABNORMAL LOW (ref 1.7–7.7)
Neutrophils Relative %: 57 %
Platelets: 71 10*3/uL — ABNORMAL LOW (ref 150–400)
RBC: 3.72 MIL/uL — ABNORMAL LOW (ref 3.87–5.11)
RDW: 13.2 % (ref 11.5–15.5)
WBC: 2.4 10*3/uL — ABNORMAL LOW (ref 4.0–10.5)
nRBC: 0 % (ref 0.0–0.2)

## 2020-05-26 LAB — COMPREHENSIVE METABOLIC PANEL
ALT: 37 U/L (ref 0–44)
AST: 41 U/L (ref 15–41)
Albumin: 3.8 g/dL (ref 3.5–5.0)
Alkaline Phosphatase: 61 U/L (ref 38–126)
Anion gap: 9 (ref 5–15)
BUN: 21 mg/dL (ref 8–23)
CO2: 24 mmol/L (ref 22–32)
Calcium: 9.7 mg/dL (ref 8.9–10.3)
Chloride: 102 mmol/L (ref 98–111)
Creatinine, Ser: 1.42 mg/dL — ABNORMAL HIGH (ref 0.44–1.00)
GFR, Estimated: 36 mL/min — ABNORMAL LOW (ref >60)
Glucose, Bld: 147 mg/dL — ABNORMAL HIGH (ref 70–99)
Potassium: 3.9 mmol/L (ref 3.5–5.1)
Sodium: 135 mmol/L (ref 135–145)
Total Bilirubin: 0.6 mg/dL (ref 0.3–1.2)
Total Protein: 7.1 g/dL (ref 6.5–8.1)

## 2020-05-26 MED ORDER — SODIUM CHLORIDE 0.9% FLUSH
10.0000 mL | Freq: Once | INTRAVENOUS | Status: AC
  Administered 2020-05-26: 10 mL via INTRAVENOUS
  Filled 2020-05-26: qty 10

## 2020-05-26 MED ORDER — HEPARIN SOD (PORK) LOCK FLUSH 100 UNIT/ML IV SOLN
500.0000 [IU] | Freq: Once | INTRAVENOUS | Status: AC
  Administered 2020-05-26: 500 [IU] via INTRAVENOUS
  Filled 2020-05-26: qty 5

## 2020-05-26 MED ORDER — HEPARIN SOD (PORK) LOCK FLUSH 100 UNIT/ML IV SOLN
INTRAVENOUS | Status: AC
  Filled 2020-05-26: qty 5

## 2020-06-04 NOTE — Progress Notes (Signed)
[place]  [phone]) [phone] Fax:(336) [phone]  ID: Veronica Colon OB: [birth year]  MR#: [id number]  [id number]#:[id number]  Patient Care Team: Delsa Grana, PA-C as PCP - General (Family Medicine) Marlowe Sax, MD as Referring Physician (Internal Medicine) Birder Robson, MD as Referring Physician (Ophthalmology) Lloyd Huger, MD as Consulting Physician (Hematology and Oncology)  CHIEF COMPLAINT: Stage IV pancreatic cancer.  INTERVAL HISTORY: Patient returns to clinic today for further evaluation and consideration of cycle 2, day 1 of single agent gemcitabine.  She continues to feel well and remains asymptomatic.  Her only complaint is of persistent constipation.  She is tolerating her treatments well.  She has no neurologic complaints.  She denies any recent fevers or illnesses.  She has a good appetite and denies weight loss.  She denies any pain.  She has no chest pain, shortness of breath, cough, or hemoptysis.  She denies any nausea, vomiting, or diarrhea.  She has noted no changes in her bowel movements and denies any melena or hematochezia.  She has no urinary complaints.  Patient offers no further specific complaints today.  REVIEW OF SYSTEMS:   Review of Systems  Constitutional: Negative.  Negative for fever, malaise/fatigue and weight loss.  Respiratory: Negative.  Negative for cough and hemoptysis.   Cardiovascular: Negative.  Negative for chest pain and leg swelling.  Gastrointestinal: Positive for constipation. Negative for abdominal pain, blood in stool and melena.  Genitourinary: Negative.  Negative for dysuria.  Musculoskeletal: Negative.  Negative for back pain.  Skin: Negative.  Negative for rash.  Neurological: Negative.  Negative for dizziness, focal weakness, weakness and headaches.  Psychiatric/Behavioral: Negative.  The patient is not nervous/anxious.     As per HPI. Otherwise, a complete review of systems is  negative.  PAST MEDICAL HISTORY: Past Medical History:  Diagnosis Date  . Allergy   . Anxiety and depression   . Anxiety and depression   . Arthritis   . Breast cancer ([place]) 1990   LT MASTECTOMY  . Cancer ([place]) 1990   LT BREAST MASTECTOMY  . CKD (chronic kidney disease), stage III ([place])   . Diabetes mellitus without complication ([place])   . Family history of breast cancer   . Family history of pancreatic cancer   . GERD (gastroesophageal reflux disease)   . Hypertension   . Hypertensive CKD (chronic kidney disease)   . Personal history of chemotherapy   . Status post chemotherapy    breast cancer  . Temporal arteritis ([place]) 10/28/2016   Overview:  Right temporal arteritis  Biopsy proven 11/08/2016    PAST SURGICAL HISTORY: Past Surgical History:  Procedure Laterality Date  . ABDOMINAL HYSTERECTOMY    . BREAST SURGERY    . COLONOSCOPY WITH PROPOFOL N/A 06/04/2015   Procedure: COLONOSCOPY WITH PROPOFOL;  Surgeon: Manya Silvas, MD;  Location: [facility] ENDOSCOPY;  Service: Endoscopy;  Laterality: N/A;  . IR IMAGING GUIDED PORT INSERTION  05/09/2020  . MASTECTOMY Left 1990   BREAST CA  . TEMPORAL ARTERY BIOPSY / LIGATION  11/2016    FAMILY HISTORY: Family History  Problem Relation Age of Onset  . Breast cancer Mother 32  . Lung cancer Father        lung  . Cancer Maternal Aunt        unk types  . Cancer Maternal Uncle        unk types  . Pancreatic cancer Cousin     ADVANCED DIRECTIVES (Y/N):  N  HEALTH MAINTENANCE:  Social History   Tobacco Use  . Smoking status: Never Smoker  . Smokeless tobacco: Never Used  Vaping Use  . Vaping Use: Never used  Substance Use Topics  . Alcohol use: No    Alcohol/week: 0.0 standard drinks  . Drug use: No     Colonoscopy:  PAP:  Bone density:  Lipid panel:  Allergies  Allergen Reactions  . Hydrochlorothiazide Other (See Comments)    Hypokalemia   . Lisinopril Other (See Comments) and Cough    Hands numb   .  Metformin And Related     Fatigue, "heart was beating slower"    Current Outpatient Medications  Medication Sig Dispense Refill  . acetaminophen (TYLENOL) 325 MG tablet Take 650 mg by mouth every 6 (six) hours as needed for mild pain.    Marland Kitchen ascorbic acid (VITAMIN C) 1000 MG tablet Take by mouth.    Marland Kitchen aspirin 81 MG tablet Take 81 mg by mouth daily.    . fluticasone (FLONASE) 50 MCG/ACT nasal spray Place 2 sprays into both nostrils daily. 48 g 3  . lidocaine-prilocaine (EMLA) cream Apply to port 1 hour prior to appointment. Cover with plastic wrap. 30 g 3  . Multiple Vitamin (MULTIVITAMIN) tablet Take 1 tablet by mouth daily.    Dellia Nims CALCIUM + D3 500-200 MG-UNIT TABS Take 1 tablet by mouth 2 (two) times daily with a meal.  11  . prochlorperazine (COMPAZINE) 10 MG tablet Take 1 tablet (10 mg total) by mouth every 6 (six) hours as needed (Nausea or vomiting). 60 tablet 3  . spironolactone (ALDACTONE) 50 MG tablet TAKE 1 TABLET BY MOUTH EVERY DAY 90 tablet 3   No current facility-administered medications for this visit.   Facility-Administered Medications Ordered in Other Visits  Medication Dose Route Frequency Provider Last Rate Last Admin  . gemcitabine (GEMZAR) 1,800 mg in sodium chloride 0.9 % 250 mL chemo infusion  1,800 mg Intravenous Once Lloyd Huger, MD      . heparin lock flush 100 unit/mL  500 Units Intracatheter Once PRN Lloyd Huger, MD        OBJECTIVE: Vitals:   06/09/20 0913  BP: 136/71  Pulse: 69  Resp: 16  Temp: (!) 97.5 F (36.4 C)  SpO2: 100%     Body mass index is 27.24 kg/m.    ECOG FS:0 - Asymptomatic  General: Well-developed, well-nourished, no acute distress. Eyes: Pink conjunctiva, anicteric sclera. HEENT: Normocephalic, moist mucous membranes. Lungs: No audible wheezing or coughing. Heart: Regular rate and rhythm. Abdomen: Soft, nontender, no obvious distention. Musculoskeletal: No edema, cyanosis, or clubbing. Neuro: Alert, answering  all questions appropriately. Cranial nerves grossly intact. Skin: No rashes or petechiae noted. Psych: Normal affect.   LAB RESULTS:  Lab Results  Component Value Date   NA 137 06/09/2020   K 4.0 06/09/2020   CL 102 06/09/2020   CO2 27 06/09/2020   GLUCOSE 170 (H) 06/09/2020   BUN 20 06/09/2020   CREATININE 1.37 (H) 06/09/2020   CALCIUM 10.0 06/09/2020   PROT 7.2 06/09/2020   ALBUMIN 4.1 06/09/2020   AST 27 06/09/2020   ALT 23 06/09/2020   ALKPHOS 64 06/09/2020   BILITOT 0.5 06/09/2020   GFRNONAA 38 (L) 06/09/2020   GFRAA 36 (L) 04/11/2020    Lab Results  Component Value Date   WBC 5.5 06/09/2020   NEUTROABS 3.0 06/09/2020   HGB 11.1 (L) 06/09/2020   HCT 34.7 (L) 06/09/2020   MCV 87.8 06/09/2020  PLT 308 06/09/2020     STUDIES: No results found.  ASSESSMENT: Stage IV pancreatic cancer.  PLAN:   1.  Stage IV pancreatic cancer: Omental biopsy confirmed results.  PET scans results from April 09, 2020 reviewed independently and reported as above confirming stage of disease.  Patient CA 19-9 is also significantly elevated at 19,661.  After lengthy discussion with the patient, she wishes to pursue palliative chemotherapy using single agent gemcitabine.  Initial plan was to treat on days 1, 8, and 15 on a 28-day cycle, but given thrombocytopenia patient will now receive treatment on days 1 and 8 every 21 days.  Abraxane is not an option given the national shortage, although this may be resolved in January 2022.Marland Kitchen  We previously discussed the possibility of adding in capecitabine, but will reconsider in the future if patient wishes to pursue a more aggressive stance.  Patient has had port placement.  Proceed with cycle 2, day 1 of treatment today.  Return to clinic in 1 week for further evaluation and consideration of cycle 2, day 8. 2.  MGUS: Patient's most recent M spike is only 0.2.  This is likely clinically insignificant.  No further work-up is necessary. 3.  History  of colon cancer: Patient had stage I colon cancer 8+ years ago 4.  History of breast cancer: 31 years ago.  Unclear stage or type of malignancy. 5.  Renal insufficiency: Possibly secondary to obstruction from omental caking.  Mildly improved.  Monitor. 6.  Leukopenia: Resolved. 7.  Anemia: Hemoglobin improved to 11.1. 8.  Thrombocytopenia: Improved.  Will only give treatment on day 1 and 8 as above.  Patient expressed understanding and was in agreement with this plan. She also understands that She can call clinic at any time with any questions, concerns, or complaints.    Lloyd Huger, MD   06/09/2020 10:36 AM

## 2020-06-09 ENCOUNTER — Inpatient Hospital Stay: Payer: Medicare Other

## 2020-06-09 ENCOUNTER — Inpatient Hospital Stay: Payer: Medicare Other | Admitting: Oncology

## 2020-06-09 ENCOUNTER — Other Ambulatory Visit: Payer: Self-pay

## 2020-06-09 VITALS — BP 136/71 | HR 69 | Temp 97.5°F | Resp 16 | Wt 158.7 lb

## 2020-06-09 DIAGNOSIS — C259 Malignant neoplasm of pancreas, unspecified: Secondary | ICD-10-CM | POA: Diagnosis not present

## 2020-06-09 LAB — CBC WITH DIFFERENTIAL/PLATELET
Abs Immature Granulocytes: 0.04 10*3/uL (ref 0.00–0.07)
Basophils Absolute: 0.1 10*3/uL (ref 0.0–0.1)
Basophils Relative: 1 %
Eosinophils Absolute: 0.1 10*3/uL (ref 0.0–0.5)
Eosinophils Relative: 2 %
HCT: 34.7 % — ABNORMAL LOW (ref 36.0–46.0)
Hemoglobin: 11.1 g/dL — ABNORMAL LOW (ref 12.0–15.0)
Immature Granulocytes: 1 %
Lymphocytes Relative: 26 %
Lymphs Abs: 1.4 10*3/uL (ref 0.7–4.0)
MCH: 28.1 pg (ref 26.0–34.0)
MCHC: 32 g/dL (ref 30.0–36.0)
MCV: 87.8 fL (ref 80.0–100.0)
Monocytes Absolute: 0.9 10*3/uL (ref 0.1–1.0)
Monocytes Relative: 16 %
Neutro Abs: 3 10*3/uL (ref 1.7–7.7)
Neutrophils Relative %: 54 %
Platelets: 308 10*3/uL (ref 150–400)
RBC: 3.95 MIL/uL (ref 3.87–5.11)
RDW: 15.5 % (ref 11.5–15.5)
WBC: 5.5 10*3/uL (ref 4.0–10.5)
nRBC: 0 % (ref 0.0–0.2)

## 2020-06-09 LAB — COMPREHENSIVE METABOLIC PANEL
ALT: 23 U/L (ref 0–44)
AST: 27 U/L (ref 15–41)
Albumin: 4.1 g/dL (ref 3.5–5.0)
Alkaline Phosphatase: 64 U/L (ref 38–126)
Anion gap: 8 (ref 5–15)
BUN: 20 mg/dL (ref 8–23)
CO2: 27 mmol/L (ref 22–32)
Calcium: 10 mg/dL (ref 8.9–10.3)
Chloride: 102 mmol/L (ref 98–111)
Creatinine, Ser: 1.37 mg/dL — ABNORMAL HIGH (ref 0.44–1.00)
GFR, Estimated: 38 mL/min — ABNORMAL LOW (ref >60)
Glucose, Bld: 170 mg/dL — ABNORMAL HIGH (ref 70–99)
Potassium: 4 mmol/L (ref 3.5–5.1)
Sodium: 137 mmol/L (ref 135–145)
Total Bilirubin: 0.5 mg/dL (ref 0.3–1.2)
Total Protein: 7.2 g/dL (ref 6.5–8.1)

## 2020-06-09 MED ORDER — SODIUM CHLORIDE 0.9 % IV SOLN
Freq: Once | INTRAVENOUS | Status: AC
  Filled 2020-06-09: qty 250

## 2020-06-09 MED ORDER — HEPARIN SOD (PORK) LOCK FLUSH 100 UNIT/ML IV SOLN
INTRAVENOUS | Status: AC
  Filled 2020-06-09: qty 5

## 2020-06-09 MED ORDER — LIDOCAINE-PRILOCAINE 2.5-2.5 % EX CREA: TOPICAL_CREAM | CUTANEOUS | 3 refills | Status: DC

## 2020-06-09 MED ORDER — HEPARIN SOD (PORK) LOCK FLUSH 100 UNIT/ML IV SOLN
500.0000 [IU] | Freq: Once | INTRAVENOUS | Status: AC | PRN
  Administered 2020-06-09: 500 [IU]
  Filled 2020-06-09: qty 5

## 2020-06-09 MED ORDER — PROCHLORPERAZINE MALEATE 10 MG PO TABS
10.0000 mg | ORAL_TABLET | Freq: Once | ORAL | Status: AC
  Administered 2020-06-09: 10 mg via ORAL
  Filled 2020-06-09: qty 1

## 2020-06-09 MED ORDER — SODIUM CHLORIDE 0.9 % IV SOLN
1800.0000 mg | Freq: Once | INTRAVENOUS | Status: AC
  Administered 2020-06-09: 1800 mg via INTRAVENOUS
  Filled 2020-06-09: qty 26.3

## 2020-06-09 NOTE — Progress Notes (Signed)
Patient reports constipation today, reports she has not been taking Miralax consistently.

## 2020-06-09 NOTE — Progress Notes (Signed)
Patient stable at discharge.

## 2020-06-10 LAB — CANCER ANTIGEN 19-9: CA 19-9: 13184 U/mL — ABNORMAL HIGH (ref 0–35)

## 2020-06-16 ENCOUNTER — Encounter: Payer: Self-pay | Admitting: Oncology

## 2020-06-16 ENCOUNTER — Inpatient Hospital Stay: Payer: Medicare Other

## 2020-06-16 ENCOUNTER — Inpatient Hospital Stay: Payer: Medicare Other | Attending: Oncology

## 2020-06-16 ENCOUNTER — Inpatient Hospital Stay: Payer: Medicare Other | Admitting: Oncology

## 2020-06-16 VITALS — BP 132/72 | HR 66 | Temp 97.3°F | Resp 20 | Wt 157.6 lb

## 2020-06-16 DIAGNOSIS — K5900 Constipation, unspecified: Secondary | ICD-10-CM | POA: Diagnosis not present

## 2020-06-16 DIAGNOSIS — C259 Malignant neoplasm of pancreas, unspecified: Secondary | ICD-10-CM | POA: Diagnosis not present

## 2020-06-16 DIAGNOSIS — Z85038 Personal history of other malignant neoplasm of large intestine: Secondary | ICD-10-CM | POA: Diagnosis not present

## 2020-06-16 DIAGNOSIS — Z800 Family history of malignant neoplasm of digestive organs: Secondary | ICD-10-CM | POA: Insufficient documentation

## 2020-06-16 DIAGNOSIS — D696 Thrombocytopenia, unspecified: Secondary | ICD-10-CM | POA: Diagnosis not present

## 2020-06-16 DIAGNOSIS — D649 Anemia, unspecified: Secondary | ICD-10-CM | POA: Diagnosis not present

## 2020-06-16 DIAGNOSIS — Z801 Family history of malignant neoplasm of trachea, bronchus and lung: Secondary | ICD-10-CM | POA: Insufficient documentation

## 2020-06-16 DIAGNOSIS — D72819 Decreased white blood cell count, unspecified: Secondary | ICD-10-CM | POA: Insufficient documentation

## 2020-06-16 DIAGNOSIS — C786 Secondary malignant neoplasm of retroperitoneum and peritoneum: Secondary | ICD-10-CM | POA: Insufficient documentation

## 2020-06-16 DIAGNOSIS — E860 Dehydration: Secondary | ICD-10-CM | POA: Diagnosis not present

## 2020-06-16 DIAGNOSIS — F418 Other specified anxiety disorders: Secondary | ICD-10-CM | POA: Diagnosis not present

## 2020-06-16 DIAGNOSIS — I129 Hypertensive chronic kidney disease with stage 1 through stage 4 chronic kidney disease, or unspecified chronic kidney disease: Secondary | ICD-10-CM | POA: Diagnosis not present

## 2020-06-16 DIAGNOSIS — R634 Abnormal weight loss: Secondary | ICD-10-CM | POA: Diagnosis not present

## 2020-06-16 DIAGNOSIS — D472 Monoclonal gammopathy: Secondary | ICD-10-CM | POA: Diagnosis not present

## 2020-06-16 DIAGNOSIS — Z79899 Other long term (current) drug therapy: Secondary | ICD-10-CM | POA: Insufficient documentation

## 2020-06-16 DIAGNOSIS — Z5111 Encounter for antineoplastic chemotherapy: Secondary | ICD-10-CM | POA: Diagnosis not present

## 2020-06-16 DIAGNOSIS — R6881 Early satiety: Secondary | ICD-10-CM | POA: Diagnosis not present

## 2020-06-16 DIAGNOSIS — E119 Type 2 diabetes mellitus without complications: Secondary | ICD-10-CM | POA: Diagnosis not present

## 2020-06-16 DIAGNOSIS — K219 Gastro-esophageal reflux disease without esophagitis: Secondary | ICD-10-CM | POA: Insufficient documentation

## 2020-06-16 DIAGNOSIS — N2889 Other specified disorders of kidney and ureter: Secondary | ICD-10-CM | POA: Diagnosis not present

## 2020-06-16 DIAGNOSIS — N183 Chronic kidney disease, stage 3 unspecified: Secondary | ICD-10-CM | POA: Insufficient documentation

## 2020-06-16 DIAGNOSIS — Z803 Family history of malignant neoplasm of breast: Secondary | ICD-10-CM | POA: Diagnosis not present

## 2020-06-16 DIAGNOSIS — Z7982 Long term (current) use of aspirin: Secondary | ICD-10-CM | POA: Insufficient documentation

## 2020-06-16 LAB — CBC WITH DIFFERENTIAL/PLATELET
Abs Immature Granulocytes: 0.02 10*3/uL (ref 0.00–0.07)
Basophils Absolute: 0 10*3/uL (ref 0.0–0.1)
Basophils Relative: 1 %
Eosinophils Absolute: 0 10*3/uL (ref 0.0–0.5)
Eosinophils Relative: 0 %
HCT: 32.7 % — ABNORMAL LOW (ref 36.0–46.0)
Hemoglobin: 10.7 g/dL — ABNORMAL LOW (ref 12.0–15.0)
Immature Granulocytes: 1 %
Lymphocytes Relative: 38 %
Lymphs Abs: 1.2 10*3/uL (ref 0.7–4.0)
MCH: 28.4 pg (ref 26.0–34.0)
MCHC: 32.7 g/dL (ref 30.0–36.0)
MCV: 86.7 fL (ref 80.0–100.0)
Monocytes Absolute: 0.4 10*3/uL (ref 0.1–1.0)
Monocytes Relative: 12 %
Neutro Abs: 1.5 10*3/uL — ABNORMAL LOW (ref 1.7–7.7)
Neutrophils Relative %: 48 %
Platelets: 160 10*3/uL (ref 150–400)
RBC: 3.77 MIL/uL — ABNORMAL LOW (ref 3.87–5.11)
RDW: 15.1 % (ref 11.5–15.5)
WBC: 3.2 10*3/uL — ABNORMAL LOW (ref 4.0–10.5)
nRBC: 0 % (ref 0.0–0.2)

## 2020-06-16 LAB — COMPREHENSIVE METABOLIC PANEL
ALT: 36 U/L (ref 0–44)
AST: 42 U/L — ABNORMAL HIGH (ref 15–41)
Albumin: 3.9 g/dL (ref 3.5–5.0)
Alkaline Phosphatase: 69 U/L (ref 38–126)
Anion gap: 9 (ref 5–15)
BUN: 20 mg/dL (ref 8–23)
CO2: 26 mmol/L (ref 22–32)
Calcium: 9.6 mg/dL (ref 8.9–10.3)
Chloride: 100 mmol/L (ref 98–111)
Creatinine, Ser: 1.66 mg/dL — ABNORMAL HIGH (ref 0.44–1.00)
GFR, Estimated: 30 mL/min — ABNORMAL LOW (ref >60)
Glucose, Bld: 189 mg/dL — ABNORMAL HIGH (ref 70–99)
Potassium: 3.9 mmol/L (ref 3.5–5.1)
Sodium: 135 mmol/L (ref 135–145)
Total Bilirubin: 0.6 mg/dL (ref 0.3–1.2)
Total Protein: 7.2 g/dL (ref 6.5–8.1)

## 2020-06-16 MED ORDER — HEPARIN SOD (PORK) LOCK FLUSH 100 UNIT/ML IV SOLN
500.0000 [IU] | Freq: Once | INTRAVENOUS | Status: AC | PRN
  Administered 2020-06-16: 500 [IU]
  Filled 2020-06-16: qty 5

## 2020-06-16 MED ORDER — SODIUM CHLORIDE 0.9 % IV SOLN
Freq: Once | INTRAVENOUS | Status: AC
  Filled 2020-06-16: qty 250

## 2020-06-16 MED ORDER — PROCHLORPERAZINE MALEATE 10 MG PO TABS
10.0000 mg | ORAL_TABLET | Freq: Once | ORAL | Status: AC
  Administered 2020-06-16: 10 mg via ORAL
  Filled 2020-06-16: qty 1

## 2020-06-16 MED ORDER — SODIUM CHLORIDE 0.9 % IV SOLN
1800.0000 mg | Freq: Once | INTRAVENOUS | Status: AC
  Administered 2020-06-16: 1800 mg via INTRAVENOUS
  Filled 2020-06-16: qty 26.3

## 2020-06-16 MED ORDER — HEPARIN SOD (PORK) LOCK FLUSH 100 UNIT/ML IV SOLN
INTRAVENOUS | Status: AC
  Filled 2020-06-16: qty 5

## 2020-06-16 NOTE — Progress Notes (Signed)
Per Vaughan Browner, Sonia Baller NP states ok to proceed with tx today with crt 1.66

## 2020-06-16 NOTE — Progress Notes (Signed)
Patient denies any concerns today.  

## 2020-06-16 NOTE — Progress Notes (Signed)
[place]  Telephone:(336) [phone] Fax:(336) [phone]  ID: Veronica Colon OB: 11-Dec-1931  MR#: [id number]  QJJ#:[id number]  Patient Care Team: Delsa Grana, PA-C as PCP - General (Family Medicine) Marlowe Sax, MD as Referring Physician (Internal Medicine) Birder Robson, MD as Referring Physician (Ophthalmology) Lloyd Huger, MD as Consulting Physician (Hematology and Oncology)  CHIEF COMPLAINT: Stage IV pancreatic cancer.  INTERVAL HISTORY: Patient returns to clinic today for further evaluation and consideration of cycle 2, day 8 of single agent gemcitabine.  She continues to feel well and remains asymptomatic.  She is with her daughter today.  Previously mentioned constipation has now resolved with daily use of MiraLAX.  She is drinking fluids as often as she is able.  She occasionally uses ensures and boost supplements.  Endorses early satiety.  She denies any pain, shortness of breath, cough or hemoptysis.  She denies any nausea, vomiting or diarrhea.  Denies any melena or hematochezia.  Overall feeling well.  REVIEW OF SYSTEMS:   Review of Systems  Constitutional: Negative.  Negative for fever, malaise/fatigue and weight loss.  Respiratory: Negative.  Negative for cough and hemoptysis.   Cardiovascular: Negative.  Negative for chest pain and leg swelling.  Gastrointestinal: Positive for heartburn. Negative for abdominal pain, blood in stool, constipation and melena.       Early satiety  Genitourinary: Negative.  Negative for dysuria.  Musculoskeletal: Negative.  Negative for back pain.  Skin: Negative.  Negative for rash.  Neurological: Negative.  Negative for dizziness, focal weakness, weakness and headaches.  Psychiatric/Behavioral: Negative.  The patient is not nervous/anxious.     As per HPI. Otherwise, a complete review of systems is negative.  PAST MEDICAL HISTORY: Past Medical History:  Diagnosis Date  . Allergy   . Anxiety and  depression   . Anxiety and depression   . Arthritis   . Breast cancer ([place]) 1990   LT MASTECTOMY  . Cancer ([place]) 1990   LT BREAST MASTECTOMY  . CKD (chronic kidney disease), stage III ([place])   . Diabetes mellitus without complication ([place])   . Family history of breast cancer   . Family history of pancreatic cancer   . GERD (gastroesophageal reflux disease)   . Hypertension   . Hypertensive CKD (chronic kidney disease)   . Personal history of chemotherapy   . Status post chemotherapy    breast cancer  . Temporal arteritis ([place]) 10/28/2016   Overview:  Right temporal arteritis  Biopsy proven 11/08/2016    PAST SURGICAL HISTORY: Past Surgical History:  Procedure Laterality Date  . ABDOMINAL HYSTERECTOMY    . BREAST SURGERY    . COLONOSCOPY WITH PROPOFOL N/A 06/04/2015   Procedure: COLONOSCOPY WITH PROPOFOL;  Surgeon: Manya Silvas, MD;  Location: [facility] ENDOSCOPY;  Service: Endoscopy;  Laterality: N/A;  . IR IMAGING GUIDED PORT INSERTION  05/09/2020  . MASTECTOMY Left 1990   BREAST CA  . TEMPORAL ARTERY BIOPSY / LIGATION  11/2016    FAMILY HISTORY: Family History  Problem Relation Age of Onset  . Breast cancer Mother 57  . Lung cancer Father        lung  . Cancer Maternal Aunt        unk types  . Cancer Maternal Uncle        unk types  . Pancreatic cancer Cousin     ADVANCED DIRECTIVES (Y/N):  N  HEALTH MAINTENANCE: Social History   Tobacco Use  . Smoking status: Never Smoker  .  Smokeless tobacco: Never Used  Vaping Use  . Vaping Use: Never used  Substance Use Topics  . Alcohol use: No    Alcohol/week: 0.0 standard drinks  . Drug use: No     Colonoscopy:  PAP:  Bone density:  Lipid panel:  Allergies  Allergen Reactions  . Hydrochlorothiazide Other (See Comments)    Hypokalemia   . Lisinopril Other (See Comments) and Cough    Hands numb   . Metformin And Related     Fatigue, "heart was beating slower"    Current Outpatient Medications    Medication Sig Dispense Refill  . acetaminophen (TYLENOL) 325 MG tablet Take 650 mg by mouth every 6 (six) hours as needed for mild pain.    Marland Kitchen ascorbic acid (VITAMIN C) 1000 MG tablet Take by mouth.    Marland Kitchen aspirin 81 MG tablet Take 81 mg by mouth daily.    . fluticasone (FLONASE) 50 MCG/ACT nasal spray Place 2 sprays into both nostrils daily. 48 g 3  . lidocaine-prilocaine (EMLA) cream Apply to port 1 hour prior to appointment. Cover with plastic wrap. 30 g 3  . Multiple Vitamin (MULTIVITAMIN) tablet Take 1 tablet by mouth daily.    Dellia Nims CALCIUM + D3 500-200 MG-UNIT TABS Take 1 tablet by mouth 2 (two) times daily with a meal.  11  . prochlorperazine (COMPAZINE) 10 MG tablet Take 1 tablet (10 mg total) by mouth every 6 (six) hours as needed (Nausea or vomiting). 60 tablet 3  . spironolactone (ALDACTONE) 50 MG tablet TAKE 1 TABLET BY MOUTH EVERY DAY 90 tablet 3   No current facility-administered medications for this visit.    OBJECTIVE: There were no vitals filed for this visit.   There is no height or weight on file to calculate BMI.    ECOG FS:0 - Asymptomatic  Physical Exam Constitutional:      Appearance: Normal appearance.  HENT:     Head: Normocephalic and atraumatic.  Eyes:     Pupils: Pupils are equal, round, and reactive to light.  Cardiovascular:     Rate and Rhythm: Normal rate and regular rhythm.     Heart sounds: Normal heart sounds. No murmur heard.   Pulmonary:     Effort: Pulmonary effort is normal.     Breath sounds: Normal breath sounds. No wheezing.  Abdominal:     General: Bowel sounds are normal. There is no distension.     Palpations: Abdomen is soft.     Tenderness: There is no abdominal tenderness.  Musculoskeletal:        General: Normal range of motion.     Cervical back: Normal range of motion.  Skin:    General: Skin is warm and dry.     Findings: No rash.  Neurological:     Mental Status: She is alert and oriented to person, place, and time.   Psychiatric:        Judgment: Judgment normal.      LAB RESULTS:  Lab Results  Component Value Date   NA 137 06/09/2020   K 4.0 06/09/2020   CL 102 06/09/2020   CO2 27 06/09/2020   GLUCOSE 170 (H) 06/09/2020   BUN 20 06/09/2020   CREATININE 1.37 (H) 06/09/2020   CALCIUM 10.0 06/09/2020   PROT 7.2 06/09/2020   ALBUMIN 4.1 06/09/2020   AST 27 06/09/2020   ALT 23 06/09/2020   ALKPHOS 64 06/09/2020   BILITOT 0.5 06/09/2020   GFRNONAA 38 (L) 06/09/2020  GFRAA 36 (L) 04/11/2020    Lab Results  Component Value Date   WBC 5.5 06/09/2020   NEUTROABS 3.0 06/09/2020   HGB 11.1 (L) 06/09/2020   HCT 34.7 (L) 06/09/2020   MCV 87.8 06/09/2020   PLT 308 06/09/2020     STUDIES: No results found.  Oncology history: Stage IV pancreatic cancer: Omental biopsy confirmed results.  PET scans results from April 09, 2020 reviewed independently and reported as above confirming stage of disease.  Patient CA 19-9 is also significantly elevated at 19,661.  After lengthy discussion with the patient, she wishes to pursue palliative chemotherapy using single agent gemcitabine.  Initial plan was to treat on days 1, 8, and 15 on a 28-day cycle, but given thrombocytopenia patient will now receive treatment on days 1 and 8 every 21 days.  Abraxane is not an option given the national shortage, although this may be resolved in January 2022.Marland Kitchen  We previously discussed the possibility of adding in capecitabine, but will reconsider in the future if patient wishes to pursue a more aggressive stance.  Patient has had port placement.  ASSESSMENT: Stage IV pancreatic cancer.  PLAN:   1.  Stage IV pancreatic cancer: -She is status post 1 cycle of gemcitabine.  -Received cycle 2-day 1 gemcitabine last week.  She is due for cycle 2-day 8 today.   -Appears to be tolerating well.  -Lab work from today is relatively unremarkable.  Slight bump in creatinine 1.66  (1.037).  -Proceed with cycle 2-day 8  today.  2.  MGUS:  -Patient's most recent M spike is only 0.2 -This is likely clinically insignificant.   -No further work-up is necessary.  3.  History of colon cancer:  -Patient had stage I colon cancer 8+ years ago  4.  History of breast cancer:  -31 years ago.   -Unclear stage or type of malignancy.  5.  Renal insufficiency:  -Possibly secondary to obstruction from omental caking.  -Slightly worse today.  Creatinine 1.66  (1.37 ) -We will give half a liter of normal saline with treatment today.    6.  Leukopenia:  -Secondary to treatment. -WBC 3.2 and ANC 1500. -Proceed with treatment. -Continue to monitor.  7.  Anemia:  -Stable.   -Hemoglobin 10.7 (11.1) . 8.  Thrombocytopenia:  -Improved.   -Continue with treatment on day 1 and day 8 only.   Disposition: -Proceed with treatment today. -Return to clinic in 2 weeks for labs, assessment and cycle 3-day 1 gemcitabine.  Greater than 50% was spent in counseling and coordination of care with this patient including but not limited to discussion of the relevant topics above (See A&P) including, but not limited to diagnosis and management of acute and chronic medical conditions.   Patient expressed understanding and was in agreement with this plan. She also understands that She can call clinic at any time with any questions, concerns, or complaints.    Jacquelin Hawking, NP   06/16/2020 8:55 AM

## 2020-06-16 NOTE — Progress Notes (Signed)
Pt received keytruda infusion today and 500cc of hydration IVF. Tolerated well. Ambulatory at d/c.

## 2020-06-23 ENCOUNTER — Inpatient Hospital Stay: Payer: Medicare Other | Admitting: Oncology

## 2020-06-23 ENCOUNTER — Inpatient Hospital Stay: Payer: Medicare Other

## 2020-06-29 NOTE — Progress Notes (Signed)
[place]  [phone]) [phone] Fax:(336) [phone]  ID: Veronica Colon OB: July 29, 1931  MR#: [id number]  ALP#:[id number]  Patient Care Team: Delsa Grana, PA-C as PCP - General (Family Medicine) Marlowe Sax, MD as Referring Physician (Internal Medicine) Birder Robson, MD as Referring Physician (Ophthalmology) Lloyd Huger, MD as Consulting Physician (Hematology and Oncology)  CHIEF COMPLAINT: Stage IV pancreatic cancer.  INTERVAL HISTORY: Patient returns to clinic today for further evaluation and consideration of cycle 3, day 1 of single agent gemcitabine.  She is tolerating her treatments well without significant side effects.  She currently feels well and is asymptomatic.  Her constipation is better controlled.  She has no neurologic complaints.  She denies any recent fevers or illnesses.  She has a good appetite and denies weight loss.  She denies any pain.  She has no chest pain, shortness of breath, cough, or hemoptysis.  She denies any nausea, vomiting, or diarrhea.  She has noted no changes in her bowel movements and denies any melena or hematochezia.  She has no urinary complaints.  Patient offers no specific complaints today.  REVIEW OF SYSTEMS:   Review of Systems  Constitutional: Negative.  Negative for fever, malaise/fatigue and weight loss.  Respiratory: Negative.  Negative for cough and hemoptysis.   Cardiovascular: Negative.  Negative for chest pain and leg swelling.  Gastrointestinal: Negative.  Negative for abdominal pain, blood in stool, constipation and melena.  Genitourinary: Negative.  Negative for dysuria.  Musculoskeletal: Negative.  Negative for back pain.  Skin: Negative.  Negative for rash.  Neurological: Negative.  Negative for dizziness, focal weakness, weakness and headaches.  Psychiatric/Behavioral: Negative.  The patient is not nervous/anxious.     As per HPI. Otherwise, a complete review of systems is  negative.  PAST MEDICAL HISTORY: Past Medical History:  Diagnosis Date  . Allergy   . Anxiety and depression   . Anxiety and depression   . Arthritis   . Breast cancer ([place]) 1990   LT MASTECTOMY  . Cancer ([place]) 1990   LT BREAST MASTECTOMY  . CKD (chronic kidney disease), stage III ([place])   . Diabetes mellitus without complication ([place])   . Family history of breast cancer   . Family history of pancreatic cancer   . GERD (gastroesophageal reflux disease)   . Hypertension   . Hypertensive CKD (chronic kidney disease)   . Personal history of chemotherapy   . Status post chemotherapy    breast cancer  . Temporal arteritis ([place]) 10/28/2016   Overview:  Right temporal arteritis  Biopsy proven 11/08/2016    PAST SURGICAL HISTORY: Past Surgical History:  Procedure Laterality Date  . ABDOMINAL HYSTERECTOMY    . BREAST SURGERY    . COLONOSCOPY WITH PROPOFOL N/A 06/04/2015   Procedure: COLONOSCOPY WITH PROPOFOL;  Surgeon: Manya Silvas, MD;  Location: [facility] ENDOSCOPY;  Service: Endoscopy;  Laterality: N/A;  . IR IMAGING GUIDED PORT INSERTION  05/09/2020  . MASTECTOMY Left 1990   BREAST CA  . TEMPORAL ARTERY BIOPSY / LIGATION  11/2016    FAMILY HISTORY: Family History  Problem Relation Age of Onset  . Breast cancer Mother 21  . Lung cancer Father        lung  . Cancer Maternal Aunt        unk types  . Cancer Maternal Uncle        unk types  . Pancreatic cancer Cousin     ADVANCED DIRECTIVES (Y/N):  N  HEALTH MAINTENANCE:  Social History   Tobacco Use  . Smoking status: Never Smoker  . Smokeless tobacco: Never Used  Vaping Use  . Vaping Use: Never used  Substance Use Topics  . Alcohol use: No    Alcohol/week: 0.0 standard drinks  . Drug use: No     Colonoscopy:  PAP:  Bone density:  Lipid panel:  Allergies  Allergen Reactions  . Hydrochlorothiazide Other (See Comments)    Hypokalemia   . Lisinopril Other (See Comments) and Cough    Hands numb   .  Metformin And Related     Fatigue, "heart was beating slower"    Current Outpatient Medications  Medication Sig Dispense Refill  . acetaminophen (TYLENOL) 325 MG tablet Take 650 mg by mouth every 6 (six) hours as needed for mild pain.    Marland Kitchen ascorbic acid (VITAMIN C) 1000 MG tablet Take by mouth.    Marland Kitchen aspirin 81 MG tablet Take 81 mg by mouth daily.    . fluticasone (FLONASE) 50 MCG/ACT nasal spray Place 2 sprays into both nostrils daily. 48 g 3  . lidocaine-prilocaine (EMLA) cream Apply to port 1 hour prior to appointment. Cover with plastic wrap. 30 g 3  . Multiple Vitamin (MULTIVITAMIN) tablet Take 1 tablet by mouth daily.    Dellia Nims CALCIUM + D3 500-200 MG-UNIT TABS Take 1 tablet by mouth 2 (two) times daily with a meal.  11  . prochlorperazine (COMPAZINE) 10 MG tablet Take 1 tablet (10 mg total) by mouth every 6 (six) hours as needed (Nausea or vomiting). 60 tablet 3  . spironolactone (ALDACTONE) 50 MG tablet TAKE 1 TABLET BY MOUTH EVERY DAY 90 tablet 3   No current facility-administered medications for this visit.    OBJECTIVE: Vitals:   07/01/20 0932  BP: (!) 133/57  Pulse: 75  Resp: 20  SpO2: 100%     Body mass index is 26.31 kg/m.    ECOG FS:0 - Asymptomatic  General: Well-developed, well-nourished, no acute distress. Eyes: Pink conjunctiva, anicteric sclera. HEENT: Normocephalic, moist mucous membranes. Lungs: No audible wheezing or coughing. Heart: Regular rate and rhythm. Abdomen: Soft, nontender, no obvious distention. Musculoskeletal: No edema, cyanosis, or clubbing. Neuro: Alert, answering all questions appropriately. Cranial nerves grossly intact. Skin: No rashes or petechiae noted. Psych: Normal affect.   LAB RESULTS:  Lab Results  Component Value Date   NA 135 07/01/2020   K 3.8 07/01/2020   CL 100 07/01/2020   CO2 24 07/01/2020   GLUCOSE 174 (H) 07/01/2020   BUN 21 07/01/2020   CREATININE 1.63 (H) 07/01/2020   CALCIUM 10.0 07/01/2020   PROT 7.2  07/01/2020   ALBUMIN 4.1 07/01/2020   AST 36 07/01/2020   ALT 31 07/01/2020   ALKPHOS 58 07/01/2020   BILITOT 0.8 07/01/2020   GFRNONAA 31 (L) 07/01/2020   GFRAA 36 (L) 04/11/2020    Lab Results  Component Value Date   WBC 5.7 07/01/2020   NEUTROABS 3.9 07/01/2020   HGB 10.4 (L) 07/01/2020   HCT 31.6 (L) 07/01/2020   MCV 87.8 07/01/2020   PLT 291 07/01/2020     STUDIES: No results found.  ASSESSMENT: Stage IV pancreatic cancer.  PLAN:   1.  Stage IV pancreatic cancer: Omental biopsy confirmed results.  PET scans results from April 09, 2020 reviewed independently confirming stage of disease.  Patient CA 19-9 is also significantly elevated at 19,661, but now has trended down to 10,379.  After lengthy discussion with the patient, she wishes to  pursue palliative chemotherapy using single agent gemcitabine.  Initial plan was to treat on days 1, 8, and 15 on a 28-day cycle, but given thrombocytopenia patient will now receive treatment on days 1 and 8 every 21 days.  Abraxane is not an option given the national shortage, although this may be resolved in January 2022 and can add in later.  We also previously discussed the possibility of adding in capecitabine, but will reconsider in the future if patient wishes to pursue a more aggressive stance.  Patient has had port placement.  Proceed with cycle 3, day 1 of gemcitabine today.  Return to clinic in 1 week for further evaluation and consideration of cycle 3, day 8. 2.  MGUS: Patient's most recent M spike is only 0.2.  This is likely clinically insignificant.  No further work-up is necessary. 3.  History of colon cancer: Patient had stage I colon cancer 8+ years ago 4.  History of breast cancer: 31 years ago.  Unclear stage or type of malignancy. 5.  Renal insufficiency: Chronic and unchanged.  Possibly secondary to obstruction from omental caking.  Mildly improved.  Monitor. 6.  Leukopenia: Resolved. 7.  Anemia: Chronic and unchanged.   Patient's hemoglobin is 10.4. 8.  Thrombocytopenia: Resolved.  Will only give treatment on day 1 and 8 as above.  Patient expressed understanding and was in agreement with this plan. She also understands that She can call clinic at any time with any questions, concerns, or complaints.    Lloyd Huger, MD   07/02/2020 9:17 AM

## 2020-07-01 ENCOUNTER — Encounter: Payer: Self-pay | Admitting: Oncology

## 2020-07-01 ENCOUNTER — Other Ambulatory Visit: Payer: Self-pay

## 2020-07-01 ENCOUNTER — Inpatient Hospital Stay: Payer: Medicare Other

## 2020-07-01 ENCOUNTER — Inpatient Hospital Stay: Payer: Medicare Other | Admitting: Oncology

## 2020-07-01 VITALS — BP 125/63 | HR 56 | Resp 18

## 2020-07-01 VITALS — BP 133/57 | HR 75 | Resp 20 | Wt 153.3 lb

## 2020-07-01 DIAGNOSIS — C259 Malignant neoplasm of pancreas, unspecified: Secondary | ICD-10-CM

## 2020-07-01 LAB — COMPREHENSIVE METABOLIC PANEL
ALT: 31 U/L (ref 0–44)
AST: 36 U/L (ref 15–41)
Albumin: 4.1 g/dL (ref 3.5–5.0)
Alkaline Phosphatase: 58 U/L (ref 38–126)
Anion gap: 11 (ref 5–15)
BUN: 21 mg/dL (ref 8–23)
CO2: 24 mmol/L (ref 22–32)
Calcium: 10 mg/dL (ref 8.9–10.3)
Chloride: 100 mmol/L (ref 98–111)
Creatinine, Ser: 1.63 mg/dL — ABNORMAL HIGH (ref 0.44–1.00)
GFR, Estimated: 31 mL/min — ABNORMAL LOW (ref >60)
Glucose, Bld: 174 mg/dL — ABNORMAL HIGH (ref 70–99)
Potassium: 3.8 mmol/L (ref 3.5–5.1)
Sodium: 135 mmol/L (ref 135–145)
Total Bilirubin: 0.8 mg/dL (ref 0.3–1.2)
Total Protein: 7.2 g/dL (ref 6.5–8.1)

## 2020-07-01 LAB — CBC WITH DIFFERENTIAL/PLATELET
Abs Immature Granulocytes: 0.01 10*3/uL (ref 0.00–0.07)
Basophils Absolute: 0 10*3/uL (ref 0.0–0.1)
Basophils Relative: 0 %
Eosinophils Absolute: 0.1 10*3/uL (ref 0.0–0.5)
Eosinophils Relative: 2 %
HCT: 31.6 % — ABNORMAL LOW (ref 36.0–46.0)
Hemoglobin: 10.4 g/dL — ABNORMAL LOW (ref 12.0–15.0)
Immature Granulocytes: 0 %
Lymphocytes Relative: 17 %
Lymphs Abs: 1 10*3/uL (ref 0.7–4.0)
MCH: 28.9 pg (ref 26.0–34.0)
MCHC: 32.9 g/dL (ref 30.0–36.0)
MCV: 87.8 fL (ref 80.0–100.0)
Monocytes Absolute: 0.7 10*3/uL (ref 0.1–1.0)
Monocytes Relative: 12 %
Neutro Abs: 3.9 10*3/uL (ref 1.7–7.7)
Neutrophils Relative %: 69 %
Platelets: 291 10*3/uL (ref 150–400)
RBC: 3.6 MIL/uL — ABNORMAL LOW (ref 3.87–5.11)
RDW: 17.2 % — ABNORMAL HIGH (ref 11.5–15.5)
WBC: 5.7 10*3/uL (ref 4.0–10.5)
nRBC: 0 % (ref 0.0–0.2)

## 2020-07-01 MED ORDER — SODIUM CHLORIDE 0.9 % IV SOLN
1800.0000 mg | Freq: Once | INTRAVENOUS | Status: AC
  Administered 2020-07-01: 11:00:00 1800 mg via INTRAVENOUS
  Filled 2020-07-01: qty 26.3

## 2020-07-01 MED ORDER — PROCHLORPERAZINE MALEATE 10 MG PO TABS
10.0000 mg | ORAL_TABLET | Freq: Once | ORAL | Status: AC
  Administered 2020-07-01: 10:00:00 10 mg via ORAL
  Filled 2020-07-01: qty 1

## 2020-07-01 MED ORDER — HEPARIN SOD (PORK) LOCK FLUSH 100 UNIT/ML IV SOLN
500.0000 [IU] | Freq: Once | INTRAVENOUS | Status: AC
  Administered 2020-07-01: 11:00:00 500 [IU] via INTRAVENOUS
  Filled 2020-07-01: qty 5

## 2020-07-01 MED ORDER — HEPARIN SOD (PORK) LOCK FLUSH 100 UNIT/ML IV SOLN
INTRAVENOUS | Status: AC
  Filled 2020-07-01: qty 5

## 2020-07-01 MED ORDER — SODIUM CHLORIDE 0.9% FLUSH
10.0000 mL | Freq: Once | INTRAVENOUS | Status: AC
  Administered 2020-07-01: 09:00:00 10 mL via INTRAVENOUS
  Filled 2020-07-01: qty 10

## 2020-07-01 MED ORDER — SODIUM CHLORIDE 0.9 % IV SOLN
Freq: Once | INTRAVENOUS | Status: AC
  Filled 2020-07-01: qty 250

## 2020-07-01 NOTE — Progress Notes (Signed)
Patient tolerated infusion well. Patient and VSS. Discharged home  

## 2020-07-02 LAB — CANCER ANTIGEN 19-9: CA 19-9: 10379 U/mL — ABNORMAL HIGH (ref 0–35)

## 2020-07-06 NOTE — Progress Notes (Signed)
[place]  Telephone:(336) [phone] Fax:(336) [phone]  ID: Alexa Blish Boileau OB: 03-01-1932  MR#: [id number]  NGE#:[id number]  Patient Care Team: Delsa Grana, PA-C as PCP - General (Family Medicine) Marlowe Sax, MD as Referring Physician (Internal Medicine) Birder Robson, MD as Referring Physician (Ophthalmology) Lloyd Huger, MD as Consulting Physician (Hematology and Oncology)  CHIEF COMPLAINT: Stage IV pancreatic cancer.  INTERVAL HISTORY: Patient returns to clinic today for further evaluation and consideration of cycle 3, day 8 of single agent gemcitabine.  She has a good appetite, but her weight has trended down over the past 4 to 6 weeks.  She is otherwise tolerating her treatments well without significant side effects. Her constipation is better controlled.  She has no neurologic complaints.  She denies any recent fevers or illnesses.  She denies any pain.  She has no chest pain, shortness of breath, cough, or hemoptysis.  She denies any nausea, vomiting, or diarrhea.  She has noted no changes in her bowel movements and denies any melena or hematochezia.  She has no urinary complaints.  Patient offers no further specific complaints today.  REVIEW OF SYSTEMS:   Review of Systems  Constitutional: Positive for weight loss. Negative for fever and malaise/fatigue.  Respiratory: Negative.  Negative for cough and hemoptysis.   Cardiovascular: Negative.  Negative for chest pain and leg swelling.  Gastrointestinal: Negative.  Negative for abdominal pain, blood in stool, constipation and melena.  Genitourinary: Negative.  Negative for dysuria.  Musculoskeletal: Negative.  Negative for back pain.  Skin: Negative.  Negative for rash.  Neurological: Negative.  Negative for dizziness, focal weakness, weakness and headaches.  Psychiatric/Behavioral: Negative.  The patient is not nervous/anxious.     As per HPI. Otherwise, a complete review of systems is  negative.  PAST MEDICAL HISTORY: Past Medical History:  Diagnosis Date  . Allergy   . Anxiety and depression   . Anxiety and depression   . Arthritis   . Breast cancer ([place]) 1990   LT MASTECTOMY  . Cancer ([place]) 1990   LT BREAST MASTECTOMY  . CKD (chronic kidney disease), stage III ([place])   . Diabetes mellitus without complication ([place])   . Family history of breast cancer   . Family history of pancreatic cancer   . GERD (gastroesophageal reflux disease)   . Hypertension   . Hypertensive CKD (chronic kidney disease)   . Personal history of chemotherapy   . Status post chemotherapy    breast cancer  . Temporal arteritis ([place]) 10/28/2016   Overview:  Right temporal arteritis  Biopsy proven 11/08/2016    PAST SURGICAL HISTORY: Past Surgical History:  Procedure Laterality Date  . ABDOMINAL HYSTERECTOMY    . BREAST SURGERY    . COLONOSCOPY WITH PROPOFOL N/A 06/04/2015   Procedure: COLONOSCOPY WITH PROPOFOL;  Surgeon: Manya Silvas, MD;  Location: [facility] ENDOSCOPY;  Service: Endoscopy;  Laterality: N/A;  . IR IMAGING GUIDED PORT INSERTION  05/09/2020  . MASTECTOMY Left 1990   BREAST CA  . TEMPORAL ARTERY BIOPSY / LIGATION  11/2016    FAMILY HISTORY: Family History  Problem Relation Age of Onset  . Breast cancer Mother 77  . Lung cancer Father        lung  . Cancer Maternal Aunt        unk types  . Cancer Maternal Uncle        unk types  . Pancreatic cancer Cousin     ADVANCED DIRECTIVES (Y/N):  N  HEALTH MAINTENANCE: Social History   Tobacco Use  . Smoking status: Never Smoker  . Smokeless tobacco: Never Used  Vaping Use  . Vaping Use: Never used  Substance Use Topics  . Alcohol use: No    Alcohol/week: 0.0 standard drinks  . Drug use: No     Colonoscopy:  PAP:  Bone density:  Lipid panel:  Allergies  Allergen Reactions  . Hydrochlorothiazide Other (See Comments)    Hypokalemia   . Lisinopril Other (See Comments) and Cough    Hands numb   .  Metformin And Related     Fatigue, "heart was beating slower"    Current Outpatient Medications  Medication Sig Dispense Refill  . acetaminophen (TYLENOL) 325 MG tablet Take 650 mg by mouth every 6 (six) hours as needed for mild pain.    Marland Kitchen ascorbic acid (VITAMIN C) 1000 MG tablet Take by mouth.    Marland Kitchen aspirin 81 MG tablet Take 81 mg by mouth daily.    . fluticasone (FLONASE) 50 MCG/ACT nasal spray Place 2 sprays into both nostrils daily. 48 g 3  . Multiple Vitamin (MULTIVITAMIN) tablet Take 1 tablet by mouth daily.    Dellia Nims CALCIUM + D3 500-200 MG-UNIT TABS Take 1 tablet by mouth 2 (two) times daily with a meal.  11  . prochlorperazine (COMPAZINE) 10 MG tablet Take 1 tablet (10 mg total) by mouth every 6 (six) hours as needed (Nausea or vomiting). 60 tablet 3  . spironolactone (ALDACTONE) 50 MG tablet TAKE 1 TABLET BY MOUTH EVERY DAY 90 tablet 3  . lidocaine-prilocaine (EMLA) cream Apply to port 1 hour prior to appointment. Cover with plastic wrap. 30 g 3   No current facility-administered medications for this visit.   Facility-Administered Medications Ordered in Other Visits  Medication Dose Route Frequency Provider Last Rate Last Admin  . sodium chloride flush (NS) 0.9 % injection 10 mL  10 mL Intracatheter PRN Lloyd Huger, MD   10 mL at 07/09/20 0910    OBJECTIVE: Vitals:   07/09/20 0921  BP: 129/62  Pulse: 66  Resp: 18  Temp: 98 F (36.7 C)  SpO2: 100%     Body mass index is 26.47 kg/m.    ECOG FS:0 - Asymptomatic  General: Thin, no acute distress. Eyes: Pink conjunctiva, anicteric sclera. HEENT: Normocephalic, moist mucous membranes. Lungs: No audible wheezing or coughing. Heart: Regular rate and rhythm. Abdomen: Soft, nontender, no obvious distention. Musculoskeletal: No edema, cyanosis, or clubbing. Neuro: Alert, answering all questions appropriately. Cranial nerves grossly intact. Skin: No rashes or petechiae noted. Psych: Normal affect.   LAB  RESULTS:  Lab Results  Component Value Date   NA 136 07/09/2020   K 3.7 07/09/2020   CL 103 07/09/2020   CO2 25 07/09/2020   GLUCOSE 175 (H) 07/09/2020   BUN 17 07/09/2020   CREATININE 1.30 (H) 07/09/2020   CALCIUM 9.6 07/09/2020   PROT 6.9 07/09/2020   ALBUMIN 3.7 07/09/2020   AST 47 (H) 07/09/2020   ALT 56 (H) 07/09/2020   ALKPHOS 87 07/09/2020   BILITOT 0.6 07/09/2020   GFRNONAA 40 (L) 07/09/2020   GFRAA 36 (L) 04/11/2020    Lab Results  Component Value Date   WBC 5.1 07/09/2020   NEUTROABS 3.0 07/09/2020   HGB 9.8 (L) 07/09/2020   HCT 30.7 (L) 07/09/2020   MCV 89.5 07/09/2020   PLT 176 07/09/2020     STUDIES: No results found.  ASSESSMENT: Stage IV pancreatic cancer.  PLAN:  1.  Stage IV pancreatic cancer: Omental biopsy confirmed results.  PET scans results from April 09, 2020 reviewed independently confirming stage of disease.  Patient CA 19-9 is also significantly elevated at 19,661, but now has trended down to 10,379.  After lengthy discussion with the patient, she wishes to pursue palliative chemotherapy using single agent gemcitabine.  Initial plan was to treat on days 1, 8, and 15 on a 28-day cycle, but given thrombocytopenia patient will now receive treatment on days 1 and 8 every 21 days.  Abraxane is not an option given the national shortage, although this may be resolved in January 2022 and can add in later.  We also previously discussed the possibility of adding in capecitabine, but will reconsider in the future if patient wishes to pursue a more aggressive stance.  Patient has had port placement.  Proceed with cycle 3, day 8 of gemcitabine today.  Return to clinic in 2 weeks for further evaluation and consideration of cycle 4, day 1.   2.  MGUS: Patient's most recent M spike is only 0.2.  This is likely clinically insignificant.  No further work-up is necessary. 3.  History of colon cancer: Patient had stage I colon cancer 8+ years ago 4.  History of  breast cancer: 31 years ago.  Unclear stage or type of malignancy. 5.  Renal insufficiency: Mildly improved with a creatinine of 1.30 today.  Possibly secondary to obstruction from omental caking.   6.  Leukopenia: Resolved. 7.  Anemia: Chronic and unchanged.  Patient's hemoglobin has trended down slightly to 9.8. 8.  Thrombocytopenia: Resolved.  Will only give treatment on day 1 and 8 as above. 9.  Weight loss: Patient was given a referral to dietary.  Patient expressed understanding and was in agreement with this plan. She also understands that She can call clinic at any time with any questions, concerns, or complaints.    Lloyd Huger, MD   07/09/2020 12:53 PM

## 2020-07-07 ENCOUNTER — Other Ambulatory Visit: Payer: Medicare Other

## 2020-07-08 ENCOUNTER — Ambulatory Visit (INDEPENDENT_AMBULATORY_CARE_PROVIDER_SITE_OTHER): Payer: Medicare Other | Admitting: Family Medicine

## 2020-07-08 ENCOUNTER — Other Ambulatory Visit: Payer: Self-pay

## 2020-07-08 ENCOUNTER — Encounter: Payer: Self-pay | Admitting: Family Medicine

## 2020-07-08 VITALS — BP 120/76 | HR 72 | Temp 97.9°F | Resp 18 | Ht 64.0 in | Wt 153.1 lb

## 2020-07-08 DIAGNOSIS — E7800 Pure hypercholesterolemia, unspecified: Secondary | ICD-10-CM

## 2020-07-08 DIAGNOSIS — N183 Chronic kidney disease, stage 3 unspecified: Secondary | ICD-10-CM | POA: Diagnosis not present

## 2020-07-08 DIAGNOSIS — C259 Malignant neoplasm of pancreas, unspecified: Secondary | ICD-10-CM

## 2020-07-08 DIAGNOSIS — I10 Essential (primary) hypertension: Secondary | ICD-10-CM

## 2020-07-08 DIAGNOSIS — E1122 Type 2 diabetes mellitus with diabetic chronic kidney disease: Secondary | ICD-10-CM

## 2020-07-08 DIAGNOSIS — M316 Other giant cell arteritis: Secondary | ICD-10-CM

## 2020-07-08 DIAGNOSIS — N1832 Chronic kidney disease, stage 3b: Secondary | ICD-10-CM

## 2020-07-08 LAB — POCT GLYCOSYLATED HEMOGLOBIN (HGB A1C): Hemoglobin A1C: 6.7 % — AB (ref 4.0–5.6)

## 2020-07-08 NOTE — Progress Notes (Signed)
Name: Veronica Colon   MRN: [id number]    DOB: [birth year]   Date:07/08/2020       Progress Note  Chief Complaint  Patient presents with  . Diabetes     Subjective:   Veronica Colon is a 86 y.o. female, presents to clinic for DM f/up  Newer dx of pancreatic CA, loosing weight, cancer center monitors blood sugars, they have been higher, despite eating less, last A1C was about 6 months ago - well controlled, at that time she had stopped daily prednisone tx for temporal arteritis and blood sugars decreased, appetite decreased as well - she thought due to stopping steroids  HTN has been stable, no hypotenion, on spironolactone with history of hypertensive renal disease, cardiac murmur Blood pressure is well controlled today BP Readings from Last 3 Encounters:  07/08/20 120/76  07/01/20 125/63  07/01/20 (!) 133/57   Pt denies CP, SOB, exertional sx, LE edema, palpitation, Ha's, visual disturbances, lightheadedness, hypotension, syncope. She states that overall she is feeling tired and weak but not having any exertional symptoms   DM:   See above, not on medications Recent pertinent labs: Lab Results  Component Value Date   HGBA1C 6.3 (H) 01/07/2020   HGBA1C 6.2 (H) 03/12/2019   HGBA1C 6.9 (A) 08/01/2018   HGBA1C 6.9 08/01/2018   HGBA1C 6.9 (A) 08/01/2018   HGBA1C 6.9 08/01/2018   Point-of-care A1c done today was 6.7  Pancreatic adenocarcinoma, stage IV been on CT imaging follow-up from urology after she had hydronephrosis and pyelonephritis earlier this year -imaging around that time approximately in March did not show any masses, imaging about 3 months ago showed pancreatic mass, she had previously been established with oncologist and again for monoclonal gammopathy of unknown significance and she does have a history of breast cancer and colorectal cancer She is doing chemo, sees the oncologist twice a month Palliative care?  Check with [place] - on compazine for nausea - doing  chemo She still struggles with weight loss, her appetite When discussing goals of care and possible palliative care consult she wants to check with Dr. Orlie Dakin first  Nephrology - CKD stage 3b Renal function recent labs: Renal function has been fairly stable Lab Results  Component Value Date   GFRAA 36 (L) 04/11/2020   GFRAA 30 (L) 04/07/2020   GFRAA 38 (L) 03/24/2020    Lab Results  Component Value Date   CREATININE 1.63 (H) 07/01/2020   BUN 21 07/01/2020   NA 135 07/01/2020   K 3.8 07/01/2020   CL 100 07/01/2020   CO2 24 07/01/2020    Pt is here with her daughter today -she also did not have her hearing aids in so she is deferring to her daughter for answering a lot of questions   Current Outpatient Medications:  .  acetaminophen (TYLENOL) 325 MG tablet, Take 650 mg by mouth every 6 (six) hours as needed for mild pain., Disp: , Rfl:  .  ascorbic acid (VITAMIN C) 1000 MG tablet, Take by mouth., Disp: , Rfl:  .  aspirin 81 MG tablet, Take 81 mg by mouth daily., Disp: , Rfl:  .  fluticasone (FLONASE) 50 MCG/ACT nasal spray, Place 2 sprays into both nostrils daily., Disp: 48 g, Rfl: 3 .  lidocaine-prilocaine (EMLA) cream, Apply to port 1 hour prior to appointment. Cover with plastic wrap., Disp: 30 g, Rfl: 3 .  Multiple Vitamin (MULTIVITAMIN) tablet, Take 1 tablet by mouth daily., Disp: , Rfl:  .  OS-CAL CALCIUM + D3 500-200 MG-UNIT TABS, Take 1 tablet by mouth 2 (two) times daily with a meal., Disp: , Rfl: 11 .  prochlorperazine (COMPAZINE) 10 MG tablet, Take 1 tablet (10 mg total) by mouth every 6 (six) hours as needed (Nausea or vomiting)., Disp: 60 tablet, Rfl: 3 .  spironolactone (ALDACTONE) 50 MG tablet, TAKE 1 TABLET BY MOUTH EVERY DAY, Disp: 90 tablet, Rfl: 3  Patient Active Problem List   Diagnosis Date Noted  . Family history of breast cancer   . Family history of pancreatic cancer   . Goals of care, counseling/discussion 05/01/2020  . Pancreatic adenocarcinoma  ([place]) 05/01/2020  . Monoclonal gammopathy of unknown significance ([facility]US) 04/04/2020  . Lumbar spondylosis 10/16/2019  . Type 2 diabetes mellitus with obesity ([place]) 08/01/2018  . Hx of iron deficiency anemia 07/26/2018  . Pedal edema 03/01/2017  . Temporal arteritis ([place]) 10/28/2016  . Cervical pain 10/06/2016  . Anxiety and depression 04/20/2016  . Allergic rhinitis 02/26/2015  . H/O malignant neoplasm of breast 02/26/2015  . H/O malignant neoplasm of colon 02/26/2015  . GERD (gastroesophageal reflux disease) 02/26/2015  . Hypertensive CKD (chronic kidney disease) 02/26/2015  . CKD (chronic kidney disease), stage III ([place]) 02/26/2015    Past Surgical History:  Procedure Laterality Date  . ABDOMINAL HYSTERECTOMY    . BREAST SURGERY    . COLONOSCOPY WITH PROPOFOL N/A 06/04/2015   Procedure: COLONOSCOPY WITH PROPOFOL;  Surgeon: Manya Silvas, MD;  Location[facility] END[facility]COPY;  Service: Endoscopy;  Laterality: N/A;  . IR IMAGING GUIDED PORT INSERTION  05/09/2020  . MASTECTOMY Left 1990   BREAST CA  . TEMPORAL ARTERY BIOPSY / LIGATION  11/2016    Family History  Problem Relation Age of Onset  . Breast cancer Mother 11  . Lung cancer Father        lung  . Cancer Maternal Aunt        unk types  . Cancer Maternal Uncle        unk types  . Pancreatic cancer Cousin     Social History   Tobacco Use  . Smoking status: Never Smoker  . Smokeless tobacco: Never Used  Vaping Use  . Vaping Use: Never used  Substance Use Topics  . Alcohol use: No    Alcohol/week: 0.0 standard drinks  . Drug use: No     Allergies  Allergen Reactions  . Hydrochlorothiazide Other (See Comments)    Hypokalemia   . Lisinopril Other (See Comments) and Cough    Hands numb   . Metformin And Related     Fatigue, "heart was beating slower"    Health Maintenance  Topic Date Due  . FOOT EXAM  08/02/2019  . URINE MICROALBUMIN  03/11/2020  . HEMOGLOBIN A1C  07/08/2020  . COVID-19 Vaccine (3 -  Moderna risk 4-dose series) 01/08/2021 (Originally 01/30/2020)  . OPHTHALMOLOGY EXAM  05/07/2021  . TETANUS/TDAP  04/13/2025  . INFLUENZA VACCINE  Completed  . DEXA SCAN  Completed  . PNA vac Low Risk Adult  Completed  . MAMMOGRAM  Discontinued    Chart Review Today: I personally reviewed active problem list, medication list, allergies, family history, social history, health maintenance, notes from last encounter, lab results, imaging with the patient/caregiver today.   Review of Systems  10 Systems reviewed and are negative for acute change except as noted in the HPI.  Objective:   Vitals:   07/08/20 1104  BP: 120/76  Pulse: 72  Resp: 18  Temp: 97.9 F (36.6 C)  TempSrc: Oral  SpO2: 98%  Weight: 153 lb 1.6 oz (69.4 kg)  Height: 5\' 4"  (1.626 m)    Body mass index is 26.28 kg/m.  Physical Exam Vitals and nursing note reviewed.  Constitutional:      General: She is not in acute distress.    Appearance: She is not ill-appearing, toxic-appearing or diaphoretic.     Comments: Appears slightly more frail than last OV, but well, pleasant, and nontoxic appearing  HENT:     Head: Normocephalic and atraumatic.     Right Ear: External ear normal.     Left Ear: External ear normal.  Eyes:     General: No scleral icterus.       Right eye: No discharge.        Left eye: No discharge.     Conjunctiva/sclera: Conjunctivae normal.  Cardiovascular:     Rate and Rhythm: Normal rate and regular rhythm.     Pulses: Normal pulses.     Heart sounds: Murmur heard.  No friction rub. No gallop.   Pulmonary:     Effort: Pulmonary effort is normal.     Breath sounds: Normal breath sounds.  Abdominal:     General: Bowel sounds are normal. There is no distension.     Palpations: Abdomen is soft.     Tenderness: There is no abdominal tenderness. There is no guarding.  Musculoskeletal:     Right lower leg: No edema.     Left lower leg: No edema.  Skin:    General: Skin is warm and  dry.     Coloration: Skin is not jaundiced or pale.  Neurological:     Mental Status: She is alert. Mental status is at baseline.  Psychiatric:        Mood and Affect: Mood normal.        Behavior: Behavior normal.         Assessment & Plan:     ICD-10-CM   1. Type 2 diabetes mellitus with stage 3b chronic kidney disease, without long-term current use of insulin (HCC)  E11.22 POCT glycosylated hemoglobin (Hb A1C)   N18.32    hx of being well controlled, not on meds, blood sugars at oncology have been higher, recheck labs today  2. Essential hypertension  I10    stable, well controlled, BP at goal today - with weight loss and decreased appetite watch for hypotension - recently labs reviewed  3. Stage 3 chronic kidney disease, unspecified whether stage 3a or 3b CKD (HCC)  N18.30    renal function stable, monitoring with Dr. 11-01-2000   4. Temporal arteritis (HCC) Chronic M31.6    per rheumatology - completed tx and no longer on prednisone, asx  5. Pure hypercholesterolemia  E78.00    not on statin, lipids done in the past - with stage 4 pancreatic cancer dx that is new and new chemo - defer addressing this with labs or starting meds today   6. Pancreatic adenocarcinoma (HCC)  C25.9    On chemo, managing with Dr. Thedore Mins   Consult Orlie Dakin for any A1C goals with CA dx in mind, and ask about palliative care consult  Patient does have atherosclerotic disease and history of hyperlipidemia but given her current diagnoses and prognosis we did not discuss or address these diagnoses/and I did not attempt to start a statin medication the side effects of those medications with her current state of health and prognosis likely outweigh the  benefits  DM - continues to be well controlled with diet/lifestyle, A1C at goal  Health Maintenance  Topic Date Due  . Urine Protein Check  08/08/2020*  . COVID-19 Vaccine (3 - Moderna risk 4-dose series) 01/08/2021*  . Hemoglobin A1C  01/06/2021  .  Complete foot exam   01/15/2021  . Eye exam for diabetics  05/07/2021  . Tetanus Vaccine  04/13/2025  . Flu Shot  Completed  . DEXA scan (bone density measurement)  Completed  . Pneumonia vaccines  Completed  . Mammogram  Discontinued  *Topic was postponed. The date shown is not the original due date.  Urine microalbumin -managed by nephrology Covid vaccine booster unable to complete at this time due to chemotherapy  Return in about 3 months (around 10/06/2020) for Routine follow-up.   Delsa Grana, PA-C 07/08/20 11:25 AM

## 2020-07-08 NOTE — Patient Instructions (Addendum)
Please let me know if you need anything.  If you find you begin to struggle with depressed moods, we can try medications or get you in touch with someone to talk to about all you are experiencing.  Consider palliative care consultation - they help with quality of life and living with chronic conditions and diagnosis.   Type 2 Diabetes Mellitus, Self Care, Adult When you have type 2 diabetes (type 2 diabetes mellitus), you must make sure your blood sugar (glucose) stays in a healthy range. You can do this with:  Nutrition.  Exercise.  Lifestyle changes.  Medicines or insulin, if needed.  Support from your doctors and others. How to stay aware of blood sugar   Check your blood sugar level every day, as often as told.  Have your A1c (hemoglobin A1c) level checked two or more times a year. Have it checked more often if your doctor tells you to. Your doctor will set personal treatment goals for you. Generally, you should have these blood sugar levels:  Before meals (preprandial): 80-130 mg/dL (4.4-7.2 mmol/L).  After meals (postprandial): below 180 mg/dL (10 mmol/L).  A1c level: less than 7%. How to manage high and low blood sugar Signs of high blood sugar High blood sugar is called hyperglycemia. Know the signs of high blood sugar. Signs may include:  Feeling: ? Thirsty. ? Hungry. ? Very tired.  Needing to pee (urinate) more than usual.  Blurry vision. Signs of low blood sugar Low blood sugar is called hypoglycemia. This is when blood sugar is at or below 70 mg/dL (3.9 mmol/L). Signs may include:  Feeling: ? Hungry. ? Worried or nervous (anxious). ? Sweaty and clammy. ? Confused. ? Dizzy. ? Sleepy. ? Sick to your stomach (nauseous).  Having: ? A fast heartbeat. ? A headache. ? A change in your vision. ? Jerky movements that you cannot control (seizure). ? Tingling or no feeling (numbness) around your mouth, lips, or tongue.  Having trouble with: ? Moving  (coordination). ? Sleeping. ? Passing out (fainting). ? Getting upset easily (irritability). Treating low blood sugar To treat low blood sugar, eat or drink something sugary right away. If you can think clearly and swallow safely, follow the 15:15 rule:  Take 15 grams of a fast-acting carb (carbohydrate). Talk with your doctor about how much you should take.  Some fast-acting carbs are: ? Sugar tablets (glucose pills). Take 3-4 pills. ? 6-8 pieces of hard candy. ? 4-6 oz (120-150 mL) of fruit juice. ? 4-6 oz (120-150 mL) of regular (not diet) soda. ? 1 Tbsp (15 mL) honey or sugar.  Check your blood sugar 15 minutes after you take the carb.  If your blood sugar is still at or below 70 mg/dL (3.9 mmol/L), take 15 grams of a carb again.  If your blood sugar does not go above 70 mg/dL (3.9 mmol/L) after 3 tries, get help right away.  After your blood sugar goes back to normal, eat a meal or a snack within 1 hour. Treating very low blood sugar If your blood sugar is at or below 54 mg/dL (3 mmol/L), you have very low blood sugar (severe hypoglycemia). This is an emergency. Do not wait to see if the symptoms will go away. Get medical help right away. Call your local emergency services (911 in the [place] If you have very low blood sugar and you cannot eat or drink, you may need a glucagon shot (injection). A family member or friend should learn how to  check your blood sugar and how to give you a glucagon shot. Ask your doctor if you need to have a glucagon shot kit at home. Follow these instructions at home: Medicine  Take insulin and diabetes medicines as told.  If your doctor says you should take more or less insulin and medicines, do this exactly as told.  Do not run out of insulin or medicines. Having diabetes can raise your risk for other long-term conditions. These include heart disease and kidney disease. Your doctor may prescribe medicines to help you not have these  problems. Food   Make healthy food choices. These include: ? Chicken, fish, egg whites, and beans. ? Oats, whole wheat, bulgur, brown rice, quinoa, and millet. ? Fresh fruits and vegetables. ? Low-fat dairy products. ? Nuts, avocado, olive oil, and canola oil.  Meet with a food specialist (dietitian). He or she can help you make an eating plan that is right for you.  Follow instructions from your doctor about what you cannot eat or drink.  Drink enough fluid to keep your pee (urine) pale yellow.  Keep track of carbs that you eat. Do this by reading food labels and learning food serving sizes.  Follow your sick day plan when you cannot eat or drink normally. Make this plan with your doctor so it is ready to use. Activity  Exercise 3 or more times a week.  Do not go more than 2 days without exercising.  Talk with your doctor before you start a new exercise. Your doctor may need to tell you to change: ? How much insulin or medicines you take. ? How much food you eat. Lifestyle  Do not use any tobacco products. These include cigarettes, chewing tobacco, and e-cigarettes. If you need help quitting, ask your doctor.  Ask your doctor how much alcohol is safe for you.  Learn to deal with stress. If you need help with this, ask your doctor. Body care   Stay up to date with your shots (immunizations).  Have your eyes and feet checked by a doctor as often as told.  Check your skin and feet every day. Check for cuts, bruises, redness, blisters, or sores.  Brush your teeth and gums two times a day. Floss one or more times a day.  Go to the dentist one or more times every 6 months.  Stay at a healthy weight. General instructions  Take over-the-counter and prescription medicines only as told by your doctor.  Share your diabetes care plan with: ? Your work or school. ? People you live with.  Carry a card or wear jewelry that says you have diabetes.  Keep all follow-up  visits as told by your doctor. This is important. Questions to ask your doctor  Do I need to meet with a diabetes educator?  Where can I find a support group for people with diabetes? Where to find more information To learn more about diabetes, visit:  American Diabetes Association: www.diabetes.org  American Association of Diabetes Educators: www.diabeteseducator.org Summary  When you have type 2 diabetes, you must make sure your blood sugar (glucose) stays in a healthy range.  Check your blood sugar every day, as often as told.  Having diabetes can raise your risk for other conditions. Your doctor may prescribe medicines to help you not have these problems.  Keep all follow-up visits as told by your doctor. This is important. This information is not intended to replace advice given to you by your health care provider.  Make sure you discuss any questions you have with your health care provider. Document Revised: 12/19/2017 Document Reviewed: 08/01/2015 Elsevier Patient Education  2020 [employer].

## 2020-07-09 ENCOUNTER — Inpatient Hospital Stay: Payer: Medicare Other | Admitting: Oncology

## 2020-07-09 ENCOUNTER — Encounter: Payer: Self-pay | Admitting: Oncology

## 2020-07-09 ENCOUNTER — Inpatient Hospital Stay: Payer: Medicare Other

## 2020-07-09 VITALS — BP 129/62 | HR 66 | Temp 98.0°F | Resp 18 | Wt 154.2 lb

## 2020-07-09 DIAGNOSIS — C259 Malignant neoplasm of pancreas, unspecified: Secondary | ICD-10-CM

## 2020-07-09 LAB — COMPREHENSIVE METABOLIC PANEL
ALT: 56 U/L — ABNORMAL HIGH (ref 0–44)
AST: 47 U/L — ABNORMAL HIGH (ref 15–41)
Albumin: 3.7 g/dL (ref 3.5–5.0)
Alkaline Phosphatase: 87 U/L (ref 38–126)
Anion gap: 8 (ref 5–15)
BUN: 17 mg/dL (ref 8–23)
CO2: 25 mmol/L (ref 22–32)
Calcium: 9.6 mg/dL (ref 8.9–10.3)
Chloride: 103 mmol/L (ref 98–111)
Creatinine, Ser: 1.3 mg/dL — ABNORMAL HIGH (ref 0.44–1.00)
GFR, Estimated: 40 mL/min — ABNORMAL LOW (ref >60)
Glucose, Bld: 175 mg/dL — ABNORMAL HIGH (ref 70–99)
Potassium: 3.7 mmol/L (ref 3.5–5.1)
Sodium: 136 mmol/L (ref 135–145)
Total Bilirubin: 0.6 mg/dL (ref 0.3–1.2)
Total Protein: 6.9 g/dL (ref 6.5–8.1)

## 2020-07-09 LAB — CBC WITH DIFFERENTIAL/PLATELET
Abs Immature Granulocytes: 0.05 10*3/uL (ref 0.00–0.07)
Basophils Absolute: 0 10*3/uL (ref 0.0–0.1)
Basophils Relative: 1 %
Eosinophils Absolute: 0 10*3/uL (ref 0.0–0.5)
Eosinophils Relative: 1 %
HCT: 30.7 % — ABNORMAL LOW (ref 36.0–46.0)
Hemoglobin: 9.8 g/dL — ABNORMAL LOW (ref 12.0–15.0)
Immature Granulocytes: 1 %
Lymphocytes Relative: 28 %
Lymphs Abs: 1.4 10*3/uL (ref 0.7–4.0)
MCH: 28.6 pg (ref 26.0–34.0)
MCHC: 31.9 g/dL (ref 30.0–36.0)
MCV: 89.5 fL (ref 80.0–100.0)
Monocytes Absolute: 0.6 10*3/uL (ref 0.1–1.0)
Monocytes Relative: 11 %
Neutro Abs: 3 10*3/uL (ref 1.7–7.7)
Neutrophils Relative %: 58 %
Platelets: 176 10*3/uL (ref 150–400)
RBC: 3.43 MIL/uL — ABNORMAL LOW (ref 3.87–5.11)
RDW: 17.4 % — ABNORMAL HIGH (ref 11.5–15.5)
WBC: 5.1 10*3/uL (ref 4.0–10.5)
nRBC: 0.6 % — ABNORMAL HIGH (ref 0.0–0.2)

## 2020-07-09 MED ORDER — PROCHLORPERAZINE MALEATE 10 MG PO TABS
10.0000 mg | ORAL_TABLET | Freq: Once | ORAL | Status: AC
  Administered 2020-07-09: 10:00:00 10 mg via ORAL
  Filled 2020-07-09: qty 1

## 2020-07-09 MED ORDER — HEPARIN SOD (PORK) LOCK FLUSH 100 UNIT/ML IV SOLN
500.0000 [IU] | Freq: Once | INTRAVENOUS | Status: AC | PRN
  Administered 2020-07-09: 12:00:00 500 [IU]
  Filled 2020-07-09: qty 5

## 2020-07-09 MED ORDER — SODIUM CHLORIDE 0.9 % IV SOLN
1800.0000 mg | Freq: Once | INTRAVENOUS | Status: AC
  Administered 2020-07-09: 11:00:00 1800 mg via INTRAVENOUS
  Filled 2020-07-09: qty 26.3

## 2020-07-09 MED ORDER — SODIUM CHLORIDE 0.9 % IV SOLN
Freq: Once | INTRAVENOUS | Status: AC
  Filled 2020-07-09: qty 250

## 2020-07-09 MED ORDER — SODIUM CHLORIDE 0.9% FLUSH
10.0000 mL | INTRAVENOUS | Status: DC | PRN
  Administered 2020-07-09: 09:00:00 10 mL
  Filled 2020-07-09: qty 10

## 2020-07-09 NOTE — Progress Notes (Signed)
Patient tolerated infusion well, no concerns voiced. Patient discharged with daughter. Stable.

## 2020-07-15 ENCOUNTER — Ambulatory Visit: Payer: Medicare Other | Admitting: Oncology

## 2020-07-19 NOTE — Progress Notes (Signed)
[place]  [phone] [phone]  ID: Veronica Colon OB: 03-18-1932  MR#: [id number]  TDV#:[id number]  Patient Care Team: Delsa Grana, PA-C as PCP - General (Family Medicine) Marlowe Sax, MD as Referring Physician (Internal Medicine) Birder Robson, MD as Referring Physician (Ophthalmology) Lloyd Huger, MD as Consulting Physician (Hematology and Oncology)  CHIEF COMPLAINT: Stage IV pancreatic cancer.  INTERVAL HISTORY: Patient returns to clinic today for further evaluation and consideration of cycle 4, day 1 of single agent gemcitabine.  She continues to have a fair appetite and decreased p.o. intake, but otherwise feels well.  She is tolerating her treatment without significant side effects.  Her constipation is better controlled.  She has no neurologic complaints.  She denies any recent fevers or illnesses.  She denies any pain.  She has no chest pain, shortness of breath, cough, or hemoptysis.  She denies any nausea, vomiting, or diarrhea.  She has noted no changes in her bowel movements and denies any melena or hematochezia.  She has no urinary complaints.  Patient offers no further specific complaints today.  REVIEW OF SYSTEMS:   Review of Systems  Constitutional: Positive for weight loss. Negative for fever and malaise/fatigue.  Respiratory: Negative.  Negative for cough and hemoptysis.   Cardiovascular: Negative.  Negative for chest pain and leg swelling.  Gastrointestinal: Negative.  Negative for abdominal pain, blood in stool, constipation and melena.  Genitourinary: Negative.  Negative for dysuria.  Musculoskeletal: Negative.  Negative for back pain.  Skin: Negative.  Negative for rash.  Neurological: Negative.  Negative for dizziness, focal weakness, weakness and headaches.  Psychiatric/Behavioral: Negative.  The patient is not nervous/anxious.     As per HPI. Otherwise, a complete review of systems is  negative.  PAST MEDICAL HISTORY: Past Medical History:  Diagnosis Date  . Allergy   . Anxiety and depression   . Anxiety and depression   . Arthritis   . Breast cancer ([place]) 1990   LT MASTECTOMY  . Cancer ([place]) 1990   LT BREAST MASTECTOMY  . CKD (chronic kidney disease), stage III ([place])   . Diabetes mellitus without complication ([place])   . Family history of breast cancer   . Family history of pancreatic cancer   . GERD (gastroesophageal reflux disease)   . Hypertension   . Hypertensive CKD (chronic kidney disease)   . Personal history of chemotherapy   . Status post chemotherapy    breast cancer  . Temporal arteritis ([place]) 10/28/2016   Overview:  Right temporal arteritis  Biopsy proven 11/08/2016    PAST SURGICAL HISTORY: Past Surgical History:  Procedure Laterality Date  . ABDOMINAL HYSTERECTOMY    . BREAST SURGERY    . COLONOSCOPY WITH PROPOFOL N/A 06/04/2015   Procedure: COLONOSCOPY WITH PROPOFOL;  Surgeon: Manya Silvas, MD;  Location: [facility] ENDOSCOPY;  Service: Endoscopy;  Laterality: N/A;  . IR IMAGING GUIDED PORT INSERTION  05/09/2020  . MASTECTOMY Left 1990   BREAST CA  . TEMPORAL ARTERY BIOPSY / LIGATION  11/2016    FAMILY HISTORY: Family History  Problem Relation Age of Onset  . Breast cancer Mother 10  . Lung cancer Father        lung  . Cancer Maternal Aunt        unk types  . Cancer Maternal Uncle        unk types  . Pancreatic cancer Cousin     ADVANCED DIRECTIVES (Y/N):  N  HEALTH MAINTENANCE: Social History  Tobacco Use  . Smoking status: Never Smoker  . Smokeless tobacco: Never Used  Vaping Use  . Vaping Use: Never used  Substance Use Topics  . Alcohol use: No    Alcohol/week: 0.0 standard drinks  . Drug use: No     Colonoscopy:  PAP:  Bone density:  Lipid panel:  Allergies  Allergen Reactions  . Hydrochlorothiazide Other (See Comments)    Hypokalemia   . Lisinopril Other (See Comments) and Cough    Hands numb   .  Metformin And Related     Fatigue, "heart was beating slower"    Current Outpatient Medications  Medication Sig Dispense Refill  . acetaminophen (TYLENOL) 325 MG tablet Take 650 mg by mouth every 6 (six) hours as needed for mild pain.    Marland Kitchen ascorbic acid (VITAMIN C) 1000 MG tablet Take 1,000 mg by mouth 3 (three) times a week.    . fluticasone (FLONASE) 50 MCG/ACT nasal spray Place 2 sprays into both nostrils daily. 48 g 3  . lidocaine-prilocaine (EMLA) cream Apply to port 1 hour prior to appointment. Cover with plastic wrap. 30 g 3  . Multiple Vitamin (MULTIVITAMIN) tablet Take 1 tablet by mouth daily.    Dellia Nims CALCIUM + D3 500-200 MG-UNIT TABS Take 1 tablet by mouth 2 (two) times daily with a meal.  11  . spironolactone (ALDACTONE) 50 MG tablet TAKE 1 TABLET BY MOUTH EVERY DAY 90 tablet 3  . aspirin 81 MG tablet Take 81 mg by mouth 3 (three) times a week.    . prochlorperazine (COMPAZINE) 10 MG tablet Take 1 tablet (10 mg total) by mouth every 6 (six) hours as needed (Nausea or vomiting). 60 tablet 3   No current facility-administered medications for this visit.    OBJECTIVE: Vitals:   07/23/20 0959  BP: 127/64  Pulse: 67  Resp: 16  Temp: 98 F (36.7 C)     Body mass index is 26.16 kg/m.    ECOG FS:0 - Asymptomatic  General: Thin, no acute distress. Eyes: Pink conjunctiva, anicteric sclera. HEENT: Normocephalic, moist mucous membranes. Lungs: No audible wheezing or coughing. Heart: Regular rate and rhythm. Abdomen: Soft, nontender, no obvious distention. Musculoskeletal: No edema, cyanosis, or clubbing. Neuro: Alert, answering all questions appropriately. Cranial nerves grossly intact. Skin: No rashes or petechiae noted. Psych: Normal affect.  LAB RESULTS:  Lab Results  Component Value Date   NA 134 (L) 07/23/2020   K 3.8 07/23/2020   CL 101 07/23/2020   CO2 25 07/23/2020   GLUCOSE 232 (H) 07/23/2020   BUN 19 07/23/2020   CREATININE 1.65 (H) 07/23/2020    CALCIUM 9.3 07/23/2020   PROT 6.8 07/23/2020   ALBUMIN 3.6 07/23/2020   AST 37 07/23/2020   ALT 49 (H) 07/23/2020   ALKPHOS 112 07/23/2020   BILITOT 0.6 07/23/2020   GFRNONAA 30 (L) 07/23/2020   GFRAA 36 (L) 04/11/2020    Lab Results  Component Value Date   WBC 6.5 07/23/2020   NEUTROABS 4.9 07/23/2020   HGB 9.5 (L) 07/23/2020   HCT 28.9 (L) 07/23/2020   MCV 90.3 07/23/2020   PLT 261 07/23/2020     STUDIES: No results found.  ASSESSMENT: Stage IV pancreatic cancer.  PLAN:   1.  Stage IV pancreatic cancer: Omental biopsy confirmed results.  PET scans results from April 09, 2020 reviewed independently confirming stage of disease.  Patient CA 19-9 was also significantly elevated at 19,661, but now has trended down to 10,379.  Today's result is pending.  After lengthy discussion with the patient, she wishes to pursue palliative chemotherapy using single agent gemcitabine.  Initial plan was to treat on days 1, 8, and 15 on a 28-day cycle, but given thrombocytopenia patient will now receive treatment on days 1 and 8 every 21 days.  Abraxane is not an option given the national shortage, although this may be resolved in January 2022 and can add in later.  We also previously discussed the possibility of adding in capecitabine, but will reconsider in the future if patient wishes to pursue a more aggressive stance.  Patient has had port placement.  Proceed with cycle 4, day 1 of single agent gemcitabine today.  Return to clinic in 1 week for further evaluation and consideration of cycle 4, day 8.   2.  MGUS: Patient's most recent M spike is only 0.2.  This is likely clinically insignificant.  No further work-up is necessary. 3.  History of colon cancer: Patient had stage I colon cancer 8+ years ago 4.  History of breast cancer: 31 years ago.  Unclear stage or type of malignancy. 5.  Renal insufficiency: Creatinine is trended up to 1.65.  I have encouraged increased fluid intake.  Possibly  secondary to obstruction from omental caking.   6.  Leukopenia: Resolved. 7.  Anemia: Chronic and unchanged.  Patient's hemoglobin continues to slowly trend down at 9.5. 8.  Thrombocytopenia: Resolved.  Will only give treatment on day 1 and 8 as above. 9.  Weight loss: Patient was given a referral to dietary.  Patient states she has diarrhea every time she drinks boost, therefore have recommended nonlocked dose containing dietary supplements.  Patient expressed understanding and was in agreement with this plan. She also understands that She can call clinic at any time with any questions, concerns, or complaints.    Lloyd Huger, MD   07/23/2020 2:47 PM

## 2020-07-22 ENCOUNTER — Telehealth: Payer: Self-pay | Admitting: Oncology

## 2020-07-22 NOTE — Telephone Encounter (Signed)
[facility] is requesting clinical notes for DME fax# [phone] attn Kara.

## 2020-07-22 NOTE — Telephone Encounter (Signed)
Notes faxed as requested.

## 2020-07-23 ENCOUNTER — Other Ambulatory Visit: Payer: Self-pay

## 2020-07-23 ENCOUNTER — Encounter: Payer: Self-pay | Admitting: Oncology

## 2020-07-23 ENCOUNTER — Inpatient Hospital Stay: Payer: Medicare Other | Admitting: Oncology

## 2020-07-23 ENCOUNTER — Inpatient Hospital Stay: Payer: Medicare Other | Attending: Oncology

## 2020-07-23 ENCOUNTER — Inpatient Hospital Stay: Payer: Medicare Other

## 2020-07-23 VITALS — BP 127/64 | HR 67 | Temp 98.0°F | Resp 16 | Wt 152.4 lb

## 2020-07-23 DIAGNOSIS — Z853 Personal history of malignant neoplasm of breast: Secondary | ICD-10-CM | POA: Insufficient documentation

## 2020-07-23 DIAGNOSIS — R634 Abnormal weight loss: Secondary | ICD-10-CM | POA: Insufficient documentation

## 2020-07-23 DIAGNOSIS — Z9221 Personal history of antineoplastic chemotherapy: Secondary | ICD-10-CM | POA: Insufficient documentation

## 2020-07-23 DIAGNOSIS — C250 Malignant neoplasm of head of pancreas: Secondary | ICD-10-CM | POA: Insufficient documentation

## 2020-07-23 DIAGNOSIS — Z800 Family history of malignant neoplasm of digestive organs: Secondary | ICD-10-CM | POA: Insufficient documentation

## 2020-07-23 DIAGNOSIS — Z801 Family history of malignant neoplasm of trachea, bronchus and lung: Secondary | ICD-10-CM | POA: Insufficient documentation

## 2020-07-23 DIAGNOSIS — Z9012 Acquired absence of left breast and nipple: Secondary | ICD-10-CM | POA: Insufficient documentation

## 2020-07-23 DIAGNOSIS — M129 Arthropathy, unspecified: Secondary | ICD-10-CM | POA: Insufficient documentation

## 2020-07-23 DIAGNOSIS — R197 Diarrhea, unspecified: Secondary | ICD-10-CM | POA: Diagnosis not present

## 2020-07-23 DIAGNOSIS — Z5111 Encounter for antineoplastic chemotherapy: Secondary | ICD-10-CM | POA: Diagnosis not present

## 2020-07-23 DIAGNOSIS — Z85038 Personal history of other malignant neoplasm of large intestine: Secondary | ICD-10-CM | POA: Insufficient documentation

## 2020-07-23 DIAGNOSIS — Z7982 Long term (current) use of aspirin: Secondary | ICD-10-CM | POA: Insufficient documentation

## 2020-07-23 DIAGNOSIS — C259 Malignant neoplasm of pancreas, unspecified: Secondary | ICD-10-CM | POA: Diagnosis not present

## 2020-07-23 DIAGNOSIS — N183 Chronic kidney disease, stage 3 unspecified: Secondary | ICD-10-CM | POA: Diagnosis not present

## 2020-07-23 DIAGNOSIS — D472 Monoclonal gammopathy: Secondary | ICD-10-CM | POA: Diagnosis not present

## 2020-07-23 DIAGNOSIS — Z803 Family history of malignant neoplasm of breast: Secondary | ICD-10-CM | POA: Diagnosis not present

## 2020-07-23 DIAGNOSIS — I129 Hypertensive chronic kidney disease with stage 1 through stage 4 chronic kidney disease, or unspecified chronic kidney disease: Secondary | ICD-10-CM | POA: Diagnosis not present

## 2020-07-23 DIAGNOSIS — Z79899 Other long term (current) drug therapy: Secondary | ICD-10-CM | POA: Insufficient documentation

## 2020-07-23 DIAGNOSIS — D649 Anemia, unspecified: Secondary | ICD-10-CM | POA: Insufficient documentation

## 2020-07-23 DIAGNOSIS — E119 Type 2 diabetes mellitus without complications: Secondary | ICD-10-CM | POA: Diagnosis not present

## 2020-07-23 DIAGNOSIS — F418 Other specified anxiety disorders: Secondary | ICD-10-CM | POA: Diagnosis not present

## 2020-07-23 DIAGNOSIS — K219 Gastro-esophageal reflux disease without esophagitis: Secondary | ICD-10-CM | POA: Insufficient documentation

## 2020-07-23 LAB — CBC WITH DIFFERENTIAL/PLATELET
Abs Immature Granulocytes: 0.03 10*3/uL (ref 0.00–0.07)
Basophils Absolute: 0 10*3/uL (ref 0.0–0.1)
Basophils Relative: 1 %
Eosinophils Absolute: 0.3 10*3/uL (ref 0.0–0.5)
Eosinophils Relative: 4 %
HCT: 28.9 % — ABNORMAL LOW (ref 36.0–46.0)
Hemoglobin: 9.5 g/dL — ABNORMAL LOW (ref 12.0–15.0)
Immature Granulocytes: 1 %
Lymphocytes Relative: 8 %
Lymphs Abs: 0.5 10*3/uL — ABNORMAL LOW (ref 0.7–4.0)
MCH: 29.7 pg (ref 26.0–34.0)
MCHC: 32.9 g/dL (ref 30.0–36.0)
MCV: 90.3 fL (ref 80.0–100.0)
Monocytes Absolute: 0.7 10*3/uL (ref 0.1–1.0)
Monocytes Relative: 11 %
Neutro Abs: 4.9 10*3/uL (ref 1.7–7.7)
Neutrophils Relative %: 75 %
Platelets: 261 10*3/uL (ref 150–400)
RBC: 3.2 MIL/uL — ABNORMAL LOW (ref 3.87–5.11)
RDW: 18.7 % — ABNORMAL HIGH (ref 11.5–15.5)
WBC: 6.5 10*3/uL (ref 4.0–10.5)
nRBC: 0 % (ref 0.0–0.2)

## 2020-07-23 LAB — COMPREHENSIVE METABOLIC PANEL
ALT: 49 U/L — ABNORMAL HIGH (ref 0–44)
AST: 37 U/L (ref 15–41)
Albumin: 3.6 g/dL (ref 3.5–5.0)
Alkaline Phosphatase: 112 U/L (ref 38–126)
Anion gap: 8 (ref 5–15)
BUN: 19 mg/dL (ref 8–23)
CO2: 25 mmol/L (ref 22–32)
Calcium: 9.3 mg/dL (ref 8.9–10.3)
Chloride: 101 mmol/L (ref 98–111)
Creatinine, Ser: 1.65 mg/dL — ABNORMAL HIGH (ref 0.44–1.00)
GFR, Estimated: 30 mL/min — ABNORMAL LOW (ref >60)
Glucose, Bld: 232 mg/dL — ABNORMAL HIGH (ref 70–99)
Potassium: 3.8 mmol/L (ref 3.5–5.1)
Sodium: 134 mmol/L — ABNORMAL LOW (ref 135–145)
Total Bilirubin: 0.6 mg/dL (ref 0.3–1.2)
Total Protein: 6.8 g/dL (ref 6.5–8.1)

## 2020-07-23 MED ORDER — PROCHLORPERAZINE MALEATE 10 MG PO TABS
10.0000 mg | ORAL_TABLET | Freq: Once | ORAL | Status: AC
  Administered 2020-07-23: 10 mg via ORAL
  Filled 2020-07-23: qty 1

## 2020-07-23 MED ORDER — HEPARIN SOD (PORK) LOCK FLUSH 100 UNIT/ML IV SOLN
INTRAVENOUS | Status: AC
  Filled 2020-07-23: qty 5

## 2020-07-23 MED ORDER — PROCHLORPERAZINE MALEATE 10 MG PO TABS: 10.0000 mg | ORAL_TABLET | Freq: Four times a day (QID) | ORAL | 3 refills | Status: AC | PRN

## 2020-07-23 MED ORDER — SODIUM CHLORIDE 0.9 % IV SOLN
1800.0000 mg | Freq: Once | INTRAVENOUS | Status: AC
Start: 2020-07-23 — End: 2020-07-23
  Administered 2020-07-23: 1800 mg via INTRAVENOUS
  Filled 2020-07-23: qty 26.3

## 2020-07-23 MED ORDER — HEPARIN SOD (PORK) LOCK FLUSH 100 UNIT/ML IV SOLN
500.0000 [IU] | Freq: Once | INTRAVENOUS | Status: AC
  Administered 2020-07-23: 500 [IU] via INTRAVENOUS
  Filled 2020-07-23: qty 5

## 2020-07-23 MED ORDER — SODIUM CHLORIDE 0.9 % IV SOLN
Freq: Once | INTRAVENOUS | Status: AC
  Filled 2020-07-23: qty 250

## 2020-07-23 MED ORDER — SODIUM CHLORIDE 0.9% FLUSH
10.0000 mL | Freq: Once | INTRAVENOUS | Status: AC
  Administered 2020-07-23: 10 mL via INTRAVENOUS
  Filled 2020-07-23: qty 10

## 2020-07-24 LAB — CANCER ANTIGEN 19-9: CA 19-9: 10124 U/mL — ABNORMAL HIGH (ref 0–35)

## 2020-07-26 NOTE — Progress Notes (Signed)
[place]  [phone]) [phone] Fax:(336) [phone]  ID: Veronica Colon Jeff OB: 02-25-1932  MR#: [id number]  [id number]  Patient Care Team: Delsa Grana, PA-C as PCP - General (Family Medicine) Marlowe Sax, MD as Referring Physician (Internal Medicine) Birder Robson, MD as Referring Physician (Ophthalmology) Lloyd Huger, MD as Consulting Physician (Hematology and Oncology)  CHIEF COMPLAINT: Stage IV pancreatic cancer.  INTERVAL HISTORY: Patient returns to clinic today for further evaluation and consideration of cycle 4, day 8 of single agent gemcitabine.  Her appetite has improved, but she continues to have weight loss losing 1 pound this past week.  She otherwise feels well.  She is tolerating her treatments without significant side effects.  Her constipation is better controlled.  She has no neurologic complaints.  She denies any recent fevers or illnesses.  She denies any pain.  She has no chest pain, shortness of breath, cough, or hemoptysis.  She denies any nausea, vomiting, or diarrhea.  She has noted no changes in her bowel movements and denies any melena or hematochezia.  She has no urinary complaints.  Patient offers no further specific complaints today.  REVIEW OF SYSTEMS:   Review of Systems  Constitutional: Positive for malaise/fatigue and weight loss. Negative for fever.  Respiratory: Negative.  Negative for cough and hemoptysis.   Cardiovascular: Negative.  Negative for chest pain and leg swelling.  Gastrointestinal: Negative.  Negative for abdominal pain, blood in stool, constipation and melena.  Genitourinary: Negative.  Negative for dysuria.  Musculoskeletal: Negative.  Negative for back pain.  Skin: Negative.  Negative for rash.  Neurological: Positive for weakness. Negative for dizziness, focal weakness and headaches.  Psychiatric/Behavioral: Negative.  The patient is not nervous/anxious.     As per HPI. Otherwise, a  complete review of systems is negative.  PAST MEDICAL HISTORY: Past Medical History:  Diagnosis Date  . Allergy   . Anxiety and depression   . Anxiety and depression   . Arthritis   . Breast cancer ([place]) 1990   LT MASTECTOMY  . Cancer ([place]) 1990   LT BREAST MASTECTOMY  . CKD (chronic kidney disease), stage III ([place])   . Diabetes mellitus without complication ([place])   . Family history of breast cancer   . Family history of pancreatic cancer   . GERD (gastroesophageal reflux disease)   . Hypertension   . Hypertensive CKD (chronic kidney disease)   . Personal history of chemotherapy   . Status post chemotherapy    breast cancer  . Temporal arteritis ([place]) 10/28/2016   Overview:  Right temporal arteritis  Biopsy proven 11/08/2016    PAST SURGICAL HISTORY: Past Surgical History:  Procedure Laterality Date  . ABDOMINAL HYSTERECTOMY    . BREAST SURGERY    . COLONOSCOPY WITH PROPOFOL N/A 06/04/2015   Procedure: COLONOSCOPY WITH PROPOFOL;  Surgeon: Manya Silvas, MD;  Location: [facility] ENDOSCOPY;  Service: Endoscopy;  Laterality: N/A;  . IR IMAGING GUIDED PORT INSERTION  05/09/2020  . MASTECTOMY Left 1990   BREAST CA  . TEMPORAL ARTERY BIOPSY / LIGATION  11/2016    FAMILY HISTORY: Family History  Problem Relation Age of Onset  . Breast cancer Mother 33  . Lung cancer Father        lung  . Cancer Maternal Aunt        unk types  . Cancer Maternal Uncle        unk types  . Pancreatic cancer Cousin     ADVANCED DIRECTIVES (Y/N):  N  HEALTH MAINTENANCE: Social History   Tobacco Use  . Smoking status: Never Smoker  . Smokeless tobacco: Never Used  Vaping Use  . Vaping Use: Never used  Substance Use Topics  . Alcohol use: No    Alcohol/week: 0.0 standard drinks  . Drug use: No     Colonoscopy:  PAP:  Bone density:  Lipid panel:  Allergies  Allergen Reactions  . Hydrochlorothiazide Other (See Comments)    Hypokalemia   . Lisinopril Other (See Comments) and  Cough    Hands numb   . Metformin And Related     Fatigue, "heart was beating slower"    Current Outpatient Medications  Medication Sig Dispense Refill  . acetaminophen (TYLENOL) 325 MG tablet Take 650 mg by mouth every 6 (six) hours as needed for mild pain.    Marland Kitchen ascorbic acid (VITAMIN C) 1000 MG tablet Take 1,000 mg by mouth 3 (three) times a week.    Marland Kitchen aspirin 81 MG tablet Take 81 mg by mouth 3 (three) times a week.    . fluticasone (FLONASE) 50 MCG/ACT nasal spray Place 2 sprays into both nostrils daily. 48 g 3  . lidocaine-prilocaine (EMLA) cream Apply to port 1 hour prior to appointment. Cover with plastic wrap. 30 g 3  . Multiple Vitamin (MULTIVITAMIN) tablet Take 1 tablet by mouth daily.    Dellia Nims CALCIUM + D3 500-200 MG-UNIT TABS Take 1 tablet by mouth 2 (two) times daily with a meal.  11  . prochlorperazine (COMPAZINE) 10 MG tablet Take 1 tablet (10 mg total) by mouth every 6 (six) hours as needed (Nausea or vomiting). 60 tablet 3  . spironolactone (ALDACTONE) 50 MG tablet TAKE 1 TABLET BY MOUTH EVERY DAY 90 tablet 3   No current facility-administered medications for this visit.    OBJECTIVE: Vitals:   07/30/20 0938  BP: 136/74  Pulse: 80  Resp: 20  Temp: 97.8 F (36.6 C)  SpO2: 100%     Body mass index is 25.88 kg/m.    ECOG FS:0 - Asymptomatic  General: Thin, no acute distress. Eyes: Pink conjunctiva, anicteric sclera. HEENT: Normocephalic, moist mucous membranes. Lungs: No audible wheezing or coughing. Heart: Regular rate and rhythm. Abdomen: Soft, nontender, no obvious distention. Musculoskeletal: No edema, cyanosis, or clubbing. Neuro: Alert, answering all questions appropriately. Cranial nerves grossly intact. Skin: No rashes or petechiae noted. Psych: Normal affect.  LAB RESULTS:  Lab Results  Component Value Date   NA 134 (L) 07/23/2020   K 3.8 07/23/2020   CL 101 07/23/2020   CO2 25 07/23/2020   GLUCOSE 232 (H) 07/23/2020   BUN 19 07/23/2020    CREATININE 1.65 (H) 07/23/2020   CALCIUM 9.3 07/23/2020   PROT 6.8 07/23/2020   ALBUMIN 3.6 07/23/2020   AST 37 07/23/2020   ALT 49 (H) 07/23/2020   ALKPHOS 112 07/23/2020   BILITOT 0.6 07/23/2020   GFRNONAA 30 (L) 07/23/2020   GFRAA 36 (L) 04/11/2020    Lab Results  Component Value Date   WBC 4.8 07/30/2020   NEUTROABS 3.0 07/30/2020   HGB 9.6 (L) 07/30/2020   HCT 29.4 (L) 07/30/2020   MCV 90.5 07/30/2020   PLT 213 07/30/2020     STUDIES: No results found.  ASSESSMENT: Stage IV pancreatic cancer.  PLAN:   1.  Stage IV pancreatic cancer: Omental biopsy confirmed results.  PET scans results from April 09, 2020 reviewed independently confirming stage of disease.  Patient CA 19-9 was also significantly  elevated at 19,661, but now has trended down to 10,124.  After lengthy discussion with the patient, she wishes to pursue palliative chemotherapy using single agent gemcitabine.  Initial plan was to treat on days 1, 8, and 15 on a 28-day cycle, but given thrombocytopenia patient will now receive treatment on days 1 and 8 every 21 days.  Abraxane is not an option given the national shortage, although this may be resolved in January 2022 and can add in later.  We also previously discussed the possibility of adding in capecitabine, but will reconsider in the future if patient wishes to pursue a more aggressive stance.  Patient has had port placement.  Proceed with cycle 4, day 8 of single agent gemcitabine today.  Return to clinic in 2 weeks for further evaluation and consideration of cycle 5, day 1.  2.  MGUS: Patient's most recent M spike is only 0.2.  This is likely clinically insignificant.  No further work-up is necessary. 3.  History of colon cancer: Patient had stage I colon cancer 8+ years ago 4.  History of breast cancer: 31 years ago.  Unclear stage or type of malignancy. 5.  Renal insufficiency: Patient's most recent creatinine is 1.65.  Continue increased fluid intake.   Possibly secondary to obstruction from omental caking.   6.  Leukopenia: Resolved. 7.  Anemia: Chronic and unchanged.  Patient's hemoglobin is 9.6 today. 8.  Thrombocytopenia: Resolved.  Will only give treatment on day 1 and 8 as above. 9.  Weight loss: Patient was given a referral to dietary and has an appointment in early February.  Patient states she has diarrhea every time she drinks boost, therefore have recommended nonlocked dose containing dietary supplements.  Patient expressed understanding and was in agreement with this plan. She also understands that She can call clinic at any time with any questions, concerns, or complaints.    Lloyd Huger, MD   07/30/2020 10:14 AM

## 2020-07-30 ENCOUNTER — Inpatient Hospital Stay: Payer: Medicare Other | Admitting: Oncology

## 2020-07-30 ENCOUNTER — Encounter: Payer: Self-pay | Admitting: Oncology

## 2020-07-30 ENCOUNTER — Inpatient Hospital Stay: Payer: Medicare Other

## 2020-07-30 VITALS — BP 136/74 | HR 80 | Temp 97.8°F | Resp 20 | Wt 150.8 lb

## 2020-07-30 VITALS — BP 124/65 | HR 56 | Resp 18

## 2020-07-30 DIAGNOSIS — C259 Malignant neoplasm of pancreas, unspecified: Secondary | ICD-10-CM

## 2020-07-30 DIAGNOSIS — C250 Malignant neoplasm of head of pancreas: Secondary | ICD-10-CM | POA: Diagnosis not present

## 2020-07-30 LAB — CBC WITH DIFFERENTIAL/PLATELET
Abs Immature Granulocytes: 0.08 10*3/uL — ABNORMAL HIGH (ref 0.00–0.07)
Basophils Absolute: 0 10*3/uL (ref 0.0–0.1)
Basophils Relative: 1 %
Eosinophils Absolute: 0.1 10*3/uL (ref 0.0–0.5)
Eosinophils Relative: 3 %
HCT: 29.4 % — ABNORMAL LOW (ref 36.0–46.0)
Hemoglobin: 9.6 g/dL — ABNORMAL LOW (ref 12.0–15.0)
Immature Granulocytes: 2 %
Lymphocytes Relative: 19 %
Lymphs Abs: 0.9 10*3/uL (ref 0.7–4.0)
MCH: 29.5 pg (ref 26.0–34.0)
MCHC: 32.7 g/dL (ref 30.0–36.0)
MCV: 90.5 fL (ref 80.0–100.0)
Monocytes Absolute: 0.6 10*3/uL (ref 0.1–1.0)
Monocytes Relative: 13 %
Neutro Abs: 3 10*3/uL (ref 1.7–7.7)
Neutrophils Relative %: 62 %
Platelets: 213 10*3/uL (ref 150–400)
RBC: 3.25 MIL/uL — ABNORMAL LOW (ref 3.87–5.11)
RDW: 18.1 % — ABNORMAL HIGH (ref 11.5–15.5)
WBC: 4.8 10*3/uL (ref 4.0–10.5)
nRBC: 0.8 % — ABNORMAL HIGH (ref 0.0–0.2)

## 2020-07-30 LAB — COMPREHENSIVE METABOLIC PANEL
ALT: 40 U/L (ref 0–44)
AST: 43 U/L — ABNORMAL HIGH (ref 15–41)
Albumin: 3.6 g/dL (ref 3.5–5.0)
Alkaline Phosphatase: 110 U/L (ref 38–126)
Anion gap: 12 (ref 5–15)
BUN: 20 mg/dL (ref 8–23)
CO2: 22 mmol/L (ref 22–32)
Calcium: 9.8 mg/dL (ref 8.9–10.3)
Chloride: 101 mmol/L (ref 98–111)
Creatinine, Ser: 1.54 mg/dL — ABNORMAL HIGH (ref 0.44–1.00)
GFR, Estimated: 33 mL/min — ABNORMAL LOW (ref >60)
Glucose, Bld: 162 mg/dL — ABNORMAL HIGH (ref 70–99)
Potassium: 3.9 mmol/L (ref 3.5–5.1)
Sodium: 135 mmol/L (ref 135–145)
Total Bilirubin: 0.5 mg/dL (ref 0.3–1.2)
Total Protein: 7.1 g/dL (ref 6.5–8.1)

## 2020-07-30 MED ORDER — HEPARIN SOD (PORK) LOCK FLUSH 100 UNIT/ML IV SOLN
500.0000 [IU] | Freq: Once | INTRAVENOUS | Status: AC | PRN
  Administered 2020-07-30: 500 [IU]
  Filled 2020-07-30: qty 5

## 2020-07-30 MED ORDER — SODIUM CHLORIDE 0.9% FLUSH
10.0000 mL | Freq: Once | INTRAVENOUS | Status: AC
  Administered 2020-07-30: 10 mL via INTRAVENOUS
  Filled 2020-07-30: qty 10

## 2020-07-30 MED ORDER — SODIUM CHLORIDE 0.9 % IV SOLN
Freq: Once | INTRAVENOUS | Status: AC
  Filled 2020-07-30: qty 250

## 2020-07-30 MED ORDER — HEPARIN SOD (PORK) LOCK FLUSH 100 UNIT/ML IV SOLN
INTRAVENOUS | Status: AC
  Filled 2020-07-30: qty 5

## 2020-07-30 MED ORDER — GEMCITABINE HCL CHEMO INJECTION 1 GM/26.3ML
1800.0000 mg | Freq: Once | INTRAVENOUS | Status: AC
Start: 2020-07-30 — End: 2020-07-30
  Administered 2020-07-30: 1800 mg via INTRAVENOUS
  Filled 2020-07-30: qty 26.3

## 2020-07-30 MED ORDER — PROCHLORPERAZINE MALEATE 10 MG PO TABS
10.0000 mg | ORAL_TABLET | Freq: Once | ORAL | Status: AC
  Administered 2020-07-30: 10 mg via ORAL
  Filled 2020-07-30: qty 1

## 2020-07-30 NOTE — Progress Notes (Signed)
Patient tolerated infusion well. Patient and VSS. Discharged home  

## 2020-08-08 NOTE — Progress Notes (Signed)
[name]  [phone]) [phone] [phone]) [phone]  ID: Veronica Colon OB: 04/08/1932  MR#: [id number]  WVP#:[id number]  Patient Care Team: Delsa Grana, PA-C as PCP - General (Family Medicine) Marlowe Sax, MD as Referring Physician (Internal Medicine) Birder Robson, MD as Referring Physician (Ophthalmology) Lloyd Huger, MD as Consulting Physician (Hematology and Oncology)  CHIEF COMPLAINT: Stage IV pancreatic cancer.  INTERVAL HISTORY: Patient returns to clinic today for further evaluation and consideration of cycle 5, day 1 of single agent gemcitabine.  Over the past week she has noticed increasing weakness and fatigue and worsening appetite.  She also continues to lose weight.  She otherwise is tolerating her treatments without significant side effects.  Her constipation is better controlled.  She has no neurologic complaints.  She denies any recent fevers or illnesses.  She denies any pain.  She has no chest pain, shortness of breath, cough, or hemoptysis.  She denies any nausea, vomiting, or diarrhea.  She has noted no changes in her bowel movements and denies any melena or hematochezia.  She has no urinary complaints.  Patient offers no further specific complaints today.  REVIEW OF SYSTEMS:   Review of Systems  Constitutional: Positive for malaise/fatigue and weight loss. Negative for fever.  Respiratory: Negative.  Negative for cough and hemoptysis.   Cardiovascular: Negative.  Negative for chest pain and leg swelling.  Gastrointestinal: Negative.  Negative for abdominal pain, blood in stool, constipation and melena.  Genitourinary: Negative.  Negative for dysuria.  Musculoskeletal: Negative.  Negative for back pain.  Skin: Negative.  Negative for rash.  Neurological: Positive for weakness. Negative for dizziness, focal weakness and headaches.  Psychiatric/Behavioral: Negative.  The patient is not nervous/anxious.     As per HPI.  Otherwise, a complete review of systems is negative.  PAST MEDICAL HISTORY: Past Medical History:  Diagnosis Date  . Allergy   . Anxiety and depression   . Anxiety and depression   . Arthritis   . Breast cancer ([place]) 1990   LT MASTECTOMY  . Cancer ([place]) 1990   LT BREAST MASTECTOMY  . CKD (chronic kidney disease), stage III ([place])   . Diabetes mellitus without complication ([place])   . Family history of breast cancer   . Family history of pancreatic cancer   . GERD (gastroesophageal reflux disease)   . Hypertension   . Hypertensive CKD (chronic kidney disease)   . Personal history of chemotherapy   . Status post chemotherapy    breast cancer  . Temporal arteritis ([place]) 10/28/2016   Overview:  Right temporal arteritis  Biopsy proven 11/08/2016    PAST SURGICAL HISTORY: Past Surgical History:  Procedure Laterality Date  . ABDOMINAL HYSTERECTOMY    . BREAST SURGERY    . COLONOSCOPY WITH PROPOFOL N/A 06/04/2015   Procedure: COLONOSCOPY WITH PROPOFOL;  Surgeon: Manya Silvas, MD;  Location[facility] ENDOSCOPY;  Service: Endoscopy;  Laterality: N/A;  . IR IMAGING GUIDED PORT INSERTION  05/09/2020  . MASTECTOMY Left 1990   BREAST CA  . TEMPORAL ARTERY BIOPSY / LIGATION  11/2016    FAMILY HISTORY: Family History  Problem Relation Age of Onset  . Breast cancer Mother 47  . Lung cancer Father        lung  . Cancer Maternal Aunt        unk types  . Cancer Maternal Uncle        unk types  . Pancreatic cancer Cousin     ADVANCED DIRECTIVES (Y/N):  N  HEALTH MAINTENANCE: Social History   Tobacco Use  . Smoking status: Never Smoker  . Smokeless tobacco: Never Used  Vaping Use  . Vaping Use: Never used  Substance Use Topics  . Alcohol use: No    Alcohol/week: 0.0 standard drinks  . Drug use: No     Colonoscopy:  PAP:  Bone density:  Lipid panel:  Allergies  Allergen Reactions  . Hydrochlorothiazide Other (See Comments)    Hypokalemia   . Lisinopril Other (See  Comments) and Cough    Hands numb   . Metformin And Related     Fatigue, "heart was beating slower"    Current Outpatient Medications  Medication Sig Dispense Refill  . acetaminophen (TYLENOL) 325 MG tablet Take 650 mg by mouth every 6 (six) hours as needed for mild pain.    Marland Kitchen ascorbic acid (VITAMIN C) 1000 MG tablet Take 1,000 mg by mouth 3 (three) times a week.    Marland Kitchen aspirin 81 MG tablet Take 81 mg by mouth 3 (three) times a week.    . fluticasone (FLONASE) 50 MCG/ACT nasal spray Place 2 sprays into both nostrils daily. 48 g 3  . lidocaine-prilocaine (EMLA) cream Apply to port 1 hour prior to appointment. Cover with plastic wrap. 30 g 3  . Multiple Vitamin (MULTIVITAMIN) tablet Take 1 tablet by mouth daily.    Dellia Nims CALCIUM + D3 500-200 MG-UNIT TABS Take 1 tablet by mouth 2 (two) times daily with a meal.  11  . prochlorperazine (COMPAZINE) 10 MG tablet Take 1 tablet (10 mg total) by mouth every 6 (six) hours as needed (Nausea or vomiting). 60 tablet 3  . spironolactone (ALDACTONE) 50 MG tablet TAKE 1 TABLET BY MOUTH EVERY DAY 90 tablet 3   No current facility-administered medications for this visit.    OBJECTIVE: Vitals:   08/13/20 1010  BP: (!) 138/56  Pulse: 71  Temp: 98.1 F (36.7 C)  SpO2: 99%     Body mass index is 24.79 kg/m.    ECOG FS:1 - Symptomatic but completely ambulatory  General: Thin, no acute distress. Eyes: Pink conjunctiva, anicteric sclera. HEENT: Normocephalic, moist mucous membranes. Lungs: No audible wheezing or coughing. Heart: Regular rate and rhythm. Abdomen: Soft, nontender, no obvious distention. Musculoskeletal: No edema, cyanosis, or clubbing. Neuro: Alert, answering all questions appropriately. Cranial nerves grossly intact. Skin: No rashes or petechiae noted. Psych: Normal affect.  LAB RESULTS:  Lab Results  Component Value Date   NA 133 (L) 08/13/2020   K 3.9 08/13/2020   CL 100 08/13/2020   CO2 21 (L) 08/13/2020   GLUCOSE 219  (H) 08/13/2020   BUN 21 08/13/2020   CREATININE 1.52 (H) 08/13/2020   CALCIUM 9.8 08/13/2020   PROT 6.9 08/13/2020   ALBUMIN 3.4 (L) 08/13/2020   AST 37 08/13/2020   ALT 26 08/13/2020   ALKPHOS 106 08/13/2020   BILITOT 0.7 08/13/2020   GFRNONAA 33 (L) 08/13/2020   GFRAA 36 (L) 04/11/2020    Lab Results  Component Value Date   WBC 6.9 08/13/2020   NEUTROABS 4.9 08/13/2020   HGB 9.5 (L) 08/13/2020   HCT 28.4 (L) 08/13/2020   MCV 91.0 08/13/2020   PLT 280 08/13/2020     STUDIES: No results found.  ASSESSMENT: Stage IV pancreatic cancer.  PLAN:   1.  Stage IV pancreatic cancer: Omental biopsy confirmed results.  PET scans results from April 09, 2020 reviewed independently confirming stage of disease.  Patient CA 19-9 was  also significantly elevated at 19,661, but now has trended down to 10,124.  After lengthy discussion with the patient, she wishes to pursue palliative chemotherapy using single agent gemcitabine.  Initial plan was to treat on days 1, 8, and 15 on a 28-day cycle, but given thrombocytopenia patient will now receive treatment on days 1 and 8 every 21 days.  Abraxane is not an option given the national shortage, although this may be resolved in January 2022 and can add in later.  We also previously discussed the possibility of adding in capecitabine, but will reconsider in the future if patient wishes to pursue a more aggressive stance.  Patient has had port placement.  Given patient's decreased performance status, will delay treatment today.  Patient will instead receive IV fluids and Decadron.  Return to clinic in 1 week for further evaluation and reconsideration of cycle 5, day 1.    2.  MGUS: Patient's most recent M spike is only 0.2.  This is likely clinically insignificant.  No further work-up is necessary. 3.  History of colon cancer: Patient had stage I colon cancer 8+ years ago 4.  History of breast cancer: 31 years ago.  Unclear stage or type of  malignancy. 5.  Renal insufficiency: Mildly improved with creatinine 1.52 today.  IV fluids as above.  Possibly secondary to obstruction from omental caking.   6.  Leukopenia: Resolved. 7.  Anemia: Chronic and unchanged.  Patient's hemoglobin is 9.5 today. 8.  Thrombocytopenia: Resolved.  Will only give treatment on day 1 and 8 as above. 9.  Weight loss: Patient's weight continues to decline.  She has an appointment with dietary later this week.    Patient expressed understanding and was in agreement with this plan. She also understands that She can call clinic at any time with any questions, concerns, or complaints.    Lloyd Huger, MD   08/14/2020 6:33 AM

## 2020-08-13 ENCOUNTER — Inpatient Hospital Stay: Payer: Medicare Other | Admitting: Oncology

## 2020-08-13 ENCOUNTER — Telehealth: Payer: Self-pay | Admitting: Oncology

## 2020-08-13 ENCOUNTER — Inpatient Hospital Stay: Payer: Medicare Other | Attending: Oncology

## 2020-08-13 ENCOUNTER — Inpatient Hospital Stay: Payer: Medicare Other

## 2020-08-13 VITALS — BP 138/56 | HR 71 | Temp 98.1°F | Wt 144.4 lb

## 2020-08-13 DIAGNOSIS — Z800 Family history of malignant neoplasm of digestive organs: Secondary | ICD-10-CM | POA: Diagnosis not present

## 2020-08-13 DIAGNOSIS — K219 Gastro-esophageal reflux disease without esophagitis: Secondary | ICD-10-CM | POA: Diagnosis not present

## 2020-08-13 DIAGNOSIS — R634 Abnormal weight loss: Secondary | ICD-10-CM | POA: Insufficient documentation

## 2020-08-13 DIAGNOSIS — Z7982 Long term (current) use of aspirin: Secondary | ICD-10-CM | POA: Insufficient documentation

## 2020-08-13 DIAGNOSIS — Z803 Family history of malignant neoplasm of breast: Secondary | ICD-10-CM | POA: Diagnosis not present

## 2020-08-13 DIAGNOSIS — Z79899 Other long term (current) drug therapy: Secondary | ICD-10-CM | POA: Diagnosis not present

## 2020-08-13 DIAGNOSIS — Z801 Family history of malignant neoplasm of trachea, bronchus and lung: Secondary | ICD-10-CM | POA: Diagnosis not present

## 2020-08-13 DIAGNOSIS — Z5111 Encounter for antineoplastic chemotherapy: Secondary | ICD-10-CM | POA: Diagnosis not present

## 2020-08-13 DIAGNOSIS — C259 Malignant neoplasm of pancreas, unspecified: Secondary | ICD-10-CM | POA: Insufficient documentation

## 2020-08-13 DIAGNOSIS — D472 Monoclonal gammopathy: Secondary | ICD-10-CM | POA: Diagnosis not present

## 2020-08-13 DIAGNOSIS — E119 Type 2 diabetes mellitus without complications: Secondary | ICD-10-CM | POA: Insufficient documentation

## 2020-08-13 DIAGNOSIS — Z9012 Acquired absence of left breast and nipple: Secondary | ICD-10-CM | POA: Diagnosis not present

## 2020-08-13 DIAGNOSIS — F418 Other specified anxiety disorders: Secondary | ICD-10-CM | POA: Diagnosis not present

## 2020-08-13 DIAGNOSIS — N183 Chronic kidney disease, stage 3 unspecified: Secondary | ICD-10-CM | POA: Diagnosis not present

## 2020-08-13 DIAGNOSIS — N2889 Other specified disorders of kidney and ureter: Secondary | ICD-10-CM | POA: Insufficient documentation

## 2020-08-13 LAB — COMPREHENSIVE METABOLIC PANEL
ALT: 26 U/L (ref 0–44)
AST: 37 U/L (ref 15–41)
Albumin: 3.4 g/dL — ABNORMAL LOW (ref 3.5–5.0)
Alkaline Phosphatase: 106 U/L (ref 38–126)
Anion gap: 12 (ref 5–15)
BUN: 21 mg/dL (ref 8–23)
CO2: 21 mmol/L — ABNORMAL LOW (ref 22–32)
Calcium: 9.8 mg/dL (ref 8.9–10.3)
Chloride: 100 mmol/L (ref 98–111)
Creatinine, Ser: 1.52 mg/dL — ABNORMAL HIGH (ref 0.44–1.00)
GFR, Estimated: 33 mL/min — ABNORMAL LOW (ref >60)
Glucose, Bld: 219 mg/dL — ABNORMAL HIGH (ref 70–99)
Potassium: 3.9 mmol/L (ref 3.5–5.1)
Sodium: 133 mmol/L — ABNORMAL LOW (ref 135–145)
Total Bilirubin: 0.7 mg/dL (ref 0.3–1.2)
Total Protein: 6.9 g/dL (ref 6.5–8.1)

## 2020-08-13 LAB — CBC WITH DIFFERENTIAL/PLATELET
Abs Immature Granulocytes: 0.05 10*3/uL (ref 0.00–0.07)
Basophils Absolute: 0 10*3/uL (ref 0.0–0.1)
Basophils Relative: 0 %
Eosinophils Absolute: 0.3 10*3/uL (ref 0.0–0.5)
Eosinophils Relative: 4 %
HCT: 28.4 % — ABNORMAL LOW (ref 36.0–46.0)
Hemoglobin: 9.5 g/dL — ABNORMAL LOW (ref 12.0–15.0)
Immature Granulocytes: 1 %
Lymphocytes Relative: 12 %
Lymphs Abs: 0.8 10*3/uL (ref 0.7–4.0)
MCH: 30.4 pg (ref 26.0–34.0)
MCHC: 33.5 g/dL (ref 30.0–36.0)
MCV: 91 fL (ref 80.0–100.0)
Monocytes Absolute: 0.9 10*3/uL (ref 0.1–1.0)
Monocytes Relative: 13 %
Neutro Abs: 4.9 10*3/uL (ref 1.7–7.7)
Neutrophils Relative %: 70 %
Platelets: 280 10*3/uL (ref 150–400)
RBC: 3.12 MIL/uL — ABNORMAL LOW (ref 3.87–5.11)
RDW: 19.1 % — ABNORMAL HIGH (ref 11.5–15.5)
WBC: 6.9 10*3/uL (ref 4.0–10.5)
nRBC: 0 % (ref 0.0–0.2)

## 2020-08-13 MED ORDER — HEPARIN SOD (PORK) LOCK FLUSH 100 UNIT/ML IV SOLN
500.0000 [IU] | Freq: Once | INTRAVENOUS | Status: AC
  Administered 2020-08-13: 500 [IU] via INTRAVENOUS
  Filled 2020-08-13: qty 5

## 2020-08-13 MED ORDER — HEPARIN SOD (PORK) LOCK FLUSH 100 UNIT/ML IV SOLN
INTRAVENOUS | Status: AC
  Filled 2020-08-13: qty 5

## 2020-08-13 MED ORDER — SODIUM CHLORIDE 0.9% FLUSH
10.0000 mL | Freq: Once | INTRAVENOUS | Status: AC
  Administered 2020-08-13: 10 mL via INTRAVENOUS
  Filled 2020-08-13: qty 10

## 2020-08-13 MED ORDER — SODIUM CHLORIDE 0.9 % IV SOLN
10.0000 mg | Freq: Once | INTRAVENOUS | Status: AC
  Administered 2020-08-13: 10 mg via INTRAVENOUS
  Filled 2020-08-13: qty 10

## 2020-08-13 MED ORDER — SODIUM CHLORIDE 0.9 % IV SOLN
Freq: Once | INTRAVENOUS | Status: AC
  Filled 2020-08-13: qty 250

## 2020-08-13 NOTE — Telephone Encounter (Addendum)
Pt's daughter called and stated that pt is not feeling well from today's treatment and would like to see if tomorrow's appt with [name] can be virtual.

## 2020-08-13 NOTE — Progress Notes (Signed)
Per RN Tillie Rung, Dr Grayland Ormond states pt not to receive scheduled tx in clinic today. Pt to receive 1L of NS and IV decadron. Pt tolerated well. No complaints at d/c.

## 2020-08-13 NOTE — Telephone Encounter (Signed)
Yes.  I can conduct a phone visit with patient and/or daughter.

## 2020-08-13 NOTE — Telephone Encounter (Signed)
Called daughter to confirm nutrition telephone visit 08/14/20-confirmed with [name].

## 2020-08-14 ENCOUNTER — Inpatient Hospital Stay: Payer: Medicare Other

## 2020-08-14 NOTE — Progress Notes (Signed)
Nutrition Assessment   Reason for Assessment: Weight loss, poor appetite, lactose free shakes   ASSESSMENT:  87 year old female with stage IV pancreatic cancer.  Past medical history of DM, CKD, GERD, breast cancer, colon cancer. Patient receiving gemcitabine  Spoke with daughter and patient via phone. Daughter reports poor appetite, foods tasting too sweet and foods that she used to eat she does not like or want anymore (especially meats)  Snacks more now vs eating 3 large meals per day.  Likes eggs or oatmeal mixed with lactaid milk or cheese toast or toast with peanut butter for breakfast. Snacks on hard boiled eggs, cheese crackers, graham crackers.  Reports that boost/ensure shakes and boost soothe clear shakes cause diarrhea or loose stool.  Has had issues with constipation and mag citrate or shake will help with that.    Medications: MVI, compazine   Labs: Na 133, glucose 219, creatinine 1.52  Anthropometrics:   Height: 64 inches Weight: 144 lb 6.4 on 2/2 Sept, 2021 177 lb   BMI: 24  19% weight loss in the last 4 months, signficant   Estimated Energy Needs  Kcals: 1625-1950 Protein: 81-98 g Fluid: 1.6 L   NUTRITION DIAGNOSIS: Inadequate oral intake related to cancer and cancer related treatment as evidenced by 19% weight loss and poor appetite   INTERVENTION:  Encouraged small frequent meals Discussed ways to add calories and protein to current eating pattern Discussed things to try to help with taste change (too sweet). Handout provided Samples provided of [employer] 1.4 shake and orgain vegan shake, dairy free alternatives. Recipe booklet provided as well for patient to try.  Contact information provided   MONITORING, EVALUATION, GOAL: weight trends, intake   Next Visit: Feb 24 phone call  Cloria Ciresi B. Zenia Resides, [place], [place] Registered Dietitian [phone] (mobile)

## 2020-08-18 NOTE — Progress Notes (Signed)
[facility]  [phone] Fax:(336) [phone]  ID: Veronica Colon OB: [birth year]  MR#: [id number]  AOZ#:[id number]  Patient Care Team: Danelle Berry, PA-C as PCP - General (Family Medicine) Dayna Barker, MD as Referring Physician (Internal Medicine) Galen Manila, MD as Referring Physician (Ophthalmology) Jeralyn Ruths, MD as Consulting Physician (Hematology and Oncology)  CHIEF COMPLAINT: Stage IV pancreatic cancer.  INTERVAL HISTORY: Patient returns to clinic today for further evaluation and reconsideration of cycle 5, day 1 of single agent gemcitabine.  Her weakness and fatigue have improved.  She also has an improved appetite.  Her constipation is better controlled.  She has no neurologic complaints.  She denies any recent fevers or illnesses.  She denies any pain.  She has no chest pain, shortness of breath, cough, or hemoptysis.  She denies any nausea, vomiting, or diarrhea.  She has noted no changes in her bowel movements and denies any melena or hematochezia.  She has no urinary complaints.  Patient offers no further specific complaints today.  REVIEW OF SYSTEMS:   Review of Systems  Constitutional: Positive for malaise/fatigue. Negative for fever and weight loss.  Respiratory: Negative.  Negative for cough and hemoptysis.   Cardiovascular: Negative.  Negative for chest pain and leg swelling.  Gastrointestinal: Negative.  Negative for abdominal pain, blood in stool, constipation and melena.  Genitourinary: Negative.  Negative for dysuria.  Musculoskeletal: Negative.  Negative for back pain.  Skin: Negative.  Negative for rash.  Neurological: Positive for weakness. Negative for dizziness, focal weakness and headaches.  Psychiatric/Behavioral: Negative.  The patient is not nervous/anxious.     As per HPI. Otherwise, a complete review of systems is negative.  PAST MEDICAL HISTORY: Past Medical History:  Diagnosis Date  . Allergy    . Anxiety and depression   . Anxiety and depression   . Arthritis   . Breast cancer ([facility]) 1990   LT MASTECTOMY  . Cancer ([facility]) 1990   LT BREAST MASTECTOMY  . CKD (chronic kidney disease), stage III ([facility])   . Diabetes mellitus without complication ([facility])   . Family history of breast cancer   . Family history of pancreatic cancer   . GERD (gastroesophageal reflux disease)   . Hypertension   . Hypertensive CKD (chronic kidney disease)   . Personal history of chemotherapy   . Status post chemotherapy    breast cancer  . Temporal arteritis ([facility]) 10/28/2016   Overview:  Right temporal arteritis  Biopsy proven 11/08/2016    PAST SURGICAL HISTORY: Past Surgical History:  Procedure Laterality Date  . ABDOMINAL HYSTERECTOMY    . BREAST SURGERY    . COLONOSCOPY WITH PROPOFOL N/A 06/04/2015   Procedure: COLONOSCOPY WITH PROPOFOL;  Surgeon: Scot Jun, MD;  Location: [facility] ENDOSCOPY;  Service: Endoscopy;  Laterality: N/A;  . IR IMAGING GUIDED PORT INSERTION  05/09/2020  . MASTECTOMY Left 1990   BREAST CA  . TEMPORAL ARTERY BIOPSY / LIGATION  11/2016    FAMILY HISTORY: Family History  Problem Relation Age of Onset  . Breast cancer Mother 59  . Lung cancer Father        lung  . Cancer Maternal Aunt        unk types  . Cancer Maternal Uncle        unk types  . Pancreatic cancer Cousin     ADVANCED DIRECTIVES (Y/N):  N  HEALTH MAINTENANCE: Social History   Tobacco Use  . Smoking status: Never Smoker  .  Smokeless tobacco: Never Used  Vaping Use  . Vaping Use: Never used  Substance Use Topics  . Alcohol use: No    Alcohol/week: 0.0 standard drinks  . Drug use: No     Colonoscopy:  PAP:  Bone density:  Lipid panel:  Allergies  Allergen Reactions  . Hydrochlorothiazide Other (See Comments)    Hypokalemia   . Lisinopril Other (See Comments) and Cough    Hands numb   . Metformin And Related     Fatigue, "heart was beating slower"    Current Outpatient  Medications  Medication Sig Dispense Refill  . acetaminophen (TYLENOL) 325 MG tablet Take 650 mg by mouth every 6 (six) hours as needed for mild pain.    Marland Kitchen ascorbic acid (VITAMIN C) 1000 MG tablet Take 1,000 mg by mouth 3 (three) times a week.    Marland Kitchen aspirin 81 MG tablet Take 81 mg by mouth 3 (three) times a week.    . fluticasone (FLONASE) 50 MCG/ACT nasal spray Place 2 sprays into both nostrils daily. 48 g 3  . lidocaine-prilocaine (EMLA) cream Apply to port 1 hour prior to appointment. Cover with plastic wrap. 30 g 3  . Multiple Vitamin (MULTIVITAMIN) tablet Take 1 tablet by mouth daily.    Dellia Nims CALCIUM + D3 500-200 MG-UNIT TABS Take 1 tablet by mouth 2 (two) times daily with a meal.  11  . prochlorperazine (COMPAZINE) 10 MG tablet Take 1 tablet (10 mg total) by mouth every 6 (six) hours as needed (Nausea or vomiting). 60 tablet 3  . spironolactone (ALDACTONE) 50 MG tablet TAKE 1 TABLET BY MOUTH EVERY DAY 90 tablet 3   No current facility-administered medications for this visit.    OBJECTIVE: Vitals:   08/20/20 0916  BP: 130/72  Pulse: 68  Temp: 98.1 F (36.7 C)  SpO2: 99%     Body mass index is 24.72 kg/m.    ECOG FS:1 - Symptomatic but completely ambulatory  General: Thin, no acute distress.  Sitting in a wheelchair. Eyes: Pink conjunctiva, anicteric sclera. HEENT: Normocephalic, moist mucous membranes. Lungs: No audible wheezing or coughing. Heart: Regular rate and rhythm. Abdomen: Soft, nontender, no obvious distention. Musculoskeletal: No edema, cyanosis, or clubbing. Neuro: Alert, answering all questions appropriately. Cranial nerves grossly intact. Skin: No rashes or petechiae noted. Psych: Normal affect.  LAB RESULTS:  Lab Results  Component Value Date   NA 138 08/20/2020   K 3.7 08/20/2020   CL 103 08/20/2020   CO2 23 08/20/2020   GLUCOSE 159 (H) 08/20/2020   BUN 20 08/20/2020   CREATININE 1.44 (H) 08/20/2020   CALCIUM 9.7 08/20/2020   PROT 6.5  08/20/2020   ALBUMIN 3.2 (L) 08/20/2020   AST 28 08/20/2020   ALT 19 08/20/2020   ALKPHOS 89 08/20/2020   BILITOT 0.8 08/20/2020   GFRNONAA 35 (L) 08/20/2020   GFRAA 36 (L) 04/11/2020    Lab Results  Component Value Date   WBC 7.4 08/20/2020   NEUTROABS 5.1 08/20/2020   HGB 9.6 (L) 08/20/2020   HCT 29.4 (L) 08/20/2020   MCV 90.7 08/20/2020   PLT 285 08/20/2020     STUDIES: No results found.  ASSESSMENT: Stage IV pancreatic cancer.  PLAN:   1.  Stage IV pancreatic cancer: Omental biopsy confirmed results.  PET scans results from April 09, 2020 reviewed independently confirming stage of disease.  Patient CA 19-9 was also significantly elevated at 19,661 and trended down to 10,124.  Patient had a delay in  treatment and her CA 19-9 has trended up to 13,932.  Patient wishes to continue single agent gemcitabine.  Initial plan was to treat on days 1, 8, and 15 on a 28-day cycle, but given thrombocytopenia patient will now receive treatment on days 1 and 8 every 21 days.  Abraxane is not an option given the national shortage, but can consider to add in later.  We also previously discussed the possibility of adding in capecitabine, but will reconsider in the future if patient wishes to pursue a more aggressive stance.  Patient has had port placement.  Proceed with cycle 5, day 1 of treatment today.  Return to clinic in 1 week for further evaluation and consideration of cycle 5, day 8.  2.  MGUS: Patient's most recent M spike is only 0.2.  This is likely clinically insignificant.  No further work-up is necessary. 3.  History of colon cancer: Patient had stage I colon cancer 8+ years ago 4.  History of breast cancer: 31 years ago.  Unclear stage or type of malignancy. 5.  Renal insufficiency: Improved to 1.44.  Return to clinic on Friday for IV fluids.  Possibly secondary to obstruction from omental caking.   6.  Leukopenia: Resolved. 7.  Anemia: Chronic and unchanged.  Patient's  hemoglobin is 9.6 today. 8.  Thrombocytopenia: Resolved.  Will only give treatment on day 1 and 8 as above. 9.  Weight loss: Patient's weight has stabilized.  Appreciate dietary input.  Patient expressed understanding and was in agreement with this plan. She also understands that She can call clinic at any time with any questions, concerns, or complaints.    Lloyd Huger, MD   08/21/2020 6:42 AM

## 2020-08-19 ENCOUNTER — Other Ambulatory Visit: Payer: Medicare Other

## 2020-08-19 ENCOUNTER — Ambulatory Visit: Payer: Medicare Other

## 2020-08-19 ENCOUNTER — Ambulatory Visit: Payer: Medicare Other | Admitting: Oncology

## 2020-08-20 ENCOUNTER — Encounter: Payer: Self-pay | Admitting: Oncology

## 2020-08-20 ENCOUNTER — Inpatient Hospital Stay: Payer: Medicare Other

## 2020-08-20 ENCOUNTER — Other Ambulatory Visit: Payer: Self-pay

## 2020-08-20 ENCOUNTER — Inpatient Hospital Stay: Payer: Medicare Other | Admitting: Oncology

## 2020-08-20 VITALS — BP 114/76 | Temp 97.1°F

## 2020-08-20 VITALS — BP 130/72 | HR 68 | Temp 98.1°F | Wt 144.0 lb

## 2020-08-20 DIAGNOSIS — C259 Malignant neoplasm of pancreas, unspecified: Secondary | ICD-10-CM

## 2020-08-20 LAB — CBC WITH DIFFERENTIAL/PLATELET
Abs Immature Granulocytes: 0.08 10*3/uL — ABNORMAL HIGH (ref 0.00–0.07)
Basophils Absolute: 0 10*3/uL (ref 0.0–0.1)
Basophils Relative: 1 %
Eosinophils Absolute: 0.3 10*3/uL (ref 0.0–0.5)
Eosinophils Relative: 4 %
HCT: 29.4 % — ABNORMAL LOW (ref 36.0–46.0)
Hemoglobin: 9.6 g/dL — ABNORMAL LOW (ref 12.0–15.0)
Immature Granulocytes: 1 %
Lymphocytes Relative: 13 %
Lymphs Abs: 1 10*3/uL (ref 0.7–4.0)
MCH: 29.6 pg (ref 26.0–34.0)
MCHC: 32.7 g/dL (ref 30.0–36.0)
MCV: 90.7 fL (ref 80.0–100.0)
Monocytes Absolute: 0.9 10*3/uL (ref 0.1–1.0)
Monocytes Relative: 13 %
Neutro Abs: 5.1 10*3/uL (ref 1.7–7.7)
Neutrophils Relative %: 68 %
Platelets: 285 10*3/uL (ref 150–400)
RBC: 3.24 MIL/uL — ABNORMAL LOW (ref 3.87–5.11)
RDW: 18.8 % — ABNORMAL HIGH (ref 11.5–15.5)
WBC: 7.4 10*3/uL (ref 4.0–10.5)
nRBC: 0 % (ref 0.0–0.2)

## 2020-08-20 LAB — COMPREHENSIVE METABOLIC PANEL
ALT: 19 U/L (ref 0–44)
AST: 28 U/L (ref 15–41)
Albumin: 3.2 g/dL — ABNORMAL LOW (ref 3.5–5.0)
Alkaline Phosphatase: 89 U/L (ref 38–126)
Anion gap: 12 (ref 5–15)
BUN: 20 mg/dL (ref 8–23)
CO2: 23 mmol/L (ref 22–32)
Calcium: 9.7 mg/dL (ref 8.9–10.3)
Chloride: 103 mmol/L (ref 98–111)
Creatinine, Ser: 1.44 mg/dL — ABNORMAL HIGH (ref 0.44–1.00)
GFR, Estimated: 35 mL/min — ABNORMAL LOW (ref >60)
Glucose, Bld: 159 mg/dL — ABNORMAL HIGH (ref 70–99)
Potassium: 3.7 mmol/L (ref 3.5–5.1)
Sodium: 138 mmol/L (ref 135–145)
Total Bilirubin: 0.8 mg/dL (ref 0.3–1.2)
Total Protein: 6.5 g/dL (ref 6.5–8.1)

## 2020-08-20 LAB — CANCER ANTIGEN 19-9: CA 19-9: 13932 U/mL — ABNORMAL HIGH (ref 0–35)

## 2020-08-20 MED ORDER — SODIUM CHLORIDE 0.9 % IV SOLN
Freq: Once | INTRAVENOUS | Status: AC
Start: 2020-08-20 — End: 2020-08-20
  Filled 2020-08-20: qty 250

## 2020-08-20 MED ORDER — HEPARIN SOD (PORK) LOCK FLUSH 100 UNIT/ML IV SOLN
INTRAVENOUS | Status: AC
  Filled 2020-08-20: qty 5

## 2020-08-20 MED ORDER — HEPARIN SOD (PORK) LOCK FLUSH 100 UNIT/ML IV SOLN
500.0000 [IU] | Freq: Once | INTRAVENOUS | Status: AC
  Administered 2020-08-20: 500 [IU] via INTRAVENOUS
  Filled 2020-08-20: qty 5

## 2020-08-20 MED ORDER — PROCHLORPERAZINE MALEATE 10 MG PO TABS
10.0000 mg | ORAL_TABLET | Freq: Once | ORAL | Status: AC
  Administered 2020-08-20: 10 mg via ORAL
  Filled 2020-08-20: qty 1

## 2020-08-20 MED ORDER — SODIUM CHLORIDE 0.9% FLUSH
10.0000 mL | INTRAVENOUS | Status: DC | PRN
  Administered 2020-08-20: 10 mL
  Filled 2020-08-20: qty 10

## 2020-08-20 MED ORDER — SODIUM CHLORIDE 0.9 % IV SOLN
1800.0000 mg | Freq: Once | INTRAVENOUS | Status: AC
  Administered 2020-08-20: 1800 mg via INTRAVENOUS
  Filled 2020-08-20: qty 26.3

## 2020-08-20 MED ORDER — HEPARIN SOD (PORK) LOCK FLUSH 100 UNIT/ML IV SOLN
500.0000 [IU] | Freq: Once | INTRAVENOUS | Status: DC | PRN
  Filled 2020-08-20: qty 5

## 2020-08-20 NOTE — Progress Notes (Signed)
Patient denies any concerns today.  

## 2020-08-20 NOTE — Progress Notes (Signed)
Patient tolerated chemo infusion well, no concerns voiced. Patient discharged. Stable. 

## 2020-08-22 ENCOUNTER — Inpatient Hospital Stay: Payer: Medicare Other

## 2020-08-22 ENCOUNTER — Other Ambulatory Visit: Payer: Self-pay

## 2020-08-22 VITALS — BP 132/59 | HR 63 | Temp 95.4°F | Resp 18

## 2020-08-22 DIAGNOSIS — C259 Malignant neoplasm of pancreas, unspecified: Secondary | ICD-10-CM

## 2020-08-22 DIAGNOSIS — E860 Dehydration: Secondary | ICD-10-CM

## 2020-08-22 MED ORDER — DEXAMETHASONE SODIUM PHOSPHATE 10 MG/ML IJ SOLN
10.0000 mg | Freq: Once | INTRAMUSCULAR | Status: AC
  Administered 2020-08-22: 10 mg via INTRAVENOUS
  Filled 2020-08-22: qty 1

## 2020-08-22 MED ORDER — HEPARIN SOD (PORK) LOCK FLUSH 100 UNIT/ML IV SOLN
500.0000 [IU] | Freq: Once | INTRAVENOUS | Status: AC | PRN
  Administered 2020-08-22: 500 [IU]
  Filled 2020-08-22: qty 5

## 2020-08-22 MED ORDER — SODIUM CHLORIDE 0.9% FLUSH
10.0000 mL | Freq: Once | INTRAVENOUS | Status: AC
  Administered 2020-08-22: 10 mL via INTRAVENOUS
  Filled 2020-08-22: qty 10

## 2020-08-22 MED ORDER — SODIUM CHLORIDE 0.9 % IV SOLN
Freq: Once | INTRAVENOUS | Status: AC
  Filled 2020-08-22: qty 250

## 2020-08-22 NOTE — Progress Notes (Signed)
Patient tolerated infusion well, no concerns voiced. Patient discharged. Stable. 

## 2020-08-27 ENCOUNTER — Inpatient Hospital Stay: Payer: Medicare Other

## 2020-08-27 ENCOUNTER — Inpatient Hospital Stay (HOSPITAL_BASED_OUTPATIENT_CLINIC_OR_DEPARTMENT_OTHER): Payer: Medicare Other | Admitting: Oncology

## 2020-08-27 ENCOUNTER — Encounter: Payer: Self-pay | Admitting: Oncology

## 2020-08-27 VITALS — BP 131/68 | HR 73 | Temp 96.7°F | Resp 20 | Wt 144.1 lb

## 2020-08-27 DIAGNOSIS — C259 Malignant neoplasm of pancreas, unspecified: Secondary | ICD-10-CM

## 2020-08-27 DIAGNOSIS — Z95828 Presence of other vascular implants and grafts: Secondary | ICD-10-CM

## 2020-08-27 LAB — COMPREHENSIVE METABOLIC PANEL
ALT: 58 U/L — ABNORMAL HIGH (ref 0–44)
AST: 74 U/L — ABNORMAL HIGH (ref 15–41)
Albumin: 3.4 g/dL — ABNORMAL LOW (ref 3.5–5.0)
Alkaline Phosphatase: 83 U/L (ref 38–126)
Anion gap: 12 (ref 5–15)
BUN: 16 mg/dL (ref 8–23)
CO2: 21 mmol/L — ABNORMAL LOW (ref 22–32)
Calcium: 9.6 mg/dL (ref 8.9–10.3)
Chloride: 103 mmol/L (ref 98–111)
Creatinine, Ser: 1.22 mg/dL — ABNORMAL HIGH (ref 0.44–1.00)
GFR, Estimated: 43 mL/min — ABNORMAL LOW (ref >60)
Glucose, Bld: 197 mg/dL — ABNORMAL HIGH (ref 70–99)
Potassium: 3.4 mmol/L — ABNORMAL LOW (ref 3.5–5.1)
Sodium: 136 mmol/L (ref 135–145)
Total Bilirubin: 0.9 mg/dL (ref 0.3–1.2)
Total Protein: 6.7 g/dL (ref 6.5–8.1)

## 2020-08-27 LAB — CBC WITH DIFFERENTIAL/PLATELET
Abs Immature Granulocytes: 0.02 10*3/uL (ref 0.00–0.07)
Basophils Absolute: 0 10*3/uL (ref 0.0–0.1)
Basophils Relative: 1 %
Eosinophils Absolute: 0.1 10*3/uL (ref 0.0–0.5)
Eosinophils Relative: 1 %
HCT: 28.9 % — ABNORMAL LOW (ref 36.0–46.0)
Hemoglobin: 9.6 g/dL — ABNORMAL LOW (ref 12.0–15.0)
Immature Granulocytes: 1 %
Lymphocytes Relative: 30 %
Lymphs Abs: 1.2 10*3/uL (ref 0.7–4.0)
MCH: 30.1 pg (ref 26.0–34.0)
MCHC: 33.2 g/dL (ref 30.0–36.0)
MCV: 90.6 fL (ref 80.0–100.0)
Monocytes Absolute: 0.3 10*3/uL (ref 0.1–1.0)
Monocytes Relative: 7 %
Neutro Abs: 2.3 10*3/uL (ref 1.7–7.7)
Neutrophils Relative %: 60 %
Platelets: 152 10*3/uL (ref 150–400)
RBC: 3.19 MIL/uL — ABNORMAL LOW (ref 3.87–5.11)
RDW: 17.8 % — ABNORMAL HIGH (ref 11.5–15.5)
WBC: 3.8 10*3/uL — ABNORMAL LOW (ref 4.0–10.5)
nRBC: 0.8 % — ABNORMAL HIGH (ref 0.0–0.2)

## 2020-08-27 MED ORDER — SODIUM CHLORIDE 0.9% FLUSH
10.0000 mL | Freq: Once | INTRAVENOUS | Status: AC
  Administered 2020-08-27: 10 mL via INTRAVENOUS
  Filled 2020-08-27: qty 10

## 2020-08-27 MED ORDER — PROCHLORPERAZINE MALEATE 10 MG PO TABS
10.0000 mg | ORAL_TABLET | Freq: Once | ORAL | Status: AC
  Administered 2020-08-27: 10 mg via ORAL
  Filled 2020-08-27: qty 1

## 2020-08-27 MED ORDER — HEPARIN SOD (PORK) LOCK FLUSH 100 UNIT/ML IV SOLN
INTRAVENOUS | Status: AC
  Filled 2020-08-27: qty 5

## 2020-08-27 MED ORDER — SODIUM CHLORIDE 0.9 % IV SOLN
1800.0000 mg | Freq: Once | INTRAVENOUS | Status: AC
  Administered 2020-08-27: 1800 mg via INTRAVENOUS
  Filled 2020-08-27: qty 26.3

## 2020-08-27 MED ORDER — SODIUM CHLORIDE 0.9 % IV SOLN
Freq: Once | INTRAVENOUS | Status: AC
  Filled 2020-08-27: qty 250

## 2020-08-27 MED ORDER — HEPARIN SOD (PORK) LOCK FLUSH 100 UNIT/ML IV SOLN
500.0000 [IU] | Freq: Once | INTRAVENOUS | Status: AC | PRN
Start: 2020-08-27 — End: 2020-08-27
  Administered 2020-08-27: 500 [IU]
  Filled 2020-08-27: qty 5

## 2020-08-27 NOTE — Progress Notes (Unsigned)
Patient denies any concerns today.  

## 2020-08-28 NOTE — Progress Notes (Signed)
[place]  [phone] [phone]  ID: Annalaura Sauseda Knippel OB: Jul 13, 1931  MR#: [id number]  XBD#:[id number]  Patient Care Team: Delsa Grana, PA-C as PCP - General (Family Medicine) Marlowe Sax, MD as Referring Physician (Internal Medicine) Birder Robson, MD as Referring Physician (Ophthalmology) Lloyd Huger, MD as Consulting Physician (Hematology and Oncology)  CHIEF COMPLAINT: Stage IV pancreatic cancer.  INTERVAL HISTORY: Patient returns to clinic today for further evaluation and reconsideration of cycle 5, day 8 of single agent gemcitabine.  She was last seen in clinic on 08/20/2020 prior to cycle 5-day 1.  She appears to be tolerating treatment well.  Has occasional low abdominal/pelvic pain.  Pain goes away on its own.  Patient does state she felt better after receiving IV fluids a few days after treatment.  She would like to have fluids again if possible.  She denies any new concerns.  Constipation is stable.  Appetite and weight appear stable. She has no neurologic complaints.  She denies any recent fevers or illnesses.  She denies any pain.  She has no chest pain, shortness of breath, cough, or hemoptysis.  She denies any nausea, vomiting, or diarrhea.  She has noted no changes in her bowel movements and denies any melena or hematochezia.  She has no urinary complaints.  Patient offers no further specific complaints today.  REVIEW OF SYSTEMS:   Review of Systems  Constitutional: Positive for malaise/fatigue. Negative for fever and weight loss.  Respiratory: Negative.  Negative for cough and hemoptysis.   Cardiovascular: Negative.  Negative for chest pain and leg swelling.  Gastrointestinal: Positive for abdominal pain. Negative for blood in stool, constipation and melena.  Genitourinary: Negative.  Negative for dysuria.  Musculoskeletal: Negative.  Negative for back pain.  Skin: Negative.  Negative for rash.  Neurological:  Positive for weakness. Negative for dizziness, focal weakness and headaches.  Psychiatric/Behavioral: Negative.  The patient is not nervous/anxious.     As per HPI. Otherwise, a complete review of systems is negative.  PAST MEDICAL HISTORY: Past Medical History:  Diagnosis Date  . Allergy   . Anxiety and depression   . Anxiety and depression   . Arthritis   . Breast cancer ([place]) 1990   LT MASTECTOMY  . Cancer ([place]) 1990   LT BREAST MASTECTOMY  . CKD (chronic kidney disease), stage III ([place])   . Diabetes mellitus without complication ([place])   . Family history of breast cancer   . Family history of pancreatic cancer   . GERD (gastroesophageal reflux disease)   . Hypertension   . Hypertensive CKD (chronic kidney disease)   . Personal history of chemotherapy   . Status post chemotherapy    breast cancer  . Temporal arteritis ([place]) 10/28/2016   Overview:  Right temporal arteritis  Biopsy proven 11/08/2016    PAST SURGICAL HISTORY: Past Surgical History:  Procedure Laterality Date  . ABDOMINAL HYSTERECTOMY    . BREAST SURGERY    . COLONOSCOPY WITH PROPOFOL N/A 06/04/2015   Procedure: COLONOSCOPY WITH PROPOFOL;  Surgeon: Manya Silvas, MD;  Location: [facility] ENDOSCOPY;  Service: Endoscopy;  Laterality: N/A;  . IR IMAGING GUIDED PORT INSERTION  05/09/2020  . MASTECTOMY Left 1990   BREAST CA  . TEMPORAL ARTERY BIOPSY / LIGATION  11/2016    FAMILY HISTORY: Family History  Problem Relation Age of Onset  . Breast cancer Mother 54  . Lung cancer Father        lung  . Cancer  Maternal Aunt        unk types  . Cancer Maternal Uncle        unk types  . Pancreatic cancer Cousin     ADVANCED DIRECTIVES (Y/N):  N  HEALTH MAINTENANCE: Social History   Tobacco Use  . Smoking status: Never Smoker  . Smokeless tobacco: Never Used  Vaping Use  . Vaping Use: Never used  Substance Use Topics  . Alcohol use: No    Alcohol/week: 0.0 standard drinks  . Drug use: No      Colonoscopy:  PAP:  Bone density:  Lipid panel:  Allergies  Allergen Reactions  . Hydrochlorothiazide Other (See Comments)    Hypokalemia   . Lisinopril Other (See Comments) and Cough    Hands numb   . Metformin And Related     Fatigue, "heart was beating slower"    Current Outpatient Medications  Medication Sig Dispense Refill  . acetaminophen (TYLENOL) 325 MG tablet Take 650 mg by mouth every 6 (six) hours as needed for mild pain.    Marland Kitchen ascorbic acid (VITAMIN C) 1000 MG tablet Take 1,000 mg by mouth 3 (three) times a week.    Marland Kitchen aspirin 81 MG tablet Take 81 mg by mouth 3 (three) times a week.    . fluticasone (FLONASE) 50 MCG/ACT nasal spray Place 2 sprays into both nostrils daily. 48 g 3  . lidocaine-prilocaine (EMLA) cream Apply to port 1 hour prior to appointment. Cover with plastic wrap. 30 g 3  . Multiple Vitamin (MULTIVITAMIN) tablet Take 1 tablet by mouth daily.    Dellia Nims CALCIUM + D3 500-200 MG-UNIT TABS Take 1 tablet by mouth 2 (two) times daily with a meal.  11  . prochlorperazine (COMPAZINE) 10 MG tablet Take 1 tablet (10 mg total) by mouth every 6 (six) hours as needed (Nausea or vomiting). 60 tablet 3  . spironolactone (ALDACTONE) 50 MG tablet TAKE 1 TABLET BY MOUTH EVERY DAY 90 tablet 3   No current facility-administered medications for this visit.    OBJECTIVE: Vitals:   08/27/20 0926  BP: 131/68  Pulse: 73  Resp: 20  Temp: (!) 96.7 F (35.9 C)     Body mass index is 24.73 kg/m.    ECOG FS:1 - Symptomatic but completely ambulatory  Physical Exam Constitutional:      General: Vital signs are normal.     Appearance: Normal appearance.  HENT:     Head: Normocephalic and atraumatic.  Eyes:     Pupils: Pupils are equal, round, and reactive to light.  Cardiovascular:     Rate and Rhythm: Normal rate and regular rhythm.     Heart sounds: Normal heart sounds. No murmur heard.   Pulmonary:     Effort: Pulmonary effort is normal.     Breath  sounds: Normal breath sounds. No wheezing.  Abdominal:     General: Bowel sounds are normal. There is no distension.     Palpations: Abdomen is soft.     Tenderness: There is no abdominal tenderness.  Musculoskeletal:        General: No edema. Normal range of motion.     Cervical back: Normal range of motion.  Skin:    General: Skin is warm and dry.     Findings: No rash.  Neurological:     Mental Status: She is alert and oriented to person, place, and time.  Psychiatric:        Judgment: Judgment normal.  LAB RESULTS:  Lab Results  Component Value Date   NA 136 08/27/2020   K 3.4 (L) 08/27/2020   CL 103 08/27/2020   CO2 21 (L) 08/27/2020   GLUCOSE 197 (H) 08/27/2020   BUN 16 08/27/2020   CREATININE 1.22 (H) 08/27/2020   CALCIUM 9.6 08/27/2020   PROT 6.7 08/27/2020   ALBUMIN 3.4 (L) 08/27/2020   AST 74 (H) 08/27/2020   ALT 58 (H) 08/27/2020   ALKPHOS 83 08/27/2020   BILITOT 0.9 08/27/2020   GFRNONAA 43 (L) 08/27/2020   GFRAA 36 (L) 04/11/2020    Lab Results  Component Value Date   WBC 3.8 (L) 08/27/2020   NEUTROABS 2.3 08/27/2020   HGB 9.6 (L) 08/27/2020   HCT 28.9 (L) 08/27/2020   MCV 90.6 08/27/2020   PLT 152 08/27/2020     STUDIES: No results found.  ASSESSMENT: Stage IV pancreatic cancer.  PLAN:   1.  Stage IV pancreatic cancer:  Confirmed by biopsy. -PET scan from 04/09/2020 confirmed stage of disease. -CA 19-9 initially significantly elevated at 19,661 and is slowly trended down. -Initial plan was to treat on days 1, 8 and 15 on a 28-day cycle but due to intolerance (thrombocytopenia), she will receive treatments on days 1 and day 8 only. 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 -Other options are kept Cytovene which will be discussed at later appointment should she progress. -Proceed with cycle 5-day 8 of treatment today. -Labs from today show hemoglobin are stable.  Slight bump in LFTs.  Will monitor.  2.  MGUS:  -Patient's  most recent M spike is only 0.2.   -This is likely clinically insignificant.   -No further work-up is necessary.  3.  History of colon cancer:  -Patient had stage I colon cancer 8+ years ago  4.  History of breast cancer:  -31 years ago.   -Unclear stage or type of malignancy.  5.  Renal insufficiency:  -Baseline creatinine is between 1.2-1.5.  Labs from 08/27/2020 show a creatinine of 1.22.  This is improved. -Return to clinic on Monday for IV fluids.   -We will continue to monitor closely and supplement with IV fluids as needed.  6.  Leukopenia: -Labs from 08/27/2020 show a white count of 3.8 and ANC of 2.3. -Continue to monitor.  7.  Anemia:  -Chronic and unchanged.   -Patient's hemoglobin is 9.6 today.  8.  Thrombocytopenia:  -Resolved.   -Platelet count is 152 today -Proceed with treatment -Will only give treatment on day 1 and 8 as above.  9.  Weight loss:  -Patient's weight has stabilized.   -Appreciate dietary input.  Disposition: -RTC on Monday for IV fluids. -RTC in 2 weeks for lab work, MD assessment and next cycle of treatment.  Patient expressed understanding and was in agreement with this plan. She also understands that She can call clinic at any time with any questions, concerns, or complaints.   Greater than 50% was spent in counseling and coordination of care with this patient including but not limited to discussion of the relevant topics above (See A&P) including, but not limited to diagnosis and management of acute and chronic medical conditions.    Jacquelin Hawking, NP   08/28/2020 9:00 AM

## 2020-09-01 ENCOUNTER — Inpatient Hospital Stay: Payer: Medicare Other

## 2020-09-01 VITALS — BP 132/62 | HR 62 | Temp 96.9°F | Resp 18

## 2020-09-01 DIAGNOSIS — E860 Dehydration: Secondary | ICD-10-CM

## 2020-09-01 DIAGNOSIS — C259 Malignant neoplasm of pancreas, unspecified: Secondary | ICD-10-CM | POA: Diagnosis not present

## 2020-09-01 MED ORDER — SODIUM CHLORIDE 0.9 % IV SOLN: Freq: Once | INTRAVENOUS | Status: DC

## 2020-09-01 MED ORDER — SODIUM CHLORIDE 0.9% FLUSH
10.0000 mL | Freq: Once | INTRAVENOUS | Status: AC | PRN
  Administered 2020-09-01: 10 mL
  Filled 2020-09-01: qty 10

## 2020-09-01 MED ORDER — DEXAMETHASONE SODIUM PHOSPHATE 10 MG/ML IJ SOLN
10.0000 mg | Freq: Once | INTRAMUSCULAR | Status: AC
  Administered 2020-09-01: 10 mg via INTRAVENOUS

## 2020-09-01 MED ORDER — SODIUM CHLORIDE 0.9 % IV SOLN
Freq: Once | INTRAVENOUS | Status: AC
  Filled 2020-09-01: qty 250

## 2020-09-01 MED ORDER — HEPARIN SOD (PORK) LOCK FLUSH 100 UNIT/ML IV SOLN
500.0000 [IU] | Freq: Once | INTRAVENOUS | Status: AC | PRN
  Administered 2020-09-01: 500 [IU]
  Filled 2020-09-01: qty 5

## 2020-09-01 NOTE — Progress Notes (Signed)
IV hydration with supportive meds received today. No complaints at discharge.

## 2020-09-04 ENCOUNTER — Inpatient Hospital Stay: Payer: Medicare Other

## 2020-09-04 NOTE — Progress Notes (Signed)
Nutrition Follow-up:  Patient with stage IV pancreatic cancer.  Patient receiving chemotherapy.   Spoke with patient's daughter via phone.  Daughter reports that patient is eating better and taste is coming back.  Did not like the [employer] or orgain shakes, too sweet.  Daughter reports that bowels are moving better.  Patient eating more beans (pintos), eggs, cheese, milk in grits and oatmeal for protein.  Had chicken pie recently that she said was good.  Drinking gingerale and gatorade.     Medications: reviewed  Labs: reviewed  Anthropometrics:   Weight 144 lb 1.6 oz on 2/16 stable   NUTRITION DIAGNOSIS: Inadequate oral intake continues    INTERVENTION:  Encouraged high calorie, high protein foods to prevent weight loss Encouraged small frequent meals Daughter has contact information    MONITORING, EVALUATION, GOAL: weight trends, intake   NEXT VISIT: March 24 phone call  Veronica Colon, [place], [place] Registered Dietitian [phone] (mobile)

## 2020-09-06 NOTE — Progress Notes (Signed)
[place]  Telephone:([phone] [phone]  ID: Veronica Colon OB: [birth year]  MR#: [id number]  ERD#:[id number]  Patient Care Team: Delsa Grana, PA-C as PCP - General (Family Medicine) Marlowe Sax, MD as Referring Physician (Internal Medicine) Birder Robson, MD as Referring Physician (Ophthalmology) Lloyd Huger, MD as Consulting Physician (Hematology and Oncology)  CHIEF COMPLAINT: Stage IV pancreatic cancer.  INTERVAL HISTORY: Patient returns to clinic today for further evaluation and consideration of cycle 6, day 1 of single agent gemcitabine.  She continues to have chronic weakness and fatigue.  She has noticed a mild increased abdominal pain.  She also has an improved appetite.  Her constipation is better controlled.  She has no neurologic complaints.  She denies any recent fevers or illnesses.  She denies any pain.  She has no chest pain, shortness of breath, cough, or hemoptysis.  She denies any nausea, vomiting, or diarrhea.  She has noted no changes in her bowel movements and denies any melena or hematochezia.  She has no urinary complaints.  Patient offers no further specific complaints today.  REVIEW OF SYSTEMS:   Review of Systems  Constitutional: Positive for malaise/fatigue. Negative for fever and weight loss.  Respiratory: Negative.  Negative for cough and hemoptysis.   Cardiovascular: Negative.  Negative for chest pain and leg swelling.  Gastrointestinal: Negative.  Negative for abdominal pain, blood in stool, constipation and melena.  Genitourinary: Negative.  Negative for dysuria.  Musculoskeletal: Negative.  Negative for back pain.  Skin: Negative.  Negative for rash.  Neurological: Positive for weakness. Negative for dizziness, focal weakness and headaches.  Psychiatric/Behavioral: Negative.  The patient is not nervous/anxious.     As per HPI. Otherwise, a complete review of systems is negative.  PAST MEDICAL  HISTORY: Past Medical History:  Diagnosis Date  . Allergy   . Anxiety and depression   . Anxiety and depression   . Arthritis   . Breast cancer ([place]) 1990   LT MASTECTOMY  . Cancer ([place]) 1990   LT BREAST MASTECTOMY  . CKD (chronic kidney disease), stage III ([place])   . Diabetes mellitus without complication ([place])   . Family history of breast cancer   . Family history of pancreatic cancer   . GERD (gastroesophageal reflux disease)   . Hypertension   . Hypertensive CKD (chronic kidney disease)   . Personal history of chemotherapy   . Status post chemotherapy    breast cancer  . Temporal arteritis ([place]) 10/28/2016   Overview:  Right temporal arteritis  Biopsy proven 11/08/2016    PAST SURGICAL HISTORY: Past Surgical History:  Procedure Laterality Date  . ABDOMINAL HYSTERECTOMY    . BREAST SURGERY    . COLONOSCOPY WITH PROPOFOL N/A 06/04/2015   Procedure: COLONOSCOPY WITH PROPOFOL;  Surgeon: Manya Silvas, MD;  Location: [facility] ENDOSCOPY;  Service: Endoscopy;  Laterality: N/A;  . IR IMAGING GUIDED PORT INSERTION  05/09/2020  . MASTECTOMY Left 1990   BREAST CA  . TEMPORAL ARTERY BIOPSY / LIGATION  11/2016    FAMILY HISTORY: Family History  Problem Relation Age of Onset  . Breast cancer Mother 75  . Lung cancer Father        lung  . Cancer Maternal Aunt        unk types  . Cancer Maternal Uncle        unk types  . Pancreatic cancer Cousin     ADVANCED DIRECTIVES (Y/N):  N  HEALTH MAINTENANCE: Social History  Tobacco Use  . Smoking status: Never Smoker  . Smokeless tobacco: Never Used  Vaping Use  . Vaping Use: Never used  Substance Use Topics  . Alcohol use: No    Alcohol/week: 0.0 standard drinks  . Drug use: No     Colonoscopy:  PAP:  Bone density:  Lipid panel:  Allergies  Allergen Reactions  . Hydrochlorothiazide Other (See Comments)    Hypokalemia   . Lisinopril Other (See Comments) and Cough    Hands numb   . Metformin And Related      Fatigue, "heart was beating slower"    Current Outpatient Medications  Medication Sig Dispense Refill  . acetaminophen (TYLENOL) 325 MG tablet Take 650 mg by mouth every 6 (six) hours as needed for mild pain.    Marland Kitchen ascorbic acid (VITAMIN C) 1000 MG tablet Take 1,000 mg by mouth 3 (three) times a week.    Marland Kitchen aspirin 81 MG tablet Take 81 mg by mouth 3 (three) times a week.    . fluticasone (FLONASE) 50 MCG/ACT nasal spray Place 2 sprays into both nostrils daily. 48 g 3  . lidocaine-prilocaine (EMLA) cream Apply to port 1 hour prior to appointment. Cover with plastic wrap. 30 g 3  . Multiple Vitamin (MULTIVITAMIN) tablet Take 1 tablet by mouth daily.    Dellia Nims CALCIUM + D3 500-200 MG-UNIT TABS Take 1 tablet by mouth 2 (two) times daily with a meal.  11  . prochlorperazine (COMPAZINE) 10 MG tablet Take 1 tablet (10 mg total) by mouth every 6 (six) hours as needed (Nausea or vomiting). 60 tablet 3  . spironolactone (ALDACTONE) 50 MG tablet TAKE 1 TABLET BY MOUTH EVERY DAY 90 tablet 3  . HYDROcodone-acetaminophen (NORCO/VICODIN) 5-325 MG tablet Take 1 tablet by mouth every 6 (six) hours as needed for moderate pain. 30 tablet 0   No current facility-administered medications for this visit.    OBJECTIVE: Vitals:   09/10/20 0929  BP: 131/75  Pulse: 68  Resp: 18  Temp: (!) 96.8 F (36 C)     Body mass index is 24.65 kg/m.    ECOG FS:1 - Symptomatic but completely ambulatory  General: Thin, no acute distress. Eyes: Pink conjunctiva, anicteric sclera. HEENT: Normocephalic, moist mucous membranes. Lungs: No audible wheezing or coughing. Heart: Regular rate and rhythm. Abdomen: Soft, nontender, no obvious distention. Musculoskeletal: No edema, cyanosis, or clubbing. Neuro: Alert, answering all questions appropriately. Cranial nerves grossly intact. Skin: No rashes or petechiae noted. Psych: Normal affect.   LAB RESULTS:  Lab Results  Component Value Date   NA 137 09/10/2020   K 3.8  09/10/2020   CL 103 09/10/2020   CO2 23 09/10/2020   GLUCOSE 149 (H) 09/10/2020   BUN 14 09/10/2020   CREATININE 1.36 (H) 09/10/2020   CALCIUM 9.6 09/10/2020   PROT 6.1 (L) 09/10/2020   ALBUMIN 3.4 (L) 09/10/2020   AST 28 09/10/2020   ALT 23 09/10/2020   ALKPHOS 83 09/10/2020   BILITOT 1.1 09/10/2020   GFRNONAA 38 (L) 09/10/2020   GFRAA 36 (L) 04/11/2020    Lab Results  Component Value Date   WBC 4.7 09/10/2020   NEUTROABS 3.2 09/10/2020   HGB 8.7 (L) 09/10/2020   HCT 26.6 (L) 09/10/2020   MCV 94.7 09/10/2020   PLT 213 09/10/2020     STUDIES: No results found.  ASSESSMENT: Stage IV pancreatic cancer.  PLAN:   1.  Stage IV pancreatic cancer: Omental biopsy confirmed results.  PET scans  results from April 09, 2020 reviewed independently confirming stage of disease.  Patient CA 19-9 was also significantly elevated at 19,661 and trended down to 10,124.  Unfortunately CA 19-9 is trending back up to 15,703.  She also has increased abdominal pain concerning for progression of disease.  Proceed with cycle 6, day 1 of treatment today.  Return to clinic in 1 week for further evaluation and consideration of cycle 6, day 8.  Patient will then have PET scan in approximately 2 weeks for restaging purposes. 2.  MGUS: Patient's most recent M spike is only 0.2.  This is likely clinically insignificant.  No further work-up is necessary. 3.  History of colon cancer: Patient had stage I colon cancer 8+ years ago 4.  History of breast cancer: 31 years ago.  Unclear stage or type of malignancy. 5.  Renal insufficiency: Chronic and unchanged.  Patient's creatinine is 1.36.  Return to clinic on Friday for IV fluids.  Possibly secondary to obstruction from omental caking.   6.  Leukopenia: Resolved. 7.  Anemia: Hemoglobin has trended down slightly to 8.7, monitor. 8.  Thrombocytopenia: Resolved.  Will only give treatment on day 1 and 8. 9.  Weight loss: Patient's weight has stabilized.   Appreciate dietary input.  Patient expressed understanding and was in agreement with this plan. She also understands that She can call clinic at any time with any questions, concerns, or complaints.    Lloyd Huger, MD   09/12/2020 5:44 AM

## 2020-09-10 ENCOUNTER — Inpatient Hospital Stay: Payer: Medicare Other | Attending: Oncology

## 2020-09-10 ENCOUNTER — Encounter: Payer: Self-pay | Admitting: Oncology

## 2020-09-10 ENCOUNTER — Other Ambulatory Visit: Payer: Self-pay | Admitting: *Deleted

## 2020-09-10 ENCOUNTER — Inpatient Hospital Stay: Payer: Medicare Other | Admitting: Oncology

## 2020-09-10 ENCOUNTER — Inpatient Hospital Stay: Payer: Medicare Other

## 2020-09-10 ENCOUNTER — Other Ambulatory Visit: Payer: Self-pay

## 2020-09-10 VITALS — BP 131/75 | HR 68 | Temp 96.8°F | Resp 18 | Wt 143.6 lb

## 2020-09-10 DIAGNOSIS — C259 Malignant neoplasm of pancreas, unspecified: Secondary | ICD-10-CM

## 2020-09-10 DIAGNOSIS — M129 Arthropathy, unspecified: Secondary | ICD-10-CM | POA: Diagnosis not present

## 2020-09-10 DIAGNOSIS — Z5111 Encounter for antineoplastic chemotherapy: Secondary | ICD-10-CM | POA: Insufficient documentation

## 2020-09-10 DIAGNOSIS — I129 Hypertensive chronic kidney disease with stage 1 through stage 4 chronic kidney disease, or unspecified chronic kidney disease: Secondary | ICD-10-CM | POA: Diagnosis not present

## 2020-09-10 DIAGNOSIS — Z9221 Personal history of antineoplastic chemotherapy: Secondary | ICD-10-CM | POA: Diagnosis not present

## 2020-09-10 DIAGNOSIS — Z803 Family history of malignant neoplasm of breast: Secondary | ICD-10-CM | POA: Insufficient documentation

## 2020-09-10 DIAGNOSIS — Z800 Family history of malignant neoplasm of digestive organs: Secondary | ICD-10-CM | POA: Insufficient documentation

## 2020-09-10 DIAGNOSIS — D472 Monoclonal gammopathy: Secondary | ICD-10-CM | POA: Insufficient documentation

## 2020-09-10 DIAGNOSIS — E119 Type 2 diabetes mellitus without complications: Secondary | ICD-10-CM | POA: Diagnosis not present

## 2020-09-10 DIAGNOSIS — D649 Anemia, unspecified: Secondary | ICD-10-CM | POA: Diagnosis not present

## 2020-09-10 DIAGNOSIS — N183 Chronic kidney disease, stage 3 unspecified: Secondary | ICD-10-CM | POA: Diagnosis not present

## 2020-09-10 DIAGNOSIS — K5900 Constipation, unspecified: Secondary | ICD-10-CM | POA: Diagnosis not present

## 2020-09-10 DIAGNOSIS — Z95828 Presence of other vascular implants and grafts: Secondary | ICD-10-CM

## 2020-09-10 DIAGNOSIS — R109 Unspecified abdominal pain: Secondary | ICD-10-CM | POA: Insufficient documentation

## 2020-09-10 DIAGNOSIS — R634 Abnormal weight loss: Secondary | ICD-10-CM | POA: Insufficient documentation

## 2020-09-10 LAB — CBC WITH DIFFERENTIAL/PLATELET
Abs Immature Granulocytes: 0.02 10*3/uL (ref 0.00–0.07)
Basophils Absolute: 0 10*3/uL (ref 0.0–0.1)
Basophils Relative: 0 %
Eosinophils Absolute: 0.1 10*3/uL (ref 0.0–0.5)
Eosinophils Relative: 2 %
HCT: 26.6 % — ABNORMAL LOW (ref 36.0–46.0)
Hemoglobin: 8.7 g/dL — ABNORMAL LOW (ref 12.0–15.0)
Immature Granulocytes: 0 %
Lymphocytes Relative: 16 %
Lymphs Abs: 0.7 10*3/uL (ref 0.7–4.0)
MCH: 31 pg (ref 26.0–34.0)
MCHC: 32.7 g/dL (ref 30.0–36.0)
MCV: 94.7 fL (ref 80.0–100.0)
Monocytes Absolute: 0.7 10*3/uL (ref 0.1–1.0)
Monocytes Relative: 15 %
Neutro Abs: 3.2 10*3/uL (ref 1.7–7.7)
Neutrophils Relative %: 67 %
Platelets: 213 10*3/uL (ref 150–400)
RBC: 2.81 MIL/uL — ABNORMAL LOW (ref 3.87–5.11)
RDW: 19.7 % — ABNORMAL HIGH (ref 11.5–15.5)
WBC: 4.7 10*3/uL (ref 4.0–10.5)
nRBC: 0 % (ref 0.0–0.2)

## 2020-09-10 LAB — COMPREHENSIVE METABOLIC PANEL
ALT: 23 U/L (ref 0–44)
AST: 28 U/L (ref 15–41)
Albumin: 3.4 g/dL — ABNORMAL LOW (ref 3.5–5.0)
Alkaline Phosphatase: 83 U/L (ref 38–126)
Anion gap: 11 (ref 5–15)
BUN: 14 mg/dL (ref 8–23)
CO2: 23 mmol/L (ref 22–32)
Calcium: 9.6 mg/dL (ref 8.9–10.3)
Chloride: 103 mmol/L (ref 98–111)
Creatinine, Ser: 1.36 mg/dL — ABNORMAL HIGH (ref 0.44–1.00)
GFR, Estimated: 38 mL/min — ABNORMAL LOW (ref >60)
Glucose, Bld: 149 mg/dL — ABNORMAL HIGH (ref 70–99)
Potassium: 3.8 mmol/L (ref 3.5–5.1)
Sodium: 137 mmol/L (ref 135–145)
Total Bilirubin: 1.1 mg/dL (ref 0.3–1.2)
Total Protein: 6.1 g/dL — ABNORMAL LOW (ref 6.5–8.1)

## 2020-09-10 MED ORDER — HYDROCODONE-ACETAMINOPHEN 5-325 MG PO TABS: 1.0000 | ORAL_TABLET | Freq: Four times a day (QID) | ORAL | 0 refills | Status: DC | PRN

## 2020-09-10 MED ORDER — SODIUM CHLORIDE 0.9 % IV SOLN
Freq: Once | INTRAVENOUS | Status: AC
  Filled 2020-09-10: qty 250

## 2020-09-10 MED ORDER — SODIUM CHLORIDE 0.9% FLUSH
10.0000 mL | Freq: Once | INTRAVENOUS | Status: AC
  Administered 2020-09-10: 10 mL via INTRAVENOUS
  Filled 2020-09-10: qty 10

## 2020-09-10 MED ORDER — SODIUM CHLORIDE 0.9 % IV SOLN
1800.0000 mg | Freq: Once | INTRAVENOUS | Status: AC
Start: 2020-09-10 — End: 2020-09-10
  Administered 2020-09-10: 1800 mg via INTRAVENOUS
  Filled 2020-09-10: qty 26.3

## 2020-09-10 MED ORDER — HEPARIN SOD (PORK) LOCK FLUSH 100 UNIT/ML IV SOLN
500.0000 [IU] | Freq: Once | INTRAVENOUS | Status: AC
  Administered 2020-09-10: 500 [IU] via INTRAVENOUS
  Filled 2020-09-10: qty 5

## 2020-09-10 MED ORDER — PROCHLORPERAZINE MALEATE 10 MG PO TABS
10.0000 mg | ORAL_TABLET | Freq: Once | ORAL | Status: AC
  Administered 2020-09-10: 10 mg via ORAL
  Filled 2020-09-10: qty 1

## 2020-09-10 MED ORDER — HEPARIN SOD (PORK) LOCK FLUSH 100 UNIT/ML IV SOLN
INTRAVENOUS | Status: AC
  Filled 2020-09-10: qty 5

## 2020-09-11 LAB — CANCER ANTIGEN 19-9: CA 19-9: 15703 U/mL — ABNORMAL HIGH (ref 0–35)

## 2020-09-12 ENCOUNTER — Other Ambulatory Visit: Payer: Self-pay

## 2020-09-12 ENCOUNTER — Inpatient Hospital Stay: Payer: Medicare Other

## 2020-09-12 VITALS — BP 134/66 | HR 63 | Temp 97.1°F | Resp 18

## 2020-09-12 DIAGNOSIS — C259 Malignant neoplasm of pancreas, unspecified: Secondary | ICD-10-CM

## 2020-09-12 MED ORDER — SODIUM CHLORIDE 0.9 % IV SOLN
Freq: Once | INTRAVENOUS | Status: AC
  Filled 2020-09-12: qty 250

## 2020-09-12 MED ORDER — HEPARIN SOD (PORK) LOCK FLUSH 100 UNIT/ML IV SOLN
500.0000 [IU] | Freq: Once | INTRAVENOUS | Status: AC | PRN
  Administered 2020-09-12: 500 [IU]
  Filled 2020-09-12: qty 5

## 2020-09-12 NOTE — Progress Notes (Signed)
Patient tolerated IV fluids infusion well, patient states she "feels better". Patient discharged with daughter. Stable.

## 2020-09-13 NOTE — Progress Notes (Signed)
Veronica Colon  Telephone:(336) [phone] Fax:(336) [phone]  ID: Veronica Colon OB: 11/28/1931  MR#: [id number]  JAS#:[id number]  Patient Care Team: Delsa Grana, PA-C as PCP - General (Family Medicine) Marlowe Sax, MD as Referring Physician (Internal Medicine) Birder Robson, MD as Referring Physician (Ophthalmology) Lloyd Huger, MD as Consulting Physician (Hematology and Oncology)  CHIEF COMPLAINT: Stage IV pancreatic cancer.  INTERVAL HISTORY: Patient returns to clinic today for further evaluation and consideration of cycle 6, day 8 of single agent gemcitabine.  Her abdominal pain is unchanged and only occurs intermittently at night.  She continues to have chronic weakness and fatigue.  She has an improved appetite.  Her constipation is better controlled.  She has no neurologic complaints.  She denies any recent fevers or illnesses.  She denies any other pain.  She has no chest pain, shortness of breath, cough, or hemoptysis.  She denies any nausea, vomiting, or diarrhea.  She has noted no changes in her bowel movements and denies any melena or hematochezia.  She has no urinary complaints.  Patient offers no further specific complaints today.  REVIEW OF SYSTEMS:   Review of Systems  Constitutional: Positive for malaise/fatigue. Negative for fever and weight loss.  Respiratory: Negative.  Negative for cough and hemoptysis.   Cardiovascular: Negative.  Negative for chest pain and leg swelling.  Gastrointestinal: Positive for abdominal pain. Negative for blood in stool, constipation and melena.  Genitourinary: Negative.  Negative for dysuria.  Musculoskeletal: Negative.  Negative for back pain.  Skin: Negative.  Negative for rash.  Neurological: Positive for weakness. Negative for dizziness, focal weakness and headaches.  Psychiatric/Behavioral: Negative.  The patient is not nervous/anxious.     As per HPI. Otherwise, a complete review of systems is  negative.  PAST MEDICAL HISTORY: Past Medical History:  Diagnosis Date  . Allergy   . Anxiety and depression   . Anxiety and depression   . Arthritis   . Breast cancer ([place]) 1990   LT MASTECTOMY  . Cancer ([place]) 1990   LT BREAST MASTECTOMY  . CKD (chronic kidney disease), stage III ([place])   . Diabetes mellitus without complication ([place])   . Family history of breast cancer   . Family history of pancreatic cancer   . GERD (gastroesophageal reflux disease)   . Hypertension   . Hypertensive CKD (chronic kidney disease)   . Personal history of chemotherapy   . Status post chemotherapy    breast cancer  . Temporal arteritis ([place]) 10/28/2016   Overview:  Right temporal arteritis  Biopsy proven 11/08/2016    PAST SURGICAL HISTORY: Past Surgical History:  Procedure Laterality Date  . ABDOMINAL HYSTERECTOMY    . BREAST SURGERY    . COLONOSCOPY WITH PROPOFOL N/A 06/04/2015   Procedure: COLONOSCOPY WITH PROPOFOL;  Surgeon: Manya Silvas, MD;  Location: [facility] ENDOSCOPY;  Service: Endoscopy;  Laterality: N/A;  . IR IMAGING GUIDED PORT INSERTION  05/09/2020  . MASTECTOMY Left 1990   BREAST CA  . TEMPORAL ARTERY BIOPSY / LIGATION  11/2016    FAMILY HISTORY: Family History  Problem Relation Age of Onset  . Breast cancer Mother 16  . Lung cancer Father        lung  . Cancer Maternal Aunt        unk types  . Cancer Maternal Uncle        unk types  . Pancreatic cancer Cousin     ADVANCED DIRECTIVES (Y/N):  N  HEALTH MAINTENANCE:  Social History   Tobacco Use  . Smoking status: Never Smoker  . Smokeless tobacco: Never Used  Vaping Use  . Vaping Use: Never used  Substance Use Topics  . Alcohol use: No    Alcohol/week: 0.0 standard drinks  . Drug use: No     Colonoscopy:  PAP:  Bone density:  Lipid panel:  Allergies  Allergen Reactions  . Hydrochlorothiazide Other (See Comments)    Hypokalemia   . Lisinopril Other (See Comments) and Cough    Hands numb   .  Metformin And Related     Fatigue, "heart was beating slower"    Current Outpatient Medications  Medication Sig Dispense Refill  . acetaminophen (TYLENOL) 325 MG tablet Take 650 mg by mouth every 6 (six) hours as needed for mild pain.    Marland Kitchen ascorbic acid (VITAMIN C) 1000 MG tablet Take 1,000 mg by mouth 3 (three) times a week.    Marland Kitchen aspirin 81 MG tablet Take 81 mg by mouth 3 (three) times a week.    . fluticasone (FLONASE) 50 MCG/ACT nasal spray Place 2 sprays into both nostrils daily. 48 g 3  . HYDROcodone-acetaminophen (NORCO/VICODIN) 5-325 MG tablet Take 1 tablet by mouth every 6 (six) hours as needed for moderate pain. 30 tablet 0  . Multiple Vitamin (MULTIVITAMIN) tablet Take 1 tablet by mouth daily.    Dellia Nims CALCIUM + D3 500-200 MG-UNIT TABS Take 1 tablet by mouth 2 (two) times daily with a meal.  11  . prochlorperazine (COMPAZINE) 10 MG tablet Take 1 tablet (10 mg total) by mouth every 6 (six) hours as needed (Nausea or vomiting). 60 tablet 3  . spironolactone (ALDACTONE) 50 MG tablet TAKE 1 TABLET BY MOUTH EVERY DAY 90 tablet 3  . lidocaine-prilocaine (EMLA) cream Apply to port 1 hour prior to appointment. Cover with plastic wrap. 30 g 3   No current facility-administered medications for this visit.   Facility-Administered Medications Ordered in Other Visits  Medication Dose Route Frequency Provider Last Rate Last Admin  . heparin lock flush 100 unit/mL  500 Units Intravenous Once Lloyd Huger, MD      . sodium chloride flush (NS) 0.9 % injection 10 mL  10 mL Intravenous PRN Lloyd Huger, MD   10 mL at 09/17/20 0907    OBJECTIVE: Vitals:   09/17/20 0918  BP: 137/75  Pulse: 77  Temp: 97.7 F (36.5 C)  SpO2: 100%     Body mass index is 24.73 kg/m.    ECOG FS:1 - Symptomatic but completely ambulatory  General: Thin, no acute distress. Eyes: Pink conjunctiva, anicteric sclera. HEENT: Normocephalic, moist mucous membranes. Lungs: No audible wheezing or  coughing. Heart: Regular rate and rhythm. Abdomen: Soft, nontender, no obvious distention. Musculoskeletal: No edema, cyanosis, or clubbing. Neuro: Alert, answering all questions appropriately. Cranial nerves grossly intact. Skin: No rashes or petechiae noted. Psych: Normal affect.   LAB RESULTS:  Lab Results  Component Value Date   NA 135 09/17/2020   K 3.7 09/17/2020   CL 103 09/17/2020   CO2 20 (L) 09/17/2020   GLUCOSE 229 (H) 09/17/2020   BUN 12 09/17/2020   CREATININE 1.35 (H) 09/17/2020   CALCIUM 9.3 09/17/2020   PROT 6.4 (L) 09/17/2020   ALBUMIN 3.2 (L) 09/17/2020   AST 45 (H) 09/17/2020   ALT 29 09/17/2020   ALKPHOS 78 09/17/2020   BILITOT 0.9 09/17/2020   GFRNONAA 38 (L) 09/17/2020   GFRAA 36 (L) 04/11/2020  Lab Results  Component Value Date   WBC 2.4 (L) 09/17/2020   NEUTROABS 1.2 (L) 09/17/2020   HGB 8.5 (L) 09/17/2020   HCT 26.1 (L) 09/17/2020   MCV 94.2 09/17/2020   PLT 217 09/17/2020     STUDIES: No results found.  ASSESSMENT: Stage IV pancreatic cancer.  PLAN:   1.  Stage IV pancreatic cancer: Omental biopsy confirmed results.  PET scans results from April 09, 2020 reviewed independently confirming stage of disease.  Patient CA 19-9 was also significantly elevated at 19,661 and trended down to 10,124.  Unfortunately CA 19-9 is trending back up to 15,703.  She also has increased abdominal pain concerning for progression of disease.  Proceed with cycle 6, day 8 of treatment today.  PET scan is scheduled for 1 week and then patient will return to clinic in 2 weeks for further evaluation and consideration of cycle 7, day 1.  Can consider adding Abraxane if patient in fact has progression of disease.   2.  MGUS: Patient's most recent M spike is only 0.2.  This is likely clinically insignificant.  No further work-up is necessary. 3.  History of colon cancer: Patient had stage I colon cancer 8+ years ago 4.  History of breast cancer: 31 years ago.   Unclear stage or type of malignancy. 5.  Renal insufficiency: Chronic and unchanged.  Patient's creatinine is 1.35 today.  Possibly secondary to obstruction from omental caking.   6.  Leukopenia: Mild.  Proceed with treatment as above. 7.  Anemia: Hemoglobin continues to slowly trend down and is now 8.5, monitor. 8.  Thrombocytopenia: Resolved.  Will only give treatment on day 1 and 8. 9.  Weight loss: Patient's weight has stabilized.  Appreciate dietary input.  Patient expressed understanding and was in agreement with this plan. She also understands that She can call clinic at any time with any questions, concerns, or complaints.    Lloyd Huger, MD   09/17/2020 9:59 AM

## 2020-09-17 ENCOUNTER — Inpatient Hospital Stay: Payer: Medicare Other

## 2020-09-17 ENCOUNTER — Encounter: Payer: Self-pay | Admitting: Oncology

## 2020-09-17 ENCOUNTER — Inpatient Hospital Stay: Payer: Medicare Other | Admitting: Oncology

## 2020-09-17 ENCOUNTER — Other Ambulatory Visit: Payer: Self-pay

## 2020-09-17 VITALS — Resp 20

## 2020-09-17 VITALS — BP 137/75 | HR 77 | Temp 97.7°F | Wt 144.1 lb

## 2020-09-17 DIAGNOSIS — C259 Malignant neoplasm of pancreas, unspecified: Secondary | ICD-10-CM

## 2020-09-17 LAB — CBC WITH DIFFERENTIAL/PLATELET
Abs Immature Granulocytes: 0.02 10*3/uL (ref 0.00–0.07)
Basophils Absolute: 0 10*3/uL (ref 0.0–0.1)
Basophils Relative: 1 %
Eosinophils Absolute: 0 10*3/uL (ref 0.0–0.5)
Eosinophils Relative: 0 %
HCT: 26.1 % — ABNORMAL LOW (ref 36.0–46.0)
Hemoglobin: 8.5 g/dL — ABNORMAL LOW (ref 12.0–15.0)
Immature Granulocytes: 1 %
Lymphocytes Relative: 35 %
Lymphs Abs: 0.8 10*3/uL (ref 0.7–4.0)
MCH: 30.7 pg (ref 26.0–34.0)
MCHC: 32.6 g/dL (ref 30.0–36.0)
MCV: 94.2 fL (ref 80.0–100.0)
Monocytes Absolute: 0.3 10*3/uL (ref 0.1–1.0)
Monocytes Relative: 14 %
Neutro Abs: 1.2 10*3/uL — ABNORMAL LOW (ref 1.7–7.7)
Neutrophils Relative %: 49 %
Platelets: 217 10*3/uL (ref 150–400)
RBC: 2.77 MIL/uL — ABNORMAL LOW (ref 3.87–5.11)
RDW: 18.6 % — ABNORMAL HIGH (ref 11.5–15.5)
WBC: 2.4 10*3/uL — ABNORMAL LOW (ref 4.0–10.5)
nRBC: 3 % — ABNORMAL HIGH (ref 0.0–0.2)

## 2020-09-17 LAB — COMPREHENSIVE METABOLIC PANEL
ALT: 29 U/L (ref 0–44)
AST: 45 U/L — ABNORMAL HIGH (ref 15–41)
Albumin: 3.2 g/dL — ABNORMAL LOW (ref 3.5–5.0)
Alkaline Phosphatase: 78 U/L (ref 38–126)
Anion gap: 12 (ref 5–15)
BUN: 12 mg/dL (ref 8–23)
CO2: 20 mmol/L — ABNORMAL LOW (ref 22–32)
Calcium: 9.3 mg/dL (ref 8.9–10.3)
Chloride: 103 mmol/L (ref 98–111)
Creatinine, Ser: 1.35 mg/dL — ABNORMAL HIGH (ref 0.44–1.00)
GFR, Estimated: 38 mL/min — ABNORMAL LOW (ref >60)
Glucose, Bld: 229 mg/dL — ABNORMAL HIGH (ref 70–99)
Potassium: 3.7 mmol/L (ref 3.5–5.1)
Sodium: 135 mmol/L (ref 135–145)
Total Bilirubin: 0.9 mg/dL (ref 0.3–1.2)
Total Protein: 6.4 g/dL — ABNORMAL LOW (ref 6.5–8.1)

## 2020-09-17 MED ORDER — HEPARIN SOD (PORK) LOCK FLUSH 100 UNIT/ML IV SOLN
INTRAVENOUS | Status: AC
  Filled 2020-09-17: qty 5

## 2020-09-17 MED ORDER — SODIUM CHLORIDE 0.9 % IV SOLN
Freq: Once | INTRAVENOUS | Status: AC
  Filled 2020-09-17: qty 250

## 2020-09-17 MED ORDER — HEPARIN SOD (PORK) LOCK FLUSH 100 UNIT/ML IV SOLN
500.0000 [IU] | Freq: Once | INTRAVENOUS | Status: AC
  Administered 2020-09-17: 500 [IU] via INTRAVENOUS
  Filled 2020-09-17: qty 5

## 2020-09-17 MED ORDER — SODIUM CHLORIDE 0.9 % IV SOLN
1800.0000 mg | Freq: Once | INTRAVENOUS | Status: AC
  Administered 2020-09-17: 1800 mg via INTRAVENOUS
  Filled 2020-09-17: qty 26.3

## 2020-09-17 MED ORDER — PROCHLORPERAZINE MALEATE 10 MG PO TABS
10.0000 mg | ORAL_TABLET | Freq: Once | ORAL | Status: AC
  Administered 2020-09-17: 10 mg via ORAL
  Filled 2020-09-17: qty 1

## 2020-09-17 MED ORDER — LIDOCAINE-PRILOCAINE 2.5-2.5 % EX CREA: TOPICAL_CREAM | CUTANEOUS | 3 refills | Status: AC

## 2020-09-17 MED ORDER — SODIUM CHLORIDE 0.9% FLUSH
10.0000 mL | INTRAVENOUS | Status: DC | PRN
  Administered 2020-09-17: 10 mL via INTRAVENOUS
  Filled 2020-09-17: qty 10

## 2020-09-24 ENCOUNTER — Other Ambulatory Visit: Payer: Medicare Other

## 2020-09-24 ENCOUNTER — Ambulatory Visit
Admission: RE | Admit: 2020-09-24 | Discharge: 2020-09-24 | Disposition: A | Discharge status: Home / Self Care | Payer: Medicare Other | Source: Ambulatory Visit | Attending: Oncology | Admitting: Oncology

## 2020-09-24 ENCOUNTER — Ambulatory Visit: Payer: Medicare Other

## 2020-09-24 ENCOUNTER — Ambulatory Visit: Payer: Medicare Other | Admitting: Oncology

## 2020-09-24 ENCOUNTER — Other Ambulatory Visit: Payer: Self-pay

## 2020-09-24 DIAGNOSIS — R188 Other ascites: Secondary | ICD-10-CM | POA: Diagnosis not present

## 2020-09-24 DIAGNOSIS — C259 Malignant neoplasm of pancreas, unspecified: Secondary | ICD-10-CM | POA: Diagnosis not present

## 2020-09-24 LAB — GLUCOSE, CAPILLARY: Glucose-Capillary: 111 mg/dL — ABNORMAL HIGH (ref 70–99)

## 2020-09-24 MED ORDER — FLUDEOXYGLUCOSE F - 18 (FDG) INJECTION
7.5000 | Freq: Once | INTRAVENOUS | Status: AC | PRN
  Administered 2020-09-24: 8.05 via INTRAVENOUS

## 2020-09-25 ENCOUNTER — Ambulatory Visit: Payer: Medicare Other

## 2020-09-25 ENCOUNTER — Other Ambulatory Visit: Payer: Medicare Other

## 2020-09-25 ENCOUNTER — Ambulatory Visit: Payer: Medicare Other | Admitting: Oncology

## 2020-09-26 NOTE — Progress Notes (Signed)
[facility]  Telephone:([phone] [phone]  ID: Veronica Colon OB: [birth year]  MR#: [id number]  TAV#:[id number]  Patient Care Team: Delsa Grana, PA-C as PCP - General (Family Medicine) Marlowe Sax, MD as Referring Physician (Internal Medicine) Birder Robson, MD as Referring Physician (Ophthalmology) Lloyd Huger, MD as Consulting Physician (Hematology and Oncology)  CHIEF COMPLAINT: Stage IV pancreatic cancer.  INTERVAL HISTORY: Patient returns to clinic today for further evaluation, discussion of her PET scan results, and consideration of cycle 7, day 1 of single agent gemcitabine.  She continues to have mild abdominal discomfort which is worse at night.  She continues to have chronic weakness and fatigue.  She has a fair appetite.  Her constipation is better controlled.  She has no neurologic complaints.  She denies any recent fevers or illnesses.  She denies any other pain.  She has no chest pain, shortness of breath, cough, or hemoptysis.  She denies any nausea, vomiting, or diarrhea.  She has noted no changes in her bowel movements and denies any melena or hematochezia.  She has no urinary complaints.  Patient offers no further specific complaints today.  REVIEW OF SYSTEMS:   Review of Systems  Constitutional: Positive for malaise/fatigue. Negative for fever and weight loss.  Respiratory: Negative.  Negative for cough and hemoptysis.   Cardiovascular: Negative.  Negative for chest pain and leg swelling.  Gastrointestinal: Positive for abdominal pain. Negative for blood in stool, constipation and melena.  Genitourinary: Negative.  Negative for dysuria.  Musculoskeletal: Negative.  Negative for back pain.  Skin: Negative.  Negative for rash.  Neurological: Positive for weakness. Negative for dizziness, focal weakness and headaches.  Psychiatric/Behavioral: Negative.  The patient is not nervous/anxious.     As per HPI. Otherwise, a  complete review of systems is negative.  PAST MEDICAL HISTORY: Past Medical History:  Diagnosis Date  . Allergy   . Anxiety and depression   . Anxiety and depression   . Arthritis   . Breast cancer ([place]) 1990   LT MASTECTOMY  . Cancer ([place]) 1990   LT BREAST MASTECTOMY  . CKD (chronic kidney disease), stage III ([place])   . Diabetes mellitus without complication ([place])   . Family history of breast cancer   . Family history of pancreatic cancer   . GERD (gastroesophageal reflux disease)   . Hypertension   . Hypertensive CKD (chronic kidney disease)   . Personal history of chemotherapy   . Status post chemotherapy    breast cancer  . Temporal arteritis ([place]) 10/28/2016   Overview:  Right temporal arteritis  Biopsy proven 11/08/2016    PAST SURGICAL HISTORY: Past Surgical History:  Procedure Laterality Date  . ABDOMINAL HYSTERECTOMY    . BREAST SURGERY    . COLONOSCOPY WITH PROPOFOL N/A 06/04/2015   Procedure: COLONOSCOPY WITH PROPOFOL;  Surgeon: Manya Silvas, MD;  Location: [facility] ENDOSCOPY;  Service: Endoscopy;  Laterality: N/A;  . IR IMAGING GUIDED PORT INSERTION  05/09/2020  . MASTECTOMY Left 1990   BREAST CA  . TEMPORAL ARTERY BIOPSY / LIGATION  11/2016    FAMILY HISTORY: Family History  Problem Relation Age of Onset  . Breast cancer Mother 11  . Lung cancer Father        lung  . Cancer Maternal Aunt        unk types  . Cancer Maternal Uncle        unk types  . Pancreatic cancer Cousin     ADVANCED  DIRECTIVES (Y/N):  N  HEALTH MAINTENANCE: Social History   Tobacco Use  . Smoking status: Never Smoker  . Smokeless tobacco: Never Used  Vaping Use  . Vaping Use: Never used  Substance Use Topics  . Alcohol use: No    Alcohol/week: 0.0 standard drinks  . Drug use: No     Colonoscopy:  PAP:  Bone density:  Lipid panel:  Allergies  Allergen Reactions  . Hydrochlorothiazide Other (See Comments)    Hypokalemia   . Lisinopril Other (See Comments) and  Cough    Hands numb   . Metformin And Related     Fatigue, "heart was beating slower"    Current Outpatient Medications  Medication Sig Dispense Refill  . acetaminophen (TYLENOL) 325 MG tablet Take 650 mg by mouth every 6 (six) hours as needed for mild pain.    Marland Kitchen ascorbic acid (VITAMIN C) 1000 MG tablet Take 1,000 mg by mouth 3 (three) times a week.    Marland Kitchen aspirin 81 MG tablet Take 81 mg by mouth 3 (three) times a week.    . fluticasone (FLONASE) 50 MCG/ACT nasal spray Place 2 sprays into both nostrils daily. 48 g 3  . HYDROcodone-acetaminophen (NORCO/VICODIN) 5-325 MG tablet Take 1 tablet by mouth every 6 (six) hours as needed for moderate pain. 30 tablet 0  . lidocaine-prilocaine (EMLA) cream Apply to port 1 hour prior to appointment. Cover with plastic wrap. 30 g 3  . Multiple Vitamin (MULTIVITAMIN) tablet Take 1 tablet by mouth daily.    Dellia Nims CALCIUM + D3 500-200 MG-UNIT TABS Take 1 tablet by mouth 2 (two) times daily with a meal.  11  . prochlorperazine (COMPAZINE) 10 MG tablet Take 1 tablet (10 mg total) by mouth every 6 (six) hours as needed (Nausea or vomiting). 60 tablet 3  . spironolactone (ALDACTONE) 50 MG tablet TAKE 1 TABLET BY MOUTH EVERY DAY 90 tablet 3   No current facility-administered medications for this visit.   Facility-Administered Medications Ordered in Other Visits  Medication Dose Route Frequency Provider Last Rate Last Admin  . 0.9 %  sodium chloride infusion   Intravenous Once Lloyd Huger, MD      . 0.9 %  sodium chloride infusion   Intravenous Once Lloyd Huger, MD      . gemcitabine (GEMZAR) 1,800 mg in sodium chloride 0.9 % 250 mL chemo infusion  1,800 mg Intravenous Once Lloyd Huger, MD      . prochlorperazine (COMPAZINE) tablet 10 mg  10 mg Oral Once Lloyd Huger, MD        OBJECTIVE: Vitals:   10/01/20 0933  BP: 129/77  Pulse: 84  Temp: (!) 97.1 F (36.2 C)  SpO2: 100%     Body mass index is 24.44 kg/m.    ECOG  FS:2 - Symptomatic, <50% confined to bed  General: Thin, no acute distress.  Sitting in a wheelchair. Eyes: Pink conjunctiva, anicteric sclera. HEENT: Normocephalic, moist mucous membranes. Lungs: No audible wheezing or coughing. Heart: Regular rate and rhythm. Abdomen: Soft, nontender, no obvious distention. Musculoskeletal: No edema, cyanosis, or clubbing. Neuro: Alert, answering all questions appropriately. Cranial nerves grossly intact. Skin: No rashes or petechiae noted. Psych: Normal affect.   LAB RESULTS:  Lab Results  Component Value Date   NA 132 (L) 10/01/2020   K 3.6 10/01/2020   CL 101 10/01/2020   CO2 20 (L) 10/01/2020   GLUCOSE 151 (H) 10/01/2020   BUN 17 10/01/2020  CREATININE 1.41 (H) 10/01/2020   CALCIUM 9.0 10/01/2020   PROT 6.1 (L) 10/01/2020   ALBUMIN 3.1 (L) 10/01/2020   AST 40 10/01/2020   ALT 25 10/01/2020   ALKPHOS 151 (H) 10/01/2020   BILITOT 1.2 10/01/2020   GFRNONAA 36 (L) 10/01/2020   GFRAA 36 (L) 04/11/2020    Lab Results  Component Value Date   WBC 5.9 10/01/2020   NEUTROABS 4.5 10/01/2020   HGB 8.4 (L) 10/01/2020   HCT 25.6 (L) 10/01/2020   MCV 94.5 10/01/2020   PLT 308 10/01/2020     STUDIES: NM PET Image Restag (PS) Skull Base To Thigh  Result Date: 09/24/2020 CLINICAL DATA:  Aortic Atherosclerois (ICD10-170.0) treatment strategy for pancreatic cancer. EXAM: NUCLEAR MEDICINE PET SKULL BASE TO THIGH TECHNIQUE: 8.1 mCi F-18 FDG was injected intravenously. Full-ring PET imaging was performed from the skull base to thigh after the radiotracer. CT data was obtained and used for attenuation correction and anatomic localization. Fasting blood glucose: 111 mg/dl COMPARISON:  04/17/2020. FINDINGS: Mediastinal blood pool activity: SUV max 3.1 Liver activity: SUV max NA NECK: No hypermetabolic lymph nodes in the neck. Incidental CT findings: none CHEST: No hypermetabolic mediastinal or hilar nodes. No suspicious pulmonary nodules on the CT  scan. Incidental CT findings: Coronary artery calcification is evident. Atherosclerotic calcification is noted in the wall of the thoracic aorta. Right Port-A-Cath tip is positioned at the SVC/RA junction. ABDOMEN/PELVIS: Hypermetabolism seen in the pancreatic lesion previously has decreased slightly in the interval with SUV max = 5.2 today compared to 7.7 previously. The left para-aortic retroperitoneal lesion measures 1.7 x 1.6 cm today and shows low level hypermetabolism with SUV max = 3.8. This is lesion that measured 2.6 x 1.3 cm on previous PET-CT with SUV max = 6.6 and represents the site of left ureteral obstruction. Omental disease seen previously has decreased on CT imaging. Low level FDG accumulation identified in a 2.5 cm left paramidline omental lesion (165/3) with SUV max = 4.0 1.7 cm small midline omental lesion just deep to the umbilicus on 841/6 demonstrates SUV max = 3.8. Incidental CT findings: Left hydronephrosis is similar to prior. Moderate volume ascites is new in the interval. There is abdominal aortic atherosclerosis without aneurysm. Gallbladder is distended SKELETON: No focal hypermetabolic activity to suggest skeletal metastasis. Incidental CT findings: No worrisome lytic or sclerotic osseous abnormality. IMPRESSION: 1. Interval slight decrease in hypermetabolism associated with the patient's known pancreatic lesion. Main duct dilatation in the tail of pancreas is similar to prior. 2. Hypermetabolic left retroperitoneal/para-aortic lesion measures slightly smaller today with associated interval decrease in hypermetabolic FDG accumulation. This lesion obstructs the left ureter with similar appearance of left hydroureteronephrosis. 3. Omental disease identified on the previous study has decreased in the interval but persists. 4. Despite the generalized trend towards improvement on today's study, there is new moderate volume ascites in the abdomen and pelvis. Electronically Signed   By: Misty Stanley M.D.   On: 09/24/2020 15:40    ASSESSMENT: Stage IV pancreatic cancer.  PLAN:   1.  Stage IV pancreatic cancer: Omental biopsy confirmed results.  PET scan results from September 24, 2020 reviewed independently and reported as above with improvement of disease burden, but with residual disease.  Patient's CA-19 9 was initially significantly elevated at 19,661 and trended down to 10,124.  Despite an overall improvement of PET scan, CA 19-9 is trending back up to 15,703.  Today's result is pending.  We once again discussed the possibility of  adding Abraxane, but given patient's decreased performance status and weakness and fatigue she does not wish to add any further treatments at this time.  We will add 1 L of fluids with each treatment.  Proceed with cycle 7, day 1 today.  Return to clinic in 1 week for further evaluation and consideration of cycle 7, day 8.   2.  MGUS: Patient's most recent M spike is only 0.2.  This is likely clinically insignificant.  No further work-up is necessary. 3.  History of colon cancer: Patient had stage I colon cancer 8+ years ago 4.  History of breast cancer: 31 years ago.  Unclear stage or type of malignancy. 5.  Renal insufficiency: Chronic and unchanged.  Patient's creatinine is 1.41 today.  Possibly secondary to obstruction from omental caking.  Patient noted to have mild left hydronephrosis.  If creatinine gets worse, can consider nephrostomy tube placement. 6.  Leukopenia: Resolved. 7.  Anemia: Chronic and unchanged.  Patient's hemoglobin is 8.4.  Can consider transfusion if falls below 8.0. 8.  Thrombocytopenia: Resolved.  Will only give treatment on day 1 and 8. 9.  Weight loss: Patient's weight has stabilized.  Appreciate dietary input. 10.  Hyponatremia: Mild, monitor.  Patient expressed understanding and was in agreement with this plan. She also understands that She can call clinic at any time with any questions, concerns, or complaints.    Lloyd Huger, MD   10/01/2020 10:33 AM

## 2020-10-01 ENCOUNTER — Inpatient Hospital Stay: Payer: Medicare Other | Admitting: Oncology

## 2020-10-01 ENCOUNTER — Inpatient Hospital Stay: Payer: Medicare Other

## 2020-10-01 ENCOUNTER — Encounter: Payer: Self-pay | Admitting: Oncology

## 2020-10-01 VITALS — Resp 20

## 2020-10-01 VITALS — BP 129/77 | HR 84 | Temp 97.1°F | Wt 142.4 lb

## 2020-10-01 DIAGNOSIS — C259 Malignant neoplasm of pancreas, unspecified: Secondary | ICD-10-CM | POA: Diagnosis not present

## 2020-10-01 LAB — COMPREHENSIVE METABOLIC PANEL WITH GFR
ALT: 25 U/L (ref 0–44)
AST: 40 U/L (ref 15–41)
Albumin: 3.1 g/dL — ABNORMAL LOW (ref 3.5–5.0)
Alkaline Phosphatase: 151 U/L — ABNORMAL HIGH (ref 38–126)
Anion gap: 11 (ref 5–15)
BUN: 17 mg/dL (ref 8–23)
CO2: 20 mmol/L — ABNORMAL LOW (ref 22–32)
Calcium: 9 mg/dL (ref 8.9–10.3)
Chloride: 101 mmol/L (ref 98–111)
Creatinine, Ser: 1.41 mg/dL — ABNORMAL HIGH (ref 0.44–1.00)
GFR, Estimated: 36 mL/min — ABNORMAL LOW
Glucose, Bld: 151 mg/dL — ABNORMAL HIGH (ref 70–99)
Potassium: 3.6 mmol/L (ref 3.5–5.1)
Sodium: 132 mmol/L — ABNORMAL LOW (ref 135–145)
Total Bilirubin: 1.2 mg/dL (ref 0.3–1.2)
Total Protein: 6.1 g/dL — ABNORMAL LOW (ref 6.5–8.1)

## 2020-10-01 LAB — CBC WITH DIFFERENTIAL/PLATELET
Abs Immature Granulocytes: 0.04 K/uL (ref 0.00–0.07)
Basophils Absolute: 0 K/uL (ref 0.0–0.1)
Basophils Relative: 0 %
Eosinophils Absolute: 0 K/uL (ref 0.0–0.5)
Eosinophils Relative: 0 %
HCT: 25.6 % — ABNORMAL LOW (ref 36.0–46.0)
Hemoglobin: 8.4 g/dL — ABNORMAL LOW (ref 12.0–15.0)
Immature Granulocytes: 1 %
Lymphocytes Relative: 8 %
Lymphs Abs: 0.5 K/uL — ABNORMAL LOW (ref 0.7–4.0)
MCH: 31 pg (ref 26.0–34.0)
MCHC: 32.8 g/dL (ref 30.0–36.0)
MCV: 94.5 fL (ref 80.0–100.0)
Monocytes Absolute: 0.9 K/uL (ref 0.1–1.0)
Monocytes Relative: 15 %
Neutro Abs: 4.5 K/uL (ref 1.7–7.7)
Neutrophils Relative %: 76 %
Platelets: 308 K/uL (ref 150–400)
RBC: 2.71 MIL/uL — ABNORMAL LOW (ref 3.87–5.11)
RDW: 20 % — ABNORMAL HIGH (ref 11.5–15.5)
WBC: 5.9 K/uL (ref 4.0–10.5)
nRBC: 0.5 % — ABNORMAL HIGH (ref 0.0–0.2)

## 2020-10-01 MED ORDER — HEPARIN SOD (PORK) LOCK FLUSH 100 UNIT/ML IV SOLN
INTRAVENOUS | Status: AC
  Filled 2020-10-01: qty 5

## 2020-10-01 MED ORDER — SODIUM CHLORIDE 0.9% FLUSH
10.0000 mL | Freq: Once | INTRAVENOUS | Status: AC
  Administered 2020-10-01: 10 mL via INTRAVENOUS
  Filled 2020-10-01: qty 10

## 2020-10-01 MED ORDER — SODIUM CHLORIDE 0.9 % IV SOLN
1800.0000 mg | Freq: Once | INTRAVENOUS | Status: AC
  Administered 2020-10-01: 1800 mg via INTRAVENOUS
  Filled 2020-10-01: qty 26.3

## 2020-10-01 MED ORDER — SODIUM CHLORIDE 0.9 % IV SOLN
Freq: Once | INTRAVENOUS | Status: AC
  Filled 2020-10-01: qty 250

## 2020-10-01 MED ORDER — HEPARIN SOD (PORK) LOCK FLUSH 100 UNIT/ML IV SOLN
500.0000 [IU] | Freq: Once | INTRAVENOUS | Status: AC | PRN
  Administered 2020-10-01: 500 [IU]
  Filled 2020-10-01: qty 5

## 2020-10-01 MED ORDER — PROCHLORPERAZINE MALEATE 10 MG PO TABS
10.0000 mg | ORAL_TABLET | Freq: Once | ORAL | Status: AC
  Administered 2020-10-01: 10 mg via ORAL
  Filled 2020-10-01: qty 1

## 2020-10-01 MED ORDER — SODIUM CHLORIDE 0.9 % IV SOLN
Freq: Once | INTRAVENOUS | Status: AC
  Administered 2020-10-01: 1000 mL via INTRAVENOUS
  Filled 2020-10-01: qty 250

## 2020-10-02 ENCOUNTER — Inpatient Hospital Stay: Payer: Medicare Other

## 2020-10-02 ENCOUNTER — Telehealth: Payer: Self-pay

## 2020-10-02 LAB — CANCER ANTIGEN 19-9: CA 19-9: 9155 U/mL — ABNORMAL HIGH (ref 0–35)

## 2020-10-02 NOTE — Progress Notes (Signed)
Veronica Colon  [phone] [phone]) [phone]  ID: Careen Mauch Shatz OB: August 28, 1931  MR#: [id number]  TTS#:[id number]  Patient Care Team: Delsa Grana, PA-C as PCP - General (Family Medicine) Marlowe Sax, MD as Referring Physician (Internal Medicine) Birder Robson, MD as Referring Physician (Ophthalmology) Lloyd Huger, MD as Consulting Physician (Hematology and Oncology)  CHIEF COMPLAINT: Stage IV pancreatic cancer.  INTERVAL HISTORY: Patient returns to clinic today for further evaluation and consideration of cycle 7, day 8 of single agent gemcitabine.  She has an increase of bilateral lower extremities peripheral edema.  She continues to have chronic weakness and fatigue.  She does not complain of any abdominal discomfort today. She has a fair appetite.  Her constipation is better controlled.  She has no neurologic complaints.  She denies any recent fevers or illnesses.  She denies any other pain.  She has no chest pain, shortness of breath, cough, or hemoptysis.  She denies any nausea, vomiting, or diarrhea.  She has noted no changes in her bowel movements and denies any melena or hematochezia.  She has no urinary complaints.  Patient offers no further specific complaints today.  REVIEW OF SYSTEMS:   Review of Systems  Constitutional: Positive for malaise/fatigue. Negative for fever and weight loss.  Respiratory: Negative.  Negative for cough and hemoptysis.   Cardiovascular: Positive for leg swelling. Negative for chest pain.  Gastrointestinal: Negative.  Negative for abdominal pain, blood in stool, constipation and melena.  Genitourinary: Negative.  Negative for dysuria.  Musculoskeletal: Negative.  Negative for back pain.  Skin: Negative.  Negative for rash.  Neurological: Positive for weakness. Negative for dizziness, focal weakness and headaches.  Psychiatric/Behavioral: Negative.  The patient is not nervous/anxious.     As per HPI.  Otherwise, a complete review of systems is negative.  PAST MEDICAL HISTORY: Past Medical History:  Diagnosis Date  . Allergy   . Anxiety and depression   . Anxiety and depression   . Arthritis   . Breast cancer ([place]) 1990   LT MASTECTOMY  . Cancer ([place]) 1990   LT BREAST MASTECTOMY  . CKD (chronic kidney disease), stage III ([place])   . Diabetes mellitus without complication ([place])   . Family history of breast cancer   . Family history of pancreatic cancer   . GERD (gastroesophageal reflux disease)   . Hypertension   . Hypertensive CKD (chronic kidney disease)   . Personal history of chemotherapy   . Status post chemotherapy    breast cancer  . Temporal arteritis ([place]) 10/28/2016   Overview:  Right temporal arteritis  Biopsy proven 11/08/2016    PAST SURGICAL HISTORY: Past Surgical History:  Procedure Laterality Date  . ABDOMINAL HYSTERECTOMY    . BREAST SURGERY    . COLONOSCOPY WITH PROPOFOL N/A 06/04/2015   Procedure: COLONOSCOPY WITH PROPOFOL;  Surgeon: Manya Silvas, MD;  Location: [facility] ENDOSCOPY;  Service: Endoscopy;  Laterality: N/A;  . IR IMAGING GUIDED PORT INSERTION  05/09/2020  . MASTECTOMY Left 1990   BREAST CA  . TEMPORAL ARTERY BIOPSY / LIGATION  11/2016    FAMILY HISTORY: Family History  Problem Relation Age of Onset  . Breast cancer Mother 28  . Lung cancer Father        lung  . Cancer Maternal Aunt        unk types  . Cancer Maternal Uncle        unk types  . Pancreatic cancer Cousin     ADVANCED  DIRECTIVES (Y/N):  N  HEALTH MAINTENANCE: Social History   Tobacco Use  . Smoking status: Never Smoker  . Smokeless tobacco: Never Used  Vaping Use  . Vaping Use: Never used  Substance Use Topics  . Alcohol use: No    Alcohol/week: 0.0 standard drinks  . Drug use: No     Colonoscopy:  PAP:  Bone density:  Lipid panel:  Allergies  Allergen Reactions  . Hydrochlorothiazide Other (See Comments)    Hypokalemia   . Lisinopril Other (See  Comments) and Cough    Hands numb   . Metformin And Related     Fatigue, "heart was beating slower"    Current Outpatient Medications  Medication Sig Dispense Refill  . acetaminophen (TYLENOL) 325 MG tablet Take 650 mg by mouth every 6 (six) hours as needed for mild pain.    Marland Kitchen ascorbic acid (VITAMIN C) 1000 MG tablet Take 1,000 mg by mouth 3 (three) times a week.    Marland Kitchen aspirin 81 MG tablet Take 81 mg by mouth 3 (three) times a week.    . fluticasone (FLONASE) 50 MCG/ACT nasal spray Place 2 sprays into both nostrils daily. 48 g 3  . HYDROcodone-acetaminophen (NORCO/VICODIN) 5-325 MG tablet Take 1 tablet by mouth every 6 (six) hours as needed for moderate pain. 30 tablet 0  . lidocaine-prilocaine (EMLA) cream Apply to port 1 hour prior to appointment. Cover with plastic wrap. 30 g 3  . Multiple Vitamin (MULTIVITAMIN) tablet Take 1 tablet by mouth daily.    Dellia Nims CALCIUM + D3 500-200 MG-UNIT TABS Take 1 tablet by mouth 2 (two) times daily with a meal.  11  . prochlorperazine (COMPAZINE) 10 MG tablet Take 1 tablet (10 mg total) by mouth every 6 (six) hours as needed (Nausea or vomiting). 60 tablet 3  . spironolactone (ALDACTONE) 50 MG tablet TAKE 1 TABLET BY MOUTH EVERY DAY 90 tablet 3   No current facility-administered medications for this visit.    OBJECTIVE: Vitals:   10/08/20 0842  BP: 126/71  Pulse: 83  Resp: 12  Temp: (!) 97.2 F (36.2 C)  SpO2: 100%     There is no height or weight on file to calculate BMI.    ECOG FS:2 - Symptomatic, <50% confined to bed  General: Thin, no acute distress.  Sitting in a wheelchair. Eyes: Pink conjunctiva, anicteric sclera. HEENT: Normocephalic, moist mucous membranes. Lungs: No audible wheezing or coughing. Heart: Regular rate and rhythm. Abdomen: Soft, nontender, no obvious distention. Musculoskeletal: 2-3+ bilateral lower extremity edema. Neuro: Alert, answering all questions appropriately. Cranial nerves grossly intact. Skin: No  rashes or petechiae noted. Psych: Normal affect.    LAB RESULTS:  Lab Results  Component Value Date   NA 130 (L) 10/08/2020   K 3.9 10/08/2020   CL 101 10/08/2020   CO2 20 (L) 10/08/2020   GLUCOSE 150 (H) 10/08/2020   BUN 26 (H) 10/08/2020   CREATININE 1.65 (H) 10/08/2020   CALCIUM 8.8 (L) 10/08/2020   PROT 5.9 (L) 10/08/2020   ALBUMIN 2.7 (L) 10/08/2020   AST 96 (H) 10/08/2020   ALT 68 (H) 10/08/2020   ALKPHOS 257 (H) 10/08/2020   BILITOT 1.6 (H) 10/08/2020   GFRNONAA 30 (L) 10/08/2020   GFRAA 36 (L) 04/11/2020    Lab Results  Component Value Date   WBC 3.7 (L) 10/08/2020   NEUTROABS 2.3 10/08/2020   HGB 7.9 (L) 10/08/2020   HCT 23.5 (L) 10/08/2020   MCV 92.2 10/08/2020  PLT 200 10/08/2020     STUDIES: NM PET Image Restag (PS) Skull Base To Thigh  Result Date: 09/24/2020 CLINICAL DATA:  Aortic Atherosclerois (ICD10-170.0) treatment strategy for pancreatic cancer. EXAM: NUCLEAR MEDICINE PET SKULL BASE TO THIGH TECHNIQUE: 8.1 mCi F-18 FDG was injected intravenously. Full-ring PET imaging was performed from the skull base to thigh after the radiotracer. CT data was obtained and used for attenuation correction and anatomic localization. Fasting blood glucose: 111 mg/dl COMPARISON:  04/17/2020. FINDINGS: Mediastinal blood pool activity: SUV max 3.1 Liver activity: SUV max NA NECK: No hypermetabolic lymph nodes in the neck. Incidental CT findings: none CHEST: No hypermetabolic mediastinal or hilar nodes. No suspicious pulmonary nodules on the CT scan. Incidental CT findings: Coronary artery calcification is evident. Atherosclerotic calcification is noted in the wall of the thoracic aorta. Right Port-A-Cath tip is positioned at the SVC/RA junction. ABDOMEN/PELVIS: Hypermetabolism seen in the pancreatic lesion previously has decreased slightly in the interval with SUV max = 5.2 today compared to 7.7 previously. The left para-aortic retroperitoneal lesion measures 1.7 x 1.6 cm today  and shows low level hypermetabolism with SUV max = 3.8. This is lesion that measured 2.6 x 1.3 cm on previous PET-CT with SUV max = 6.6 and represents the site of left ureteral obstruction. Omental disease seen previously has decreased on CT imaging. Low level FDG accumulation identified in a 2.5 cm left paramidline omental lesion (165/3) with SUV max = 4.0 1.7 cm small midline omental lesion just deep to the umbilicus on 259/5 demonstrates SUV max = 3.8. Incidental CT findings: Left hydronephrosis is similar to prior. Moderate volume ascites is new in the interval. There is abdominal aortic atherosclerosis without aneurysm. Gallbladder is distended SKELETON: No focal hypermetabolic activity to suggest skeletal metastasis. Incidental CT findings: No worrisome lytic or sclerotic osseous abnormality. IMPRESSION: 1. Interval slight decrease in hypermetabolism associated with the patient's known pancreatic lesion. Main duct dilatation in the tail of pancreas is similar to prior. 2. Hypermetabolic left retroperitoneal/para-aortic lesion measures slightly smaller today with associated interval decrease in hypermetabolic FDG accumulation. This lesion obstructs the left ureter with similar appearance of left hydroureteronephrosis. 3. Omental disease identified on the previous study has decreased in the interval but persists. 4. Despite the generalized trend towards improvement on today's study, there is new moderate volume ascites in the abdomen and pelvis. Electronically Signed   By: Misty Stanley M.D.   On: 09/24/2020 15:40    ASSESSMENT: Stage IV pancreatic cancer.  PLAN:   1.  Stage IV pancreatic cancer: Omental biopsy confirmed results.  PET scan results from September 24, 2020 reviewed independently and reported as above with improvement of disease burden, but with residual disease.  Patient's CA-19 9 was initially significantly elevated at 19,661 and now has trended back down to 9155.  Previously, we discussed the  possibility of adding Abraxane, but given patient's decreased performance status and weakness and fatigue she does not wish to add any further treatments at this time.  We will add 1 L of fluids with each treatment, but if she continues to be edematous will have to discontinue this.  Proceed with cycle 7, day 8 of treatment today.  Return to clinic tomorrow for blood transfusion and then in 1 week for further evaluation and consideration of cycle 7, day 15. 2.  MGUS: Patient's most recent M spike is only 0.2.  This is likely clinically insignificant.  No further work-up is necessary. 3.  History of colon cancer: Patient had  stage I colon cancer 8+ years ago 4.  History of breast cancer: 31 years ago.  Unclear stage or type of malignancy. 5.  Renal insufficiency: Slightly worse today.  Patient's creatinine is 1.65.  Possibly secondary to obstruction from omental caking.  Patient noted to have mild left hydronephrosis.  If creatinine gets worse, can consider nephrostomy tube placement. 6.  Leukopenia: Mild, monitor.  Proceed with treatment as above. 7.  Anemia: Patient's hemoglobin is trended down to 7.9.  Return to clinic tomorrow for 1 unit packed red blood cells. 8.  Thrombocytopenia: Resolved.  Will only give treatment on day 1 and 8. 9.  Weight loss: Patient's weight has stabilized.  Appreciate dietary input. 10.  Hyponatremia: Chronic and unchanged.  Patient expressed understanding and was in agreement with this plan. She also understands that She can call clinic at any time with any questions, concerns, or complaints.    Lloyd Huger, MD   10/08/2020 11:12 AM

## 2020-10-02 NOTE — Telephone Encounter (Signed)
Nutrition  Called daughter for nutrition follow-up.  No answer. Left message with call back.   Marshae Azam B. Zenia Resides, [place], [place] Registered Dietitian [phone] (mobile)

## 2020-10-06 ENCOUNTER — Ambulatory Visit: Payer: Medicare Other | Admitting: Family Medicine

## 2020-10-08 ENCOUNTER — Inpatient Hospital Stay (HOSPITAL_BASED_OUTPATIENT_CLINIC_OR_DEPARTMENT_OTHER): Payer: Medicare Other | Admitting: Oncology

## 2020-10-08 ENCOUNTER — Encounter: Payer: Self-pay | Admitting: Oncology

## 2020-10-08 ENCOUNTER — Other Ambulatory Visit: Payer: Self-pay | Admitting: *Deleted

## 2020-10-08 ENCOUNTER — Inpatient Hospital Stay: Payer: Medicare Other

## 2020-10-08 VITALS — BP 126/71 | HR 83 | Temp 97.2°F | Resp 12

## 2020-10-08 DIAGNOSIS — E860 Dehydration: Secondary | ICD-10-CM | POA: Diagnosis not present

## 2020-10-08 DIAGNOSIS — C259 Malignant neoplasm of pancreas, unspecified: Secondary | ICD-10-CM | POA: Diagnosis not present

## 2020-10-08 DIAGNOSIS — E871 Hypo-osmolality and hyponatremia: Secondary | ICD-10-CM | POA: Diagnosis not present

## 2020-10-08 LAB — COMPREHENSIVE METABOLIC PANEL
ALT: 68 U/L — ABNORMAL HIGH (ref 0–44)
AST: 96 U/L — ABNORMAL HIGH (ref 15–41)
Albumin: 2.7 g/dL — ABNORMAL LOW (ref 3.5–5.0)
Alkaline Phosphatase: 257 U/L — ABNORMAL HIGH (ref 38–126)
Anion gap: 9 (ref 5–15)
BUN: 26 mg/dL — ABNORMAL HIGH (ref 8–23)
CO2: 20 mmol/L — ABNORMAL LOW (ref 22–32)
Calcium: 8.8 mg/dL — ABNORMAL LOW (ref 8.9–10.3)
Chloride: 101 mmol/L (ref 98–111)
Creatinine, Ser: 1.65 mg/dL — ABNORMAL HIGH (ref 0.44–1.00)
GFR, Estimated: 30 mL/min — ABNORMAL LOW (ref >60)
Glucose, Bld: 150 mg/dL — ABNORMAL HIGH (ref 70–99)
Potassium: 3.9 mmol/L (ref 3.5–5.1)
Sodium: 130 mmol/L — ABNORMAL LOW (ref 135–145)
Total Bilirubin: 1.6 mg/dL — ABNORMAL HIGH (ref 0.3–1.2)
Total Protein: 5.9 g/dL — ABNORMAL LOW (ref 6.5–8.1)

## 2020-10-08 LAB — CBC WITH DIFFERENTIAL/PLATELET
Abs Immature Granulocytes: 0.09 10*3/uL — ABNORMAL HIGH (ref 0.00–0.07)
Basophils Absolute: 0 10*3/uL (ref 0.0–0.1)
Basophils Relative: 1 %
Eosinophils Absolute: 0 10*3/uL (ref 0.0–0.5)
Eosinophils Relative: 1 %
HCT: 23.5 % — ABNORMAL LOW (ref 36.0–46.0)
Hemoglobin: 7.9 g/dL — ABNORMAL LOW (ref 12.0–15.0)
Immature Granulocytes: 2 %
Lymphocytes Relative: 19 %
Lymphs Abs: 0.7 10*3/uL (ref 0.7–4.0)
MCH: 31 pg (ref 26.0–34.0)
MCHC: 33.6 g/dL (ref 30.0–36.0)
MCV: 92.2 fL (ref 80.0–100.0)
Monocytes Absolute: 0.5 10*3/uL (ref 0.1–1.0)
Monocytes Relative: 15 %
Neutro Abs: 2.3 10*3/uL (ref 1.7–7.7)
Neutrophils Relative %: 62 %
Platelets: 200 10*3/uL (ref 150–400)
RBC: 2.55 MIL/uL — ABNORMAL LOW (ref 3.87–5.11)
RDW: 19.2 % — ABNORMAL HIGH (ref 11.5–15.5)
WBC: 3.7 10*3/uL — ABNORMAL LOW (ref 4.0–10.5)
nRBC: 2.7 % — ABNORMAL HIGH (ref 0.0–0.2)

## 2020-10-08 LAB — PREPARE RBC (CROSSMATCH)

## 2020-10-08 LAB — ABO/RH: ABO/RH(D): A POS

## 2020-10-08 MED ORDER — SODIUM CHLORIDE 0.9 % IV SOLN
1800.0000 mg | Freq: Once | INTRAVENOUS | Status: AC
  Administered 2020-10-08: 1800 mg via INTRAVENOUS
  Filled 2020-10-08: qty 26.3

## 2020-10-08 MED ORDER — HEPARIN SOD (PORK) LOCK FLUSH 100 UNIT/ML IV SOLN
INTRAVENOUS | Status: AC
  Filled 2020-10-08: qty 5

## 2020-10-08 MED ORDER — SODIUM CHLORIDE 0.9 % IV SOLN
Freq: Once | INTRAVENOUS | Status: AC
  Filled 2020-10-08: qty 250

## 2020-10-08 MED ORDER — HEPARIN SOD (PORK) LOCK FLUSH 100 UNIT/ML IV SOLN
500.0000 [IU] | Freq: Once | INTRAVENOUS | Status: AC
  Administered 2020-10-08: 500 [IU] via INTRAVENOUS
  Filled 2020-10-08: qty 5

## 2020-10-08 MED ORDER — PROCHLORPERAZINE MALEATE 10 MG PO TABS
10.0000 mg | ORAL_TABLET | Freq: Once | ORAL | Status: AC
  Administered 2020-10-08: 10 mg via ORAL
  Filled 2020-10-08: qty 1

## 2020-10-08 MED ORDER — SODIUM CHLORIDE 0.9% FLUSH
10.0000 mL | Freq: Once | INTRAVENOUS | Status: AC
  Administered 2020-10-08: 10 mL via INTRAVENOUS
  Filled 2020-10-08: qty 10

## 2020-10-08 NOTE — Progress Notes (Signed)
Patient here for pre treatment check, daughter reports that she has been extremely tired, and swelling of feet.

## 2020-10-08 NOTE — Progress Notes (Signed)
Ok to proceed with tx per MD, labs reviewed.

## 2020-10-09 ENCOUNTER — Inpatient Hospital Stay: Payer: Medicare Other

## 2020-10-09 DIAGNOSIS — C259 Malignant neoplasm of pancreas, unspecified: Secondary | ICD-10-CM

## 2020-10-09 MED ORDER — SODIUM CHLORIDE 0.9% IV SOLUTION
250.0000 mL | Freq: Once | INTRAVENOUS | Status: AC
  Administered 2020-10-09: 250 mL via INTRAVENOUS
  Filled 2020-10-09: qty 250

## 2020-10-09 MED ORDER — DIPHENHYDRAMINE HCL 50 MG/ML IJ SOLN
25.0000 mg | Freq: Once | INTRAMUSCULAR | Status: AC
  Administered 2020-10-09: 25 mg via INTRAVENOUS
  Filled 2020-10-09: qty 1

## 2020-10-09 MED ORDER — FUROSEMIDE 10 MG/ML IJ SOLN
20.0000 mg | Freq: Once | INTRAMUSCULAR | Status: AC
  Administered 2020-10-09: 20 mg via INTRAVENOUS
  Filled 2020-10-09: qty 2

## 2020-10-09 MED ORDER — SODIUM CHLORIDE 0.9% FLUSH
10.0000 mL | INTRAVENOUS | Status: DC | PRN
  Administered 2020-10-09: 10 mL via INTRAVENOUS
  Filled 2020-10-09: qty 10

## 2020-10-09 MED ORDER — ACETAMINOPHEN 325 MG PO TABS
650.0000 mg | ORAL_TABLET | Freq: Once | ORAL | Status: AC
  Administered 2020-10-09: 650 mg via ORAL
  Filled 2020-10-09: qty 2

## 2020-10-09 MED ORDER — HEPARIN SOD (PORK) LOCK FLUSH 100 UNIT/ML IV SOLN
500.0000 [IU] | Freq: Every day | INTRAVENOUS | Status: AC | PRN
  Administered 2020-10-09: 500 [IU]
  Filled 2020-10-09: qty 5

## 2020-10-09 MED ORDER — HEPARIN SOD (PORK) LOCK FLUSH 100 UNIT/ML IV SOLN
INTRAVENOUS | Status: AC
  Filled 2020-10-09: qty 5

## 2020-10-09 MED ORDER — SODIUM CHLORIDE 0.9% FLUSH
10.0000 mL | INTRAVENOUS | Status: AC | PRN
  Administered 2020-10-09: 10 mL
  Filled 2020-10-09: qty 10

## 2020-10-10 ENCOUNTER — Other Ambulatory Visit: Payer: Self-pay

## 2020-10-10 ENCOUNTER — Emergency Department: Payer: Medicare Other

## 2020-10-10 ENCOUNTER — Inpatient Hospital Stay
Admission: EM | Admit: 2020-10-10 | Discharge: 2020-10-22 | DRG: 640 | Disposition: A | Discharge status: Hospice | Payer: Medicare Other | Attending: Family Medicine | Admitting: Family Medicine

## 2020-10-10 ENCOUNTER — Encounter: Payer: Self-pay | Admitting: Emergency Medicine

## 2020-10-10 ENCOUNTER — Telehealth: Payer: Self-pay | Admitting: *Deleted

## 2020-10-10 DIAGNOSIS — E1169 Type 2 diabetes mellitus with other specified complication: Secondary | ICD-10-CM | POA: Diagnosis present

## 2020-10-10 DIAGNOSIS — Z9012 Acquired absence of left breast and nipple: Secondary | ICD-10-CM

## 2020-10-10 DIAGNOSIS — C250 Malignant neoplasm of head of pancreas: Secondary | ICD-10-CM

## 2020-10-10 DIAGNOSIS — D472 Monoclonal gammopathy: Secondary | ICD-10-CM | POA: Diagnosis present

## 2020-10-10 DIAGNOSIS — D61810 Antineoplastic chemotherapy induced pancytopenia: Secondary | ICD-10-CM | POA: Diagnosis present

## 2020-10-10 DIAGNOSIS — I248 Other forms of acute ischemic heart disease: Secondary | ICD-10-CM | POA: Diagnosis present

## 2020-10-10 DIAGNOSIS — Z7982 Long term (current) use of aspirin: Secondary | ICD-10-CM

## 2020-10-10 DIAGNOSIS — Z66 Do not resuscitate: Secondary | ICD-10-CM | POA: Diagnosis present

## 2020-10-10 DIAGNOSIS — I129 Hypertensive chronic kidney disease with stage 1 through stage 4 chronic kidney disease, or unspecified chronic kidney disease: Secondary | ICD-10-CM | POA: Diagnosis present

## 2020-10-10 DIAGNOSIS — D6481 Anemia due to antineoplastic chemotherapy: Secondary | ICD-10-CM | POA: Diagnosis not present

## 2020-10-10 DIAGNOSIS — B370 Candidal stomatitis: Secondary | ICD-10-CM | POA: Diagnosis present

## 2020-10-10 DIAGNOSIS — C799 Secondary malignant neoplasm of unspecified site: Secondary | ICD-10-CM | POA: Diagnosis not present

## 2020-10-10 DIAGNOSIS — N183 Chronic kidney disease, stage 3 unspecified: Secondary | ICD-10-CM | POA: Diagnosis present

## 2020-10-10 DIAGNOSIS — T451X5A Adverse effect of antineoplastic and immunosuppressive drugs, initial encounter: Secondary | ICD-10-CM | POA: Diagnosis present

## 2020-10-10 DIAGNOSIS — D696 Thrombocytopenia, unspecified: Secondary | ICD-10-CM | POA: Diagnosis not present

## 2020-10-10 DIAGNOSIS — E669 Obesity, unspecified: Secondary | ICD-10-CM | POA: Diagnosis present

## 2020-10-10 DIAGNOSIS — R54 Age-related physical debility: Secondary | ICD-10-CM | POA: Diagnosis present

## 2020-10-10 DIAGNOSIS — Z803 Family history of malignant neoplasm of breast: Secondary | ICD-10-CM

## 2020-10-10 DIAGNOSIS — K5900 Constipation, unspecified: Secondary | ICD-10-CM | POA: Diagnosis present

## 2020-10-10 DIAGNOSIS — E861 Hypovolemia: Secondary | ICD-10-CM | POA: Diagnosis present

## 2020-10-10 DIAGNOSIS — Z853 Personal history of malignant neoplasm of breast: Secondary | ICD-10-CM | POA: Diagnosis not present

## 2020-10-10 DIAGNOSIS — Z79899 Other long term (current) drug therapy: Secondary | ICD-10-CM

## 2020-10-10 DIAGNOSIS — E1122 Type 2 diabetes mellitus with diabetic chronic kidney disease: Secondary | ICD-10-CM | POA: Diagnosis present

## 2020-10-10 DIAGNOSIS — F32A Depression, unspecified: Secondary | ICD-10-CM | POA: Diagnosis present

## 2020-10-10 DIAGNOSIS — N1832 Chronic kidney disease, stage 3b: Secondary | ICD-10-CM | POA: Diagnosis present

## 2020-10-10 DIAGNOSIS — D6959 Other secondary thrombocytopenia: Secondary | ICD-10-CM | POA: Diagnosis present

## 2020-10-10 DIAGNOSIS — R188 Other ascites: Secondary | ICD-10-CM | POA: Diagnosis present

## 2020-10-10 DIAGNOSIS — E871 Hypo-osmolality and hyponatremia: Principal | ICD-10-CM | POA: Diagnosis present

## 2020-10-10 DIAGNOSIS — R531 Weakness: Secondary | ICD-10-CM | POA: Diagnosis not present

## 2020-10-10 DIAGNOSIS — R7301 Impaired fasting glucose: Secondary | ICD-10-CM

## 2020-10-10 DIAGNOSIS — E860 Dehydration: Secondary | ICD-10-CM | POA: Diagnosis present

## 2020-10-10 DIAGNOSIS — I959 Hypotension, unspecified: Secondary | ICD-10-CM | POA: Diagnosis present

## 2020-10-10 DIAGNOSIS — D72819 Decreased white blood cell count, unspecified: Secondary | ICD-10-CM | POA: Diagnosis not present

## 2020-10-10 DIAGNOSIS — M316 Other giant cell arteritis: Secondary | ICD-10-CM | POA: Diagnosis present

## 2020-10-10 DIAGNOSIS — Z20822 Contact with and (suspected) exposure to covid-19: Secondary | ICD-10-CM | POA: Diagnosis present

## 2020-10-10 DIAGNOSIS — R5381 Other malaise: Secondary | ICD-10-CM

## 2020-10-10 DIAGNOSIS — C7889 Secondary malignant neoplasm of other digestive organs: Secondary | ICD-10-CM | POA: Diagnosis not present

## 2020-10-10 DIAGNOSIS — F419 Anxiety disorder, unspecified: Secondary | ICD-10-CM | POA: Diagnosis present

## 2020-10-10 DIAGNOSIS — N1330 Unspecified hydronephrosis: Secondary | ICD-10-CM | POA: Diagnosis present

## 2020-10-10 DIAGNOSIS — N179 Acute kidney failure, unspecified: Secondary | ICD-10-CM | POA: Diagnosis not present

## 2020-10-10 DIAGNOSIS — Z85038 Personal history of other malignant neoplasm of large intestine: Secondary | ICD-10-CM

## 2020-10-10 DIAGNOSIS — D61818 Other pancytopenia: Secondary | ICD-10-CM | POA: Diagnosis not present

## 2020-10-10 DIAGNOSIS — R627 Adult failure to thrive: Secondary | ICD-10-CM | POA: Diagnosis present

## 2020-10-10 DIAGNOSIS — Z888 Allergy status to other drugs, medicaments and biological substances status: Secondary | ICD-10-CM

## 2020-10-10 DIAGNOSIS — Z6828 Body mass index (BMI) 28.0-28.9, adult: Secondary | ICD-10-CM | POA: Diagnosis not present

## 2020-10-10 DIAGNOSIS — D649 Anemia, unspecified: Secondary | ICD-10-CM | POA: Diagnosis not present

## 2020-10-10 DIAGNOSIS — E440 Moderate protein-calorie malnutrition: Secondary | ICD-10-CM | POA: Diagnosis present

## 2020-10-10 DIAGNOSIS — C259 Malignant neoplasm of pancreas, unspecified: Secondary | ICD-10-CM | POA: Diagnosis present

## 2020-10-10 DIAGNOSIS — K219 Gastro-esophageal reflux disease without esophagitis: Secondary | ICD-10-CM | POA: Diagnosis present

## 2020-10-10 DIAGNOSIS — R262 Difficulty in walking, not elsewhere classified: Secondary | ICD-10-CM | POA: Diagnosis present

## 2020-10-10 DIAGNOSIS — Z515 Encounter for palliative care: Secondary | ICD-10-CM

## 2020-10-10 DIAGNOSIS — N189 Chronic kidney disease, unspecified: Secondary | ICD-10-CM | POA: Diagnosis not present

## 2020-10-10 DIAGNOSIS — Z801 Family history of malignant neoplasm of trachea, bronchus and lung: Secondary | ICD-10-CM

## 2020-10-10 DIAGNOSIS — C786 Secondary malignant neoplasm of retroperitoneum and peritoneum: Secondary | ICD-10-CM | POA: Diagnosis not present

## 2020-10-10 DIAGNOSIS — Z800 Family history of malignant neoplasm of digestive organs: Secondary | ICD-10-CM

## 2020-10-10 HISTORY — DX: Adverse effect of antineoplastic and immunosuppressive drugs, initial encounter: T45.1X5A

## 2020-10-10 HISTORY — DX: Anemia due to antineoplastic chemotherapy: D64.81

## 2020-10-10 LAB — COMPREHENSIVE METABOLIC PANEL
ALT: 42 U/L (ref 0–44)
AST: 45 U/L — ABNORMAL HIGH (ref 15–41)
Albumin: 2.5 g/dL — ABNORMAL LOW (ref 3.5–5.0)
Alkaline Phosphatase: 202 U/L — ABNORMAL HIGH (ref 38–126)
Anion gap: 9 (ref 5–15)
BUN: 40 mg/dL — ABNORMAL HIGH (ref 8–23)
CO2: 18 mmol/L — ABNORMAL LOW (ref 22–32)
Calcium: 8.5 mg/dL — ABNORMAL LOW (ref 8.9–10.3)
Chloride: 101 mmol/L (ref 98–111)
Creatinine, Ser: 1.72 mg/dL — ABNORMAL HIGH (ref 0.44–1.00)
GFR, Estimated: 28 mL/min — ABNORMAL LOW (ref >60)
Glucose, Bld: 124 mg/dL — ABNORMAL HIGH (ref 70–99)
Potassium: 4.3 mmol/L (ref 3.5–5.1)
Sodium: 128 mmol/L — ABNORMAL LOW (ref 135–145)
Total Bilirubin: 1.9 mg/dL — ABNORMAL HIGH (ref 0.3–1.2)
Total Protein: 5.5 g/dL — ABNORMAL LOW (ref 6.5–8.1)

## 2020-10-10 LAB — CBC WITH DIFFERENTIAL/PLATELET
Abs Immature Granulocytes: 0.04 10*3/uL (ref 0.00–0.07)
Basophils Absolute: 0 10*3/uL (ref 0.0–0.1)
Basophils Relative: 0 %
Eosinophils Absolute: 0 10*3/uL (ref 0.0–0.5)
Eosinophils Relative: 0 %
HCT: 25.9 % — ABNORMAL LOW (ref 36.0–46.0)
Hemoglobin: 8.9 g/dL — ABNORMAL LOW (ref 12.0–15.0)
Immature Granulocytes: 1 %
Lymphocytes Relative: 4 %
Lymphs Abs: 0.3 10*3/uL — ABNORMAL LOW (ref 0.7–4.0)
MCH: 30.8 pg (ref 26.0–34.0)
MCHC: 34.4 g/dL (ref 30.0–36.0)
MCV: 89.6 fL (ref 80.0–100.0)
Monocytes Absolute: 0.1 10*3/uL (ref 0.1–1.0)
Monocytes Relative: 1 %
Neutro Abs: 7 10*3/uL (ref 1.7–7.7)
Neutrophils Relative %: 94 %
Platelets: 146 10*3/uL — ABNORMAL LOW (ref 150–400)
RBC: 2.89 MIL/uL — ABNORMAL LOW (ref 3.87–5.11)
RDW: 18.2 % — ABNORMAL HIGH (ref 11.5–15.5)
Smear Review: NORMAL
WBC: 7.4 10*3/uL (ref 4.0–10.5)
nRBC: 0.9 % — ABNORMAL HIGH (ref 0.0–0.2)

## 2020-10-10 LAB — BPAM RBC
Blood Product Expiration Date: 202204272359
ISSUE DATE / TIME: 202203310926
Unit Type and Rh: 6200

## 2020-10-10 LAB — TYPE AND SCREEN
ABO/RH(D): A POS
Antibody Screen: NEGATIVE
Unit division: 0

## 2020-10-10 LAB — RESP PANEL BY RT-PCR (FLU A&B, COVID) ARPGX2
Influenza A by PCR: NEGATIVE
Influenza B by PCR: NEGATIVE
SARS Coronavirus 2 by RT PCR: NEGATIVE

## 2020-10-10 LAB — TROPONIN I (HIGH SENSITIVITY)
Troponin I (High Sensitivity): 23 ng/L — ABNORMAL HIGH (ref <18)
Troponin I (High Sensitivity): 20 ng/L — ABNORMAL HIGH (ref <18)

## 2020-10-10 LAB — LIPASE, BLOOD: Lipase: 24 U/L (ref 11–51)

## 2020-10-10 MED ORDER — TRAZODONE HCL 50 MG PO TABS: 25.0000 mg | ORAL_TABLET | Freq: Every evening | ORAL | Status: DC | PRN

## 2020-10-10 MED ORDER — ENOXAPARIN SODIUM 40 MG/0.4ML ~~LOC~~ SOLN: 40.0000 mg | SUBCUTANEOUS | Status: DC

## 2020-10-10 MED ORDER — SODIUM CHLORIDE 0.9 % IV SOLN: Freq: Once | INTRAVENOUS | Status: AC

## 2020-10-10 MED ORDER — ASCORBIC ACID 500 MG PO TABS
1000.0000 mg | ORAL_TABLET | ORAL | Status: DC
  Administered 2020-10-10 – 2020-10-22 (×6): 1000 mg via ORAL
  Filled 2020-10-10 (×5): qty 2

## 2020-10-10 MED ORDER — METHOCARBAMOL 1000 MG/10ML IJ SOLN
500.0000 mg | Freq: Four times a day (QID) | INTRAVENOUS | Status: DC | PRN
  Filled 2020-10-10: qty 5

## 2020-10-10 MED ORDER — ONDANSETRON HCL 4 MG PO TABS: 4.0000 mg | ORAL_TABLET | Freq: Four times a day (QID) | ORAL | Status: DC | PRN

## 2020-10-10 MED ORDER — ACETAMINOPHEN 650 MG RE SUPP: 650.0000 mg | Freq: Four times a day (QID) | RECTAL | Status: DC | PRN

## 2020-10-10 MED ORDER — ENOXAPARIN SODIUM 30 MG/0.3ML ~~LOC~~ SOLN
30.0000 mg | SUBCUTANEOUS | Status: DC
  Administered 2020-10-10 – 2020-10-13 (×4): 30 mg via SUBCUTANEOUS
  Filled 2020-10-10 (×4): qty 0.3

## 2020-10-10 MED ORDER — CALCIUM CARBONATE-VITAMIN D 500-200 MG-UNIT PO TABS
1.0000 | ORAL_TABLET | Freq: Every day | ORAL | Status: DC
  Administered 2020-10-10 – 2020-10-22 (×13): 1 via ORAL
  Filled 2020-10-10 (×13): qty 1

## 2020-10-10 MED ORDER — ADULT MULTIVITAMIN W/MINERALS CH
1.0000 | ORAL_TABLET | Freq: Every day | ORAL | Status: DC
  Administered 2020-10-10 – 2020-10-22 (×13): 1 via ORAL
  Filled 2020-10-10 (×13): qty 1

## 2020-10-10 MED ORDER — PROCHLORPERAZINE MALEATE 10 MG PO TABS
10.0000 mg | ORAL_TABLET | Freq: Four times a day (QID) | ORAL | Status: DC | PRN
Start: 2020-10-10 — End: 2020-10-22
  Filled 2020-10-10: qty 1

## 2020-10-10 MED ORDER — ASPIRIN EC 81 MG PO TBEC
81.0000 mg | DELAYED_RELEASE_TABLET | ORAL | Status: DC
  Administered 2020-10-10 – 2020-10-13 (×2): 81 mg via ORAL
  Filled 2020-10-10 (×2): qty 1

## 2020-10-10 MED ORDER — OXYCODONE HCL 5 MG PO TABS: 5.0000 mg | ORAL_TABLET | ORAL | Status: DC | PRN

## 2020-10-10 MED ORDER — SPIRONOLACTONE 25 MG PO TABS
50.0000 mg | ORAL_TABLET | Freq: Every day | ORAL | Status: DC
  Administered 2020-10-11 – 2020-10-21 (×11): 50 mg via ORAL
  Filled 2020-10-10 (×11): qty 2

## 2020-10-10 MED ORDER — ONDANSETRON HCL 4 MG/2ML IJ SOLN: 4.0000 mg | Freq: Four times a day (QID) | INTRAMUSCULAR | Status: DC | PRN

## 2020-10-10 MED ORDER — POLYETHYLENE GLYCOL 3350 17 G PO PACK: 17.0000 g | PACK | Freq: Every day | ORAL | Status: DC | PRN

## 2020-10-10 MED ORDER — ACETAMINOPHEN 325 MG PO TABS: 650.0000 mg | ORAL_TABLET | Freq: Four times a day (QID) | ORAL | Status: DC | PRN

## 2020-10-10 MED ORDER — LACTATED RINGERS IV SOLN: INTRAVENOUS | Status: DC

## 2020-10-10 NOTE — ED Provider Notes (Signed)
[facility] Emergency Department Provider Note  ____________________________________________   Event Date/Time   First MD Initiated Contact with Patient 10/10/20 1112     (approximate)  I have reviewed the triage vital signs and the nursing notes.   HISTORY  Chief Complaint Weakness    HPI Veronica Colon is a 87 y.o. female with past medical history as below here with generalized weakness.  The patient has had increasing weakness for the last several days.  She went to the cancer center and had a transfusion of red blood cells yesterday for this, but this did not improve her weakness.  She states that she has been essentially unable to ambulate for the last 24 hours due to this weakness.  She was ambulating fairly well until the last week or so, when she began to use a cane and now essentially is unable to do so.  She feels incredibly weak.  She has had poor appetite, which she attributes to the chemotherapy.  Denies any known fevers.  Denies any urinary symptoms.  Denies any recent medication changes.        Past Medical History:  Diagnosis Date  . Allergy   . Anxiety and depression   . Anxiety and depression   . Arthritis   . Breast cancer ([place]) 1990   LT MASTECTOMY  . Cancer ([place]) 1990   LT BREAST MASTECTOMY  . CKD (chronic kidney disease), stage III ([place])   . Diabetes mellitus without complication ([place])   . Family history of breast cancer   . Family history of pancreatic cancer   . GERD (gastroesophageal reflux disease)   . Hypertension   . Hypertensive CKD (chronic kidney disease)   . Personal history of chemotherapy   . Status post chemotherapy    breast cancer  . Temporal arteritis ([place]) 10/28/2016   Overview:  Right temporal arteritis  Biopsy proven 11/08/2016    Patient Active Problem List   Diagnosis Date Noted  . Dehydration 10/10/2020  . Family history of breast cancer   . Family history of pancreatic cancer   . Goals of care,  counseling/discussion 05/01/2020  . Pancreatic adenocarcinoma ([place]) 05/01/2020  . Monoclonal gammopathy of unknown significance (MGUS) 04/04/2020  . Lumbar spondylosis 10/16/2019  . Type 2 diabetes mellitus with obesity ([place]) 08/01/2018  . Hx of iron deficiency anemia 07/26/2018  . Pedal edema 03/01/2017  . Temporal arteritis ([place]) 10/28/2016  . Cervical pain 10/06/2016  . Mixed anxiety and depressive disorder 04/20/2016  . Allergic rhinitis 02/26/2015  . H/O malignant neoplasm of breast 02/26/2015  . H/O malignant neoplasm of colon 02/26/2015  . GERD (gastroesophageal reflux disease) 02/26/2015  . Hypertensive CKD (chronic kidney disease) 02/26/2015  . CKD (chronic kidney disease), stage III ([place]) 02/26/2015    Past Surgical History:  Procedure Laterality Date  . ABDOMINAL HYSTERECTOMY    . BREAST SURGERY    . COLONOSCOPY WITH PROPOFOL N/A 06/04/2015   Procedure: COLONOSCOPY WITH PROPOFOL;  Surgeon: Manya Silvas, MD;  Location: [facility] ENDOSCOPY;  Service: Endoscopy;  Laterality: N/A;  . IR IMAGING GUIDED PORT INSERTION  05/09/2020  . MASTECTOMY Left 1990   BREAST CA  . TEMPORAL ARTERY BIOPSY / LIGATION  11/2016    Prior to Admission medications   Medication Sig Start Date End Date Taking? Authorizing Provider  acetaminophen (TYLENOL) 325 MG tablet Take 650 mg by mouth every 6 (six) hours as needed for mild pain.   Yes [provider]  ascorbic  acid (VITAMIN C) 1000 MG tablet Take 1,000 mg by mouth 3 (three) times a week.   Yes [provider]  aspirin 81 MG tablet Take 81 mg by mouth 3 (three) times a week.   Yes [provider]  lidocaine-prilocaine (EMLA) cream Apply to port 1 hour prior to appointment. Cover with plastic wrap. 09/17/20  Yes Lloyd Huger, MD  Multiple Vitamin (MULTIVITAMIN) tablet Take 1 tablet by mouth daily.   Yes [provider]  OS-CAL CALCIUM + D3 500-200 MG-UNIT TABS Take 1 tablet by mouth daily. 10/25/17  Yes  [provider]  prochlorperazine (COMPAZINE) 10 MG tablet Take 1 tablet (10 mg total) by mouth every 6 (six) hours as needed (Nausea or vomiting). 07/23/20  Yes Lloyd Huger, MD  spironolactone (ALDACTONE) 50 MG tablet TAKE 1 TABLET BY MOUTH EVERY DAY 12/06/19  Yes Delsa Grana, PA-C    Allergies Hydrochlorothiazide, Lisinopril, and Metformin and related  Family History  Problem Relation Age of Onset  . Breast cancer Mother 14  . Lung cancer Father        lung  . Cancer Maternal Aunt        unk types  . Cancer Maternal Uncle        unk types  . Pancreatic cancer Cousin     Social History Social History   Tobacco Use  . Smoking status: Never Smoker  . Smokeless tobacco: Never Used  Vaping Use  . Vaping Use: Never used  Substance Use Topics  . Alcohol use: No    Alcohol/week: 0.0 standard drinks  . Drug use: No    Review of Systems  Review of Systems  Constitutional: Positive for fatigue. Negative for fever.  HENT: Negative for congestion and sore throat.   Eyes: Negative for visual disturbance.  Respiratory: Negative for cough and shortness of breath.   Cardiovascular: Negative for chest pain.  Gastrointestinal: Negative for abdominal pain, diarrhea, nausea and vomiting.  Genitourinary: Negative for flank pain.  Musculoskeletal: Negative for back pain and neck pain.  Skin: Negative for rash and wound.  Neurological: Positive for weakness and light-headedness.  All other systems reviewed and are negative.    ____________________________________________  PHYSICAL EXAM:      VITAL SIGNS: ED Triage Vitals [10/10/20 1058]  Enc Vitals Group     BP 139/72     Pulse Rate 86     Resp 18     Temp 98 F (36.7 C)     Temp Source Oral     SpO2 99 %     Weight 140 lb (63.5 kg)     Height 5\' 4"  (1.626 m)     Head Circumference      Peak Flow      Pain Score 0     Pain Loc      Pain Edu?      Excl. in [place]?      Physical Exam Vitals and nursing  note reviewed.  Constitutional:      General: She is not in acute distress.    Appearance: She is well-developed.  HENT:     Head: Normocephalic and atraumatic.     Mouth/Throat:     Mouth: Mucous membranes are dry.  Eyes:     Conjunctiva/sclera: Conjunctivae normal.  Cardiovascular:     Rate and Rhythm: Normal rate and regular rhythm.     Heart sounds: Normal heart sounds. No murmur heard. No friction rub.  Pulmonary:  Effort: Pulmonary effort is normal. No respiratory distress.     Breath sounds: Normal breath sounds. No wheezing or rales.  Abdominal:     General: There is no distension.     Palpations: Abdomen is soft.     Tenderness: There is no abdominal tenderness.  Musculoskeletal:     Cervical back: Neck supple.  Skin:    General: Skin is warm.     Capillary Refill: Capillary refill takes less than 2 seconds.  Neurological:     Mental Status: She is alert and oriented to person, place, and time.     Motor: No abnormal muscle tone.       ____________________________________________   LABS (all labs ordered are listed, but only abnormal results are displayed)  Labs Reviewed  CBC WITH DIFFERENTIAL/PLATELET - Abnormal; Notable for the following components:      Result Value   RBC 2.89 (*)    Hemoglobin 8.9 (*)    HCT 25.9 (*)    RDW 18.2 (*)    Platelets 146 (*)    nRBC 0.9 (*)    Lymphs Abs 0.3 (*)    All other components within normal limits  COMPREHENSIVE METABOLIC PANEL - Abnormal; Notable for the following components:   Sodium 128 (*)    CO2 18 (*)    Glucose, Bld 124 (*)    BUN 40 (*)    Creatinine, Ser 1.72 (*)    Calcium 8.5 (*)    Total Protein 5.5 (*)    Albumin 2.5 (*)    AST 45 (*)    Alkaline Phosphatase 202 (*)    Total Bilirubin 1.9 (*)    GFR, Estimated 28 (*)    All other components within normal limits  TROPONIN I (HIGH SENSITIVITY) - Abnormal; Notable for the following components:   Troponin I (High Sensitivity) 23 (*)    All  other components within normal limits  TROPONIN I (HIGH SENSITIVITY) - Abnormal; Notable for the following components:   Troponin I (High Sensitivity) 20 (*)    All other components within normal limits  RESP PANEL BY RT-PCR (FLU A&B, COVID) ARPGX2  LIPASE, BLOOD  URINALYSIS, COMPLETE (UACMP) WITH MICROSCOPIC  COMPREHENSIVE METABOLIC PANEL  CBC    ____________________________________________  EKG: ________________________________________  RADIOLOGY All imaging, including plain films, CT scans, and ultrasounds, independently reviewed by me, and interpretations confirmed via formal radiology reads.  ED MD interpretation:   CXR: Clear  Official radiology report(s): DG Chest Portable 1 View  Result Date: 10/10/2020 CLINICAL DATA:  Weakness. EXAM: PORTABLE CHEST 1 VIEW COMPARISON:  August 07, 2018. FINDINGS: The heart size and mediastinal contours are within normal limits. No pneumothorax or significant pleural effusion is noted. Right internal jugular Port-A-Cath is noted with distal tip in expected position of cavoatrial junction. Hypoinflation of the lungs is noted with minimal bibasilar subsegmental atelectasis. The visualized skeletal structures are unremarkable. IMPRESSION: Hypoinflation of the lungs with minimal bibasilar subsegmental atelectasis. Electronically Signed   By: Marijo Conception M.D.   On: 10/10/2020 13:10    ____________________________________________  PROCEDURES   Procedure(s) performed (including Critical Care):  .1-3 Lead EKG Interpretation Performed by: Duffy Bruce, MD Authorized by: Duffy Bruce, MD     Interpretation: normal     ECG rate:  70-90   ECG rate assessment: normal     Rhythm: sinus rhythm     Ectopy: none     Conduction: normal   Comments:     Indication: weakness  ____________________________________________  INITIAL IMPRESSION / MDM / ASSESSMENT AND PLAN / ED COURSE  As part of my medical decision making, I reviewed the  following data within the [facility] notes reviewed and incorporated, Old chart reviewed, Notes from prior ED visits, and [place] Controlled Substance Database       *Bridney Guadarrama Seyer was evaluated in Emergency Department on 10/10/2020 for the symptoms described in the history of present illness. She was evaluated in the context of the global COVID-19 pandemic, which necessitated consideration that the patient might be at risk for infection with the SARS-CoV-2 virus that causes COVID-19. Institutional protocols and algorithms that pertain to the evaluation of patients at risk for COVID-19 are in a state of rapid change based on information released by regulatory bodies including the CDC and federal and state organizations. These policies and algorithms were followed during the patient's care in the ED.  Some ED evaluations and interventions may be delayed as a result of limited staffing during the pandemic.*     Medical Decision Making:  87 yo F here with generalized weakness. Pt has h/o pancreatic adenoCA and is s/p recent transfusion, without relief. On exam, pt appears tired, weak but no focal deficits noted. Pt is afebrile. Labs show likely prerenal AKI with elevated BUN/Cr from baseline. Hgb is appropriately increased from recent transfusion. No fever or signs of sepsis. UA is pending. Will admit for IVF, PT/OT.  ____________________________________________  FINAL CLINICAL IMPRESSION(S) / ED DIAGNOSES  Final diagnoses:  Generalized weakness  Dehydration     MEDICATIONS GIVEN DURING THIS VISIT:  Medications  spironolactone (ALDACTONE) tablet 50 mg (has no administration in time range)  prochlorperazine (COMPAZINE) tablet 10 mg (has no administration in time range)  ascorbic acid (VITAMIN C) tablet 1,000 mg (has no administration in time range)  multivitamin with minerals tablet 1 tablet (has no administration in time range)  calcium-vitamin D (OSCAL WITH D) 500-200  MG-UNIT per tablet 1 tablet (has no administration in time range)  aspirin EC tablet 81 mg (has no administration in time range)  enoxaparin (LOVENOX) injection 40 mg (has no administration in time range)  lactated ringers infusion ( Intravenous New Bag/Given 10/10/20 1727)  acetaminophen (TYLENOL) tablet 650 mg (has no administration in time range)    Or  acetaminophen (TYLENOL) suppository 650 mg (has no administration in time range)  oxyCODONE (Oxy IR/ROXICODONE) immediate release tablet 5 mg (has no administration in time range)  methocarbamol (ROBAXIN) 500 mg in dextrose 5 % 50 mL IVPB (has no administration in time range)  traZODone (DESYREL) tablet 25 mg (has no administration in time range)  polyethylene glycol (MIRALAX / GLYCOLAX) packet 17 g (has no administration in time range)  ondansetron (ZOFRAN) tablet 4 mg (has no administration in time range)    Or  ondansetron (ZOFRAN) injection 4 mg (has no administration in time range)  0.9 %  sodium chloride infusion (0 mLs Intravenous Stopped 10/10/20 1719)     ED Discharge Orders    None       Note:  This document was prepared using Dragon voice recognition software and may include unintentional dictation errors.   Duffy Bruce, MD 10/10/20 [id number]

## 2020-10-10 NOTE — Telephone Encounter (Signed)
I agree.   Faythe Casa, NP 10/10/2020 11:11 AM

## 2020-10-10 NOTE — Consult Note (Signed)
[facility] CONSULT NOTE  Patient Care Team: Delsa Grana, PA-C as PCP - General (Family Medicine) Marlowe Sax, MD as Referring Physician (Internal Medicine) Birder Robson, MD as Referring Physician (Ophthalmology) Lloyd Huger, MD as Consulting Physician (Hematology and Oncology)  CHIEF COMPLAINTS/PURPOSE OF CONSULTATION: Metastatic pancreatic cancer  HISTORY OF PRESENTING ILLNESS:  Veronica Colon 87 y.o.  female patient with history of metastatic pancreatic adenocarcinoma currently on single agent is currently admitted to hospital for generalized weakness.   With regards to metastatic pancreatic adenocarcinoma cancer--patient is currently on single agent gemcitabine.  Patient as per the recent PET scan March 2022/tumor marker-noted to have improvement of her malignancy.   However as per the family patient noted to have worsening generalized weakness.  Patient recently received a PRBC transfusion for hemoglobin less than 8.  However patient noted to have progressive generalized weakness/difficulty walking-which led to admission to hospital.  On admission to hospital-noted sodium of 128; creatinine of 1.72; hemoglobin was 8.9.  Albumin noted to be low at 2.5.  Chest x-ray negative for acute process.  Patient is currently on IV fluids; supportive care for dehydration.  Review of Systems  Constitutional: Positive for malaise/fatigue. Negative for chills, diaphoresis, fever and weight loss.  HENT: Negative for nosebleeds and sore throat.   Eyes: Negative for double vision.  Respiratory: Positive for shortness of breath. Negative for cough, hemoptysis, sputum production and wheezing.   Cardiovascular: Positive for leg swelling. Negative for chest pain, palpitations and orthopnea.  Gastrointestinal: Negative for abdominal pain, blood in stool, constipation, diarrhea, heartburn, melena, nausea and vomiting.  Genitourinary: Negative for dysuria, frequency and  urgency.  Musculoskeletal: Positive for back pain. Negative for joint pain.  Skin: Negative.  Negative for itching and rash.  Neurological: Positive for weakness. Negative for dizziness, tingling, focal weakness and headaches.  Endo/Heme/Allergies: Does not bruise/bleed easily.  Psychiatric/Behavioral: Negative for depression. The patient is not nervous/anxious and does not have insomnia.      MEDICAL HISTORY:  Past Medical History:  Diagnosis Date  . Allergy   . Anxiety and depression   . Anxiety and depression   . Arthritis   . Breast cancer ([place]) 1990   LT MASTECTOMY  . Cancer ([place]) 1990   LT BREAST MASTECTOMY  . CKD (chronic kidney disease), stage III ([place])   . Diabetes mellitus without complication ([place])   . Family history of breast cancer   . Family history of pancreatic cancer   . GERD (gastroesophageal reflux disease)   . Hypertension   . Hypertensive CKD (chronic kidney disease)   . Personal history of chemotherapy   . Status post chemotherapy    breast cancer  . Temporal arteritis ([place]) 10/28/2016   Overview:  Right temporal arteritis  Biopsy proven 11/08/2016    SURGICAL HISTORY: Past Surgical History:  Procedure Laterality Date  . ABDOMINAL HYSTERECTOMY    . BREAST SURGERY    . COLONOSCOPY WITH PROPOFOL N/A 06/04/2015   Procedure: COLONOSCOPY WITH PROPOFOL;  Surgeon: Manya Silvas, MD;  Location[facility] ENDOSCOPY;  Service: Endoscopy;  Laterality: N/A;  . IR IMAGING GUIDED PORT INSERTION  05/09/2020  . MASTECTOMY Left 1990   BREAST CA  . TEMPORAL ARTERY BIOPSY / LIGATION  11/2016    SOCIAL HISTORY: Social History   Socioeconomic History  . Marital status: Widowed    Spouse name: Not on file  . Number of children: 1  . Years of education: 78  . Highest education level: Not on  file  Occupational History  . Occupation: retired  Tobacco Use  . Smoking status: Never Smoker  . Smokeless tobacco: Never Used  Vaping Use  . Vaping Use: Never used   Substance and Sexual Activity  . Alcohol use: No    Alcohol/week: 0.0 standard drinks  . Drug use: No  . Sexual activity: Not Currently  Other Topics Concern  . Not on file  Social History Narrative   Pt lives with her daughter   Social Determinants of Health   Financial Resource Strain: Low Risk   . Difficulty of Paying Living Expenses: Not hard at all  Food Insecurity: No Food Insecurity  . Worried About Charity fundraiser in the Last Year: Never true  . Ran Out of Food in the Last Year: Never true  Transportation Needs: No Transportation Needs  . Lack of Transportation (Medical): No  . Lack of Transportation (Non-Medical): No  Physical Activity: Inactive  . Days of Exercise per Week: 0 days  . Minutes of Exercise per Session: 0 min  Stress: No Stress Concern Present  . Feeling of Stress : Not at all  Social Connections: Moderately Integrated  . Frequency of Communication with Friends and Family: More than three times a week  . Frequency of Social Gatherings with Friends and Family: Twice a week  . Attends Religious Services: More than 4 times per year  . Active Member of Clubs or Organizations: Yes  . Attends Archivist Meetings: More than 4 times per year  . Marital Status: Divorced  Human resources officer Violence: Not At Risk  . Fear of Current or Ex-Partner: No  . Emotionally Abused: No  . Physically Abused: No  . Sexually Abused: No    FAMILY HISTORY: Family History  Problem Relation Age of Onset  . Breast cancer Mother 32  . Lung cancer Father        lung  . Cancer Maternal Aunt        unk types  . Cancer Maternal Uncle        unk types  . Pancreatic cancer Cousin     ALLERGIES:  is allergic to hydrochlorothiazide, lisinopril, and metformin and related.  MEDICATIONS:  Current Facility-Administered Medications  Medication Dose Route Frequency Provider Last Rate Last Admin  . acetaminophen (TYLENOL) tablet 650 mg  650 mg Oral Q6H PRN Clarnce Flock, MD       Or  . acetaminophen (TYLENOL) suppository 650 mg  650 mg Rectal Q6H PRN Clarnce Flock, MD      . ascorbic acid (VITAMIN C) tablet 1,000 mg  1,000 mg Oral Once per day on Mon Wed Fri Clarnce Flock, MD   1,000 mg at 10/10/20 2025  . aspirin EC tablet 81 mg  81 mg Oral Once per day on Mon Wed Fri Clarnce Flock, MD   81 mg at 10/10/20 2024  . calcium-vitamin D (OSCAL WITH D) 500-200 MG-UNIT per tablet 1 tablet  1 tablet Oral Daily Clarnce Flock, MD   1 tablet at 10/10/20 2024  . enoxaparin (LOVENOX) injection 30 mg  30 mg Subcutaneous Q24H Clarnce Flock, MD   30 mg at 10/10/20 2023  . lactated ringers infusion   Intravenous Continuous Clarnce Flock, MD 125 mL/hr at 10/10/20 1727 New Bag at 10/10/20 1727  . methocarbamol (ROBAXIN) 500 mg in dextrose 5 % 50 mL IVPB  500 mg Intravenous Q6H PRN Clarnce Flock, MD      .  multivitamin with minerals tablet 1 tablet  1 tablet Oral Daily Clarnce Flock, MD   1 tablet at 10/10/20 2026  . ondansetron (ZOFRAN) tablet 4 mg  4 mg Oral Q6H PRN Clarnce Flock, MD       Or  . ondansetron [facility]) injection 4 mg  4 mg Intravenous Q6H PRN Clarnce Flock, MD      . oxyCODONE (Oxy IR/ROXICODONE) immediate release tablet 5 mg  5 mg Oral Q4H PRN Clarnce Flock, MD      . polyethylene glycol (MIRALAX / GLYCOLAX) packet 17 g  17 g Oral Daily PRN Clarnce Flock, MD      . prochlorperazine (COMPAZINE) tablet 10 mg  10 mg Oral Q6H PRN Clarnce Flock, MD      . Derrill Memo ON 10/11/2020] spironolactone (ALDACTONE) tablet 50 mg  50 mg Oral Daily Clarnce Flock, MD      . traZODone (DESYREL) tablet 25 mg  25 mg Oral QHS PRN Clarnce Flock, MD          .  PHYSICAL EXAMINATION:  Vitals:   10/10/20 1715 10/10/20 2038  BP: 136/70 140/76  Pulse: 78 84  Resp: 15 15  Temp: 98.6 F (37 C) 98 F (36.7 C)  SpO2: 96% 99%   Filed Weights   10/10/20 1058 10/10/20 1715  Weight: 140 lb (63.5 kg) 160  lb 4.4 oz (72.7 kg)    Physical Exam HENT:     Head: Normocephalic and atraumatic.     Mouth/Throat:     Pharynx: No oropharyngeal exudate.  Eyes:     Pupils: Pupils are equal, round, and reactive to light.  Cardiovascular:     Rate and Rhythm: Normal rate and regular rhythm.  Pulmonary:     Effort: No respiratory distress.     Breath sounds: No wheezing.     Comments: Decreased air entry bilaterally bases. Abdominal:     General: Bowel sounds are normal. There is no distension.     Palpations: Abdomen is soft. There is no mass.     Tenderness: There is no abdominal tenderness. There is no guarding or rebound.  Musculoskeletal:        General: No tenderness. Normal range of motion.     Cervical back: Normal range of motion and neck supple.  Skin:    General: Skin is warm.  Neurological:     Mental Status: She is alert and oriented to person, place, and time.  Psychiatric:        Mood and Affect: Affect normal.      LABORATORY DATA:  I have reviewed the data as listed Lab Results  Component Value Date   WBC 7.4 10/10/2020   HGB 8.9 (L) 10/10/2020   HCT 25.9 (L) 10/10/2020   MCV 89.6 10/10/2020   PLT 146 (L) 10/10/2020   Recent Labs    03/24/20 1008 04/07/20 1221 04/11/20 0858 05/12/20 0903 10/01/20 0852 10/08/20 0818 10/10/20 1226  NA 140 137 139   < > 132* 130* 128*  K 3.9 4.1 4.0   < > 3.6 3.9 4.3  CL 102 102 104   < > 101 101 101  CO2 21 25 24    < > 20* 20* 18*  GLUCOSE 124* 107* 134*   < > 151* 150* 124*  BUN 21 23 22    < > 17 26* 40*  CREATININE 1.45* 1.73* 1.51*   < > 1.41* 1.65* 1.72*  CALCIUM 9.7 9.9  9.7   < > 9.0 8.8* 8.5*  GFRNONAA 33* 26* 31*   < > 36* 30* 28*  GFRAA 38* 30* 36*  --   --   --   --   PROT  --   --  7.0   < > 6.1* 5.9* 5.5*  ALBUMIN  --   --  4.0   < > 3.1* 2.7* 2.5*  AST  --   --  26   < > 40 96* 45*  ALT  --   --  19   < > 25 68* 42  ALKPHOS  --   --  62   < > 151* 257* 202*  BILITOT  --   --  0.7   < > 1.2 1.6* 1.9*   < >  = values in this interval not displayed.    RADIOGRAPHIC STUDIES: I have personally reviewed the radiological images as listed and agreed with the findings in the report. NM PET Image Restag (PS) Skull Base To Thigh  Result Date: 09/24/2020 CLINICAL DATA:  Aortic Atherosclerois (ICD10-170.0) treatment strategy for pancreatic cancer. EXAM: NUCLEAR MEDICINE PET SKULL BASE TO THIGH TECHNIQUE: 8.1 mCi F-18 FDG was injected intravenously. Full-ring PET imaging was performed from the skull base to thigh after the radiotracer. CT data was obtained and used for attenuation correction and anatomic localization. Fasting blood glucose: 111 mg/dl COMPARISON:  04/17/2020. FINDINGS: Mediastinal blood pool activity: SUV max 3.1 Liver activity: SUV max NA NECK: No hypermetabolic lymph nodes in the neck. Incidental CT findings: none CHEST: No hypermetabolic mediastinal or hilar nodes. No suspicious pulmonary nodules on the CT scan. Incidental CT findings: Coronary artery calcification is evident. Atherosclerotic calcification is noted in the wall of the thoracic aorta. Right Port-A-Cath tip is positioned at the SVC/RA junction. ABDOMEN/PELVIS: Hypermetabolism seen in the pancreatic lesion previously has decreased slightly in the interval with SUV max = 5.2 today compared to 7.7 previously. The left para-aortic retroperitoneal lesion measures 1.7 x 1.6 cm today and shows low level hypermetabolism with SUV max = 3.8. This is lesion that measured 2.6 x 1.3 cm on previous PET-CT with SUV max = 6.6 and represents the site of left ureteral obstruction. Omental disease seen previously has decreased on CT imaging. Low level FDG accumulation identified in a 2.5 cm left paramidline omental lesion (165/3) with SUV max = 4.0 1.7 cm small midline omental lesion just deep to the umbilicus on 161/0 demonstrates SUV max = 3.8. Incidental CT findings: Left hydronephrosis is similar to prior. Moderate volume ascites is new in the interval.  There is abdominal aortic atherosclerosis without aneurysm. Gallbladder is distended SKELETON: No focal hypermetabolic activity to suggest skeletal metastasis. Incidental CT findings: No worrisome lytic or sclerotic osseous abnormality. IMPRESSION: 1. Interval slight decrease in hypermetabolism associated with the patient's known pancreatic lesion. Main duct dilatation in the tail of pancreas is similar to prior. 2. Hypermetabolic left retroperitoneal/para-aortic lesion measures slightly smaller today with associated interval decrease in hypermetabolic FDG accumulation. This lesion obstructs the left ureter with similar appearance of left hydroureteronephrosis. 3. Omental disease identified on the previous study has decreased in the interval but persists. 4. Despite the generalized trend towards improvement on today's study, there is new moderate volume ascites in the abdomen and pelvis. Electronically Signed   By: Misty Stanley M.D.   On: 09/24/2020 15:40   DG Chest Portable 1 View  Result Date: 10/10/2020 CLINICAL DATA:  Weakness. EXAM: PORTABLE CHEST 1 VIEW COMPARISON:  August 07, 2018. FINDINGS: The heart size and mediastinal contours are within normal limits. No pneumothorax or significant pleural effusion is noted. Right internal jugular Port-A-Cath is noted with distal tip in expected position of cavoatrial junction. Hypoinflation of the lungs is noted with minimal bibasilar subsegmental atelectasis. The visualized skeletal structures are unremarkable. IMPRESSION: Hypoinflation of the lungs with minimal bibasilar subsegmental atelectasis. Electronically Signed   By: Marijo Conception M.D.   On: 10/10/2020 13:10    Pancreatic adenocarcinoma [facility]) #87 year old female patient with metastatic pancreatic cancer currently on single agent gemcitabine is currently admitted to hospital for generalized weakness/dehydration  #Metastatic pancreatic adenocarcinoma-single agent gemcitabine-most recent PET scan  March 2022-showed partial response.  Tumor marker also improving.  #General debility/worsening anemia/dehydration with hyponatremia/acute on chronic renal failure/malnutrition-question related to chemotherapy side effects.   Recommendation:  #Agree with IV hydration; correction of electrolytes/renal function.   #Transfuse PRBC-hemoglobin less than 8  #We will defer to Dr. Grayland Ormond regarding consideration of chemotherapy holiday to help patient recover from chemotherapy adverse effects.   Thank you Dr. Dione Plover for allowing me to participate in the care of your pleasant patient. Please do not hesitate to contact me with questions or concerns in the interim.  The above plan of care was discussed with the patient/sister by the bedside.   All questions were answered. The patient knows to call the clinic with any problems, questions or concerns.    Cammie Sickle, MD 10/10/2020 10:48 PM

## 2020-10-10 NOTE — Assessment & Plan Note (Addendum)
#  87 year old female patient with metastatic pancreatic cancer currently on single agent gemcitabine is currently admitted to hospital for generalized weakness/dehydration  #Metastatic pancreatic adenocarcinoma-single agent gemcitabine-most recent PET scan March 2022-showed partial response.  Tumor marker also improving.  #General debility/worsening anemia/dehydration with hyponatremia/acute on chronic renal failure/malnutrition-question related to chemotherapy side effects.   Recommendation:  #Agree with IV hydration; correction of electrolytes/renal function.   #Transfuse PRBC-hemoglobin less than 8  #We will defer to Dr. Grayland Ormond regarding consideration of chemotherapy holiday to help patient recover from chemotherapy adverse effects.   Thank you Dr. Dione Plover for allowing me to participate in the care of your pleasant patient. Please do not hesitate to contact me with questions or concerns in the interim.  The above plan of care was discussed with the patient/sister by the bedside.

## 2020-10-10 NOTE — H&P (Signed)
[facility] History and Physical  Veronica Colon ZDG:[id number] DOB: 1932/03/09 DOA: 10/10/2020  Referring physician: Dr. Ellender Hose PCP: Delsa Grana, PA-C   Chief Complaint: weakness  HPI: Veronica Colon is a 87 y.o. female with hx of metastatic pancreatic cancer currently undergoing palliative chemotherapy, history of breast and colon cancer in remission, type 2 diabetes, MGUS, CKD, who presents with profound weakness.  Patient reports that she has been steadily declining physically over the past several weeks.  She has been discussing this with her oncologist and received a blood transfusion yesterday with the hope that it would help.  However both she and her daughter who is at bedside state that she seemed to have gotten worse since yesterday.  Patient currently lives with her daughter and her son-in-law, however they are both older and have difficulty caring for her.  In the ED initial vital signs unremarkable.  CMP notable for mildly worsening hyponatremia of 128 down from 130, creatinine increased from 1.4 a week ago to 1.7, BUN increased over that time from 17 to 40, albumin 2.5, T bili mildly elevated at 1.9 from 1.2-week ago.  Initial troponin elevated at 23 but down trended to 20.  CBC shows appropriate rise in hemoglobin from 7.9-8.9 since blood transfusion the day prior, thrombocytopenia mild at 146, differential largely unremarkable.  Covid test was negative and a chest x-ray was unremarkable.  Review of Systems:  Pertinent positives and negative per HPI, all others reviewed and negative  Past Medical History:  Diagnosis Date  . Allergy   . Anxiety and depression   . Anxiety and depression   . Arthritis   . Breast cancer ([place]) 1990   LT MASTECTOMY  . Cancer ([place]) 1990   LT BREAST MASTECTOMY  . CKD (chronic kidney disease), stage III ([place])   . Diabetes mellitus without complication ([place])   . Family history of breast cancer   . Family history of pancreatic cancer   . GERD  (gastroesophageal reflux disease)   . Hypertension   . Hypertensive CKD (chronic kidney disease)   . Personal history of chemotherapy   . Status post chemotherapy    breast cancer  . Temporal arteritis ([place]) 10/28/2016   Overview:  Right temporal arteritis  Biopsy proven 11/08/2016   Past Surgical History:  Procedure Laterality Date  . ABDOMINAL HYSTERECTOMY    . BREAST SURGERY    . COLONOSCOPY WITH PROPOFOL N/A 06/04/2015   Procedure: COLONOSCOPY WITH PROPOFOL;  Surgeon: Manya Silvas, MD;  Location: [facility] ENDOSCOPY;  Service: Endoscopy;  Laterality: N/A;  . IR IMAGING GUIDED PORT INSERTION  05/09/2020  . MASTECTOMY Left 1990   BREAST CA  . TEMPORAL ARTERY BIOPSY / LIGATION  11/2016   Social History:  reports that she has never smoked. She has never used smokeless tobacco. She reports that she does not drink alcohol and does not use drugs.  Allergies  Allergen Reactions  . Hydrochlorothiazide Other (See Comments)    Hypokalemia   . Lisinopril Other (See Comments) and Cough    Hands numb   . Metformin And Related     Fatigue, "heart was beating slower"    Family History  Problem Relation Age of Onset  . Breast cancer Mother 55  . Lung cancer Father        lung  . Cancer Maternal Aunt        unk types  . Cancer Maternal Uncle        unk types  .  Pancreatic cancer Cousin      Prior to Admission medications   Medication Sig Start Date End Date Taking? Authorizing Provider  acetaminophen (TYLENOL) 325 MG tablet Take 650 mg by mouth every 6 (six) hours as needed for mild pain.   Yes [provider]  ascorbic acid (VITAMIN C) 1000 MG tablet Take 1,000 mg by mouth 3 (three) times a week.   Yes [provider]  aspirin 81 MG tablet Take 81 mg by mouth 3 (three) times a week.   Yes [provider]  lidocaine-prilocaine (EMLA) cream Apply to port 1 hour prior to appointment. Cover with plastic wrap. 09/17/20  Yes Lloyd Huger, MD  Multiple  Vitamin (MULTIVITAMIN) tablet Take 1 tablet by mouth daily.   Yes [provider]  OS-CAL CALCIUM + D3 500-200 MG-UNIT TABS Take 1 tablet by mouth daily. 10/25/17  Yes [provider]  prochlorperazine (COMPAZINE) 10 MG tablet Take 1 tablet (10 mg total) by mouth every 6 (six) hours as needed (Nausea or vomiting). 07/23/20  Yes Lloyd Huger, MD  spironolactone (ALDACTONE) 50 MG tablet TAKE 1 TABLET BY MOUTH EVERY DAY 12/06/19  Yes Delsa Grana, PA-C   Physical Exam: Vitals:   10/10/20 1230 10/10/20 1236 10/10/20 1247 10/10/20 1400  BP: (!) 144/74  140/78 133/78  Pulse: 73 76 73 78  Resp:  15 15 18   Temp:      TempSrc:      SpO2: 100% 99% 99% 99%  Weight:      Height:        Wt Readings from Last 3 Encounters:  10/10/20 63.5 kg  10/01/20 64.6 kg  09/17/20 65.4 kg     . General:  Appears calm, chronically ill . Eyes: PERRL, normal lids, irises & conjunctiva . ENT: grossly normal hearing . Neck: no masses . Cardiovascular: RRR, no m/r/g. 1+ LE edema. Marland Kitchen Respiratory: Normal respiratory effort. . Abdomen: soft, ntnd, no masses palpated . Skin: no rash or induration seen on limited exam . Psychiatric: grossly normal mood and affect, speech fluent and appropriate          Labs on Admission:  Basic Metabolic Panel: Recent Labs  Lab 10/08/20 0818 10/10/20 1226  NA 130* 128*  K 3.9 4.3  CL 101 101  CO2 20* 18*  GLUCOSE 150* 124*  BUN 26* 40*  CREATININE 1.65* 1.72*  CALCIUM 8.8* 8.5*   Liver Function Tests: Recent Labs  Lab 10/08/20 0818 10/10/20 1226  AST 96* 45*  ALT 68* 42  ALKPHOS 257* 202*  BILITOT 1.6* 1.9*  PROT 5.9* 5.5*  ALBUMIN 2.7* 2.5*   Recent Labs  Lab 10/10/20 1226  LIPASE 24   No results for input(s): AMMONIA in the last 168 hours. CBC: Recent Labs  Lab 10/08/20 0818 10/10/20 1226  WBC 3.7* 7.4  NEUTROABS 2.3 7.0  HGB 7.9* 8.9*  HCT 23.5* 25.9*  MCV 92.2 89.6  PLT 200 146*   Cardiac Enzymes: No results for  input(s): CKTOTAL, CKMB, CKMBINDEX, TROPONINI in the last 168 hours.  BNP (last 3 results) No results for input(s): BNP in the last 8760 hours.  ProBNP (last 3 results) No results for input(s): PROBNP in the last 8760 hours.  CBG: No results for input(s): GLUCAP in the last 168 hours.  Radiological Exams on Admission: DG Chest Portable 1 View  Result Date: 10/10/2020 CLINICAL DATA:  Weakness. EXAM: PORTABLE CHEST 1 VIEW COMPARISON:  August 07, 2018. FINDINGS: The heart size and  mediastinal contours are within normal limits. No pneumothorax or significant pleural effusion is noted. Right internal jugular Port-A-Cath is noted with distal tip in expected position of cavoatrial junction. Hypoinflation of the lungs is noted with minimal bibasilar subsegmental atelectasis. The visualized skeletal structures are unremarkable. IMPRESSION: Hypoinflation of the lungs with minimal bibasilar subsegmental atelectasis. Electronically Signed   By: Marijo Conception M.D.   On: 10/10/2020 13:10    EKG:  Not obtained  Assessment/Plan Active Problems:   H/O malignant neoplasm of breast   H/O malignant neoplasm of colon   CKD (chronic kidney disease), stage III (HCC)   Temporal arteritis (HCC)   Type 2 diabetes mellitus with obesity (HCC)   Monoclonal gammopathy of unknown significance (MGUS)   Pancreatic adenocarcinoma ([place])   Dehydration   #Physical Deconditioning #Metastatic pancreatic cancer Patient presenting with profound deconditioning and inability to ambulate in the setting of metastatic pancreatic cancer for which she is receiving palliative chemotherapy.  Likely due to combination of side effects from chemotherapy and progression of her underlying malignancy.  I had a frank discussion with the patient and her daughter regarding goals of care and the possibility that they had reached an inflection point where attempting to have more quantity of life was going to be in opposition to having more  quality of life.  They are interested in seeing palliative care to discuss this further. -Palliative care consulted, however no weekend coverage at [facility].  If patient still admitted on Monday they will come to see patient -Heme-onc consulted -PT/OT  #AKI Mild AKI in the setting of patient's deconditioning as well as poor appetite with underlying malignancy.  Will gently fluid resuscitate and watch closely, already has some signs of third spacing in her legs which patient's daughter reports is new over the past week. -LR at 125 cc an hour for 24 hours -Trend CMP  #Elevated troponin Unclear etiology, likely secondary to poor clearance in setting of AKI.  #Hyponatremia Likely hypovolemic, trend with fluid resuscitation.  #Thrombocytopenia #Anemia Secondary to chemotherapy and underlying malignancy   Code Status: Full code, confirmed with patient at bedside in presence of her daughter.  Encouraged her to complete a MOLST and DNR/DNI form. DVT Prophylaxis: Lovenox Family Communication: Daughter Veronica Colon updated at bedside Disposition Plan: Inpatient, MedSurg  Time spent: 50 min  Clarnce Flock MD/MP[facility]  Note:  This document was prepared using Dragon voice recognition software and may include unintentional dictation errors.

## 2020-10-10 NOTE — Telephone Encounter (Signed)
Veronica Colon called reporting that the patient is so weak she cannot get up or walk and she is concerned about her, she than mentioned something about a blood transfusion. She was asking if she needs to take her to ER and if she should call ambulance. I advised her that if patient is to weak to get to car that she should call EMS and have her taken to ER for evaluation.

## 2020-10-10 NOTE — ED Triage Notes (Signed)
Pt comes into the ED via POV c/o increased weakness.  Pt received a blood transfusion yesterday with no improvement of fatigue.  Pt denies any CP, SHOB, dizziness, or nausea.  Pt currently has even and unlabored respirations.  Pt currently has pancreatic cancer.

## 2020-10-10 NOTE — Evaluation (Signed)
Physical Therapy Evaluation Patient Details Name: Veronica Colon MRN: [id number] DOB: 1932-04-14 Today's Date: 10/10/2020   History of Present Illness  Pt is an 87 y.o. female with hx of metastatic pancreatic cancer currently undergoing palliative chemotherapy, history of breast and colon cancer in remission, type 2 diabetes, MGUS, CKD, who presents with profound weakness.  MD assessment includes: physical deconditioning, metastatic pancreatic cancer, AKI, elevated troponin, hyponatremia, and anemia.    Clinical Impression  Pt was pleasant and motivated to participate during the session but ultimately presented with significant deficits in functional strength and was quite limited in her ability to perform functional tasks.  Pt required physical assistance with all tasks and was only able to take several very small effortful steps at the EOB before needing to return to sitting secondary to fatigue.  Pt's SpO2 and HR were both WNL during the session.  Pt will benefit from PT services in a SNF setting upon discharge to safely address deficits listed in patient problem list for decreased caregiver assistance and eventual return to PLOF.     Follow Up Recommendations SNF;Supervision for mobility/OOB    Equipment Recommendations  Rolling walker with 5" wheels    Recommendations for Other Services       Precautions / Restrictions Precautions Precautions: Fall Restrictions Weight Bearing Restrictions: No      Mobility  Bed Mobility Overal bed mobility: Needs Assistance Bed Mobility: Rolling;Sidelying to Sit;Sit to Sidelying Rolling: Min assist Sidelying to sit: Mod assist     Sit to sidelying: Mod assist General bed mobility comments: Min to mod A with bed mobility tasks for BLE and trunk control with log roll training provided for decreased caregiver assistance    Transfers Overall transfer level: Needs assistance Equipment used: Rolling walker (2 wheeled) Transfers: Sit to/from  Stand Sit to Stand: Min assist;From elevated surface         General transfer comment: Min verbal cues for hand placement  Ambulation/Gait Ambulation/Gait assistance: Min assist Gait Distance (Feet): 1 Feet Assistive device: Rolling walker (2 wheeled) Gait Pattern/deviations: Step-to pattern;Trunk flexed;Shuffle;Decreased step length - right;Decreased step length - left Gait velocity: decreased   General Gait Details: Min A to guide the RW with pt only able to take several very small, shuffling steps at the [facility]  Stairs            Wheelchair Mobility    Modified Rankin (Stroke Patients Only)       Balance Overall balance assessment: Needs assistance Sitting-balance support: Feet unsupported;Bilateral upper extremity supported Sitting balance-Leahy Scale: Fair Sitting balance - Comments: Minor posterior lean in sitting that the pt was able to self-correct Postural control: Posterior lean Standing balance support: Bilateral upper extremity supported;During functional activity Standing balance-Leahy Scale: Fair Standing balance comment: Mod lean on the RW for support in standing but no LOB                             Pertinent Vitals/Pain Pain Assessment: 0-10 Pain Score: 5  Pain Location: abdominal pain Pain Descriptors / Indicators: Sore Pain Intervention(s): Monitored during session;Repositioned    Home Living Family/patient expects to be discharged to:: Private residence Living Arrangements: Children Available Help at Discharge: Family;Available 24 hours/day Type of Home: House Home Access: Level entry     Home Layout: One level Home Equipment: Bedside commode;Walker - 4 wheels;Cane - single point;Shower seat - built in Additional Comments: Pt lives with dtr and SIL  Prior Function Level of Independence: Independent with assistive device(s)         Comments: Mod Ind amb with a SPC community distances but declining over the last several  weeks, recently using a rollator for limited ambulation; no fall history, Assist from family with ADLs     Hand Dominance        Extremity/Trunk Assessment   Upper Extremity Assessment Upper Extremity Assessment: Generalized weakness    Lower Extremity Assessment Lower Extremity Assessment: Generalized weakness       Communication   Communication: No difficulties  Cognition Arousal/Alertness: Lethargic Behavior During Therapy: WFL for tasks assessed/performed Overall Cognitive Status: Within Functional Limits for tasks assessed                                        General Comments      Exercises Total Joint Exercises Ankle Circles/Pumps: AROM;Strengthening;Both;10 reps Quad Sets: 10 reps;Strengthening;Both;5 reps Gluteal Sets: Strengthening;Both;10 reps Heel Slides: AROM;AAROM;Strengthening;Both;5 reps Long Arc Quad: AROM;Strengthening;Both;5 reps;10 reps Knee Flexion: AROM;Strengthening;Both;5 reps;10 reps Other Exercises Other Exercises: Pt and daughter education on HEP for BLE APs, QS, LAQs, and GS x 10 each as able 5-6x/day   Assessment/Plan    PT Assessment Patient needs continued PT services  PT Problem List Decreased strength;Decreased activity tolerance;Decreased balance;Decreased mobility;Decreased knowledge of use of DME       PT Treatment Interventions DME instruction;Gait training;Functional mobility training;Therapeutic activities;Therapeutic exercise;Balance training;Patient/family education    PT Goals (Current goals can be found in the Care Plan section)  Acute Rehab PT Goals Patient Stated Goal: To get stronger PT Goal Formulation: With patient Time For Goal Achievement: 10/23/20 Potential to Achieve Goals: Fair    Frequency Min 2X/week   Barriers to discharge Inaccessible home environment      Co-evaluation               AM-PAC PT "6 Clicks" Mobility  Outcome Measure Help needed turning from your back to your  side while in a flat bed without using bedrails?: A Lot Help needed moving from lying on your back to sitting on the side of a flat bed without using bedrails?: A Lot Help needed moving to and from a bed to a chair (including a wheelchair)?: A Lot   Help needed to walk in hospital room?: Total Help needed climbing 3-5 steps with a railing? : Total 6 Click Score: 8    End of Session Equipment Utilized During Treatment: Gait belt Activity Tolerance: Patient limited by fatigue Patient left: in bed;with call bell/phone within reach;with bed alarm set;with family/visitor present Nurse Communication: Mobility status PT Visit Diagnosis: Difficulty in walking, not elsewhere classified (R26.2);Muscle weakness (generalized) (M62.81)    Time: 8937-3428 PT Time Calculation (min) (ACUTE ONLY): 33 min   Charges:   PT Evaluation $PT Eval Moderate Complexity: 1 Mod PT Treatments $Therapeutic Exercise: 8-22 mins       D. Royetta Asal PT, DPT 10/10/20, 5:31 PM

## 2020-10-11 ENCOUNTER — Encounter: Payer: Self-pay | Admitting: Family Medicine

## 2020-10-11 DIAGNOSIS — R531 Weakness: Secondary | ICD-10-CM

## 2020-10-11 DIAGNOSIS — N179 Acute kidney failure, unspecified: Secondary | ICD-10-CM

## 2020-10-11 DIAGNOSIS — C7889 Secondary malignant neoplasm of other digestive organs: Secondary | ICD-10-CM | POA: Diagnosis not present

## 2020-10-11 DIAGNOSIS — E871 Hypo-osmolality and hyponatremia: Secondary | ICD-10-CM | POA: Diagnosis not present

## 2020-10-11 DIAGNOSIS — T451X5A Adverse effect of antineoplastic and immunosuppressive drugs, initial encounter: Secondary | ICD-10-CM

## 2020-10-11 DIAGNOSIS — D696 Thrombocytopenia, unspecified: Secondary | ICD-10-CM

## 2020-10-11 DIAGNOSIS — D6481 Anemia due to antineoplastic chemotherapy: Secondary | ICD-10-CM

## 2020-10-11 DIAGNOSIS — N189 Chronic kidney disease, unspecified: Secondary | ICD-10-CM

## 2020-10-11 DIAGNOSIS — N1330 Unspecified hydronephrosis: Secondary | ICD-10-CM

## 2020-10-11 LAB — COMPREHENSIVE METABOLIC PANEL
ALT: 31 U/L (ref 0–44)
AST: 38 U/L (ref 15–41)
Albumin: 2.3 g/dL — ABNORMAL LOW (ref 3.5–5.0)
Alkaline Phosphatase: 173 U/L — ABNORMAL HIGH (ref 38–126)
Anion gap: 8 (ref 5–15)
BUN: 36 mg/dL — ABNORMAL HIGH (ref 8–23)
CO2: 18 mmol/L — ABNORMAL LOW (ref 22–32)
Calcium: 8.3 mg/dL — ABNORMAL LOW (ref 8.9–10.3)
Chloride: 98 mmol/L (ref 98–111)
Creatinine, Ser: 1.52 mg/dL — ABNORMAL HIGH (ref 0.44–1.00)
GFR, Estimated: 33 mL/min — ABNORMAL LOW (ref >60)
Glucose, Bld: 95 mg/dL (ref 70–99)
Potassium: 4.6 mmol/L (ref 3.5–5.1)
Sodium: 124 mmol/L — ABNORMAL LOW (ref 135–145)
Total Bilirubin: 2.1 mg/dL — ABNORMAL HIGH (ref 0.3–1.2)
Total Protein: 5.2 g/dL — ABNORMAL LOW (ref 6.5–8.1)

## 2020-10-11 LAB — CBC
HCT: 25.4 % — ABNORMAL LOW (ref 36.0–46.0)
Hemoglobin: 8.4 g/dL — ABNORMAL LOW (ref 12.0–15.0)
MCH: 30 pg (ref 26.0–34.0)
MCHC: 33.1 g/dL (ref 30.0–36.0)
MCV: 90.7 fL (ref 80.0–100.0)
Platelets: 128 10*3/uL — ABNORMAL LOW (ref 150–400)
RBC: 2.8 MIL/uL — ABNORMAL LOW (ref 3.87–5.11)
RDW: 18.6 % — ABNORMAL HIGH (ref 11.5–15.5)
WBC: 8.3 10*3/uL (ref 4.0–10.5)
nRBC: 0.4 % — ABNORMAL HIGH (ref 0.0–0.2)

## 2020-10-11 LAB — URINALYSIS, COMPLETE (UACMP) WITH MICROSCOPIC
Bilirubin Urine: NEGATIVE
Glucose, UA: NEGATIVE mg/dL
Hgb urine dipstick: NEGATIVE
Ketones, ur: NEGATIVE mg/dL
Leukocytes,Ua: NEGATIVE
Nitrite: NEGATIVE
Protein, ur: NEGATIVE mg/dL
Specific Gravity, Urine: 1.012 (ref 1.005–1.030)
pH: 5 (ref 5.0–8.0)

## 2020-10-11 LAB — OSMOLALITY, URINE: Osmolality, Ur: 356 mOsm/kg (ref 300–900)

## 2020-10-11 LAB — HEMOGLOBIN A1C
Hgb A1c MFr Bld: 5.9 % — ABNORMAL HIGH (ref 4.8–5.6)
Mean Plasma Glucose: 122.63 mg/dL

## 2020-10-11 LAB — SODIUM, URINE, RANDOM: Sodium, Ur: <10 mmol/L

## 2020-10-11 MED ORDER — ENSURE ENLIVE PO LIQD
237.0000 mL | Freq: Two times a day (BID) | ORAL | Status: DC
  Administered 2020-10-12: 14:00:00 237 mL via ORAL

## 2020-10-11 MED ORDER — SODIUM CHLORIDE 0.9 % IV SOLN: INTRAVENOUS | Status: DC

## 2020-10-11 MED ORDER — CHLORHEXIDINE GLUCONATE CLOTH 2 % EX PADS
6.0000 | MEDICATED_PAD | Freq: Every day | CUTANEOUS | Status: DC
  Administered 2020-10-12 – 2020-10-22 (×10): 6 via TOPICAL

## 2020-10-11 NOTE — TOC Initial Note (Signed)
Transition of Care [facility]) - Initial/Assessment Note    Patient Details  Name: Veronica Colon MRN: [id number] Date of Birth: [birth year]  Transition of [facility]) CM/SW Contact:    Harriet Masson, RN Phone [phone] 10/11/2020, 4:08 PM  Clinical Narrative:                 Recommendation for PT/OT via SNF placement. TOC RN spoke with pt's daughter Veronica Colon [phone] who indicates pt is HOH. Caregiver agreed to to any SHF that will take the pt's insurance for coverage and accommodate her ongoing chemotherapy twice weekly every other week.   Obtained PASSR# [id number] A. FL2 completed with selected bed search.   Pt lives with her daughter in her house with supportive family. Daughter who is retired Ecologist pt's medications and all her medical appointments. Daughter states pt current receiving chemotherapy twice weekly every other week.   Expected Discharge Plan: Skilled Nursing Facility Barriers to Discharge: Other (comment) (Pt continue ongoing chemotherapy)   Patient Goals and CMS Choice     Choice offered to / list presented to : Adult Children  Expected Discharge Plan and Services Expected Discharge Plan: [place]   Discharge Planning Services: CM Consult   Living arrangements for the past 2 months: Single Family Home (Lives with her daughter)                                      Prior Living Arrangements/Services Living arrangements for the past 2 months: Single Family Home (Lives with her daughter) Lives with:: Adult Children Patient language and need for interpreter reviewed:: Yes Do you feel safe going back to the place where you live?: Yes      Need for Family Participation in Patient Care: Yes (Comment) Care giver support system in place?: Yes (comment)   Criminal Activity/Legal Involvement Pertinent to Current Situation/Hospitalization: No - Comment as needed  Activities of Daily Living      Permission  Sought/Granted   Permission granted to share information with : Yes, Verbal Permission Granted              Emotional Assessment Appearance:: Appears stated age Attitude/Demeanor/Rapport: Engaged Affect (typically observed): Accepting Orientation: : Oriented to Self,Oriented to Place,Oriented to  Time,Oriented to Situation Alcohol / Substance Use: Not Applicable Psych Involvement: No (comment)  Admission diagnosis:  Dehydration [E86.0] Patient Active Problem List   Diagnosis Date Noted  . Generalized weakness   . Metastatic adenocarcinoma to pancreas ([place])   . Acute kidney injury superimposed on CKD ([place])   . Hyponatremia   . Anemia associated with chemotherapy   . Thrombocytopenia ([place])   . Hydronephrosis, left   . Dehydration 10/10/2020  . Family history of breast cancer   . Family history of pancreatic cancer   . Goals of care, counseling/discussion 05/01/2020  . Pancreatic adenocarcinoma ([place]) 05/01/2020  . Monoclonal gammopathy of unknown significance (MGUS) 04/04/2020  . Lumbar spondylosis 10/16/2019  . Type 2 diabetes mellitus with obesity ([place]) 08/01/2018  . Hx of iron deficiency anemia 07/26/2018  . Pedal edema 03/01/2017  . Temporal arteritis ([place]) 10/28/2016  . Cervical pain 10/06/2016  . Mixed anxiety and depressive disorder 04/20/2016  . Allergic rhinitis 02/26/2015  . H/O malignant neoplasm of breast 02/26/2015  . H/O malignant neoplasm of colon 02/26/2015  . GERD (gastroesophageal reflux disease) 02/26/2015  . Hypertensive CKD (chronic kidney disease) 02/26/2015  . CKD (  chronic kidney disease), stage III ([place]) 02/26/2015   PCP:  Delsa Grana, PA-C Pharmacy:   [id number] - [place], [place] - [address] [place] [address] Phone: [phone] Fax: [phone]     Social Determinants of Health (SDOH) Interventions    Readmission Risk Interventions No flowsheet data found.

## 2020-10-11 NOTE — NC FL2 (Signed)
[place] LEVEL OF CARE SCREENING TOOL     IDENTIFICATION  Patient Name: Veronica Colon Birthdate: 02-06-1932 Sex: female Admission Date (Current Location): 10/10/2020  [place] and [place] Number:  Engineering geologist and Address:  [facility], [address], [place], [place] [address]      Provider Number: [id number]  Attending Physician Name and Address:  Loletha Grayer, MD  Relative Name and Phone Number:  Katharina Caper [phone]    Current Level of Care: Hospital Recommended Level of Care: [place] Prior Approval Number:    Date Approved/Denied: 10/11/20 PASRR Number: [id number]  Discharge Plan: SNF    Current Diagnoses: Patient Active Problem List   Diagnosis Date Noted  . Generalized weakness   . Metastatic adenocarcinoma to pancreas ([place])   . Acute kidney injury superimposed on CKD ([place])   . Hyponatremia   . Anemia associated with chemotherapy   . Thrombocytopenia ([place])   . Hydronephrosis, left   . Dehydration 10/10/2020  . Family history of breast cancer   . Family history of pancreatic cancer   . Goals of care, counseling/discussion 05/01/2020  . Pancreatic adenocarcinoma ([place]) 05/01/2020  . Monoclonal gammopathy of unknown significance (MGUS) 04/04/2020  . Lumbar spondylosis 10/16/2019  . Type 2 diabetes mellitus with obesity ([place]) 08/01/2018  . Hx of iron deficiency anemia 07/26/2018  . Pedal edema 03/01/2017  . Temporal arteritis ([place]) 10/28/2016  . Cervical pain 10/06/2016  . Mixed anxiety and depressive disorder 04/20/2016  . Allergic rhinitis 02/26/2015  . H/O malignant neoplasm of breast 02/26/2015  . H/O malignant neoplasm of colon 02/26/2015  . GERD (gastroesophageal reflux disease) 02/26/2015  . Hypertensive CKD (chronic kidney disease) 02/26/2015  . CKD (chronic kidney disease), stage III ([place]) 02/26/2015    Orientation RESPIRATION BLADDER Height & Weight      Self,Time,Place,Situation    Incontinent Weight: 72.7 kg Height:  5\' 3"  (160 cm)  BEHAVIORAL SYMPTOMS/MOOD NEUROLOGICAL BOWEL NUTRITION STATUS      Continent Diet  AMBULATORY STATUS COMMUNICATION OF NEEDS Skin   Supervision Verbally Normal (BIlateral swelling)                       Personal Care Assistance Level of Assistance  Bathing,Feeding,Dressing Bathing Assistance: Limited assistance Feeding assistance: Limited assistance Dressing Assistance: Limited assistance     Functional Limitations Info  Hearing   Hearing Info: Impaired      SPECIAL CARE FACTORS FREQUENCY  PT (By licensed PT),OT (By licensed OT)     PT Frequency: 5X week OT Frequency: 5X week            Contractures      Additional Factors Info  Code Status (DNR) Code Status Info: DNR             Current Medications (10/11/2020):  This is the current hospital active medication list Current Facility-Administered Medications  Medication Dose Route Frequency Provider Last Rate Last Admin  . 0.9 %  sodium chloride infusion   Intravenous Continuous Loletha Grayer, MD 50 mL/hr at 10/11/20 0820 New Bag at 10/11/20 0820  . acetaminophen (TYLENOL) tablet 650 mg  650 mg Oral Q6H PRN Clarnce Flock, MD       Or  . acetaminophen (TYLENOL) suppository 650 mg  650 mg Rectal Q6H PRN Clarnce Flock, MD      . ascorbic acid (VITAMIN C) tablet 1,000 mg  1,000 mg Oral Once per day on Mon Wed  [place] Clarnce Flock, MD   1,000 mg at 10/10/20 2025  . aspirin EC tablet 81 mg  81 mg Oral Once per day on Mon Wed Fri Clarnce Flock, MD   81 mg at 10/10/20 2024  . calcium-vitamin D (OSCAL WITH D) 500-200 MG-UNIT per tablet 1 tablet  1 tablet Oral Daily Clarnce Flock, MD   1 tablet at 10/11/20 [phone]  . Chlorhexidine Gluconate Cloth 2 % PADS 6 each  6 each Topical Daily Clarnce Flock, MD      . enoxaparin (LOVENOX) injection 30 mg  30 mg Subcutaneous Q24H Clarnce Flock, MD   30 mg at 10/10/20  2023  . feeding supplement (ENSURE ENLIVE / ENSURE PLUS) liquid 237 mL  237 mL Oral BID BM Wieting, Richard, MD      . methocarbamol (ROBAXIN) 500 mg in dextrose 5 % 50 mL IVPB  500 mg Intravenous Q6H PRN Clarnce Flock, MD      . multivitamin with minerals tablet 1 tablet  1 tablet Oral Daily Clarnce Flock, MD   1 tablet at 10/11/20 [phone]  . ondansetron (ZOFRAN) tablet 4 mg  4 mg Oral Q6H PRN Clarnce Flock, MD       Or  . ondansetron [facility]) injection 4 mg  4 mg Intravenous Q6H PRN Clarnce Flock, MD      . oxyCODONE (Oxy IR/ROXICODONE) immediate release tablet 5 mg  5 mg Oral Q4H PRN Clarnce Flock, MD      . polyethylene glycol (MIRALAX / GLYCOLAX) packet 17 g  17 g Oral Daily PRN Clarnce Flock, MD      . prochlorperazine (COMPAZINE) tablet 10 mg  10 mg Oral Q6H PRN Clarnce Flock, MD      . spironolactone (ALDACTONE) tablet 50 mg  50 mg Oral Daily Clarnce Flock, MD   50 mg at 10/11/20 [id number]  . traZODone (DESYREL) tablet 25 mg  25 mg Oral QHS PRN Clarnce Flock, MD         Discharge Medications: Please see discharge summary for a list of discharge medications.  Relevant Imaging Results:  Relevant Lab Results:   Additional Information    Zigmund Daniel, Dorian Pod, RN

## 2020-10-11 NOTE — Evaluation (Signed)
Occupational Therapy Evaluation Patient Details Name: Veronica Colon MRN: [id number] DOB: 02/05/1932 Today's Date: 10/11/2020    History of Present Illness Pt is an 87 y.o. female with hx of metastatic pancreatic cancer currently undergoing palliative chemotherapy, history of breast and colon cancer in remission, type 2 diabetes, MGUS, CKD, who presents with profound weakness.  MD assessment includes: physical deconditioning, metastatic pancreatic cancer, AKI, elevated troponin, hyponatremia, and anemia.   Clinical Impression   Pt seen for OT evaluation this date.  Patient lives at home with her daughter, reports she has gotten weaker over the last few weeks and requiring increased assistance with self care and mobility.  Patient presents with muscle weakness, decreased transfers, mobility, balance and decreased ability to perform self care and IADL tasks.  Pt would benefit from skilled OT services to maximize her safety and independence in necessary daily tasks.     Follow Up Recommendations  SNF    Equipment Recommendations  3 in 1 bedside commode    Recommendations for Other Services       Precautions / Restrictions Precautions Precautions: Fall Restrictions Weight Bearing Restrictions: No      Mobility Bed Mobility Overal bed mobility: Needs Assistance Bed Mobility: Rolling;Sidelying to Sit;Sit to Sidelying Rolling: Min assist Sidelying to sit: Mod assist     Sit to sidelying: Mod assist      Transfers Overall transfer level: Needs assistance   Transfers: Sit to/from Stand Sit to Stand: Min assist;From elevated surface              Balance Overall balance assessment: Needs assistance Sitting-balance support: Feet unsupported;Bilateral upper extremity supported Sitting balance-Leahy Scale: Fair     Standing balance support: Bilateral upper extremity supported;During functional activity Standing balance-Leahy Scale: Fair                              ADL either performed or assessed with clinical judgement   ADL Overall ADL's : Needs assistance/impaired Eating/Feeding: Set up   Grooming: Set up;Bed level   Upper Body Bathing: Set up;Minimal assistance;Bed level   Lower Body Bathing: Maximal assistance;Bed level   Upper Body Dressing : Set up;Minimal assistance   Lower Body Dressing: Set up;Maximal assistance                 General ADL Comments: Pt able to sit EOB for 8 mins to perform grooming tasks this am with setup     Vision         Perception     Praxis      Pertinent Vitals/Pain Pain Assessment: No/denies pain Pain Score: 0-No pain     Hand Dominance Right   Extremity/Trunk Assessment Upper Extremity Assessment Upper Extremity Assessment: Generalized weakness   Lower Extremity Assessment Lower Extremity Assessment: Defer to PT evaluation       Communication Communication Communication: No difficulties   Cognition Arousal/Alertness: Awake/alert Behavior During Therapy: WFL for tasks assessed/performed Overall Cognitive Status: Within Functional Limits for tasks assessed                                     General Comments       Exercises     Shoulder Instructions      Home Living Family/patient expects to be discharged to:: Private residence Living Arrangements: Children Available Help at Discharge: Family;Available 24 hours/day Type of  Home: House Home Access: Level entry     Home Layout: One level     Bathroom Shower/Tub: Walk-in shower;Door   Bathroom Toilet: Handicapped height     Home Equipment: Bedside commode;Walker - 4 wheels;Cane - single point;Shower seat - built in   Additional Comments: Pt lives with dtr and SIL      Prior Functioning/Environment Level of Independence: Independent with assistive device(s)        Comments: Mod Ind amb with a SPC community distances but declining over the last several weeks, recently using a rollator for  limited ambulation; no fall history, Assist from family with ADLs        OT Problem List: Decreased strength;Impaired balance (sitting and/or standing);Decreased range of motion;Decreased activity tolerance;Decreased knowledge of use of DME or AE      OT Treatment/Interventions: Self-care/ADL training;DME and/or AE instruction;Therapeutic activities;Balance training;Therapeutic exercise;Patient/family education    OT Goals(Current goals can be found in the care plan section) Acute Rehab OT Goals Patient Stated Goal: to get stronger and do more for myself OT Goal Formulation: With patient Time For Goal Achievement: 10/25/20 Potential to Achieve Goals: Fair ADL Goals Pt Will Perform Lower Body Dressing: (P) with set-up;with min assist Pt Will Transfer to Toilet: (P) with min guard assist Additional ADL Goal #1: (P) Pt to increase bilateral UE strength by 1 mm grade to assist with completing ADL tasks  OT Frequency: Min 1X/week   Barriers to D/C:            Co-evaluation              AM-PAC OT "6 Clicks" Daily Activity     Outcome Measure Help from another person eating meals?: A Little Help from another person taking care of personal grooming?: A Little Help from another person toileting, which includes using toliet, bedpan, or urinal?: A Lot Help from another person bathing (including washing, rinsing, drying)?: A Lot Help from another person to put on and taking off regular upper body clothing?: A Little Help from another person to put on and taking off regular lower body clothing?: A Lot 6 Click Score: 15   End of Session Equipment Utilized During Treatment: Gait belt  Activity Tolerance: Patient tolerated treatment well Patient left: in bed;with call bell/phone within reach;with bed alarm set  OT Visit Diagnosis: Unsteadiness on feet (R26.81);Muscle weakness (generalized) (M62.81)                Time: 0936-1000 OT Time Calculation (min): 24 min Charges:  OT  General Charges $OT Visit: 1 Visit OT Evaluation $OT Eval Low Complexity: 1 Low  Veronica Colon, OTR/L, CLT   Veronica Colon 10/11/2020, 10:13 AM

## 2020-10-11 NOTE — Progress Notes (Signed)
Patient ID: Veronica Colon, female   DOB: 1932-04-30, 87 y.o.   MRN: [id number] [facility] PROGRESS NOTE  Kambryn Dapolito Manship RCV:[id number] DOB: April 21, 1932 DOA: 10/10/2020 PCP: Delsa Grana, PA-C  HPI/Subjective: Patient presenting with the inability to walk and weakness.  No abdominal pain.  No nausea or vomiting.  Objective: Vitals:   10/11/20 0428 10/11/20 0827  BP: 132/69 138/76  Pulse: 86 92  Resp: 15 18  Temp: 97.6 F (36.4 C) (!) 97.4 F (36.3 C)  SpO2: 100% 100%    Intake/Output Summary (Last 24 hours) at 10/11/2020 1318 Last data filed at 10/11/2020 1017 Gross per 24 hour  Intake 1542.88 ml  Output 400 ml  Net 1142.88 ml   Filed Weights   10/10/20 1058 10/10/20 1715  Weight: 63.5 kg 72.7 kg    ROS: Review of Systems  Constitutional: Positive for malaise/fatigue.  Respiratory: Negative for shortness of breath.   Cardiovascular: Negative for chest pain.  Gastrointestinal: Negative for abdominal pain, nausea and vomiting.   Exam: Physical Exam HENT:     Head: Normocephalic.     Mouth/Throat:     Pharynx: No oropharyngeal exudate.  Eyes:     General: Lids are normal.     Conjunctiva/sclera: Conjunctivae normal.     Pupils: Pupils are equal, round, and reactive to light.  Cardiovascular:     Rate and Rhythm: Normal rate and regular rhythm.     Heart sounds: Normal heart sounds, S1 normal and S2 normal.  Pulmonary:     Breath sounds: Normal breath sounds. No decreased breath sounds, wheezing, rhonchi or rales.  Abdominal:     Palpations: Abdomen is soft.     Tenderness: There is no abdominal tenderness.  Musculoskeletal:     Right lower leg: Swelling present.     Left lower leg: Swelling present.  Skin:    General: Skin is warm.     Findings: No rash.  Neurological:     Mental Status: She is alert and oriented to person, place, and time.     Comments: Barely able to straight leg raise bilaterally.       Data Reviewed: Basic Metabolic Panel: Recent  Labs  Lab 10/08/20 0818 10/10/20 1226 10/11/20 0756  NA 130* 128* 124*  K 3.9 4.3 4.6  CL 101 101 98  CO2 20* 18* 18*  GLUCOSE 150* 124* 95  BUN 26* 40* 36*  CREATININE 1.65* 1.72* 1.52*  CALCIUM 8.8* 8.5* 8.3*   Liver Function Tests: Recent Labs  Lab 10/08/20 0818 10/10/20 1226 10/11/20 0756  AST 96* 45* 38  ALT 68* 42 31  ALKPHOS 257* 202* 173*  BILITOT 1.6* 1.9* 2.1*  PROT 5.9* 5.5* 5.2*  ALBUMIN 2.7* 2.5* 2.3*   Recent Labs  Lab 10/10/20 1226  LIPASE 24   CBC: Recent Labs  Lab 10/08/20 0818 10/10/20 1226 10/11/20 0756  WBC 3.7* 7.4 8.3  NEUTROABS 2.3 7.0  --   HGB 7.9* 8.9* 8.4*  HCT 23.5* 25.9* 25.4*  MCV 92.2 89.6 90.7  PLT 200 146* 128*     Recent Results (from the past 240 hour(s))  Resp Panel by RT-PCR (Flu A&B, Covid) Nasopharyngeal Swab     Status: None   Collection Time: 10/10/20  2:07 PM   Specimen: Nasopharyngeal Swab; Nasopharyngeal(NP) swabs in vial transport medium  Result Value Ref Range Status   SARS Coronavirus 2 by RT PCR NEGATIVE NEGATIVE Final    Comment: (NOTE) SARS-CoV-2 target nucleic acids are NOT DETECTED.  The SARS-CoV-2 RNA is generally detectable in upper respiratory specimens during the acute phase of infection. The lowest concentration of SARS-CoV-2 viral copies this assay can detect is 138 copies/mL. A negative result does not preclude SARS-Cov-2 infection and should not be used as the sole basis for treatment or other patient management decisions. A negative result may occur with  improper specimen collection/handling, submission of specimen other than nasopharyngeal swab, presence of viral mutation(s) within the areas targeted by this assay, and inadequate number of viral copies(<138 copies/mL). A negative result must be combined with clinical observations, patient history, and epidemiological information. The expected result is Negative.  Fact Sheet for Patients:   EntrepreneurPulse.com.au  Fact Sheet for Healthcare Providers:  IncredibleEmployment.be  This test is no t yet approved or cleared by the [place] FDA and  has been authorized for detection and/or diagnosis of SARS-CoV-2 by FDA under an Emergency Use Authorization (EUA). This EUA will remain  in effect (meaning this test can be used) for the duration of the COVID-19 declaration under Section 564(b)(1) of the Act, 21 U.S.C.section 360bbb-3(b)(1), unless the authorization is terminated  or revoked sooner.       Influenza A by PCR NEGATIVE NEGATIVE Final   Influenza B by PCR NEGATIVE NEGATIVE Final    Comment: (NOTE) The Xpert Xpress SARS-CoV-2/FLU/RSV plus assay is intended as an aid in the diagnosis of influenza from Nasopharyngeal swab specimens and should not be used as a sole basis for treatment. Nasal washings and aspirates are unacceptable for Xpert Xpress SARS-CoV-2/FLU/RSV testing.  Fact Sheet for Patients: EntrepreneurPulse.com.au  Fact Sheet for Healthcare Providers: IncredibleEmployment.be  This test is not yet approved or cleared by the [place] FDA and has been authorized for detection and/or diagnosis of SARS-CoV-2 by FDA under an Emergency Use Authorization (EUA). This EUA will remain in effect (meaning this test can be used) for the duration of the COVID-19 declaration under Section 564(b)(1) of the Act, 21 U.S.C. section 360bbb-3(b)(1), unless the authorization is terminated or revoked.  Performed at [facility], [address], [place], [place] [address]      Studies: DG Chest Portable 1 View  Result Date: 10/10/2020 CLINICAL DATA:  Weakness. EXAM: PORTABLE CHEST 1 VIEW COMPARISON:  August 07, 2018. FINDINGS: The heart size and mediastinal contours are within normal limits. No pneumothorax or significant pleural effusion is noted. Right internal jugular  Port-A-Cath is noted with distal tip in expected position of cavoatrial junction. Hypoinflation of the lungs is noted with minimal bibasilar subsegmental atelectasis. The visualized skeletal structures are unremarkable. IMPRESSION: Hypoinflation of the lungs with minimal bibasilar subsegmental atelectasis. Electronically Signed   By: Marijo Conception M.D.   On: 10/10/2020 13:10    Scheduled Meds: . ascorbic acid  1,000 mg Oral Once per day on Mon Wed Fri  . aspirin EC  81 mg Oral Once per day on Mon Wed Fri  . calcium-vitamin D  1 tablet Oral Daily  . Chlorhexidine Gluconate Cloth  6 each Topical Daily  . enoxaparin (LOVENOX) injection  30 mg Subcutaneous Q24H  . multivitamin with minerals  1 tablet Oral Daily  . spironolactone  50 mg Oral Daily   Continuous Infusions: . sodium chloride 50 mL/hr at 10/11/20 0820  . methocarbamol (ROBAXIN) IV      Assessment/Plan:  1. Generalized weakness with the inability to walk.  Physical therapy recommends rehab.  Continue working with PT here. 2. Metastatic pancreatic adenocarcinoma.  Follows with Dr. Grayland Ormond as outpatient.  May need to have a chemotherapy holiday.  Reviewed PET scan on 09/24/2020 that shows hypermetabolic left retroperitoneal and para-aortic lesions, slight decrease in the known pancreatic lesion and also omental disease.  Patient also has ascites.  Will consider paracentesis on Monday.  Palliative care consult on Monday 3. Acute kidney injury on chronic kidney disease stage IIIb.  Creatinine 1.72 on presentation and down to 1.52 with IV fluid hydration. 4. Hyponatremia.  Sodium on the lower side despite IV fluid hydration.  Will start ensures.  Likely poor appetite contributing.  Send off urine osmolarity and urine sodium 5. Anemia and thrombocytopenia from chemotherapy. 6. Borderline elevated troponin secondary to acute kidney injury and demand ischemia. 7. Chronic left hydronephrosis 8. Diet-controlled diabetes mellitus.  Add on a  hemoglobin A1c 9. History of temporal arteritis in the past        Code Status:     Code Status Orders  (From admission, onward)         Start     Ordered   10/10/20 1719  Do not attempt resuscitation (DNR)  Continuous       Question Answer Comment  In the event of cardiac or respiratory ARREST Do not call a "code blue"   In the event of cardiac or respiratory ARREST Do not perform Intubation, CPR, defibrillation or ACLS   In the event of cardiac or respiratory ARREST Use medication by any route, position, wound care, and other measures to relive pain and suffering. May use oxygen, suction and manual treatment of airway obstruction as needed for comfort.      10/10/20 1718        Code Status History    This patient has a current code status but no historical code status.   Advance Care Planning Activity     Family Communication: Spoke with daughter on the phone Disposition Plan: Status is: Inpatient  Dispo: The patient is from: Home              Anticipated d/c is to: Rehab              Patient currently being treated for weakness and hyponatremia   Difficult to place patient.  Hopefully not.  Time spent: 28 minutes  [place]

## 2020-10-12 DIAGNOSIS — N179 Acute kidney failure, unspecified: Secondary | ICD-10-CM | POA: Diagnosis not present

## 2020-10-12 DIAGNOSIS — R7301 Impaired fasting glucose: Secondary | ICD-10-CM

## 2020-10-12 DIAGNOSIS — R531 Weakness: Secondary | ICD-10-CM | POA: Diagnosis not present

## 2020-10-12 DIAGNOSIS — E871 Hypo-osmolality and hyponatremia: Secondary | ICD-10-CM | POA: Diagnosis not present

## 2020-10-12 DIAGNOSIS — C7889 Secondary malignant neoplasm of other digestive organs: Secondary | ICD-10-CM | POA: Diagnosis not present

## 2020-10-12 LAB — COMPREHENSIVE METABOLIC PANEL
ALT: 39 U/L (ref 0–44)
AST: 86 U/L — ABNORMAL HIGH (ref 15–41)
Albumin: 2.1 g/dL — ABNORMAL LOW (ref 3.5–5.0)
Alkaline Phosphatase: 354 U/L — ABNORMAL HIGH (ref 38–126)
Anion gap: 8 (ref 5–15)
BUN: 41 mg/dL — ABNORMAL HIGH (ref 8–23)
CO2: 18 mmol/L — ABNORMAL LOW (ref 22–32)
Calcium: 8.1 mg/dL — ABNORMAL LOW (ref 8.9–10.3)
Chloride: 99 mmol/L (ref 98–111)
Creatinine, Ser: 1.5 mg/dL — ABNORMAL HIGH (ref 0.44–1.00)
GFR, Estimated: 34 mL/min — ABNORMAL LOW (ref >60)
Glucose, Bld: 105 mg/dL — ABNORMAL HIGH (ref 70–99)
Potassium: 4.6 mmol/L (ref 3.5–5.1)
Sodium: 125 mmol/L — ABNORMAL LOW (ref 135–145)
Total Bilirubin: 5.4 mg/dL — ABNORMAL HIGH (ref 0.3–1.2)
Total Protein: 5 g/dL — ABNORMAL LOW (ref 6.5–8.1)

## 2020-10-12 LAB — CBC
HCT: 23.7 % — ABNORMAL LOW (ref 36.0–46.0)
Hemoglobin: 8 g/dL — ABNORMAL LOW (ref 12.0–15.0)
MCH: 30.5 pg (ref 26.0–34.0)
MCHC: 33.8 g/dL (ref 30.0–36.0)
MCV: 90.5 fL (ref 80.0–100.0)
Platelets: 116 10*3/uL — ABNORMAL LOW (ref 150–400)
RBC: 2.62 MIL/uL — ABNORMAL LOW (ref 3.87–5.11)
RDW: 17.9 % — ABNORMAL HIGH (ref 11.5–15.5)
WBC: 7 10*3/uL (ref 4.0–10.5)
nRBC: 0 % (ref 0.0–0.2)

## 2020-10-12 MED ORDER — ALBUMIN HUMAN 25 % IV SOLN
12.5000 g | Freq: Two times a day (BID) | INTRAVENOUS | Status: DC
  Administered 2020-10-12 – 2020-10-13 (×3): 12.5 g via INTRAVENOUS
  Filled 2020-10-12 (×5): qty 50

## 2020-10-12 MED ORDER — MIDODRINE HCL 5 MG PO TABS
5.0000 mg | ORAL_TABLET | Freq: Three times a day (TID) | ORAL | Status: DC
  Administered 2020-10-12 – 2020-10-22 (×26): 5 mg via ORAL
  Filled 2020-10-12 (×28): qty 1

## 2020-10-12 MED ORDER — POLYETHYLENE GLYCOL 3350 17 G PO PACK
17.0000 g | PACK | Freq: Every day | ORAL | Status: DC
  Administered 2020-10-12 – 2020-10-13 (×2): 17 g via ORAL
  Filled 2020-10-12 (×2): qty 1

## 2020-10-12 MED ORDER — NYSTATIN 100000 UNIT/ML MT SUSP
5.0000 mL | Freq: Four times a day (QID) | OROMUCOSAL | Status: DC
  Administered 2020-10-12 – 2020-10-22 (×40): 500000 [IU] via ORAL
  Filled 2020-10-12 (×38): qty 5

## 2020-10-12 MED ORDER — FUROSEMIDE 10 MG/ML IJ SOLN
40.0000 mg | Freq: Two times a day (BID) | INTRAMUSCULAR | Status: DC
  Administered 2020-10-12 – 2020-10-14 (×4): 40 mg via INTRAVENOUS
  Filled 2020-10-12 (×4): qty 4

## 2020-10-12 NOTE — Progress Notes (Addendum)
Patient ID: Veronica Colon, female   DOB: 06/02/1932, 87 y.o.   MRN: [id number] [facility] PROGRESS NOTE  Sharlene Mccluskey Fletchall UXN:[id number] DOB: [birth year] DOA: 10/10/2020 PCP: Delsa Grana, PA-C  HPI/Subjective: Patient still feels very weak.  Able to straight leg raise.  Still with some poor appetite.  Abdomen swollen.  No pain.  Objective: Vitals:   10/12/20 0826 10/12/20 1102  BP: 133/73 135/77  Pulse: 86 82  Resp: 20 20  Temp: 99.4 F (37.4 C) 98.3 F (36.8 C)  SpO2: 99% 98%    Intake/Output Summary (Last 24 hours) at 10/12/2020 1311 Last data filed at 10/12/2020 1034 Gross per 24 hour  Intake 1080 ml  Output 650 ml  Net 430 ml   Filed Weights   10/10/20 1058 10/10/20 1715  Weight: 63.5 kg 72.7 kg    ROS: Review of Systems  Respiratory: Negative for shortness of breath.   Cardiovascular: Negative for chest pain.  Gastrointestinal: Positive for constipation. Negative for abdominal pain, nausea and vomiting.   Exam: Physical Exam HENT:     Head: Normocephalic.     Comments: Thrush on tongue    Mouth/Throat:     Pharynx: No oropharyngeal exudate.  Eyes:     General: Lids are normal.     Conjunctiva/sclera: Conjunctivae normal.  Cardiovascular:     Rate and Rhythm: Normal rate and regular rhythm.     Heart sounds: Normal heart sounds, S1 normal and S2 normal.  Pulmonary:     Breath sounds: Normal breath sounds. No decreased breath sounds, wheezing, rhonchi or rales.  Abdominal:     General: There is distension.     Palpations: Abdomen is soft.     Tenderness: There is no abdominal tenderness.  Musculoskeletal:     Right lower leg: Swelling present.     Left lower leg: Swelling present.  Skin:    General: Skin is warm.     Findings: No rash.  Neurological:     Mental Status: She is alert and oriented to person, place, and time.     Comments: Able to straight leg raise bilaterally.       Data Reviewed: Basic Metabolic Panel: Recent Labs  Lab  10/08/20 0818 10/10/20 1226 10/11/20 0756 10/12/20 0500  NA 130* 128* 124* 125*  K 3.9 4.3 4.6 4.6  CL 101 101 98 99  CO2 20* 18* 18* 18*  GLUCOSE 150* 124* 95 105*  BUN 26* 40* 36* 41*  CREATININE 1.65* 1.72* 1.52* 1.50*  CALCIUM 8.8* 8.5* 8.3* 8.1*   Liver Function Tests: Recent Labs  Lab 10/08/20 0818 10/10/20 1226 10/11/20 0756 10/12/20 0500  AST 96* 45* 38 86*  ALT 68* 42 31 39  ALKPHOS 257* 202* 173* 354*  BILITOT 1.6* 1.9* 2.1* 5.4*  PROT 5.9* 5.5* 5.2* 5.0*  ALBUMIN 2.7* 2.5* 2.3* 2.1*   Recent Labs  Lab 10/10/20 1226  LIPASE 24   CBC: Recent Labs  Lab 10/08/20 0818 10/10/20 1226 10/11/20 0756 10/12/20 0500  WBC 3.7* 7.4 8.3 7.0  NEUTROABS 2.3 7.0  --   --   HGB 7.9* 8.9* 8.4* 8.0*  HCT 23.5* 25.9* 25.4* 23.7*  MCV 92.2 89.6 90.7 90.5  PLT 200 146* 128* 116*     Recent Results (from the past 240 hour(s))  Resp Panel by RT-PCR (Flu A&B, Covid) Nasopharyngeal Swab     Status: None   Collection Time: 10/10/20  2:07 PM   Specimen: Nasopharyngeal Swab; Nasopharyngeal(NP) swabs in vial transport  medium  Result Value Ref Range Status   SARS Coronavirus 2 by RT PCR NEGATIVE NEGATIVE Final    Comment: (NOTE) SARS-CoV-2 target nucleic acids are NOT DETECTED.  The SARS-CoV-2 RNA is generally detectable in upper respiratory specimens during the acute phase of infection. The lowest concentration of SARS-CoV-2 viral copies this assay can detect is 138 copies/mL. A negative result does not preclude SARS-Cov-2 infection and should not be used as the sole basis for treatment or other patient management decisions. A negative result may occur with  improper specimen collection/handling, submission of specimen other than nasopharyngeal swab, presence of viral mutation(s) within the areas targeted by this assay, and inadequate number of viral copies(<138 copies/mL). A negative result must be combined with clinical observations, patient history, and  epidemiological information. The expected result is Negative.  Fact Sheet for Patients:  EntrepreneurPulse.com.au  Fact Sheet for Healthcare Providers:  IncredibleEmployment.be  This test is no t yet approved or cleared by the [place] FDA and  has been authorized for detection and/or diagnosis of SARS-CoV-2 by FDA under an Emergency Use Authorization (EUA). This EUA will remain  in effect (meaning this test can be used) for the duration of the COVID-19 declaration under Section 564(b)(1) of the Act, 21 U.S.C.section 360bbb-3(b)(1), unless the authorization is terminated  or revoked sooner.       Influenza A by PCR NEGATIVE NEGATIVE Final   Influenza B by PCR NEGATIVE NEGATIVE Final    Comment: (NOTE) The Xpert Xpress SARS-CoV-2/FLU/RSV plus assay is intended as an aid in the diagnosis of influenza from Nasopharyngeal swab specimens and should not be used as a sole basis for treatment. Nasal washings and aspirates are unacceptable for Xpert Xpress SARS-CoV-2/FLU/RSV testing.  Fact Sheet for Patients: EntrepreneurPulse.com.au  Fact Sheet for Healthcare Providers: IncredibleEmployment.be  This test is not yet approved or cleared by the [place] FDA and has been authorized for detection and/or diagnosis of SARS-CoV-2 by FDA under an Emergency Use Authorization (EUA). This EUA will remain in effect (meaning this test can be used) for the duration of the COVID-19 declaration under Section 564(b)(1) of the Act, 21 U.S.C. section 360bbb-3(b)(1), unless the authorization is terminated or revoked.  Performed at [facility], [address] [place], [place] [address]       Scheduled Meds: . ascorbic acid  1,000 mg Oral Once per day on Mon Wed Fri  . aspirin EC  81 mg Oral Once per day on Mon Wed Fri  . calcium-vitamin D  1 tablet Oral Daily  . Chlorhexidine Gluconate Cloth  6 each  Topical Daily  . enoxaparin (LOVENOX) injection  30 mg Subcutaneous Q24H  . feeding supplement  237 mL Oral BID BM  . furosemide  40 mg Intravenous BID  . midodrine  5 mg Oral TID WC  . multivitamin with minerals  1 tablet Oral Daily  . nystatin  5 mL Oral QID  . polyethylene glycol  17 g Oral Daily  . spironolactone  50 mg Oral Daily   Continuous Infusions: . albumin human    . methocarbamol (ROBAXIN) IV      Assessment/Plan:  1. Generalized weakness with the inability to walk.  Physical therapy recommending rehab.  Continue working with physical therapy here. 2. Metastatic pancreatic adenocarcinoma.  Patient follows with Dr. Grayland Ormond as outpatient.  Recent PET scan on 09/24/2020 shows hypermetabolic left retroperitoneal and para-aortic lesions, also pancreatic lesion and also omental disease and ascites.  Family did not want to  do a paracentesis.  Palliative care evaluation 3. Hyponatremia with a very low urine sodium.  Discontinued IV fluids.  We will give IV albumin and IV Lasix twice a day.  Midodrine to keep blood pressure higher. 4. Acute kidney injury on chronic kidney disease stage IIIb.  Creatinine 1.72 on presentation and down to 1.5 with IV fluids. 5. Anemia and thrombocytopenia secondary to chemotherapy 6. Chronic left-sided hydronephrosis 7. Impaired fasting glucose.  Hemoglobin A1c 5.9 8. History of temporal arteritis 9. Constipation.  Start MiraLAX 10. Thrush on tongue.  Start nystatin swish and swallow.        Code Status:     Code Status Orders  (From admission, onward)         Start     Ordered   10/10/20 1719  Do not attempt resuscitation (DNR)  Continuous       Question Answer Comment  In the event of cardiac or respiratory ARREST Do not call a "code blue"   In the event of cardiac or respiratory ARREST Do not perform Intubation, CPR, defibrillation or ACLS   In the event of cardiac or respiratory ARREST Use medication by any route, position, wound  care, and other measures to relive pain and suffering. May use oxygen, suction and manual treatment of airway obstruction as needed for comfort.      10/10/20 1718        Code Status History    This patient has a current code status but no historical code status.   Advance Care Planning Activity     Family Communication: Daughter at the bedside Disposition Plan: Status is: Inpatient  Dispo: The patient is from: Home              Anticipated d/c is to: Rehab              Patient currently with hyponatremia and switching therapy today to IV Lasix and IV albumin   Difficult to place patient.  Hopefully not.  Time spent: 28 minutes  [place]

## 2020-10-13 DIAGNOSIS — R531 Weakness: Secondary | ICD-10-CM | POA: Diagnosis not present

## 2020-10-13 DIAGNOSIS — N179 Acute kidney failure, unspecified: Secondary | ICD-10-CM | POA: Diagnosis not present

## 2020-10-13 DIAGNOSIS — C259 Malignant neoplasm of pancreas, unspecified: Secondary | ICD-10-CM | POA: Diagnosis not present

## 2020-10-13 DIAGNOSIS — N189 Chronic kidney disease, unspecified: Secondary | ICD-10-CM

## 2020-10-13 DIAGNOSIS — B370 Candidal stomatitis: Secondary | ICD-10-CM

## 2020-10-13 DIAGNOSIS — Z515 Encounter for palliative care: Secondary | ICD-10-CM | POA: Diagnosis not present

## 2020-10-13 DIAGNOSIS — M316 Other giant cell arteritis: Secondary | ICD-10-CM

## 2020-10-13 DIAGNOSIS — D61818 Other pancytopenia: Secondary | ICD-10-CM | POA: Diagnosis not present

## 2020-10-13 DIAGNOSIS — E871 Hypo-osmolality and hyponatremia: Secondary | ICD-10-CM | POA: Diagnosis not present

## 2020-10-13 LAB — BASIC METABOLIC PANEL
Anion gap: 8 (ref 5–15)
BUN: 47 mg/dL — ABNORMAL HIGH (ref 8–23)
CO2: 18 mmol/L — ABNORMAL LOW (ref 22–32)
Calcium: 8.4 mg/dL — ABNORMAL LOW (ref 8.9–10.3)
Chloride: 100 mmol/L (ref 98–111)
Creatinine, Ser: 1.64 mg/dL — ABNORMAL HIGH (ref 0.44–1.00)
GFR, Estimated: 30 mL/min — ABNORMAL LOW (ref >60)
Glucose, Bld: 108 mg/dL — ABNORMAL HIGH (ref 70–99)
Potassium: 4.4 mmol/L (ref 3.5–5.1)
Sodium: 126 mmol/L — ABNORMAL LOW (ref 135–145)

## 2020-10-13 LAB — CBC
HCT: 23.3 % — ABNORMAL LOW (ref 36.0–46.0)
Hemoglobin: 8.1 g/dL — ABNORMAL LOW (ref 12.0–15.0)
MCH: 30.5 pg (ref 26.0–34.0)
MCHC: 34.8 g/dL (ref 30.0–36.0)
MCV: 87.6 fL (ref 80.0–100.0)
Platelets: 86 10*3/uL — ABNORMAL LOW (ref 150–400)
RBC: 2.66 MIL/uL — ABNORMAL LOW (ref 3.87–5.11)
RDW: 17.7 % — ABNORMAL HIGH (ref 11.5–15.5)
WBC: 2.2 10*3/uL — ABNORMAL LOW (ref 4.0–10.5)
nRBC: 0 % (ref 0.0–0.2)

## 2020-10-13 MED ORDER — POLYETHYLENE GLYCOL 3350 17 G PO PACK
17.0000 g | PACK | Freq: Every day | ORAL | Status: DC | PRN
Start: 2020-10-13 — End: 2020-10-22

## 2020-10-13 MED ORDER — BOOST / RESOURCE BREEZE PO LIQD CUSTOM
1.0000 | Freq: Three times a day (TID) | ORAL | Status: DC
  Administered 2020-10-13: 1 via ORAL

## 2020-10-13 NOTE — Progress Notes (Signed)
Initial Nutrition Assessment  DOCUMENTATION CODES:   Not applicable  INTERVENTION:   -D/c Ensure Enlive po BID, each supplement provides 350 kcal and 20 grams of protein -Boost Breeze po TID, each supplement provides 250 kcal and 9 grams of protein -MVI with minerals daily  NUTRITION DIAGNOSIS:   Increased nutrient needs related to chronic illness (metasatic pancreatic cancer) as evidenced by estimated needs.  GOAL:   Patient will meet greater than or equal to 90% of their needs  MONITOR:   PO intake,Supplement acceptance,Labs,Weight trends,Skin,I & O's  REASON FOR ASSESSMENT:   Consult Assessment of nutrition requirement/status  ASSESSMENT:   Veronica Colon is a 87 y.o. female with hx of metastatic pancreatic cancer currently undergoing palliative chemotherapy, history of breast and colon cancer in remission, type 2 diabetes, MGUS, CKD, who presents with profound weakness.  Pt admitted with physical deconditioning and metastatic pancreatic cancer.   Reviewed I/O's: -877 ml x 24 hours and +456 ml since admission  UOP: 1.4 L x 24 hours  Spoke with pt and daughter at bedside. Pt is hard of hearing, however, unable to communicate when spoken to loudly and clearly. Per pt, she has a poor appetite and did not each much breakfast today. Pt shares she has had a poor appetite over the past several months. Noted meal completion 50%.   Reviewed wt hx; wt has been stable over the past year.   Exam was ended prematurely due to MD needing to assess pt. Suspect pt with malnutrition, however, unable to identify at this time.   Per RN, pt does not Ensure Enlive supplements, as they bother her stomach.   Medications reviewed and include vitamin C, calcium with vitamin D, IV albumin, and miralax.   Labs reviewed: Na: 126.   Diet Order:   Diet Order            DIET DYS 3 Room service appropriate? Yes; Fluid consistency: Thin  Diet effective now                 EDUCATION  NEEDS:   No education needs have been identified at this time  Skin:  Skin Assessment: Reviewed RN Assessment  Last BM:  10/12/20  Height:   Ht Readings from Last 1 Encounters:  10/10/20 5\' 3"  (1.6 m)    Weight:   Wt Readings from Last 1 Encounters:  10/10/20 72.7 kg    Ideal Body Weight:  52.3 kg  BMI:  Body mass index is 28.39 kg/m.  Estimated Nutritional Needs:   Kcal:  1650-1850  Protein:  80-95 grams  Fluid:  > 1.6 L    Loistine Chance, RD, LDN, [place] Registered Dietitian II Certified Diabetes Care and Education Specialist Please refer to [facility] for RD and/or RD on-call/weekend/after hours pager

## 2020-10-13 NOTE — TOC Progression Note (Signed)
Transition of Care [facility]) - Progression Note    Patient Details  Name: STANISLAWA GAFFIN MRN: [id number] Date of Birth: 04/16/1932  Transition of Care [facility]) CM/SW [place], RN Phone Number: 10/13/2020, 4:39 PM  Clinical Narrative:   Spoke to patient's daughter re: only bed offered was [facility] in [place], Daughter would like local, and [facility] will respond tomorrow.  Patient has been vaccinated x 2 for COVID, no booster as patient began chemo.  Daughter will contact TOC tomorrow, and will discuss other responses.  No further questions from daughter at this time, TOC to follow through discharge.    Expected Discharge Plan: Skilled Nursing Facility Barriers to Discharge: Other (comment) (Pt continue ongoing chemotherapy)  Expected Discharge Plan and Services Expected Discharge Plan: [place]   Discharge Planning Services: CM Consult   Living arrangements for the past 2 months: Single Family Home (Lives with her daughter)                                       Social Determinants of Health (SDOH) Interventions    Readmission Risk Interventions No flowsheet data found.

## 2020-10-13 NOTE — Consult Note (Signed)
[place]  Telephone:([phone] [phone]   Name: Veronica Colon Date: 10/13/2020 MRN: [id number]  DOB: 12-14-1931  Patient Care Team: Delsa Grana, PA-C as PCP - General (Family Medicine) Marlowe Sax, MD as Referring Physician (Internal Medicine) Birder Robson, MD as Referring Physician (Ophthalmology) Lloyd Huger, MD as Consulting Physician (Hematology and Oncology)    REASON FOR CONSULTATION: Veronica Colon is a 88 y.o. female with multiple medical problems including metastatic pancreatic cancer on systemic chemotherapy with gemcitabine, history of breast and colon cancer in remission, type 2 diabetes, MGUS, and CKD who was admitted on 10/10/2020 with progressive weakness.  Palliative care was consulted help address goals.  SOCIAL HISTORY:     reports that she has never smoked. She has never used smokeless tobacco. She reports that she does not drink alcohol and does not use drugs.  Patient is widowed.  She lives at home with her daughter.  Patient previously worked in [employer].  ADVANCE DIRECTIVES:  None on file  CODE STATUS: DNR  PAST MEDICAL HISTORY: Past Medical History:  Diagnosis Date  . Allergy   . Anemia associated with chemotherapy   . Anxiety and depression   . Anxiety and depression   . Arthritis   . Breast cancer ([place]) 1990   LT MASTECTOMY  . Cancer ([place]) 1990   LT BREAST MASTECTOMY  . CKD (chronic kidney disease), stage III ([place])   . Diabetes mellitus without complication ([place])   . Family history of breast cancer   . Family history of pancreatic cancer   . GERD (gastroesophageal reflux disease)   . Hypertension   . Hypertensive CKD (chronic kidney disease)   . Personal history of chemotherapy   . Status post chemotherapy    breast cancer  . Temporal arteritis ([place]) 10/28/2016   Overview:  Right temporal arteritis  Biopsy proven 11/08/2016    PAST SURGICAL HISTORY:  Past  Surgical History:  Procedure Laterality Date  . ABDOMINAL HYSTERECTOMY    . BREAST SURGERY    . COLONOSCOPY WITH PROPOFOL N/A 06/04/2015   Procedure: COLONOSCOPY WITH PROPOFOL;  Surgeon: Manya Silvas, MD;  Location[facility] ENDOSCOPY;  Service: Endoscopy;  Laterality: N/A;  . IR IMAGING GUIDED PORT INSERTION  05/09/2020  . MASTECTOMY Left 1990   BREAST CA  . TEMPORAL ARTERY BIOPSY / LIGATION  11/2016    HEMATOLOGY/ONCOLOGY HISTORY:  Oncology History  Primary pancreatic cancer with metastasis to other site [facility])  05/01/2020 Initial Diagnosis   Pancreatic adenocarcinoma ([place])   05/12/2020 -  Chemotherapy    Patient is on Treatment Plan: PANCREAS GEMCITABINE D1,8 Q21D X 6 CYCLES      05/18/2020 Cancer Staging   Staging form: Exocrine Pancreas, AJCC 8th Edition - Clinical stage from 05/18/2020: Stage IV (cTX, cN2, pM1) - Signed by Lloyd Huger, MD on 05/18/2020     ALLERGIES:  is allergic to hydrochlorothiazide, lisinopril, and metformin and related.  MEDICATIONS:  Current Facility-Administered Medications  Medication Dose Route Frequency Provider Last Rate Last Admin  . acetaminophen (TYLENOL) tablet 650 mg  650 mg Oral Q6H PRN Clarnce Flock, MD       Or  . acetaminophen (TYLENOL) suppository 650 mg  650 mg Rectal Q6H PRN Clarnce Flock, MD      . albumin human 25 % solution 12.5 g  12.5 g Intravenous BID Loletha Grayer, MD 60 mL/hr at 10/13/20 0859 12.5 g at 10/13/20 0859  . ascorbic  acid (VITAMIN C) tablet 1,000 mg  1,000 mg Oral Once per day on Mon Wed Fri Clarnce Flock, MD   1,000 mg at 10/13/20 1610  . aspirin EC tablet 81 mg  81 mg Oral Once per day on Mon Wed Fri Clarnce Flock, MD   81 mg at 10/13/20 9604  . calcium-vitamin D (OSCAL WITH D) 500-200 MG-UNIT per tablet 1 tablet  1 tablet Oral Daily Clarnce Flock, MD   1 tablet at 10/13/20 [phone]  . Chlorhexidine Gluconate Cloth 2 % PADS 6 each  6 each Topical Daily Clarnce Flock, MD   6 each  at 10/12/20 1200  . enoxaparin (LOVENOX) injection 30 mg  30 mg Subcutaneous Q24H Clarnce Flock, MD   30 mg at 10/12/20 2124  . feeding supplement (BOOST / RESOURCE BREEZE) liquid 1 Container  1 Container Oral TID BM Wieting, Richard, MD      . furosemide (LASIX) injection 40 mg  40 mg Intravenous BID Loletha Grayer, MD   40 mg at 10/13/20 0903  . methocarbamol (ROBAXIN) 500 mg in dextrose 5 % 50 mL IVPB  500 mg Intravenous Q6H PRN Clarnce Flock, MD      . midodrine (PROAMATINE) tablet 5 mg  5 mg Oral TID WC Loletha Grayer, MD   5 mg at 10/13/20 1305  . multivitamin with minerals tablet 1 tablet  1 tablet Oral Daily Clarnce Flock, MD   1 tablet at 10/13/20 8119  . nystatin (MYCOSTATIN) 100000 UNIT/ML suspension 500,000 Units  5 mL Oral QID Loletha Grayer, MD   500,000 Units at 10/13/20 1305  . ondansetron (ZOFRAN) tablet 4 mg  4 mg Oral Q6H PRN Clarnce Flock, MD       Or  . ondansetron [facility]) injection 4 mg  4 mg Intravenous Q6H PRN Clarnce Flock, MD      . oxyCODONE (Oxy IR/ROXICODONE) immediate release tablet 5 mg  5 mg Oral Q4H PRN Clarnce Flock, MD      . polyethylene glycol (MIRALAX / GLYCOLAX) packet 17 g  17 g Oral Daily PRN Wieting, Richard, MD      . prochlorperazine (COMPAZINE) tablet 10 mg  10 mg Oral Q6H PRN Clarnce Flock, MD      . spironolactone (ALDACTONE) tablet 50 mg  50 mg Oral Daily Clarnce Flock, MD   50 mg at 10/13/20 1478  . traZODone (DESYREL) tablet 25 mg  25 mg Oral QHS PRN Clarnce Flock, MD        VITAL SIGNS: BP 128/71 (BP Location: Right Arm)   Pulse 76   Temp 97.8 F (36.6 C)   Resp 16   Ht 5' 3"  (1.6 m)   Wt 160 lb 4.4 oz (72.7 kg)   LMP  (LMP Unknown)   SpO2 100%   BMI 28.39 kg/m  Filed Weights   10/10/20 1058 10/10/20 1715  Weight: 140 lb (63.5 kg) 160 lb 4.4 oz (72.7 kg)    Estimated body mass index is 28.39 kg/m as calculated from the following:   Height as of this encounter: 5' 3"  (1.6 m).    Weight as of this encounter: 160 lb 4.4 oz (72.7 kg).  LABS: CBC:    Component Value Date/Time   WBC 2.2 (L) 10/13/2020 0613   HGB 8.1 (L) 10/13/2020 0613   HGB 13.6 07/15/2017 1038   HCT 23.3 (L) 10/13/2020 0613   HCT 42.9 07/15/2017 1038   PLT  86 (L) 10/13/2020 0613   PLT 226 07/15/2017 1038   MCV 87.6 10/13/2020 0613   MCV 86 07/15/2017 1038   MCV 84 08/08/2014 1001   NEUTROABS 7.0 10/10/2020 1226   NEUTROABS 8.3 (H) 07/15/2017 1038   NEUTROABS 3.6 08/08/2014 1001   LYMPHSABS 0.3 (L) 10/10/2020 1226   LYMPHSABS 0.6 (L) 07/15/2017 1038   LYMPHSABS 1.4 08/08/2014 1001   MONOABS 0.1 10/10/2020 1226   MONOABS 0.5 08/08/2014 1001   EOSABS 0.0 10/10/2020 1226   EOSABS 0.0 07/15/2017 1038   EOSABS 0.1 08/08/2014 1001   BASOSABS 0.0 10/10/2020 1226   BASOSABS 0.0 07/15/2017 1038   BASOSABS 0.0 08/08/2014 1001   Comprehensive Metabolic Panel:    Component Value Date/Time   NA 126 (L) 10/13/2020 0613   NA 140 03/24/2020 1008   NA 142 11/04/2011 1005   K 4.4 10/13/2020 0613   K 3.3 (L) 11/04/2011 1005   CL 100 10/13/2020 0613   CL 101 11/04/2011 1005   CO2 18 (L) 10/13/2020 0613   CO2 29 11/04/2011 1005   BUN 47 (H) 10/13/2020 0613   BUN 21 03/24/2020 1008   BUN 17 11/04/2011 1005   CREATININE 1.64 (H) 10/13/2020 0613   CREATININE 1.74 (H) 01/07/2020 0938   GLUCOSE 108 (H) 10/13/2020 0613   GLUCOSE 135 (H) 11/04/2011 1005   CALCIUM 8.4 (L) 10/13/2020 0613   CALCIUM 9.3 11/04/2011 1005   AST 86 (H) 10/12/2020 0500   AST 16 11/04/2011 1005   ALT 39 10/12/2020 0500   ALT 23 11/04/2011 1005   ALKPHOS 354 (H) 10/12/2020 0500   ALKPHOS 98 11/04/2011 1005   BILITOT 5.4 (H) 10/12/2020 0500   BILITOT 0.4 01/16/2018 1059   BILITOT 0.2 11/04/2011 1005   PROT 5.0 (L) 10/12/2020 0500   PROT 6.3 01/16/2018 1059   PROT 7.5 11/04/2011 1005   ALBUMIN 2.1 (L) 10/12/2020 0500   ALBUMIN 4.0 01/16/2018 1059   ALBUMIN 3.6 11/04/2011 1005    RADIOGRAPHIC STUDIES: NM PET Image  Restag (PS) Skull Base To Thigh  Result Date: 09/24/2020 CLINICAL DATA:  Aortic Atherosclerois (ICD10-170.0) treatment strategy for pancreatic cancer. EXAM: NUCLEAR MEDICINE PET SKULL BASE TO THIGH TECHNIQUE: 8.1 mCi F-18 FDG was injected intravenously. Full-ring PET imaging was performed from the skull base to thigh after the radiotracer. CT data was obtained and used for attenuation correction and anatomic localization. Fasting blood glucose: 111 mg/dl COMPARISON:  04/17/2020. FINDINGS: Mediastinal blood pool activity: SUV max 3.1 Liver activity: SUV max NA NECK: No hypermetabolic lymph nodes in the neck. Incidental CT findings: none CHEST: No hypermetabolic mediastinal or hilar nodes. No suspicious pulmonary nodules on the CT scan. Incidental CT findings: Coronary artery calcification is evident. Atherosclerotic calcification is noted in the wall of the thoracic aorta. Right Port-A-Cath tip is positioned at the SVC/RA junction. ABDOMEN/PELVIS: Hypermetabolism seen in the pancreatic lesion previously has decreased slightly in the interval with SUV max = 5.2 today compared to 7.7 previously. The left para-aortic retroperitoneal lesion measures 1.7 x 1.6 cm today and shows low level hypermetabolism with SUV max = 3.8. This is lesion that measured 2.6 x 1.3 cm on previous PET-CT with SUV max = 6.6 and represents the site of left ureteral obstruction. Omental disease seen previously has decreased on CT imaging. Low level FDG accumulation identified in a 2.5 cm left paramidline omental lesion (165/3) with SUV max = 4.0 1.7 cm small midline omental lesion just deep to the umbilicus on 883/2 demonstrates SUV max =  3.8. Incidental CT findings: Left hydronephrosis is similar to prior. Moderate volume ascites is new in the interval. There is abdominal aortic atherosclerosis without aneurysm. Gallbladder is distended SKELETON: No focal hypermetabolic activity to suggest skeletal metastasis. Incidental CT findings: No  worrisome lytic or sclerotic osseous abnormality. IMPRESSION: 1. Interval slight decrease in hypermetabolism associated with the patient's known pancreatic lesion. Main duct dilatation in the tail of pancreas is similar to prior. 2. Hypermetabolic left retroperitoneal/para-aortic lesion measures slightly smaller today with associated interval decrease in hypermetabolic FDG accumulation. This lesion obstructs the left ureter with similar appearance of left hydroureteronephrosis. 3. Omental disease identified on the previous study has decreased in the interval but persists. 4. Despite the generalized trend towards improvement on today's study, there is new moderate volume ascites in the abdomen and pelvis. Electronically Signed   By: Misty Stanley M.D.   On: 09/24/2020 15:40   DG Chest Portable 1 View  Result Date: 10/10/2020 CLINICAL DATA:  Weakness. EXAM: PORTABLE CHEST 1 VIEW COMPARISON:  August 07, 2018. FINDINGS: The heart size and mediastinal contours are within normal limits. No pneumothorax or significant pleural effusion is noted. Right internal jugular Port-A-Cath is noted with distal tip in expected position of cavoatrial junction. Hypoinflation of the lungs is noted with minimal bibasilar subsegmental atelectasis. The visualized skeletal structures are unremarkable. IMPRESSION: Hypoinflation of the lungs with minimal bibasilar subsegmental atelectasis. Electronically Signed   By: Marijo Conception M.D.   On: 10/10/2020 13:10    PERFORMANCE STATUS (ECOG) : 3 - Symptomatic, >50% confined to bed  Review of Systems Unless otherwise noted, a complete review of systems is negative.  Physical Exam General: NAD Pulmonary: Unlabored Extremities: no edema, no joint deformities Skin: no rashes Neurological: Weakness but otherwise nonfocal  IMPRESSION: I met with patient and daughter to discuss goals.  Patient has had recent decline with progressive weakness and poor oral intake.  At baseline,  patient lives at home with her daughter and has been functionally independent with all care until the last couple of weeks.  Recent PET CT on 09/24/2020 revealed interval disease improvement with reduced hypermetabolism of the pancreatic mass, omental and retroperitoneal disease.  Plan is for probable SNF.  I would recommend palliative care following after discharge from the hospital.  Patient will likely require chemo holiday with consideration for further treatment dependent upon her performance status.  Both patient and daughter are in agreement with holding chemotherapy and pursuing SNF to see if she is able to recover her strength.  I did discuss with daughter the possible future option of hospice in the event of further decline.  PLAN: -Continue current scope of treatment -Probable SNF with palliative care following -I will also be happy to follow in the outpatient clinic  Case and plan discussed with Drs. Andre Lefort, and Wieting   Time Total: 60 minutes  Visit consisted of counseling and education dealing with the complex and emotionally intense issues of symptom management and palliative care in the setting of serious and potentially life-threatening illness.Greater than 50%  of this time was spent counseling and coordinating care related to the above assessment and plan.  Signed by: Altha Harm, PhD, NP-C

## 2020-10-13 NOTE — Progress Notes (Signed)
Patient ID: Veronica Colon, female   DOB: 1932/01/19, 87 y.o.   MRN: [id number] [facility] PROGRESS NOTE  Veronica Colon EPP:[id number] DOB: 09/03/1931 DOA: 10/10/2020 PCP: Veronica Grana, PA-C  HPI/Subjective: Patient feels very weak.  Still barely able to straight leg raise.  Declined paracentesis.  Admitted with weakness and has a history of metastatic pancreatic cancer.  Objective: Vitals:   10/13/20 0819 10/13/20 1135  BP: 124/67 133/74  Pulse: 79 70  Resp: 16 16  Temp: 98.6 F (37 C) 97.9 F (36.6 C)  SpO2: 100% 100%    Intake/Output Summary (Last 24 hours) at 10/13/2020 1532 Last data filed at 10/13/2020 1013 Gross per 24 hour  Intake 283.38 ml  Output 1400 ml  Net -1116.62 ml   Filed Weights   10/10/20 1058 10/10/20 1715  Weight: 63.5 kg 72.7 kg    ROS: Review of Systems  Respiratory: Negative for shortness of breath.   Cardiovascular: Negative for chest pain.  Gastrointestinal: Negative for abdominal pain, nausea and vomiting.   Exam: Physical Exam HENT:     Head: Normocephalic.     Mouth/Throat:     Pharynx: No oropharyngeal exudate.     Comments: Thrush on tongue Eyes:     General: Lids are normal.     Conjunctiva/sclera: Conjunctivae normal.  Cardiovascular:     Rate and Rhythm: Normal rate and regular rhythm.     Heart sounds: Normal heart sounds, S1 normal and S2 normal.  Pulmonary:     Breath sounds: Examination of the right-lower field reveals decreased breath sounds. Examination of the left-lower field reveals decreased breath sounds. Decreased breath sounds present. No wheezing, rhonchi or rales.  Abdominal:     General: There is distension.     Palpations: Abdomen is soft.     Tenderness: There is no abdominal tenderness.  Musculoskeletal:     Right lower leg: Swelling present.     Left lower leg: Swelling present.  Skin:    General: Skin is warm.     Findings: No rash.  Neurological:     Mental Status: She is alert and oriented to person,  place, and time.       Data Reviewed: Basic Metabolic Panel: Recent Labs  Lab 10/08/20 0818 10/10/20 1226 10/11/20 0756 10/12/20 0500 10/13/20 0613  NA 130* 128* 124* 125* 126*  K 3.9 4.3 4.6 4.6 4.4  CL 101 101 98 99 100  CO2 20* 18* 18* 18* 18*  GLUCOSE 150* 124* 95 105* 108*  BUN 26* 40* 36* 41* 47*  CREATININE 1.65* 1.72* 1.52* 1.50* 1.64*  CALCIUM 8.8* 8.5* 8.3* 8.1* 8.4*   Liver Function Tests: Recent Labs  Lab 10/08/20 0818 10/10/20 1226 10/11/20 0756 10/12/20 0500  AST 96* 45* 38 86*  ALT 68* 42 31 39  ALKPHOS 257* 202* 173* 354*  BILITOT 1.6* 1.9* 2.1* 5.4*  PROT 5.9* 5.5* 5.2* 5.0*  ALBUMIN 2.7* 2.5* 2.3* 2.1*   Recent Labs  Lab 10/10/20 1226  LIPASE 24   CBC: Recent Labs  Lab 10/08/20 0818 10/10/20 1226 10/11/20 0756 10/12/20 0500 10/13/20 0613  WBC 3.7* 7.4 8.3 7.0 2.2*  NEUTROABS 2.3 7.0  --   --   --   HGB 7.9* 8.9* 8.4* 8.0* 8.1*  HCT 23.5* 25.9* 25.4* 23.7* 23.3*  MCV 92.2 89.6 90.7 90.5 87.6  PLT 200 146* 128* 116* 86*     Recent Results (from the past 240 hour(s))  Resp Panel by RT-PCR (Flu A&B, Covid)  Nasopharyngeal Swab     Status: None   Collection Time: 10/10/20  2:07 PM   Specimen: Nasopharyngeal Swab; Nasopharyngeal(NP) swabs in vial transport medium  Result Value Ref Range Status   SARS Coronavirus 2 by RT PCR NEGATIVE NEGATIVE Final    Comment: (NOTE) SARS-CoV-2 target nucleic acids are NOT DETECTED.  The SARS-CoV-2 RNA is generally detectable in upper respiratory specimens during the acute phase of infection. The lowest concentration of SARS-CoV-2 viral copies this assay can detect is 138 copies/mL. A negative result does not preclude SARS-Cov-2 infection and should not be used as the sole basis for treatment or other patient management decisions. A negative result may occur with  improper specimen collection/handling, submission of specimen other than nasopharyngeal swab, presence of viral mutation(s) within  the areas targeted by this assay, and inadequate number of viral copies(<138 copies/mL). A negative result must be combined with clinical observations, patient history, and epidemiological information. The expected result is Negative.  Fact Sheet for Patients:  EntrepreneurPulse.com.au  Fact Sheet for Healthcare Providers:  IncredibleEmployment.be  This test is no t yet approved or cleared by the [place] FDA and  has been authorized for detection and/or diagnosis of SARS-CoV-2 by FDA under an Emergency Use Authorization (EUA). This EUA will remain  in effect (meaning this test can be used) for the duration of the COVID-19 declaration under Section 564(b)(1) of the Act, 21 U.S.C.section 360bbb-3(b)(1), unless the authorization is terminated  or revoked sooner.       Influenza A by PCR NEGATIVE NEGATIVE Final   Influenza B by PCR NEGATIVE NEGATIVE Final    Comment: (NOTE) The Xpert Xpress SARS-CoV-2/FLU/RSV plus assay is intended as an aid in the diagnosis of influenza from Nasopharyngeal swab specimens and should not be used as a sole basis for treatment. Nasal washings and aspirates are unacceptable for Xpert Xpress SARS-CoV-2/FLU/RSV testing.  Fact Sheet for Patients: EntrepreneurPulse.com.au  Fact Sheet for Healthcare Providers: IncredibleEmployment.be  This test is not yet approved or cleared by the [place] FDA and has been authorized for detection and/or diagnosis of SARS-CoV-2 by FDA under an Emergency Use Authorization (EUA). This EUA will remain in effect (meaning this test can be used) for the duration of the COVID-19 declaration under Section 564(b)(1) of the Act, 21 U.S.C. section 360bbb-3(b)(1), unless the authorization is terminated or revoked.  Performed at [facility], [address] [place], [place] [address]       Scheduled Meds: . ascorbic acid  1,000 mg  Oral Once per day on Mon Wed Fri  . aspirin EC  81 mg Oral Once per day on Mon Wed Fri  . calcium-vitamin D  1 tablet Oral Daily  . Chlorhexidine Gluconate Cloth  6 each Topical Daily  . enoxaparin (LOVENOX) injection  30 mg Subcutaneous Q24H  . feeding supplement  237 mL Oral BID BM  . furosemide  40 mg Intravenous BID  . midodrine  5 mg Oral TID WC  . multivitamin with minerals  1 tablet Oral Daily  . nystatin  5 mL Oral QID  . spironolactone  50 mg Oral Daily   Continuous Infusions: . albumin human 12.5 g (10/13/20 0859)  . methocarbamol (ROBAXIN) IV      Assessment/Plan:  1. Generalized weakness with the inability to walk.  Physical therapy recommends rehab. 2. Metastatic pancreatic adenocarcinoma.  Palliative care consultation.  Seen by oncology over the weekend.  Reviewing PET scan on 09/24/2020.  This shows hypermetabolic left retroperitoneal and  para-aortic lesions also pancreatic lesion and omental disease and ascites.  Patient again declined paracentesis.  Encourage eating. 3. Hyponatremia with a very low urine spot sodium.  Continue IV albumin and IV Lasix twice a day.  Midodrine to keep blood pressure higher.  Continue to monitor sodium and creatinine.  Sodium up to 126 today.  Creatinine up at 1.64. 4. Pancytopenia secondary to chemotherapy 5. Chronic left-sided hydronephrosis 6. Impaired fasting glucose.  Last hemoglobin A1c 5.9 7. History of temporal arteritis. 8. Constipation resolved after Ensure.  Make MiraLAX as needed 9. Thrush on the tongue.  Continue nystatin swish and swallow 10. Patient is a DO NOT RESUSCITATE  Code Status:     Code Status Orders  (From admission, onward)         Start     Ordered   10/10/20 1719  Do not attempt resuscitation (DNR)  Continuous       Question Answer Comment  In the event of cardiac or respiratory ARREST Do not call a "code blue"   In the event of cardiac or respiratory ARREST Do not perform Intubation, CPR,  defibrillation or ACLS   In the event of cardiac or respiratory ARREST Use medication by any route, position, wound care, and other measures to relive pain and suffering. May use oxygen, suction and manual treatment of airway obstruction as needed for comfort.      10/10/20 1718        Code Status History    This patient has a current code status but no historical code status.   Advance Care Planning Activity     Family Communication: Spoke with family at the bedside Disposition Plan: Status is: Inpatient  Dispo: The patient is from: Home              Anticipated d/c is to: Rehab              Patient currently on IV Lasix and albumin to try to elevate the sodium.  Physical therapy to help out with strength.  Palliative care and oncology following.   Difficult to place patient.  Hopefully not.  Time spent: 28 minutes  [place]

## 2020-10-13 NOTE — Progress Notes (Signed)
Physical Therapy Treatment Patient Details Name: Veronica Colon MRN: [id number] DOB: 05/17/1932 Today's Date: 10/13/2020    History of Present Illness Pt is an 87 y.o. female with hx of metastatic pancreatic cancer currently undergoing palliative chemotherapy, history of breast and colon cancer in remission, type 2 diabetes, MGUS, CKD, who presents with profound weakness.  MD assessment includes: physical deconditioning, metastatic pancreatic cancer, AKI, elevated troponin, hyponatremia, and anemia.    PT Comments    Patient received in bed, daughter present in room. She is agreeable to PT session. Patient reports she is feeling very weak. Requires max assist for bed mobility. Once positioned she is able to sit unsupported at edge of bed. Patient unable to attempt standing this visit due to weakness.  Patient will continue to benefit from skilled PT while here to improve strength and functional independence.      Follow Up Recommendations  SNF;Supervision for mobility/OOB     Equipment Recommendations  Other (comment) (TBD)    Recommendations for Other Services       Precautions / Restrictions Precautions Precautions: Fall Restrictions Weight Bearing Restrictions: No    Mobility  Bed Mobility Overal bed mobility: Needs Assistance Bed Mobility: Supine to Sit;Sit to Supine     Supine to sit: Max assist Sit to supine: Max assist   General bed mobility comments: Able to bring legs slightly over toward side of the bed, but required max assist after that to get sitting up on the side of the bed.    Transfers                 General transfer comment: Unable. Patient reports she is too weak to attempt.  Ambulation/Gait             General Gait Details: unable   Stairs             Wheelchair Mobility    Modified Rankin (Stroke Patients Only)       Balance Overall balance assessment: Needs assistance Sitting-balance support: Feet supported Sitting  balance-Leahy Scale: Good Sitting balance - Comments: able to statically sit with supervision                                    Cognition Arousal/Alertness: Lethargic Behavior During Therapy: WFL for tasks assessed/performed Overall Cognitive Status: Within Functional Limits for tasks assessed                                        Exercises Other Exercises Other Exercises: B LE exercises: AP, hip abd/add, heel slides, SLR x 10 reps each with min/mod assist due to weakness.    General Comments        Pertinent Vitals/Pain Pain Assessment: No/denies pain    Home Living                      Prior Function            PT Goals (current goals can now be found in the care plan section) Acute Rehab PT Goals Patient Stated Goal: to get stronger and do more for myself PT Goal Formulation: With patient Time For Goal Achievement: 10/23/20 Potential to Achieve Goals: Fair Progress towards PT goals: Not progressing toward goals - comment (Patient very weak)    Frequency  Min 2X/week      PT Plan Current plan remains appropriate    Co-evaluation              AM-PAC PT "6 Clicks" Mobility   Outcome Measure  Help needed turning from your back to your side while in a flat bed without using bedrails?: A Lot Help needed moving from lying on your back to sitting on the side of a flat bed without using bedrails?: A Lot Help needed moving to and from a bed to a chair (including a wheelchair)?: Total Help needed standing up from a chair using your arms (e.g., wheelchair or bedside chair)?: Total Help needed to walk in hospital room?: Total Help needed climbing 3-5 steps with a railing? : Total 6 Click Score: 8    End of Session   Activity Tolerance: Patient limited by fatigue Patient left: in bed;with bed alarm set;with family/visitor present;with call bell/phone within reach Nurse Communication: Mobility status PT Visit  Diagnosis: Muscle weakness (generalized) (M62.81);Other abnormalities of gait and mobility (R26.89);Adult, failure to thrive (R62.7)     Time: 9432-0037 PT Time Calculation (min) (ACUTE ONLY): 23 min  Charges:  $Therapeutic Exercise: 8-22 mins $Therapeutic Activity: 8-22 mins                     Daliana Leverett, PT, GCS 10/13/20,11:20 AM

## 2020-10-14 DIAGNOSIS — C259 Malignant neoplasm of pancreas, unspecified: Secondary | ICD-10-CM

## 2020-10-14 DIAGNOSIS — C786 Secondary malignant neoplasm of retroperitoneum and peritoneum: Secondary | ICD-10-CM

## 2020-10-14 DIAGNOSIS — Z85038 Personal history of other malignant neoplasm of large intestine: Secondary | ICD-10-CM | POA: Diagnosis not present

## 2020-10-14 DIAGNOSIS — R531 Weakness: Secondary | ICD-10-CM | POA: Diagnosis not present

## 2020-10-14 DIAGNOSIS — C799 Secondary malignant neoplasm of unspecified site: Secondary | ICD-10-CM | POA: Diagnosis not present

## 2020-10-14 DIAGNOSIS — N179 Acute kidney failure, unspecified: Secondary | ICD-10-CM | POA: Diagnosis not present

## 2020-10-14 DIAGNOSIS — C250 Malignant neoplasm of head of pancreas: Secondary | ICD-10-CM | POA: Diagnosis not present

## 2020-10-14 DIAGNOSIS — E871 Hypo-osmolality and hyponatremia: Secondary | ICD-10-CM | POA: Diagnosis not present

## 2020-10-14 DIAGNOSIS — D472 Monoclonal gammopathy: Secondary | ICD-10-CM | POA: Diagnosis not present

## 2020-10-14 DIAGNOSIS — D72819 Decreased white blood cell count, unspecified: Secondary | ICD-10-CM

## 2020-10-14 DIAGNOSIS — Z853 Personal history of malignant neoplasm of breast: Secondary | ICD-10-CM

## 2020-10-14 DIAGNOSIS — D649 Anemia, unspecified: Secondary | ICD-10-CM

## 2020-10-14 LAB — BASIC METABOLIC PANEL
Anion gap: 10 (ref 5–15)
Anion gap: 10 (ref 5–15)
BUN: 50 mg/dL — ABNORMAL HIGH (ref 8–23)
BUN: 51 mg/dL — ABNORMAL HIGH (ref 8–23)
CO2: 18 mmol/L — ABNORMAL LOW (ref 22–32)
CO2: 19 mmol/L — ABNORMAL LOW (ref 22–32)
Calcium: 8.7 mg/dL — ABNORMAL LOW (ref 8.9–10.3)
Calcium: 8.7 mg/dL — ABNORMAL LOW (ref 8.9–10.3)
Chloride: 96 mmol/L — ABNORMAL LOW (ref 98–111)
Chloride: 93 mmol/L — ABNORMAL LOW (ref 98–111)
Creatinine, Ser: 1.71 mg/dL — ABNORMAL HIGH (ref 0.44–1.00)
Creatinine, Ser: 1.64 mg/dL — ABNORMAL HIGH (ref 0.44–1.00)
GFR, Estimated: 29 mL/min — ABNORMAL LOW (ref >60)
GFR, Estimated: 30 mL/min — ABNORMAL LOW (ref >60)
Glucose, Bld: 99 mg/dL (ref 70–99)
Glucose, Bld: 127 mg/dL — ABNORMAL HIGH (ref 70–99)
Potassium: 4.2 mmol/L (ref 3.5–5.1)
Potassium: 4.1 mmol/L (ref 3.5–5.1)
Sodium: 124 mmol/L — ABNORMAL LOW (ref 135–145)
Sodium: 122 mmol/L — ABNORMAL LOW (ref 135–145)

## 2020-10-14 LAB — HEPATIC FUNCTION PANEL
ALT: 27 U/L (ref 0–44)
AST: 38 U/L (ref 15–41)
Albumin: 2.7 g/dL — ABNORMAL LOW (ref 3.5–5.0)
Alkaline Phosphatase: 246 U/L — ABNORMAL HIGH (ref 38–126)
Bilirubin, Direct: 1 mg/dL — ABNORMAL HIGH (ref 0.0–0.2)
Indirect Bilirubin: 1.3 mg/dL — ABNORMAL HIGH (ref 0.3–0.9)
Total Bilirubin: 2.3 mg/dL — ABNORMAL HIGH (ref 0.3–1.2)
Total Protein: 5.6 g/dL — ABNORMAL LOW (ref 6.5–8.1)

## 2020-10-14 LAB — CBC
HCT: 23.2 % — ABNORMAL LOW (ref 36.0–46.0)
Hemoglobin: 8.1 g/dL — ABNORMAL LOW (ref 12.0–15.0)
MCH: 30.6 pg (ref 26.0–34.0)
MCHC: 34.9 g/dL (ref 30.0–36.0)
MCV: 87.5 fL (ref 80.0–100.0)
Platelets: 60 10*3/uL — ABNORMAL LOW (ref 150–400)
RBC: 2.65 MIL/uL — ABNORMAL LOW (ref 3.87–5.11)
RDW: 17.7 % — ABNORMAL HIGH (ref 11.5–15.5)
WBC: 1.3 10*3/uL — CL (ref 4.0–10.5)
nRBC: 0 % (ref 0.0–0.2)

## 2020-10-14 LAB — MAGNESIUM: Magnesium: 1.9 mg/dL (ref 1.7–2.4)

## 2020-10-14 LAB — SODIUM: Sodium: 123 mmol/L — ABNORMAL LOW (ref 135–145)

## 2020-10-14 MED ORDER — TBO-FILGRASTIM 300 MCG/0.5ML ~~LOC~~ SOSY
300.0000 ug | PREFILLED_SYRINGE | Freq: Once | SUBCUTANEOUS | Status: AC
  Administered 2020-10-14: 300 ug via SUBCUTANEOUS
  Filled 2020-10-14: qty 0.5

## 2020-10-14 MED ORDER — TOLVAPTAN 15 MG PO TABS
15.0000 mg | ORAL_TABLET | Freq: Once | ORAL | Status: DC
  Filled 2020-10-14: qty 1

## 2020-10-14 MED ORDER — TOLVAPTAN 15 MG PO TABS
15.0000 mg | ORAL_TABLET | Freq: Once | ORAL | Status: AC
  Administered 2020-10-14: 15 mg via ORAL
  Filled 2020-10-14 (×2): qty 1

## 2020-10-14 NOTE — Progress Notes (Signed)
Occupational Therapy Treatment Patient Details Name: Veronica Colon MRN: [id number] DOB: [birth year] Today's Date: 10/14/2020    History of present illness Pt is an 87 y.o. female with hx of metastatic pancreatic cancer currently undergoing palliative chemotherapy, history of breast and colon cancer in remission, type 2 diabetes, MGUS, CKD, who presents with profound weakness.  MD assessment includes: physical deconditioning, metastatic pancreatic cancer, AKI, elevated troponin, hyponatremia, and anemia.   OT comments  Veronica Colon was seen for OT treatment on this date. Upon arrival to room pt reclined in bed, daughter at bedside. Pt reports being too weak to move but agreeable to EOB bath. Pt requires encouragement and increased time to complete tasks. MOD A sup<>sit. MAX A don/doff B socks seated EOB. MOD A bathing seated EOB - assist for lower legs, back, and cues for thoroughness. Tolerated ~15 mins sitting EOB for bathing/grooming. MAX A pericare in standing, +2 assist for thoroughness. MIN A for x3 sit<>stand, tolerated longest trial ~2 mins standing for pericare. Pt completed bed level exercises as described below, daughter instructed in HEP and return demonstrated exercises. Pt making good progress toward goals. Pt continues to benefit from skilled OT services to maximize return to PLOF and minimize risk of future falls, injury, caregiver burden, and readmission. Will continue to follow POC. Discharge recommendation remains appropriate.    Follow Up Recommendations  SNF    Equipment Recommendations  3 in 1 bedside commode    Recommendations for Other Services      Precautions / Restrictions Precautions Precautions: Fall Restrictions Weight Bearing Restrictions: No       Mobility Bed Mobility Overal bed mobility: Needs Assistance Bed Mobility: Supine to Sit;Sit to Supine     Supine to sit: Mod assist Sit to supine: Max assist        Transfers Overall transfer level: Needs  assistance Equipment used: 1 person hand held assist Transfers: Sit to/from Stand Sit to Stand: Min assist;From elevated surface         General transfer comment: requires increased time to initiate    Balance Overall balance assessment: Needs assistance Sitting-balance support: Feet supported Sitting balance-Leahy Scale: Good Sitting balance - Comments: SUP dynamic sitting balance reaching inside BOS Postural control: Posterior lean Standing balance support: Bilateral upper extremity supported;During functional activity Standing balance-Leahy Scale: Fair Standing balance comment: requires support, tolerates ~2 min standing                           ADL either performed or assessed with clinical judgement   ADL Overall ADL's : Needs assistance/impaired                                       General ADL Comments: MAX A don/doff B socks seated EOB. MOD A bathing seated EOB - assist for lower legs and rear. Tolerated ~15 mins sitting EOB for bathing/grooming. MAX A pericare in standing, +2 assist for thoroughness.               Cognition Arousal/Alertness: Awake/alert Behavior During Therapy: WFL for tasks assessed/performed Overall Cognitive Status: Within Functional Limits for tasks assessed                                          Exercises  Exercises: Other exercises;General Lower Extremity General Exercises - Lower Extremity Ankle Circles/Pumps: AROM;Strengthening;Both;5 reps Quad Sets: AROM;Strengthening;Both;5 reps Gluteal Sets: AROM;Strengthening;Both;5 reps Short Arc Quad: AROM;Strengthening;Both;5 reps Other Exercises Other Exercises: Pt and daughter educated re: OT role, DME recs, d/c recs, HEP, importance of funcitonal mobility and OOB to chair for fucntional strengthening Other Exercises: Grooming, bathing, toileting, sup<>sit, sit<>stand x3, sitting/standing balance/tolerance           Pertinent Vitals/  Pain       Pain Assessment: No/denies pain         Frequency  Min 1X/week        Progress Toward Goals  OT Goals(current goals can now be found in the care plan section)  Progress towards OT goals: Progressing toward goals  Acute Rehab OT Goals Patient Stated Goal: to get stronger and do more for myself OT Goal Formulation: With patient Time For Goal Achievement: 10/25/20 Potential to Achieve Goals: Fair ADL Goals Pt Will Perform Lower Body Dressing: with set-up;with min assist Pt Will Transfer to Toilet: with min guard assist Additional ADL Goal #1: Pt to increase bilateral UE strength by 1 mm grade to assist with completing ADL tasks  Plan Discharge plan remains appropriate;Frequency remains appropriate       AM-PAC OT "6 Clicks" Daily Activity     Outcome Measure   Help from another person eating meals?: None Help from another person taking care of personal grooming?: A Little Help from another person toileting, which includes using toliet, bedpan, or urinal?: A Lot Help from another person bathing (including washing, rinsing, drying)?: A Lot Help from another person to put on and taking off regular upper body clothing?: A Little Help from another person to put on and taking off regular lower body clothing?: A Lot 6 Click Score: 16    End of Session    OT Visit Diagnosis: Unsteadiness on feet (R26.81);Muscle weakness (generalized) (M62.81)   Activity Tolerance Patient tolerated treatment well   Patient Left in bed;with call bell/phone within reach;with family/visitor present   Nurse Communication          Time: 0223-3612 OT Time Calculation (min): 39 min  Charges: OT General Charges $OT Visit: 1 Visit OT Treatments $Self Care/Home Management : 23-37 mins $Therapeutic Exercise: 8-22 mins  Dessie Coma, M.S. OTR/L  10/14/20, 10:13 AM  ascom [phone]

## 2020-10-14 NOTE — Progress Notes (Addendum)
[facility]  ROUNDING NOTE   Subjective:   Veronica Colon is a 87 year old African-American female with a past medical history of metastatic pancreatic cancer currently undergoing palliative chemotherapy, history of colon and breast cancer ( in remission), type 2 diabetes, CKD, and MGUS.  She presents to the ED with generalized weakness.  She is a known patient to the [facility].  Patient currently lives with daughter, who was at bedside.  Daughter states she feels her mother's health has declined since beginning chemotherapy.  This includes loss of appetite and weakness.  We have been consulted due to to patient's hyponatremia.  At time of admission patient sodium was 128 and has continued to decline since that time.  Patient is seen resting quietly in bed.  Daughter at bedside.  Daughter states because of her mother's lack of appetite, she has tried to force fluids to prevent dehydration.  These fluids have predominantly been water.  Daughter denies patient complaints of nausea and shortness of breath.  Objective:  Vital signs in last 24 hours:  Temp:  [97.8 F (36.6 C)-98.8 F (37.1 C)] 98.1 F (36.7 C) (04/05 1100) Pulse Rate:  [70-76] 71 (04/05 1100) Resp:  [16-18] 16 (04/05 0740) BP: (128-133)/(64-72) 128/72 (04/05 1100) SpO2:  [95 %-100 %] 100 % (04/05 1100)  Weight change:  Filed Weights   10/10/20 1058 10/10/20 1715  Weight: 63.5 kg 72.7 kg    Intake/Output: I/O last 3 completed shifts: In: 523.4 [P.O.:480; IV Piggyback:43.4] Out: 3100 [Urine:3100]   Intake/Output this shift:  Total I/O In: 240 [P.O.:240] Out: -   Physical Exam: General: NAD, resting quietly in b  Head: Normocephalic, atraumatic. Moist oral mucosal, hard of hearing   Lungs:  Clear to auscultation  Heart: Regular rate and rhythm  Abdomen:  Soft, nontender, non distended  Extremities: 1+ peripheral edema.  Neurologic: Alert, able to answer simple questions  Skin: No lesions       Basic  Metabolic Panel: Recent Labs  Lab 10/11/20 0756 10/12/20 0500 10/13/20 0613 10/14/20 0754 10/14/20 1047  NA 124* 125* 126* 124* 122*  K 4.6 4.6 4.4 4.2 4.1  CL 98 99 100 96* 93*  CO2 18* 18* 18* 18* 19*  GLUCOSE 95 105* 108* 99 127*  BUN 36* 41* 47* 50* 51*  CREATININE 1.52* 1.50* 1.64* 1.71* 1.64*  CALCIUM 8.3* 8.1* 8.4* 8.7* 8.7*  MG  --   --   --  1.9  --     Liver Function Tests: Recent Labs  Lab 10/08/20 0818 10/10/20 1226 10/11/20 0756 10/12/20 0500 10/14/20 1047  AST 96* 45* 38 86* 38  ALT 68* 42 31 39 27  ALKPHOS 257* 202* 173* 354* 246*  BILITOT 1.6* 1.9* 2.1* 5.4* 2.3*  PROT 5.9* 5.5* 5.2* 5.0* 5.6*  ALBUMIN 2.7* 2.5* 2.3* 2.1* 2.7*   Recent Labs  Lab 10/10/20 1226  LIPASE 24   No results for input(s): AMMONIA in the last 168 hours.  CBC: Recent Labs  Lab 10/08/20 0818 10/10/20 1226 10/11/20 0756 10/12/20 0500 10/13/20 0613 10/14/20 0754  WBC 3.7* 7.4 8.3 7.0 2.2* 1.3*  NEUTROABS 2.3 7.0  --   --   --   --   HGB 7.9* 8.9* 8.4* 8.0* 8.1* 8.1*  HCT 23.5* 25.9* 25.4* 23.7* 23.3* 23.2*  MCV 92.2 89.6 90.7 90.5 87.6 87.5  PLT 200 146* 128* 116* 86* 60*    Cardiac Enzymes: No results for input(s): CKTOTAL, CKMB, CKMBINDEX, TROPONINI in the last 168 hours.  BNP: Invalid input(s): POCBNP  CBG: No results for input(s): GLUCAP in the last 168 hours.  Microbiology: Results for orders placed or performed during the hospital encounter of 10/10/20  Resp Panel by RT-PCR (Flu A&B, Covid) Nasopharyngeal Swab     Status: None   Collection Time: 10/10/20  2:07 PM   Specimen: Nasopharyngeal Swab; Nasopharyngeal(NP) swabs in vial transport medium  Result Value Ref Range Status   SARS Coronavirus 2 by RT PCR NEGATIVE NEGATIVE Final    Comment: (NOTE) SARS-CoV-2 target nucleic acids are NOT DETECTED.  The SARS-CoV-2 RNA is generally detectable in upper respiratory specimens during the acute phase of infection. The lowest concentration of SARS-CoV-2  viral copies this assay can detect is 138 copies/mL. A negative result does not preclude SARS-Cov-2 infection and should not be used as the sole basis for treatment or other patient management decisions. A negative result may occur with  improper specimen collection/handling, submission of specimen other than nasopharyngeal swab, presence of viral mutation(s) within the areas targeted by this assay, and inadequate number of viral copies(<138 copies/mL). A negative result must be combined with clinical observations, patient history, and epidemiological information. The expected result is Negative.  Fact Sheet for Patients:  EntrepreneurPulse.com.au  Fact Sheet for Healthcare Providers:  IncredibleEmployment.be  This test is no t yet approved or cleared by the [place] FDA and  has been authorized for detection and/or diagnosis of SARS-CoV-2 by FDA under an Emergency Use Authorization (EUA). This EUA will remain  in effect (meaning this test can be used) for the duration of the COVID-19 declaration under Section 564(b)(1) of the Act, 21 U.S.C.section 360bbb-3(b)(1), unless the authorization is terminated  or revoked sooner.       Influenza A by PCR NEGATIVE NEGATIVE Final   Influenza B by PCR NEGATIVE NEGATIVE Final    Comment: (NOTE) The Xpert Xpress SARS-CoV-2/FLU/RSV plus assay is intended as an aid in the diagnosis of influenza from Nasopharyngeal swab specimens and should not be used as a sole basis for treatment. Nasal washings and aspirates are unacceptable for Xpert Xpress SARS-CoV-2/FLU/RSV testing.  Fact Sheet for Patients: EntrepreneurPulse.com.au  Fact Sheet for Healthcare Providers: IncredibleEmployment.be  This test is not yet approved or cleared by the [place] FDA and has been authorized for detection and/or diagnosis of SARS-CoV-2 by FDA under an Emergency Use Authorization  (EUA). This EUA will remain in effect (meaning this test can be used) for the duration of the COVID-19 declaration under Section 564(b)(1) of the Act, 21 U.S.C. section 360bbb-3(b)(1), unless the authorization is terminated or revoked.  Performed at [facility], [address] [place], [place] [address]     Coagulation Studies: No results for input(s): LABPROT, INR in the last 72 hours.  Urinalysis: No results for input(s): COLORURINE, LABSPEC, PHURINE, GLUCOSEU, HGBUR, BILIRUBINUR, KETONESUR, PROTEINUR, UROBILINOGEN, NITRITE, LEUKOCYTESUR in the last 72 hours.  Invalid input(s): APPERANCEUR    Imaging: No results found.   Medications:   . methocarbamol (ROBAXIN) IV     . ascorbic acid  1,000 mg Oral Once per day on Mon Wed Fri  . aspirin EC  81 mg Oral Once per day on Mon Wed Fri  . calcium-vitamin D  1 tablet Oral Daily  . Chlorhexidine Gluconate Cloth  6 each Topical Daily  . enoxaparin (LOVENOX) injection  30 mg Subcutaneous Q24H  . feeding supplement  1 Container Oral TID BM  . midodrine  5 mg Oral TID WC  . multivitamin with minerals  1 tablet Oral Daily  . nystatin  5 mL Oral QID  . spironolactone  50 mg Oral Daily  . Tbo-filgastrim (GRANIX) SQ  300 mcg Subcutaneous Once  . tolvaptan  15 mg Oral Once   acetaminophen **OR** acetaminophen, methocarbamol (ROBAXIN) IV, ondansetron **OR** ondansetron (ZOFRAN) IV, oxyCODONE, polyethylene glycol, prochlorperazine, traZODone  Assessment/ Plan:  Ms. Eldean Klatt Headlee is a 87 y.o.  female with a past medical history of metastatic pancreatic cancer currently undergoing palliative chemotherapy, history of colon and breast cancer ( in remission), type 2 diabetes, CKD, and MGUS.  1. Hyponatremia likely due to excessive free water intake, NA-128    History received from daughter refers to decreasing appetite over several weeks.  Received lasix and Albumin to mobilize interstitial fluid Current level 122 Tolvaptan  today for water diuresis Regular diet to encourage nutrition  Limit clear fluids Avoid hypotension.  Continue midodrine  2. Acute Kidney Injury on chronic kidney disease stage 3b with baseline creatinine 1.51 and GFR of 36 on 03/21/20 .  Acute kidney injury secondary to chemo induced loss of appetite Encourage oral nutrition  Lab Results  Component Value Date   CREATININE 1.64 (H) 10/14/2020   CREATININE 1.71 (H) 10/14/2020   CREATININE 1.64 (H) 10/13/2020    Intake/Output Summary (Last 24 hours) at 10/14/2020 1215 Last data filed at 10/14/2020 1006 Gross per 24 hour  Intake 480 ml  Output 1700 ml  Net -1220 ml   3. Diabetes mellitis with CKD - stable during this admission Lab Results  Component Value Date   HGBA1C 5.9 (H) 10/11/2020       LOS: 4 Shantelle Breeze 4/5/202212:15 PM   Patient was seen and evaluated with [name], NP.  Plan of care was discussed with patient as well as NP.  I agree with the note as documented with edits.

## 2020-10-14 NOTE — TOC Progression Note (Signed)
Transition of Care [facility]) - Progression Note    Patient Details  Name: Veronica Colon MRN: [id number] Date of Birth: 1931/10/28  Transition of Care [facility]) CM/SW [place], RN Phone Number: 10/14/2020, 3:11 PM  Clinical Narrative: TOC in to see patient and daughter.  Patient responsive to discharge planning conversation with daughter.  [facility] is willing to accept patient, per Tammy.  Patient will have an oncology appointment, that daughter is amenable to transport her to.  Tammy at [facility] notified that patient may be discharged in 1-2 days.  TOC contact information given, TOC to follow through discharge.    Expected Discharge Plan: Skilled Nursing Facility Barriers to Discharge: Other (comment) (Pt continue ongoing chemotherapy)  Expected Discharge Plan and Services Expected Discharge Plan: [place]   Discharge Planning Services: CM Consult   Living arrangements for the past 2 months: Single Family Home (Lives with her daughter)                                       Social Determinants of Health (SDOH) Interventions    Readmission Risk Interventions Readmission Risk Prevention Plan 10/14/2020  Transportation Screening Complete  PCP or Specialist Appt within 3-5 Days Complete  HRI or [place] Complete  Social Work Consult for [place] Planning/Counseling Complete  Palliative Care Screening Not Applicable  Medication Review [employer]) Complete  Some recent data might be hidden

## 2020-10-14 NOTE — Progress Notes (Addendum)
MEDICATION RELATED CONSULT NOTE - INITIAL Pharmacy Consult for tolvaptan [facility]) per pharmacy monitoring  Indication: low Na 124  Allergies  Allergen Reactions  . Hydrochlorothiazide Other (See Comments)    Hypokalemia   . Lisinopril Other (See Comments) and Cough    Hands numb   . Metformin And Related     Fatigue, "heart was beating slower"    Patient Measurements: Height: 5\' 3"  (160 cm) Weight: 72.7 kg (160 lb 4.4 oz) IBW/kg (Calculated) : 52.4 Adjusted Body Weight:    Vital Signs: Temp: 98.4 F (36.9 C) (04/05 0740) Temp Source: Oral (04/04 2319) BP: 128/65 (04/05 0740) Pulse Rate: 70 (04/05 0740) Intake/Output from previous day: 04/04 0701 - 04/05 0700 In: 480 [P.O.:480] Out: 1700 [Urine:1700] Intake/Output from this shift: Total I/O In: 240 [P.O.:240] Out: -   Labs: Recent Labs    10/12/20 0500 10/13/20 0613 10/14/20 0754  WBC 7.0 2.2* 1.3*  HGB 8.0* 8.1* 8.1*  HCT 23.7* 23.3* 23.2*  PLT 116* 86* 60*  CREATININE 1.50* 1.64* 1.71*  MG  --   --  1.9  ALBUMIN 2.1*  --   --   PROT 5.0*  --   --   AST 86*  --   --   ALT 39  --   --   ALKPHOS 354*  --   --   BILITOT 5.4*  --   --    Estimated Creatinine Clearance: 22.1 mL/min (A) (by C-G formula based on SCr of 1.71 mg/dL (H)).  Medical History: Past Medical History:  Diagnosis Date  . Allergy   . Anemia associated with chemotherapy   . Anxiety and depression   . Anxiety and depression   . Arthritis   . Breast cancer ([place]) 1990   LT MASTECTOMY  . Cancer ([place]) 1990   LT BREAST MASTECTOMY  . CKD (chronic kidney disease), stage III ([place])   . Diabetes mellitus without complication ([place])   . Family history of breast cancer   . Family history of pancreatic cancer   . GERD (gastroesophageal reflux disease)   . Hypertension   . Hypertensive CKD (chronic kidney disease)   . Personal history of chemotherapy   . Status post chemotherapy    breast cancer  . Temporal arteritis ([place]) 10/28/2016    Overview:  Right temporal arteritis  Biopsy proven 11/08/2016    Medications:  Scheduled:  . ascorbic acid  1,000 mg Oral Once per day on Mon Wed Fri  . aspirin EC  81 mg Oral Once per day on Mon Wed Fri  . calcium-vitamin D  1 tablet Oral Daily  . Chlorhexidine Gluconate Cloth  6 each Topical Daily  . enoxaparin (LOVENOX) injection  30 mg Subcutaneous Q24H  . feeding supplement  1 Container Oral TID BM  . midodrine  5 mg Oral TID WC  . multivitamin with minerals  1 tablet Oral Daily  . nystatin  5 mL Oral QID  . spironolactone  50 mg Oral Daily  . Tbo-filgastrim (GRANIX) SQ  300 mcg Subcutaneous Once  . tolvaptan  15 mg Oral Once   Infusions:  . methocarbamol (ROBAXIN) IV      Assessment: 87 yo w/ Na 124. Tolvaptan 15 mg po x 1 ordered.   Goal of Therapy: Na WNL  Plan:  4/5 0754 Na 124 4/5 1047 Na 122 (tolvaptan given at 1252)  Na ordered q8h x 2 Monitor and plan to Hold tolvaptan [facility]) and notify MD if sodium increases more than  8 mEq/L in 8 hours OR greater than 12 mEq/L in 24 hours.  Denese Mentink A 10/14/2020,10:18 AM

## 2020-10-14 NOTE — Progress Notes (Signed)
Patient ID: Veronica Colon, female   DOB: 12/20/1931, 87 y.o.   MRN: [id number] [facility] PROGRESS NOTE  Veronica Colon JJO:[id number] DOB: 14-Nov-1931 DOA: 10/10/2020 PCP: Delsa Grana, PA-C  HPI/Subjective: Patient still complains of weakness.  Daughter states that she was able to stand with physical therapy.  Still with poor appetite.  Objective: Vitals:   10/14/20 0740 10/14/20 1100  BP: 128/65 128/72  Pulse: 70 71  Resp: 16   Temp: 98.4 F (36.9 C) 98.1 F (36.7 C)  SpO2: 100% 100%    Intake/Output Summary (Last 24 hours) at 10/14/2020 1418 Last data filed at 10/14/2020 1006 Gross per 24 hour  Intake 480 ml  Output 1700 ml  Net -1220 ml   Filed Weights   10/10/20 1058 10/10/20 1715  Weight: 63.5 kg 72.7 kg    ROS: Review of Systems  Respiratory: Negative for shortness of breath.   Cardiovascular: Negative for chest pain.  Gastrointestinal: Negative for abdominal pain, nausea and vomiting.   Exam: Physical Exam HENT:     Head: Normocephalic.     Mouth/Throat:     Pharynx: No oropharyngeal exudate.  Eyes:     General: Lids are normal.     Conjunctiva/sclera: Conjunctivae normal.     Pupils: Pupils are equal, round, and reactive to light.  Cardiovascular:     Rate and Rhythm: Normal rate and regular rhythm.     Heart sounds: S1 normal and S2 normal. Murmur heard.   Systolic murmur is present with a grade of 3/6.   Pulmonary:     Breath sounds: Examination of the right-lower field reveals decreased breath sounds. Examination of the left-lower field reveals decreased breath sounds. Decreased breath sounds present. No wheezing, rhonchi or rales.  Abdominal:     General: There is distension.     Palpations: Abdomen is soft.     Tenderness: There is no abdominal tenderness.  Musculoskeletal:     Right ankle: Swelling present.     Left ankle: Swelling present.  Skin:    General: Skin is warm.     Findings: No rash.  Neurological:     Mental Status: She is  alert.     Comments: Answers some yes or no questions.  Does not elaborate much.       Data Reviewed: Basic Metabolic Panel: Recent Labs  Lab 10/11/20 0756 10/12/20 0500 10/13/20 0613 10/14/20 0754 10/14/20 1047  NA 124* 125* 126* 124* 122*  K 4.6 4.6 4.4 4.2 4.1  CL 98 99 100 96* 93*  CO2 18* 18* 18* 18* 19*  GLUCOSE 95 105* 108* 99 127*  BUN 36* 41* 47* 50* 51*  CREATININE 1.52* 1.50* 1.64* 1.71* 1.64*  CALCIUM 8.3* 8.1* 8.4* 8.7* 8.7*  MG  --   --   --  1.9  --    Liver Function Tests: Recent Labs  Lab 10/08/20 0818 10/10/20 1226 10/11/20 0756 10/12/20 0500 10/14/20 1047  AST 96* 45* 38 86* 38  ALT 68* 42 31 39 27  ALKPHOS 257* 202* 173* 354* 246*  BILITOT 1.6* 1.9* 2.1* 5.4* 2.3*  PROT 5.9* 5.5* 5.2* 5.0* 5.6*  ALBUMIN 2.7* 2.5* 2.3* 2.1* 2.7*   Recent Labs  Lab 10/10/20 1226  LIPASE 24   CBC: Recent Labs  Lab 10/08/20 0818 10/10/20 1226 10/11/20 0756 10/12/20 0500 10/13/20 0613 10/14/20 0754  WBC 3.7* 7.4 8.3 7.0 2.2* 1.3*  NEUTROABS 2.3 7.0  --   --   --   --  HGB 7.9* 8.9* 8.4* 8.0* 8.1* 8.1*  HCT 23.5* 25.9* 25.4* 23.7* 23.3* 23.2*  MCV 92.2 89.6 90.7 90.5 87.6 87.5  PLT 200 146* 128* 116* 86* 60*     Recent Results (from the past 240 hour(s))  Resp Panel by RT-PCR (Flu A&B, Covid) Nasopharyngeal Swab     Status: None   Collection Time: 10/10/20  2:07 PM   Specimen: Nasopharyngeal Swab; Nasopharyngeal(NP) swabs in vial transport medium  Result Value Ref Range Status   SARS Coronavirus 2 by RT PCR NEGATIVE NEGATIVE Final    Comment: (NOTE) SARS-CoV-2 target nucleic acids are NOT DETECTED.  The SARS-CoV-2 RNA is generally detectable in upper respiratory specimens during the acute phase of infection. The lowest concentration of SARS-CoV-2 viral copies this assay can detect is 138 copies/mL. A negative result does not preclude SARS-Cov-2 infection and should not be used as the sole basis for treatment or other patient management  decisions. A negative result may occur with  improper specimen collection/handling, submission of specimen other than nasopharyngeal swab, presence of viral mutation(s) within the areas targeted by this assay, and inadequate number of viral copies(<138 copies/mL). A negative result must be combined with clinical observations, patient history, and epidemiological information. The expected result is Negative.  Fact Sheet for Patients:  EntrepreneurPulse.com.au  Fact Sheet for Healthcare Providers:  IncredibleEmployment.be  This test is no t yet approved or cleared by the [place] FDA and  has been authorized for detection and/or diagnosis of SARS-CoV-2 by FDA under an Emergency Use Authorization (EUA). This EUA will remain  in effect (meaning this test can be used) for the duration of the COVID-19 declaration under Section 564(b)(1) of the Act, 21 U.S.C.section 360bbb-3(b)(1), unless the authorization is terminated  or revoked sooner.       Influenza A by PCR NEGATIVE NEGATIVE Final   Influenza B by PCR NEGATIVE NEGATIVE Final    Comment: (NOTE) The Xpert Xpress SARS-CoV-2/FLU/RSV plus assay is intended as an aid in the diagnosis of influenza from Nasopharyngeal swab specimens and should not be used as a sole basis for treatment. Nasal washings and aspirates are unacceptable for Xpert Xpress SARS-CoV-2/FLU/RSV testing.  Fact Sheet for Patients: EntrepreneurPulse.com.au  Fact Sheet for Healthcare Providers: IncredibleEmployment.be  This test is not yet approved or cleared by the [place] FDA and has been authorized for detection and/or diagnosis of SARS-CoV-2 by FDA under an Emergency Use Authorization (EUA). This EUA will remain in effect (meaning this test can be used) for the duration of the COVID-19 declaration under Section 564(b)(1) of the Act, 21 U.S.C. section 360bbb-3(b)(1), unless the  authorization is terminated or revoked.  Performed at [facility], [address], [place], [place] [address]      Studies: No results found.  Scheduled Meds: . ascorbic acid  1,000 mg Oral Once per day on Mon Wed Fri  . aspirin EC  81 mg Oral Once per day on Mon Wed Fri  . calcium-vitamin D  1 tablet Oral Daily  . Chlorhexidine Gluconate Cloth  6 each Topical Daily  . enoxaparin (LOVENOX) injection  30 mg Subcutaneous Q24H  . feeding supplement  1 Container Oral TID BM  . midodrine  5 mg Oral TID WC  . multivitamin with minerals  1 tablet Oral Daily  . nystatin  5 mL Oral QID  . spironolactone  50 mg Oral Daily  . Tbo-filgastrim (GRANIX) SQ  300 mcg Subcutaneous Once   Continuous Infusions: . methocarbamol (ROBAXIN) IV  Brief history.  87 year old female with history of metastatic pancreatic adenocarcinoma.  History of breast and colon cancer, type 2 diabetes, MGUS, chronic kidney disease presents with weakness and inability to walk.  She was also found to have a low sodium.  Pancytopenia with counts lowering over the past couple days.  Assessment/Plan:  1. Hyponatremia.  Patient initially was started on IV fluid hydration.  Urine sodium was very low so I stopped IV fluids and started IV albumin and Lasix twice a day and midodrine to increase blood pressure.  Patient sodium still low despite treatments.  We will try a dose of tolvaptan today and get nephrology consultation.  Patient also has very poor appetite. 2. Generalized weakness with the inability to walk.  Physical therapy recommends rehab. 3. Metastatic pancreatic adenocarcinoma.  Appreciate palliative care consultation.  Patient already a DNR.  Covering oncologist did see over the weekend and Dr. Grayland Ormond will follow up today.  PET scan on 09/24/2020 did show hypermetabolic left retroperitoneal and periaortic lesions and also pancreatic lesions and omental disease with ascites.  Patient declined paracentesis.   Patient on Aldactone.  Patient will have a chemo holiday. 4. Acute kidney injury on chronic kidney disease stage IIIb.  Creatinine was as low as 1.5 during the hospital course.  Today's creatinine went up to 1.71.  Most recent creatinine 1.64. 5. Pancytopenia secondary to chemotherapy.  White blood cell count down to 1.3 will give a dose of Granix.  Hemoglobin stable at 8.1.  Platelet count dropped to 60.  We will hold Lovenox. 6. Chronic left-sided hydronephrosis 7. Impaired fasting glucose with last hemoglobin A1c 5.9 8. History of temporal arteritis. 9. Constipation improved after Ensure.  MiraLAX made as needed 10. Thrush on tongue.  Nystatin swish and swallow has improved 11. Patient is a DO NOT RESUSCITATE      Code Status:     Code Status Orders  (From admission, onward)         Start     Ordered   10/10/20 1719  Do not attempt resuscitation (DNR)  Continuous       Question Answer Comment  In the event of cardiac or respiratory ARREST Do not call a "code blue"   In the event of cardiac or respiratory ARREST Do not perform Intubation, CPR, defibrillation or ACLS   In the event of cardiac or respiratory ARREST Use medication by any route, position, wound care, and other measures to relive pain and suffering. May use oxygen, suction and manual treatment of airway obstruction as needed for comfort.      10/10/20 1718        Code Status History    This patient has a current code status but no historical code status.   Advance Care Planning Activity     Family Communication: Spoke with daughter at the bedside Disposition Plan: Status is: Inpatient  Dispo: The patient is from: Home              Anticipated d/c is to: Rehab once sodium up a little higher              Patient currently will need palliative care to follow over at the rehab   Difficult to place patient.  Hopefully not.  Consultants:  Palliative care  Oncology  Nephrology  Time spent: 28  minutes  [place]

## 2020-10-14 NOTE — Progress Notes (Signed)
[place]  [phone]) [phone] [phone]) [phone]  ID: Veronica Colon OB: [birth year]  MR#: [id number]  RDE#:[id number]  Patient Care Team: Delsa Grana, PA-C as PCP - General (Family Medicine) Marlowe Sax, MD as Referring Physician (Internal Medicine) Birder Robson, MD as Referring Physician (Ophthalmology) Lloyd Huger, MD as Consulting Physician (Hematology and Oncology)  CHIEF COMPLAINT: Stage IV pancreatic cancer, hyponatremia, pancytopenia, failure to thrive.  INTERVAL HISTORY: Patient feels improved since admission, but continues to have significant weakness and fatigue and poor appetite.  She does not complain of pain today.  Patient offers no further specific complaints.  REVIEW OF SYSTEMS:   Review of Systems  Constitutional: Positive for malaise/fatigue. Negative for fever and weight loss.  Respiratory: Negative.  Negative for cough, hemoptysis and shortness of breath.   Cardiovascular: Negative.  Negative for chest pain and leg swelling.  Gastrointestinal: Negative.  Negative for abdominal pain.  Genitourinary: Negative.  Negative for dysuria.  Musculoskeletal: Negative.  Negative for back pain.  Skin: Negative.  Negative for rash.  Neurological: Positive for weakness. Negative for dizziness, focal weakness and headaches.  Psychiatric/Behavioral: Negative.  The patient is not nervous/anxious.     As per HPI. Otherwise, a complete review of systems is negative.  PAST MEDICAL HISTORY: Past Medical History:  Diagnosis Date  . Allergy   . Anemia associated with chemotherapy   . Anxiety and depression   . Anxiety and depression   . Arthritis   . Breast cancer ([place]) 1990   LT MASTECTOMY  . Cancer ([place]) 1990   LT BREAST MASTECTOMY  . CKD (chronic kidney disease), stage III ([place])   . Diabetes mellitus without complication ([place])   . Family history of breast cancer   . Family history of pancreatic cancer   . GERD  (gastroesophageal reflux disease)   . Hypertension   . Hypertensive CKD (chronic kidney disease)   . Personal history of chemotherapy   . Status post chemotherapy    breast cancer  . Temporal arteritis ([place]) 10/28/2016   Overview:  Right temporal arteritis  Biopsy proven 11/08/2016    PAST SURGICAL HISTORY: Past Surgical History:  Procedure Laterality Date  . ABDOMINAL HYSTERECTOMY    . BREAST SURGERY    . COLONOSCOPY WITH PROPOFOL N/A 06/04/2015   Procedure: COLONOSCOPY WITH PROPOFOL;  Surgeon: Manya Silvas, MD;  Location: [facility] ENDOSCOPY;  Service: Endoscopy;  Laterality: N/A;  . IR IMAGING GUIDED PORT INSERTION  05/09/2020  . MASTECTOMY Left 1990   BREAST CA  . TEMPORAL ARTERY BIOPSY / LIGATION  11/2016    FAMILY HISTORY: Family History  Problem Relation Age of Onset  . Breast cancer Mother 81  . Lung cancer Father        lung  . Cancer Maternal Aunt        unk types  . Cancer Maternal Uncle        unk types  . Pancreatic cancer Cousin     ADVANCED DIRECTIVES (Y/N):  @ADVDIR @  HEALTH MAINTENANCE: Social History   Tobacco Use  . Smoking status: Never Smoker  . Smokeless tobacco: Never Used  Vaping Use  . Vaping Use: Never used  Substance Use Topics  . Alcohol use: No    Alcohol/week: 0.0 standard drinks  . Drug use: No     Colonoscopy:  PAP:  Bone density:  Lipid panel:  Allergies  Allergen Reactions  . Hydrochlorothiazide Other (See Comments)    Hypokalemia   . Lisinopril Other (See  Comments) and Cough    Hands numb   . Metformin And Related     Fatigue, "heart was beating slower"    Current Facility-Administered Medications  Medication Dose Route Frequency Provider Last Rate Last Admin  . acetaminophen (TYLENOL) tablet 650 mg  650 mg Oral Q6H PRN Clarnce Flock, MD       Or  . acetaminophen (TYLENOL) suppository 650 mg  650 mg Rectal Q6H PRN Clarnce Flock, MD      . ascorbic acid (VITAMIN C) tablet 1,000 mg  1,000 mg Oral Once  per day on Mon Wed Fri Clarnce Flock, MD   1,000 mg at 10/13/20 1324  . calcium-vitamin D (OSCAL WITH D) 500-200 MG-UNIT per tablet 1 tablet  1 tablet Oral Daily Clarnce Flock, MD   1 tablet at 10/14/20 1004  . Chlorhexidine Gluconate Cloth 2 % PADS 6 each  6 each Topical Daily Clarnce Flock, MD   6 each at 10/14/20 1000  . feeding supplement (BOOST / RESOURCE BREEZE) liquid 1 Container  1 Container Oral TID BM Loletha Grayer, MD   1 Container at 10/13/20 2253  . methocarbamol (ROBAXIN) 500 mg in dextrose 5 % 50 mL IVPB  500 mg Intravenous Q6H PRN Clarnce Flock, MD      . midodrine (PROAMATINE) tablet 5 mg  5 mg Oral TID WC Loletha Grayer, MD   5 mg at 10/14/20 1252  . multivitamin with minerals tablet 1 tablet  1 tablet Oral Daily Clarnce Flock, MD   1 tablet at 10/14/20 1004  . nystatin (MYCOSTATIN) 100000 UNIT/ML suspension 500,000 Units  5 mL Oral QID Loletha Grayer, MD   500,000 Units at 10/14/20 1756  . ondansetron (ZOFRAN) tablet 4 mg  4 mg Oral Q6H PRN Clarnce Flock, MD       Or  . ondansetron [facility]) injection 4 mg  4 mg Intravenous Q6H PRN Clarnce Flock, MD      . oxyCODONE (Oxy IR/ROXICODONE) immediate release tablet 5 mg  5 mg Oral Q4H PRN Clarnce Flock, MD      . polyethylene glycol (MIRALAX / GLYCOLAX) packet 17 g  17 g Oral Daily PRN Wieting, Richard, MD      . prochlorperazine (COMPAZINE) tablet 10 mg  10 mg Oral Q6H PRN Clarnce Flock, MD      . spironolactone (ALDACTONE) tablet 50 mg  50 mg Oral Daily Clarnce Flock, MD   50 mg at 10/14/20 1004  . traZODone (DESYREL) tablet 25 mg  25 mg Oral QHS PRN Clarnce Flock, MD        OBJECTIVE: Vitals:   10/14/20 1625 10/14/20 2053  BP: 131/70 127/64  Pulse: 78 77  Resp: 16 16  Temp: 99.2 F (37.3 C) 98 F (36.7 C)  SpO2: 97% 97%     Body mass index is 28.39 kg/m.    ECOG FS:3 - Symptomatic, >50% confined to bed  General: Thin, no acute distress. Eyes: Pink  conjunctiva, anicteric sclera. HEENT: Normocephalic, moist mucous membranes. Lungs: No audible wheezing or coughing. Heart: Regular rate and rhythm. Abdomen: Soft, nontender, no obvious distention. Musculoskeletal: No edema, cyanosis, or clubbing. Neuro: Alert, answering all questions appropriately. Cranial nerves grossly intact. Skin: No rashes or petechiae noted. Psych: Normal affect.  LAB RESULTS:  Lab Results  Component Value Date   NA 123 (L) 10/14/2020   K 4.1 10/14/2020   CL 93 (L) 10/14/2020   CO2  19 (L) 10/14/2020   GLUCOSE 127 (H) 10/14/2020   BUN 51 (H) 10/14/2020   CREATININE 1.64 (H) 10/14/2020   CALCIUM 8.7 (L) 10/14/2020   PROT 5.6 (L) 10/14/2020   ALBUMIN 2.7 (L) 10/14/2020   AST 38 10/14/2020   ALT 27 10/14/2020   ALKPHOS 246 (H) 10/14/2020   BILITOT 2.3 (H) 10/14/2020   GFRNONAA 30 (L) 10/14/2020   GFRAA 36 (L) 04/11/2020    Lab Results  Component Value Date   WBC 1.3 (LL) 10/14/2020   NEUTROABS 7.0 10/10/2020   HGB 8.1 (L) 10/14/2020   HCT 23.2 (L) 10/14/2020   MCV 87.5 10/14/2020   PLT 60 (L) 10/14/2020     STUDIES: NM PET Image Restag (PS) Skull Base To Thigh  Result Date: 09/24/2020 CLINICAL DATA:  Aortic Atherosclerois (ICD10-170.0) treatment strategy for pancreatic cancer. EXAM: NUCLEAR MEDICINE PET SKULL BASE TO THIGH TECHNIQUE: 8.1 mCi F-18 FDG was injected intravenously. Full-ring PET imaging was performed from the skull base to thigh after the radiotracer. CT data was obtained and used for attenuation correction and anatomic localization. Fasting blood glucose: 111 mg/dl COMPARISON:  04/17/2020. FINDINGS: Mediastinal blood pool activity: SUV max 3.1 Liver activity: SUV max NA NECK: No hypermetabolic lymph nodes in the neck. Incidental CT findings: none CHEST: No hypermetabolic mediastinal or hilar nodes. No suspicious pulmonary nodules on the CT scan. Incidental CT findings: Coronary artery calcification is evident. Atherosclerotic  calcification is noted in the wall of the thoracic aorta. Right Port-A-Cath tip is positioned at the SVC/RA junction. ABDOMEN/PELVIS: Hypermetabolism seen in the pancreatic lesion previously has decreased slightly in the interval with SUV max = 5.2 today compared to 7.7 previously. The left para-aortic retroperitoneal lesion measures 1.7 x 1.6 cm today and shows low level hypermetabolism with SUV max = 3.8. This is lesion that measured 2.6 x 1.3 cm on previous PET-CT with SUV max = 6.6 and represents the site of left ureteral obstruction. Omental disease seen previously has decreased on CT imaging. Low level FDG accumulation identified in a 2.5 cm left paramidline omental lesion (165/3) with SUV max = 4.0 1.7 cm small midline omental lesion just deep to the umbilicus on 628/3 demonstrates SUV max = 3.8. Incidental CT findings: Left hydronephrosis is similar to prior. Moderate volume ascites is new in the interval. There is abdominal aortic atherosclerosis without aneurysm. Gallbladder is distended SKELETON: No focal hypermetabolic activity to suggest skeletal metastasis. Incidental CT findings: No worrisome lytic or sclerotic osseous abnormality. IMPRESSION: 1. Interval slight decrease in hypermetabolism associated with the patient's known pancreatic lesion. Main duct dilatation in the tail of pancreas is similar to prior. 2. Hypermetabolic left retroperitoneal/para-aortic lesion measures slightly smaller today with associated interval decrease in hypermetabolic FDG accumulation. This lesion obstructs the left ureter with similar appearance of left hydroureteronephrosis. 3. Omental disease identified on the previous study has decreased in the interval but persists. 4. Despite the generalized trend towards improvement on today's study, there is new moderate volume ascites in the abdomen and pelvis. Electronically Signed   By: Misty Stanley M.D.   On: 09/24/2020 15:40   DG Chest Portable 1 View  Result Date:  10/10/2020 CLINICAL DATA:  Weakness. EXAM: PORTABLE CHEST 1 VIEW COMPARISON:  August 07, 2018. FINDINGS: The heart size and mediastinal contours are within normal limits. No pneumothorax or significant pleural effusion is noted. Right internal jugular Port-A-Cath is noted with distal tip in expected position of cavoatrial junction. Hypoinflation of the lungs is noted with minimal bibasilar  subsegmental atelectasis. The visualized skeletal structures are unremarkable. IMPRESSION: Hypoinflation of the lungs with minimal bibasilar subsegmental atelectasis. Electronically Signed   By: Marijo Conception M.D.   On: 10/10/2020 13:10    ASSESSMENT: Stage IV pancreatic cancer, hyponatremia, pancytopenia, failure to thrive.  PLAN:    1.  Stage IV pancreatic cancer, patient last received chemotherapy with single agent gemcitabine approximately 1 week ago.  Given her decreased performance status, will hold chemotherapy at this time.  Return to clinic on October 29, 2020 for hospital follow-up and further evaluation. 2.  Hyponatremia: Essentially unchanged with a sodium of 123.  Monitor. 3.  Pancytopenia: Likely secondary to recent chemotherapy.  Expect white blood cell count and platelet count to hit their nadir in several days and then trended back up. 4.  Hemoglobin: Patient received 1 unit of packed red blood cells last week.  Hemoglobin is stable at 8.1. 5.  Chronic renal insufficiency: Chronic and unchanged.  Patient's creatinine is essentially stable at 1.64. 6.  Failure to thrive: Hold chemotherapy as above.  Patient is likely going to [facility] for rehab upon discharge possibly tomorrow.  Appreciate consult, will follow.   Lloyd Huger, MD   10/14/2020 10:03 PM

## 2020-10-14 NOTE — Progress Notes (Signed)
IVT: Spoke with Rn for order clarification,stated upon reassessment; line is patent and dsg ok.

## 2020-10-15 ENCOUNTER — Ambulatory Visit: Payer: Medicare Other | Admitting: Oncology

## 2020-10-15 ENCOUNTER — Ambulatory Visit: Payer: Medicare Other

## 2020-10-15 ENCOUNTER — Other Ambulatory Visit: Payer: Medicare Other

## 2020-10-15 DIAGNOSIS — E871 Hypo-osmolality and hyponatremia: Secondary | ICD-10-CM | POA: Diagnosis not present

## 2020-10-15 LAB — BASIC METABOLIC PANEL
Anion gap: 11 (ref 5–15)
BUN: 56 mg/dL — ABNORMAL HIGH (ref 8–23)
CO2: 19 mmol/L — ABNORMAL LOW (ref 22–32)
Calcium: 9.1 mg/dL (ref 8.9–10.3)
Chloride: 96 mmol/L — ABNORMAL LOW (ref 98–111)
Creatinine, Ser: 1.79 mg/dL — ABNORMAL HIGH (ref 0.44–1.00)
GFR, Estimated: 27 mL/min — ABNORMAL LOW (ref >60)
Glucose, Bld: 101 mg/dL — ABNORMAL HIGH (ref 70–99)
Potassium: 4.4 mmol/L (ref 3.5–5.1)
Sodium: 126 mmol/L — ABNORMAL LOW (ref 135–145)

## 2020-10-15 LAB — CBC
HCT: 24.5 % — ABNORMAL LOW (ref 36.0–46.0)
Hemoglobin: 8.4 g/dL — ABNORMAL LOW (ref 12.0–15.0)
MCH: 30.4 pg (ref 26.0–34.0)
MCHC: 34.3 g/dL (ref 30.0–36.0)
MCV: 88.8 fL (ref 80.0–100.0)
Platelets: 53 10*3/uL — ABNORMAL LOW (ref 150–400)
RBC: 2.76 MIL/uL — ABNORMAL LOW (ref 3.87–5.11)
RDW: 17.6 % — ABNORMAL HIGH (ref 11.5–15.5)
WBC: 3.3 10*3/uL — ABNORMAL LOW (ref 4.0–10.5)
nRBC: 0 % (ref 0.0–0.2)

## 2020-10-15 LAB — SARS CORONAVIRUS 2 (TAT 6-24 HRS): SARS Coronavirus 2: NEGATIVE

## 2020-10-15 LAB — SODIUM: Sodium: 125 mmol/L — ABNORMAL LOW (ref 135–145)

## 2020-10-15 NOTE — Progress Notes (Signed)
MEDICATION RELATED CONSULT NOTE - INITIAL Pharmacy Consult for tolvaptan [facility]) per pharmacy monitoring  Indication: low Na 124  Allergies  Allergen Reactions  . Hydrochlorothiazide Other (See Comments)    Hypokalemia   . Lisinopril Other (See Comments) and Cough    Hands numb   . Metformin And Related     Fatigue, "heart was beating slower"    Patient Measurements: Height: 5\' 3"  (160 cm) Weight: 72.7 kg (160 lb 4.4 oz) IBW/kg (Calculated) : 52.4 Adjusted Body Weight:    Vital Signs: Temp: 98.6 F (37 C) (04/06 0107) BP: 119/66 (04/06 0107) Pulse Rate: 84 (04/06 0107) Intake/Output from previous day: 04/05 0701 - 04/06 0700 In: 240 [P.O.:240] Out: 900 [Urine:900] Intake/Output from this shift: No intake/output data recorded.  Labs: Recent Labs    10/12/20 0500 10/13/20 0613 10/14/20 0754 10/14/20 1047 10/15/20 0144  WBC 7.0 2.2* 1.3*  --  3.3*  HGB 8.0* 8.1* 8.1*  --  8.4*  HCT 23.7* 23.3* 23.2*  --  24.5*  PLT 116* 86* 60*  --  53*  CREATININE 1.50* 1.64* 1.71* 1.64*  --   MG  --   --  1.9  --   --   ALBUMIN 2.1*  --   --  2.7*  --   PROT 5.0*  --   --  5.6*  --   AST 86*  --   --  38  --   ALT 39  --   --  27  --   ALKPHOS 354*  --   --  246*  --   BILITOT 5.4*  --   --  2.3*  --   BILIDIR  --   --   --  1.0*  --   IBILI  --   --   --  1.3*  --    Estimated Creatinine Clearance: 23.1 mL/min (A) (by C-G formula based on SCr of 1.64 mg/dL (H)).  Medical History: Past Medical History:  Diagnosis Date  . Allergy   . Anemia associated with chemotherapy   . Anxiety and depression   . Anxiety and depression   . Arthritis   . Breast cancer ([place]) 1990   LT MASTECTOMY  . Cancer ([place]) 1990   LT BREAST MASTECTOMY  . CKD (chronic kidney disease), stage III ([place])   . Diabetes mellitus without complication ([place])   . Family history of breast cancer   . Family history of pancreatic cancer   . GERD (gastroesophageal reflux disease)   . Hypertension   .  Hypertensive CKD (chronic kidney disease)   . Personal history of chemotherapy   . Status post chemotherapy    breast cancer  . Temporal arteritis ([place]) 10/28/2016   Overview:  Right temporal arteritis  Biopsy proven 11/08/2016    Medications:  Scheduled:  . ascorbic acid  1,000 mg Oral Once per day on Mon Wed Fri  . calcium-vitamin D  1 tablet Oral Daily  . Chlorhexidine Gluconate Cloth  6 each Topical Daily  . feeding supplement  1 Container Oral TID BM  . midodrine  5 mg Oral TID WC  . multivitamin with minerals  1 tablet Oral Daily  . nystatin  5 mL Oral QID  . spironolactone  50 mg Oral Daily   Infusions:  . methocarbamol (ROBAXIN) IV      Assessment: 87 yo w/ Na 124. Tolvaptan 15 mg po x 1 ordered.   Goal of Therapy: Na WNL  Plan:  4/5  0754 Na 124 4/5 1047 Na 122 (tolvaptan given at 1252) 4/5 1812 Na 123 4/6 0144 Na 125  Per Oncology note (4/5), possible pt d/c later today to [facility] for rehab.   Monitor and plan to Hold tolvaptan [facility]) and notify MD if sodium increases more than 8 mEq/L in 8 hours OR greater than 12 mEq/L in 24 hours.  Renda Rolls, PharmD, [facility] 10/15/2020 3:01 AM

## 2020-10-15 NOTE — Progress Notes (Signed)
Physical Therapy Treatment Patient Details Name: Veronica Colon MRN: [id number] DOB: 10-26-1931 Today's Date: 10/15/2020    History of Present Illness Pt is an 87 y.o. female with hx of metastatic pancreatic cancer currently undergoing palliative chemotherapy, history of breast and colon cancer in remission, type 2 diabetes, MGUS, CKD, who presents with profound weakness.  MD assessment includes: physical deconditioning, metastatic pancreatic cancer, AKI, elevated troponin, hyponatremia, and anemia.    PT Comments    Pt resting in bed upon PT arrival and reporting recently getting a bath; pt agreeable to PT session with encouragement; pt's daughter arrived beginning of session and encouraging pt in general.  Tolerated LE ex's in bed with assist.  Min to mod assist semi-supine to sitting edge of bed; SBA for sitting balance; mod assist to stand from elevated bed x2 trials up to RW (L knee blocked and vc's for upright posture); and max assist sit to semi-supine in bed.  Pt declined additional standing attempts d/t overall weakness and fatigue.  HR and O2 sats on room air WFL during sessions activities.  Will continue to focus on strengthening and progressive functional mobility per pt tolerance.    Follow Up Recommendations  SNF     Equipment Recommendations  Other (comment) (TBD at next facility)    Recommendations for Other Services       Precautions / Restrictions Precautions Precautions: Fall Precaution Comments: R chest port Restrictions Weight Bearing Restrictions: No    Mobility  Bed Mobility Overal bed mobility: Needs Assistance Bed Mobility: Supine to Sit;Sit to Supine     Supine to sit: Min assist;Mod assist (mod assist for B LE's and min assist for trunk) Sit to supine: Max assist (assist for trunk and B LE's)   General bed mobility comments: vc's for technique    Transfers Overall transfer level: Needs assistance Equipment used: Rolling walker (2  wheeled) Transfers: Sit to/from Stand Sit to Stand: Mod assist         General transfer comment: x2 trials; L knee blocked; assist to initiate and come to stand but demonstrating flexed posture (pt unable to shift hips forward and shoulders back with vc's)  Ambulation/Gait             General Gait Details: pt fatigued with each stand (unable to attempt ambulation)   Stairs             Wheelchair Mobility    Modified Rankin (Stroke Patients Only)       Balance Overall balance assessment: Needs assistance Sitting-balance support: No upper extremity supported;Feet supported Sitting balance-Leahy Scale: Good Sitting balance - Comments: steady sitting reaching within BOS   Standing balance support: Bilateral upper extremity supported;During functional activity Standing balance-Leahy Scale: Fair Standing balance comment: requires support for static standing                            Cognition Arousal/Alertness: Awake/alert Behavior During Therapy: Flat affect Overall Cognitive Status: Within Functional Limits for tasks assessed                                        Exercises General Exercises - Lower Extremity Ankle Circles/Pumps: AROM;Strengthening;Both;10 reps;Supine Short Arc Quad: AAROM;Strengthening;Both;10 reps;Supine Heel Slides: AAROM;Strengthening;Both;10 reps;Supine Hip ABduction/ADduction: AAROM;Strengthening;Both;10 reps;Supine    General Comments   Nursing cleared pt for participation in physical therapy.  Pt  agreeable to PT session with encouragement.      Pertinent Vitals/Pain Pain Assessment: No/denies pain Pain Intervention(s): Limited activity within patient's tolerance;Monitored during session;Repositioned     Home Living                      Prior Function            PT Goals (current goals can now be found in the care plan section) Acute Rehab PT Goals Patient Stated Goal: to get stronger  and do more for myself PT Goal Formulation: With patient Time For Goal Achievement: 10/23/20 Potential to Achieve Goals: Fair Progress towards PT goals: Progressing toward goals    Frequency    Min 2X/week      PT Plan Current plan remains appropriate    Co-evaluation              AM-PAC PT "6 Clicks" Mobility   Outcome Measure  Help needed turning from your back to your side while in a flat bed without using bedrails?: A Lot Help needed moving from lying on your back to sitting on the side of a flat bed without using bedrails?: A Lot Help needed moving to and from a bed to a chair (including a wheelchair)?: Total Help needed standing up from a chair using your arms (e.g., wheelchair or bedside chair)?: A Lot Help needed to walk in hospital room?: Total Help needed climbing 3-5 steps with a railing? : Total 6 Click Score: 9    End of Session Equipment Utilized During Treatment: Gait belt Activity Tolerance: Patient limited by fatigue Patient left: in bed;with call bell/phone within reach;with bed alarm set;with family/visitor present;Other (comment) (B heels floating via pillow support) Nurse Communication: Mobility status;Precautions PT Visit Diagnosis: Muscle weakness (generalized) (M62.81);Other abnormalities of gait and mobility (R26.89);Adult, failure to thrive (R62.7)     Time: 7341-9379 PT Time Calculation (min) (ACUTE ONLY): 34 min  Charges:  $Therapeutic Exercise: 8-22 mins $Therapeutic Activity: 8-22 mins                    Leitha Bleak, PT 10/15/20, 11:55 AM

## 2020-10-15 NOTE — Progress Notes (Signed)
PROGRESS NOTE    Hiromi Knodel Smay  YQI:[id number] DOB: 1932-07-07 DOA: 10/10/2020 PCP: Delsa Grana, PA-C     Brief Narrative:  Cellie Dardis Carte is an 87 year old female with history of metastatic pancreatic adenocarcinoma, history of breast and colon cancer, type 2 diabetes, MGUS, chronic kidney disease presents with weakness and inability to walk.  She was also found to have a low sodium.  Pancytopenia with counts lowering over the past couple days.  New events last 24 hours / Subjective: Patient eating breakfast in bed, daughter at bedside.  Patient voices no new complaints or concerns.  Daughter states that patient seemed a little clammy this morning but that resolved after being repositioned in bed.  Assessment & Plan:   Active Problems:   H/O malignant neoplasm of breast   H/O malignant neoplasm of colon   CKD (chronic kidney disease), stage III (HCC)   Temporal arteritis (HCC)   Type 2 diabetes mellitus with obesity (HCC)   Monoclonal gammopathy of unknown significance (MGUS)   Primary pancreatic cancer with metastasis to other site [facility])   Dehydration   Generalized weakness   Metastatic adenocarcinoma to pancreas ([place])   Acute kidney injury superimposed on CKD ([place])   Hyponatremia   Anemia associated with chemotherapy   Thrombocytopenia (HCC)   Hydronephrosis, left   Impaired fasting glucose   Pancytopenia ([place])   Thrush   Palliative care encounter   Metastasis from pancreatic cancer ([place])   Hyponatremia -Patient was initially treated with IV fluid, then transition to IV albumin and Lasix.  However sodium continued to be low.  Started on tolvaptan, nephrology consulted -Improving, trend BMP   Hypotension -Continue midodrine   Metastatic pancreatic adenocarcinoma -Appreciate oncology, palliative care medicine -Oncology planning for chemo holiday, follow-up as outpatient 10/29/2020 -PET 09/2020 showed hypermetabolic left retroperitoneal and para-aortic lesions also  pancreatic lesion and omental disease and ascites. Patient had declined paracentesis and treated with spironolactone  Pancytopenia secondary to chemotherapy -S/p Granix -Continue to monitor  AKI on CKD stage IIIb -Baseline Cr 1.5 -Has been ranging 1.5-1.79, continue to monitor closely   Failure to thrive, generalized weakness -Planning for SNF on discharge    DVT prophylaxis: Lovenox on hold due to pancytopenia  Code Status: DNR Family Communication: Daughter at bedside Disposition Plan:  Status is: Inpatient  Remains inpatient appropriate because:Inpatient level of care appropriate due to severity of illness   Dispo: The patient is from: Home              Anticipated d/c is to: SNF              Patient currently is not medically stable to d/c.  Continue to treat hyponatremia   Difficult to place patient No      Consultants:   Palliative care medicine  Oncology  Nephrology     Antimicrobials:  Anti-infectives (From admission, onward)   None        Objective: Vitals:   10/14/20 1625 10/14/20 2053 10/15/20 0107 10/15/20 0428  BP: 131/70 127/64 119/66 123/69  Pulse: 78 77 84 84  Resp: 16 16 18 18   Temp: 99.2 F (37.3 C) 98 F (36.7 C) 98.6 F (37 C) 97.7 F (36.5 C)  TempSrc:      SpO2: 97% 97% 97% 96%  Weight:      Height:        Intake/Output Summary (Last 24 hours) at 10/15/2020 1042 Last data filed at 10/15/2020 1030 Gross per 24 hour  Intake  0 ml  Output 1900 ml  Net -1900 ml   Filed Weights   10/10/20 1058 10/10/20 1715  Weight: 63.5 kg 72.7 kg    Examination:  General exam: Appears calm and comfortable, frail appearing  Respiratory system: Clear to auscultation. Respiratory effort normal. No respiratory distress. No conversational dyspnea.  Cardiovascular system: S1 & S2 heard, RRR. +murmur. No pedal edema. Gastrointestinal system: Abdomen is nondistended, soft and nontender. Normal bowel sounds heard. Central nervous system:  Alert and oriented. No focal neurological deficits. Speech clear.  Extremities: Symmetric in appearance  Skin: No rashes, lesions or ulcers on exposed skin  Psychiatry: Judgement and insight appear normal. Mood & affect appropriate.   Data Reviewed: I have personally reviewed following labs and imaging studies  CBC: Recent Labs  Lab 10/10/20 1226 10/11/20 0756 10/12/20 0500 10/13/20 0613 10/14/20 0754 10/15/20 0144  WBC 7.4 8.3 7.0 2.2* 1.3* 3.3*  NEUTROABS 7.0  --   --   --   --   --   HGB 8.9* 8.4* 8.0* 8.1* 8.1* 8.4*  HCT 25.9* 25.4* 23.7* 23.3* 23.2* 24.5*  MCV 89.6 90.7 90.5 87.6 87.5 88.8  PLT 146* 128* 116* 86* 60* 53*   Basic Metabolic Panel: Recent Labs  Lab 10/12/20 0500 10/13/20 0613 10/14/20 0754 10/14/20 1047 10/14/20 1812 10/15/20 0144 10/15/20 0803  NA 125* 126* 124* 122* 123* 125* 126*  K 4.6 4.4 4.2 4.1  --   --  4.4  CL 99 100 96* 93*  --   --  96*  CO2 18* 18* 18* 19*  --   --  19*  GLUCOSE 105* 108* 99 127*  --   --  101*  BUN 41* 47* 50* 51*  --   --  56*  CREATININE 1.50* 1.64* 1.71* 1.64*  --   --  1.79*  CALCIUM 8.1* 8.4* 8.7* 8.7*  --   --  9.1  MG  --   --  1.9  --   --   --   --    GFR: Estimated Creatinine Clearance: 21.1 mL/min (A) (by C-G formula based on SCr of 1.79 mg/dL (H)). Liver Function Tests: Recent Labs  Lab 10/10/20 1226 10/11/20 0756 10/12/20 0500 10/14/20 1047  AST 45* 38 86* 38  ALT 42 31 39 27  ALKPHOS 202* 173* 354* 246*  BILITOT 1.9* 2.1* 5.4* 2.3*  PROT 5.5* 5.2* 5.0* 5.6*  ALBUMIN 2.5* 2.3* 2.1* 2.7*   Recent Labs  Lab 10/10/20 1226  LIPASE 24   No results for input(s): AMMONIA in the last 168 hours. Coagulation Profile: No results for input(s): INR, PROTIME in the last 168 hours. Cardiac Enzymes: No results for input(s): CKTOTAL, CKMB, CKMBINDEX, TROPONINI in the last 168 hours. BNP (last 3 results) No results for input(s): PROBNP in the last 8760 hours. HbA1C: No results for input(s): HGBA1C in  the last 72 hours. CBG: No results for input(s): GLUCAP in the last 168 hours. Lipid Profile: No results for input(s): CHOL, HDL, LDLCALC, TRIG, CHOLHDL, LDLDIRECT in the last 72 hours. Thyroid Function Tests: No results for input(s): TSH, T4TOTAL, FREET4, T3FREE, THYROIDAB in the last 72 hours. Anemia Panel: No results for input(s): VITAMINB12, FOLATE, FERRITIN, TIBC, IRON, RETICCTPCT in the last 72 hours. Sepsis Labs: No results for input(s): PROCALCITON, LATICACIDVEN in the last 168 hours.  Recent Results (from the past 240 hour(s))  Resp Panel by RT-PCR (Flu A&B, Covid) Nasopharyngeal Swab     Status: None   Collection Time:  10/10/20  2:07 PM   Specimen: Nasopharyngeal Swab; Nasopharyngeal(NP) swabs in vial transport medium  Result Value Ref Range Status   SARS Coronavirus 2 by RT PCR NEGATIVE NEGATIVE Final    Comment: (NOTE) SARS-CoV-2 target nucleic acids are NOT DETECTED.  The SARS-CoV-2 RNA is generally detectable in upper respiratory specimens during the acute phase of infection. The lowest concentration of SARS-CoV-2 viral copies this assay can detect is 138 copies/mL. A negative result does not preclude SARS-Cov-2 infection and should not be used as the sole basis for treatment or other patient management decisions. A negative result may occur with  improper specimen collection/handling, submission of specimen other than nasopharyngeal swab, presence of viral mutation(s) within the areas targeted by this assay, and inadequate number of viral copies(<138 copies/mL). A negative result must be combined with clinical observations, patient history, and epidemiological information. The expected result is Negative.  Fact Sheet for Patients:  EntrepreneurPulse.com.au  Fact Sheet for Healthcare Providers:  IncredibleEmployment.be  This test is no t yet approved or cleared by the [place] FDA and  has been authorized for detection  and/or diagnosis of SARS-CoV-2 by FDA under an Emergency Use Authorization (EUA). This EUA will remain  in effect (meaning this test can be used) for the duration of the COVID-19 declaration under Section 564(b)(1) of the Act, 21 U.S.C.section 360bbb-3(b)(1), unless the authorization is terminated  or revoked sooner.       Influenza A by PCR NEGATIVE NEGATIVE Final   Influenza B by PCR NEGATIVE NEGATIVE Final    Comment: (NOTE) The Xpert Xpress SARS-CoV-2/FLU/RSV plus assay is intended as an aid in the diagnosis of influenza from Nasopharyngeal swab specimens and should not be used as a sole basis for treatment. Nasal washings and aspirates are unacceptable for Xpert Xpress SARS-CoV-2/FLU/RSV testing.  Fact Sheet for Patients: EntrepreneurPulse.com.au  Fact Sheet for Healthcare Providers: IncredibleEmployment.be  This test is not yet approved or cleared by the [place] FDA and has been authorized for detection and/or diagnosis of SARS-CoV-2 by FDA under an Emergency Use Authorization (EUA). This EUA will remain in effect (meaning this test can be used) for the duration of the COVID-19 declaration under Section 564(b)(1) of the Act, 21 U.S.C. section 360bbb-3(b)(1), unless the authorization is terminated or revoked.  Performed at [facility], [place], [place] [address]   SARS CORONAVIRUS 2 (TAT 6-24 HRS) Nasopharyngeal     Status: None   Collection Time: 10/14/20  2:10 PM   Specimen: Nasopharyngeal  Result Value Ref Range Status   SARS Coronavirus 2 NEGATIVE NEGATIVE Final    Comment: (NOTE) SARS-CoV-2 target nucleic acids are NOT DETECTED.  The SARS-CoV-2 RNA is generally detectable in upper and lower respiratory specimens during the acute phase of infection. Negative results do not preclude SARS-CoV-2 infection, do not rule out co-infections with other pathogens, and should not be used as the sole basis  for treatment or other patient management decisions. Negative results must be combined with clinical observations, patient history, and epidemiological information. The expected result is Negative.  Fact Sheet for Patients: SugarRoll.be  Fact Sheet for Healthcare Providers: https://www.woods-mathews.com/  This test is not yet approved or cleared by the [place] FDA and  has been authorized for detection and/or diagnosis of SARS-CoV-2 by FDA under an Emergency Use Authorization (EUA). This EUA will remain  in effect (meaning this test can be used) for the duration of the COVID-19 declaration under Se ction 564(b)(1) of the Act, 21  U.S.C. section 360bbb-3(b)(1), unless the authorization is terminated or revoked sooner.  Performed at [facility], [place] [address], [place], [place] [address]       Radiology Studies: No results found.    Scheduled Meds: . ascorbic acid  1,000 mg Oral Once per day on Mon Wed Fri  . calcium-vitamin D  1 tablet Oral Daily  . Chlorhexidine Gluconate Cloth  6 each Topical Daily  . feeding supplement  1 Container Oral TID BM  . midodrine  5 mg Oral TID WC  . multivitamin with minerals  1 tablet Oral Daily  . nystatin  5 mL Oral QID  . spironolactone  50 mg Oral Daily   Continuous Infusions: . methocarbamol (ROBAXIN) IV       LOS: 5 days      Time spent: 25 minutes   Dessa Phi, DO [facility] 10/15/2020, 10:42 AM   Available via Epic secure chat 7am-7pm After these hours, please refer to coverage provider listed on [email or web address]

## 2020-10-15 NOTE — Progress Notes (Signed)
[facility]  ROUNDING NOTE   Subjective:   Veronica Colon is a 87 year old African-American female with a past medical history of metastatic pancreatic cancer currently undergoing palliative chemotherapy, history of colon and breast cancer ( in remission), type 2 diabetes, CKD, and MGUS.  She presents to the ED with generalized weakness.  She is a known patient to the [facility].  Patient currently lives with daughter, who was at bedside.  Daughter states she feels her mother's health has declined since beginning chemotherapy.  This includes loss of appetite and weakness.  We have been consulted due to to patient's hyponatremia.  Patient seen resting in bed daughter at bedside Alert and oriented Continues to have poor appetite Denies nausea and shortness of breath  UOP 1.5L  Objective:  Vital signs in last 24 hours:  Temp:  [97.7 F (36.5 C)-99.2 F (37.3 C)] 97.7 F (36.5 C) (04/06 0428) Pulse Rate:  [71-84] 84 (04/06 0428) Resp:  [16-18] 18 (04/06 0428) BP: (119-131)/(64-72) 123/69 (04/06 0428) SpO2:  [96 %-100 %] 96 % (04/06 0428)  Weight change:  Filed Weights   10/10/20 1058 10/10/20 1715  Weight: 63.5 kg 72.7 kg    Intake/Output: I/O last 3 completed shifts: In: 480 [P.O.:480] Out: 3250 [Urine:3250]   Intake/Output this shift:  No intake/output data recorded.  Physical Exam: General: NAD, resting quietly in bed  Head: Normocephalic, atraumatic. Moist oral mucosal, hard of hearing   Lungs:  Clear to auscultation  Heart: Regular rate and rhythm  Abdomen:  Soft, nontender, non distended  Extremities: 1+ peripheral edema.  Neurologic: Alert, able to answer simple questions  Skin: No lesions       Basic Metabolic Panel: Recent Labs  Lab 10/12/20 0500 10/13/20 0613 10/14/20 0754 10/14/20 1047 10/14/20 1812 10/15/20 0144 10/15/20 0803  NA 125* 126* 124* 122* 123* 125* 126*  K 4.6 4.4 4.2 4.1  --   --  4.4  CL 99 100 96* 93*  --   --  96*  CO2 18*  18* 18* 19*  --   --  19*  GLUCOSE 105* 108* 99 127*  --   --  101*  BUN 41* 47* 50* 51*  --   --  56*  CREATININE 1.50* 1.64* 1.71* 1.64*  --   --  1.79*  CALCIUM 8.1* 8.4* 8.7* 8.7*  --   --  9.1  MG  --   --  1.9  --   --   --   --     Liver Function Tests: Recent Labs  Lab 10/10/20 1226 10/11/20 0756 10/12/20 0500 10/14/20 1047  AST 45* 38 86* 38  ALT 42 31 39 27  ALKPHOS 202* 173* 354* 246*  BILITOT 1.9* 2.1* 5.4* 2.3*  PROT 5.5* 5.2* 5.0* 5.6*  ALBUMIN 2.5* 2.3* 2.1* 2.7*   Recent Labs  Lab 10/10/20 1226  LIPASE 24   No results for input(s): AMMONIA in the last 168 hours.  CBC: Recent Labs  Lab 10/10/20 1226 10/11/20 0756 10/12/20 0500 10/13/20 0613 10/14/20 0754 10/15/20 0144  WBC 7.4 8.3 7.0 2.2* 1.3* 3.3*  NEUTROABS 7.0  --   --   --   --   --   HGB 8.9* 8.4* 8.0* 8.1* 8.1* 8.4*  HCT 25.9* 25.4* 23.7* 23.3* 23.2* 24.5*  MCV 89.6 90.7 90.5 87.6 87.5 88.8  PLT 146* 128* 116* 86* 60* 53*    Cardiac Enzymes: No results for input(s): CKTOTAL, CKMB, CKMBINDEX, TROPONINI in the last  168 hours.  BNP: Invalid input(s): POCBNP  CBG: No results for input(s): GLUCAP in the last 168 hours.  Microbiology: Results for orders placed or performed during the hospital encounter of 10/10/20  Resp Panel by RT-PCR (Flu A&B, Covid) Nasopharyngeal Swab     Status: None   Collection Time: 10/10/20  2:07 PM   Specimen: Nasopharyngeal Swab; Nasopharyngeal(NP) swabs in vial transport medium  Result Value Ref Range Status   SARS Coronavirus 2 by RT PCR NEGATIVE NEGATIVE Final    Comment: (NOTE) SARS-CoV-2 target nucleic acids are NOT DETECTED.  The SARS-CoV-2 RNA is generally detectable in upper respiratory specimens during the acute phase of infection. The lowest concentration of SARS-CoV-2 viral copies this assay can detect is 138 copies/mL. A negative result does not preclude SARS-Cov-2 infection and should not be used as the sole basis for treatment or other  patient management decisions. A negative result may occur with  improper specimen collection/handling, submission of specimen other than nasopharyngeal swab, presence of viral mutation(s) within the areas targeted by this assay, and inadequate number of viral copies(<138 copies/mL). A negative result must be combined with clinical observations, patient history, and epidemiological information. The expected result is Negative.  Fact Sheet for Patients:  EntrepreneurPulse.com.au  Fact Sheet for Healthcare Providers:  IncredibleEmployment.be  This test is no t yet approved or cleared by the [place] FDA and  has been authorized for detection and/or diagnosis of SARS-CoV-2 by FDA under an Emergency Use Authorization (EUA). This EUA will remain  in effect (meaning this test can be used) for the duration of the COVID-19 declaration under Section 564(b)(1) of the Act, 21 U.S.C.section 360bbb-3(b)(1), unless the authorization is terminated  or revoked sooner.       Influenza A by PCR NEGATIVE NEGATIVE Final   Influenza B by PCR NEGATIVE NEGATIVE Final    Comment: (NOTE) The Xpert Xpress SARS-CoV-2/FLU/RSV plus assay is intended as an aid in the diagnosis of influenza from Nasopharyngeal swab specimens and should not be used as a sole basis for treatment. Nasal washings and aspirates are unacceptable for Xpert Xpress SARS-CoV-2/FLU/RSV testing.  Fact Sheet for Patients: EntrepreneurPulse.com.au  Fact Sheet for Healthcare Providers: IncredibleEmployment.be  This test is not yet approved or cleared by the [place] FDA and has been authorized for detection and/or diagnosis of SARS-CoV-2 by FDA under an Emergency Use Authorization (EUA). This EUA will remain in effect (meaning this test can be used) for the duration of the COVID-19 declaration under Section 564(b)(1) of the Act, 21 U.S.C. section  360bbb-3(b)(1), unless the authorization is terminated or revoked.  Performed at [facility], [place], [place] [address]   SARS CORONAVIRUS 2 (TAT 6-24 HRS) Nasopharyngeal     Status: None   Collection Time: 10/14/20  2:10 PM   Specimen: Nasopharyngeal  Result Value Ref Range Status   SARS Coronavirus 2 NEGATIVE NEGATIVE Final    Comment: (NOTE) SARS-CoV-2 target nucleic acids are NOT DETECTED.  The SARS-CoV-2 RNA is generally detectable in upper and lower respiratory specimens during the acute phase of infection. Negative results do not preclude SARS-CoV-2 infection, do not rule out co-infections with other pathogens, and should not be used as the sole basis for treatment or other patient management decisions. Negative results must be combined with clinical observations, patient history, and epidemiological information. The expected result is Negative.  Fact Sheet for Patients: SugarRoll.be  Fact Sheet for Healthcare Providers: https://www.woods-mathews.com/  This test is not yet approved or  cleared by the [place] and  has been authorized for detection and/or diagnosis of SARS-CoV-2 by FDA under an Emergency Use Authorization (EUA). This EUA will remain  in effect (meaning this test can be used) for the duration of the COVID-19 declaration under Se ction 564(b)(1) of the Act, 21 U.S.C. section 360bbb-3(b)(1), unless the authorization is terminated or revoked sooner.  Performed at [facility], [place] [address], [place], [place] [address]     Coagulation Studies: No results for input(s): LABPROT, INR in the last 72 hours.  Urinalysis: No results for input(s): COLORURINE, LABSPEC, PHURINE, GLUCOSEU, HGBUR, BILIRUBINUR, KETONESUR, PROTEINUR, UROBILINOGEN, NITRITE, LEUKOCYTESUR in the last 72 hours.  Invalid input(s): APPERANCEUR    Imaging: No results found.   Medications:   .  methocarbamol (ROBAXIN) IV     . ascorbic acid  1,000 mg Oral Once per day on Mon Wed Fri  . calcium-vitamin D  1 tablet Oral Daily  . Chlorhexidine Gluconate Cloth  6 each Topical Daily  . feeding supplement  1 Container Oral TID BM  . midodrine  5 mg Oral TID WC  . multivitamin with minerals  1 tablet Oral Daily  . nystatin  5 mL Oral QID  . spironolactone  50 mg Oral Daily   acetaminophen **OR** acetaminophen, methocarbamol (ROBAXIN) IV, ondansetron **OR** ondansetron (ZOFRAN) IV, oxyCODONE, polyethylene glycol, prochlorperazine, traZODone  Assessment/ Plan:  [name] is a 87 y.o.  female with a past medical history of metastatic pancreatic cancer currently undergoing palliative chemotherapy, history of colon and breast cancer ( in remission), type 2 diabetes, CKD, and MGUS.  1. Hyponatremia likely due to excessive free water intake, NA-128    History received from daughter refers to decreasing appetite over several weeks.  Received lasix and Albumin to mobilize interstitial fluid Current level 125 Tolvaptan  Regular diet to encourage nutrition  Limit clear fluids Avoid hypotension.  Continue midodrine  2. Acute Kidney Injury on chronic kidney disease stage 3b with baseline creatinine 1.51 and GFR of 36 on 03/21/20 .  Acute kidney injury secondary to chemo induced loss of appetite Encourage oral nutrition  Lab Results  Component Value Date   CREATININE 1.79 (H) 10/15/2020   CREATININE 1.64 (H) 10/14/2020   CREATININE 1.71 (H) 10/14/2020    Intake/Output Summary (Last 24 hours) at 10/15/2020 1004 Last data filed at 10/15/2020 0200 Gross per 24 hour  Intake 240 ml  Output 1900 ml  Net -1660 ml   3. Diabetes mellitis with CKD - stable during this admission Lab Results  Component Value Date   HGBA1C 5.9 (H) 10/11/2020       LOS: 5 Radonna Bracher 4/6/202210:04 AM   Patient was seen and evaluated with [name], NP.  Plan of care was discussed with  patient as well as NP.  I agree with the note as documented with edits.

## 2020-10-15 NOTE — TOC Progression Note (Signed)
Transition of Care [facility]) - Progression Note    Patient Details  Name: Veronica Colon MRN: [id number] Date of Birth: January 25, 1932  Transition of Care [facility]) CM/SW [place], RN Phone Number: 10/15/2020, 1:22 PM  Clinical Narrative:   Patient expected to to to [facility] tomorrow.  Tammy aware insurance [place] with [facility] reference [id number].  Oncology notes confirm chemo holiday.  Daughter willing to take patient to oncology appointment from facility.  Tammy aware.      Expected Discharge Plan: Skilled Nursing Facility Barriers to Discharge: Other (comment) (Pt continue ongoing chemotherapy)  Expected Discharge Plan and Services Expected Discharge Plan: [place]   Discharge Planning Services: CM Consult   Living arrangements for the past 2 months: Single Family Home (Lives with her daughter)                                       Social Determinants of Health (SDOH) Interventions    Readmission Risk Interventions Readmission Risk Prevention Plan 10/14/2020  Transportation Screening Complete  PCP or Specialist Appt within 3-5 Days Complete  HRI or [place] Complete  Social Work Consult for [place] Planning/Counseling Complete  Palliative Care Screening Not Applicable  Medication Review Press photographer) Complete  Some recent data might be hidden

## 2020-10-15 NOTE — Progress Notes (Signed)
Bladder scan on patient showed appx 725 ml, MD notified and ordered a straight cath. [place] cath performed and about 556mL came out. Patient states she is comfortable and purewick placed for further urine OP monitoring. Night nurse informed.

## 2020-10-16 DIAGNOSIS — E871 Hypo-osmolality and hyponatremia: Secondary | ICD-10-CM | POA: Diagnosis not present

## 2020-10-16 LAB — CBC
HCT: 23.5 % — ABNORMAL LOW (ref 36.0–46.0)
Hemoglobin: 8.3 g/dL — ABNORMAL LOW (ref 12.0–15.0)
MCH: 30.9 pg (ref 26.0–34.0)
MCHC: 35.3 g/dL (ref 30.0–36.0)
MCV: 87.4 fL (ref 80.0–100.0)
Platelets: 37 10*3/uL — ABNORMAL LOW (ref 150–400)
RBC: 2.69 MIL/uL — ABNORMAL LOW (ref 3.87–5.11)
RDW: 17.7 % — ABNORMAL HIGH (ref 11.5–15.5)
WBC: 7.1 10*3/uL (ref 4.0–10.5)
nRBC: 1.3 % — ABNORMAL HIGH (ref 0.0–0.2)

## 2020-10-16 LAB — BASIC METABOLIC PANEL
Anion gap: 10 (ref 5–15)
BUN: 57 mg/dL — ABNORMAL HIGH (ref 8–23)
CO2: 21 mmol/L — ABNORMAL LOW (ref 22–32)
Calcium: 8.7 mg/dL — ABNORMAL LOW (ref 8.9–10.3)
Chloride: 95 mmol/L — ABNORMAL LOW (ref 98–111)
Creatinine, Ser: 1.71 mg/dL — ABNORMAL HIGH (ref 0.44–1.00)
GFR, Estimated: 29 mL/min — ABNORMAL LOW (ref >60)
Glucose, Bld: 120 mg/dL — ABNORMAL HIGH (ref 70–99)
Potassium: 4.4 mmol/L (ref 3.5–5.1)
Sodium: 126 mmol/L — ABNORMAL LOW (ref 135–145)

## 2020-10-16 MED ORDER — SODIUM CHLORIDE 0.9% FLUSH
10.0000 mL | INTRAVENOUS | Status: DC | PRN
  Administered 2020-10-17: 10 mL

## 2020-10-16 MED ORDER — SODIUM CHLORIDE 0.9% FLUSH
10.0000 mL | Freq: Two times a day (BID) | INTRAVENOUS | Status: DC
  Administered 2020-10-16 – 2020-10-22 (×12): 10 mL

## 2020-10-16 NOTE — Progress Notes (Signed)
PROGRESS NOTE    Veronica Colon  KCL:[id number] DOB: 30-Dec-1931 DOA: 10/10/2020 PCP: Delsa Grana, PA-C     Brief Narrative:  Veronica Colon is an 87 year old female with history of metastatic pancreatic adenocarcinoma, history of breast and colon cancer, type 2 diabetes, MGUS, chronic kidney disease presents with weakness and inability to walk.  She was also found to have a low sodium.  Pancytopenia with counts lowering over the past couple days.  New events last 24 hours / Subjective: Patient voices no new complaints.  Daughter at bedside.  Patient's appetite remains poor  Assessment & Plan:   Active Problems:   H/O malignant neoplasm of breast   H/O malignant neoplasm of colon   CKD (chronic kidney disease), stage III (HCC)   Temporal arteritis (HCC)   Type 2 diabetes mellitus with obesity (HCC)   Monoclonal gammopathy of unknown significance (MGUS)   Primary pancreatic cancer with metastasis to other site [facility])   Dehydration   Generalized weakness   Metastatic adenocarcinoma to pancreas ([place])   Acute kidney injury superimposed on CKD ([place])   Hyponatremia   Anemia associated with chemotherapy   Thrombocytopenia (HCC)   Hydronephrosis, left   Impaired fasting glucose   Pancytopenia ([place])   Thrush   Palliative care encounter   Metastasis from pancreatic cancer ([place])   Hyponatremia -Patient was initially treated with IV fluid, then transition to IV albumin and Lasix.  However sodium continued to be low.  Started on tolvaptan, nephrology consulted -Improving, trend BMP   Hypotension -Continue midodrine   Metastatic pancreatic adenocarcinoma -Appreciate oncology, palliative care medicine -Oncology planning for chemo holiday, follow-up as outpatient 10/29/2020 -PET 09/2020 showed hypermetabolic left retroperitoneal and para-aortic lesions also pancreatic lesion and omental disease and ascites. Patient had declined paracentesis and treated with spironolactone  Pancytopenia  secondary to chemotherapy -S/p Granix -Continue to monitor  AKI on CKD stage IIIb -Baseline Cr 1.5 -Has been ranging 1.5-1.79, continue to monitor closely   Failure to thrive, generalized weakness -Planning for SNF on discharge    DVT prophylaxis: Lovenox on hold due to pancytopenia  Code Status: DNR Family Communication: Daughter at bedside Disposition Plan:  Status is: Inpatient  Remains inpatient appropriate because:Inpatient level of care appropriate due to severity of illness   Dispo: The patient is from: Home              Anticipated d/c is to: SNF              Patient currently is not medically stable to d/c.  Continue to treat hyponatremia   Difficult to place patient No      Consultants:   Palliative care medicine  Oncology  Nephrology     Antimicrobials:  Anti-infectives (From admission, onward)   None       Objective: Vitals:   10/15/20 1929 10/15/20 2346 10/16/20 0513 10/16/20 0850  BP: 137/70 125/66 137/66 133/72  Pulse: 83 80 74 77  Resp: 17 17 17 16   Temp: 98.3 F (36.8 C) 98.3 F (36.8 C) 98.4 F (36.9 C) 98.2 F (36.8 C)  TempSrc: Oral   Oral  SpO2: 98% 97% 99% 97%  Weight:      Height:        Intake/Output Summary (Last 24 hours) at 10/16/2020 1050 Last data filed at 10/16/2020 1025 Gross per 24 hour  Intake 180 ml  Output 1426 ml  Net -1246 ml   Filed Weights   10/10/20 1058 10/10/20 1715  Weight:  63.5 kg 72.7 kg    Examination: General exam: Appears calm and comfortable, frail Respiratory system: Clear to auscultation. Respiratory effort normal. Cardiovascular system: S1 & S2 heard, RRR. +murmur and click, No pedal edema. Gastrointestinal system: Abdomen is nondistended, soft and nontender. Normal bowel sounds heard. Central nervous system: Alert. Non focal exam.  Extremities: Symmetric in appearance bilaterally  Skin: No rashes, lesions or ulcers on exposed skin  Psychiatry:  Mood & affect appropriate.   Data  Reviewed: I have personally reviewed following labs and imaging studies  CBC: Recent Labs  Lab 10/10/20 1226 10/11/20 0756 10/12/20 0500 10/13/20 0613 10/14/20 0754 10/15/20 0144 10/16/20 0430  WBC 7.4   < > 7.0 2.2* 1.3* 3.3* 7.1  NEUTROABS 7.0  --   --   --   --   --   --   HGB 8.9*   < > 8.0* 8.1* 8.1* 8.4* 8.3*  HCT 25.9*   < > 23.7* 23.3* 23.2* 24.5* 23.5*  MCV 89.6   < > 90.5 87.6 87.5 88.8 87.4  PLT 146*   < > 116* 86* 60* 53* 37*   < > = values in this interval not displayed.   Basic Metabolic Panel: Recent Labs  Lab 10/13/20 0613 10/14/20 0754 10/14/20 1047 10/14/20 1812 10/15/20 0144 10/15/20 0803 10/16/20 0430  NA 126* 124* 122* 123* 125* 126* 126*  K 4.4 4.2 4.1  --   --  4.4 4.4  CL 100 96* 93*  --   --  96* 95*  CO2 18* 18* 19*  --   --  19* 21*  GLUCOSE 108* 99 127*  --   --  101* 120*  BUN 47* 50* 51*  --   --  56* 57*  CREATININE 1.64* 1.71* 1.64*  --   --  1.79* 1.71*  CALCIUM 8.4* 8.7* 8.7*  --   --  9.1 8.7*  MG  --  1.9  --   --   --   --   --    GFR: Estimated Creatinine Clearance: 22.1 mL/min (A) (by C-G formula based on SCr of 1.71 mg/dL (H)). Liver Function Tests: Recent Labs  Lab 10/10/20 1226 10/11/20 0756 10/12/20 0500 10/14/20 1047  AST 45* 38 86* 38  ALT 42 31 39 27  ALKPHOS 202* 173* 354* 246*  BILITOT 1.9* 2.1* 5.4* 2.3*  PROT 5.5* 5.2* 5.0* 5.6*  ALBUMIN 2.5* 2.3* 2.1* 2.7*   Recent Labs  Lab 10/10/20 1226  LIPASE 24   No results for input(s): AMMONIA in the last 168 hours. Coagulation Profile: No results for input(s): INR, PROTIME in the last 168 hours. Cardiac Enzymes: No results for input(s): CKTOTAL, CKMB, CKMBINDEX, TROPONINI in the last 168 hours. BNP (last 3 results) No results for input(s): PROBNP in the last 8760 hours. HbA1C: No results for input(s): HGBA1C in the last 72 hours. CBG: No results for input(s): GLUCAP in the last 168 hours. Lipid Profile: No results for input(s): CHOL, HDL, LDLCALC, TRIG,  CHOLHDL, LDLDIRECT in the last 72 hours. Thyroid Function Tests: No results for input(s): TSH, T4TOTAL, FREET4, T3FREE, THYROIDAB in the last 72 hours. Anemia Panel: No results for input(s): VITAMINB12, FOLATE, FERRITIN, TIBC, IRON, RETICCTPCT in the last 72 hours. Sepsis Labs: No results for input(s): PROCALCITON, LATICACIDVEN in the last 168 hours.  Recent Results (from the past 240 hour(s))  Resp Panel by RT-PCR (Flu A&B, Covid) Nasopharyngeal Swab     Status: None   Collection Time: 10/10/20  2:07 PM   Specimen: Nasopharyngeal Swab; Nasopharyngeal(NP) swabs in vial transport medium  Result Value Ref Range Status   SARS Coronavirus 2 by RT PCR NEGATIVE NEGATIVE Final    Comment: (NOTE) SARS-CoV-2 target nucleic acids are NOT DETECTED.  The SARS-CoV-2 RNA is generally detectable in upper respiratory specimens during the acute phase of infection. The lowest concentration of SARS-CoV-2 viral copies this assay can detect is 138 copies/mL. A negative result does not preclude SARS-Cov-2 infection and should not be used as the sole basis for treatment or other patient management decisions. A negative result may occur with  improper specimen collection/handling, submission of specimen other than nasopharyngeal swab, presence of viral mutation(s) within the areas targeted by this assay, and inadequate number of viral copies(<138 copies/mL). A negative result must be combined with clinical observations, patient history, and epidemiological information. The expected result is Negative.  Fact Sheet for Patients:  EntrepreneurPulse.com.au  Fact Sheet for Healthcare Providers:  IncredibleEmployment.be  This test is no t yet approved or cleared by the [place] FDA and  has been authorized for detection and/or diagnosis of SARS-CoV-2 by FDA under an Emergency Use Authorization (EUA). This EUA will remain  in effect (meaning this test can be used) for  the duration of the COVID-19 declaration under Section 564(b)(1) of the Act, 21 U.S.C.section 360bbb-3(b)(1), unless the authorization is terminated  or revoked sooner.       Influenza A by PCR NEGATIVE NEGATIVE Final   Influenza B by PCR NEGATIVE NEGATIVE Final    Comment: (NOTE) The Xpert Xpress SARS-CoV-2/FLU/RSV plus assay is intended as an aid in the diagnosis of influenza from Nasopharyngeal swab specimens and should not be used as a sole basis for treatment. Nasal washings and aspirates are unacceptable for Xpert Xpress SARS-CoV-2/FLU/RSV testing.  Fact Sheet for Patients: EntrepreneurPulse.com.au  Fact Sheet for Healthcare Providers: IncredibleEmployment.be  This test is not yet approved or cleared by the [place] FDA and has been authorized for detection and/or diagnosis of SARS-CoV-2 by FDA under an Emergency Use Authorization (EUA). This EUA will remain in effect (meaning this test can be used) for the duration of the COVID-19 declaration under Section 564(b)(1) of the Act, 21 U.S.C. section 360bbb-3(b)(1), unless the authorization is terminated or revoked.  Performed at [facility], [place], [place] [address]   SARS CORONAVIRUS 2 (TAT 6-24 HRS) Nasopharyngeal     Status: None   Collection Time: 10/14/20  2:10 PM   Specimen: Nasopharyngeal  Result Value Ref Range Status   SARS Coronavirus 2 NEGATIVE NEGATIVE Final    Comment: (NOTE) SARS-CoV-2 target nucleic acids are NOT DETECTED.  The SARS-CoV-2 RNA is generally detectable in upper and lower respiratory specimens during the acute phase of infection. Negative results do not preclude SARS-CoV-2 infection, do not rule out co-infections with other pathogens, and should not be used as the sole basis for treatment or other patient management decisions. Negative results must be combined with clinical observations, patient history, and  epidemiological information. The expected result is Negative.  Fact Sheet for Patients: SugarRoll.be  Fact Sheet for Healthcare Providers: https://www.woods-mathews.com/  This test is not yet approved or cleared by the [place] FDA and  has been authorized for detection and/or diagnosis of SARS-CoV-2 by FDA under an Emergency Use Authorization (EUA). This EUA will remain  in effect (meaning this test can be used) for the duration of the COVID-19 declaration under Se ction 564(b)(1) of the Act, 21 U.S.C. section  360bbb-3(b)(1), unless the authorization is terminated or revoked sooner.  Performed at [facility], [place] [address], [place], [place] [address]       Radiology Studies: No results found.    Scheduled Meds: . ascorbic acid  1,000 mg Oral Once per day on Mon Wed Fri  . calcium-vitamin D  1 tablet Oral Daily  . Chlorhexidine Gluconate Cloth  6 each Topical Daily  . feeding supplement  1 Container Oral TID BM  . midodrine  5 mg Oral TID WC  . multivitamin with minerals  1 tablet Oral Daily  . nystatin  5 mL Oral QID  . spironolactone  50 mg Oral Daily   Continuous Infusions: . methocarbamol (ROBAXIN) IV       LOS: 6 days      Time spent: 20 minutes   Dessa Phi, DO [facility] 10/16/2020, 10:50 AM   Available via Epic secure chat 7am-7pm After these hours, please refer to coverage provider listed on [email or web address]

## 2020-10-16 NOTE — TOC Progression Note (Signed)
Transition of Care [facility]) - Progression Note    Patient Details  Name: Veronica Colon MRN: [id number] Date of Birth: 1931/11/30  Transition of Care [facility]) CM/SW [place], RN Phone Number: 10/16/2020, 3:56 PM  Clinical Narrative:  TOC in to see patient at bedside.  Patient sleeping, daughter at bedside, discussed patient's impending transfer to [facility] upon medical clearance by MD. Daughter was tearful, stating that it is difficult to see her mother this way.  Daughter talked for a bit and was amenable to chaplain consult, chaplain paged.  TOC contact information given, TOC follow through discharge.    Expected Discharge Plan: Skilled Nursing Facility Barriers to Discharge: Other (comment) (Pt continue ongoing chemotherapy)  Expected Discharge Plan and Services Expected Discharge Plan: [place]   Discharge Planning Services: CM Consult   Living arrangements for the past 2 months: Single Family Home (Lives with her daughter)                                       Social Determinants of Health (SDOH) Interventions    Readmission Risk Interventions Readmission Risk Prevention Plan 10/14/2020  Transportation Screening Complete  PCP or Specialist Appt within 3-5 Days Complete  HRI or [place] Complete  Social Work Consult for [place] Planning/Counseling Complete  Palliative Care Screening Not Applicable  Medication Review Press photographer) Complete  Some recent data might be hidden

## 2020-10-16 NOTE — Progress Notes (Signed)
   10/16/20 1620  Clinical Encounter Type  Visited With Patient and family together  Visit Type Initial;Spiritual support;Social support  Referral From Nurse  Consult/Referral To [facility] responded to a pager from nurse regarding the pt possibly needing a chaplain visit. Chaplain offered spiritual and emotional support. Chaplain also prayed with pt and her daughter. Her daughter was able to express her emotions. Daughter was very appreciate for the visit. Chaplain will have other chaplains follow up also.

## 2020-10-16 NOTE — Care Management Important Message (Signed)
Important Message  Patient Details  Name: Veronica Colon MRN: [id number] Date of Birth: 13-Jun-1932   Medicare Important Message Given:  Yes     Juliann Pulse A Enoch Moffa 10/16/2020, 10:38 AM

## 2020-10-16 NOTE — Progress Notes (Signed)
[facility]  ROUNDING NOTE   Subjective:   Veronica Colon is a 87 year old African-American female with a past medical history of metastatic pancreatic cancer currently undergoing palliative chemotherapy, history of colon and breast cancer ( in remission), type 2 diabetes, CKD, and MGUS.  She presents to the ED with generalized weakness.  She is a known patient to the [facility].  Patient currently lives with daughter, who was at bedside.  Daughter states she feels her mother's health has declined since beginning chemotherapy.  This includes loss of appetite and weakness.  We have been consulted due to to patient's hyponatremia.  Patient seen resting in bed daughter at bedside Alert and oriented Continues to have poor appetite Denies nausea and shortness of breath  UOP 1.4L  Objective:  Vital signs in last 24 hours:  Temp:  [98.2 F (36.8 C)-98.8 F (37.1 C)] 98.2 F (36.8 C) (04/07 0850) Pulse Rate:  [52-83] 77 (04/07 0850) Resp:  [16-17] 16 (04/07 0850) BP: (125-141)/(60-72) 133/72 (04/07 0850) SpO2:  [97 %-99 %] 97 % (04/07 0850)  Weight change:  Filed Weights   10/10/20 1058 10/10/20 1715  Weight: 63.5 kg 72.7 kg    Intake/Output: I/O last 3 completed shifts: In: 55 [P.O.:60] Out: 2426 [Urine:2425; Stool:1]   Intake/Output this shift:  No intake/output data recorded.  Physical Exam: General: NAD, resting quietly in bed  Head: Normocephalic, atraumatic. Moist oral mucosal, hard of hearing   Lungs:  Clear to auscultation  Heart: Regular rate and rhythm  Abdomen:  Soft, nontender, non distended  Extremities: 1+ peripheral edema.LLE  Neurologic: Alert, able to answer simple questions  Skin: No lesions       Basic Metabolic Panel: Recent Labs  Lab 10/13/20 0613 10/14/20 0754 10/14/20 1047 10/14/20 1812 10/15/20 0144 10/15/20 0803 10/16/20 0430  NA 126* 124* 122* 123* 125* 126* 126*  K 4.4 4.2 4.1  --   --  4.4 4.4  CL 100 96* 93*  --   --  96* 95*   CO2 18* 18* 19*  --   --  19* 21*  GLUCOSE 108* 99 127*  --   --  101* 120*  BUN 47* 50* 51*  --   --  56* 57*  CREATININE 1.64* 1.71* 1.64*  --   --  1.79* 1.71*  CALCIUM 8.4* 8.7* 8.7*  --   --  9.1 8.7*  MG  --  1.9  --   --   --   --   --     Liver Function Tests: Recent Labs  Lab 10/10/20 1226 10/11/20 0756 10/12/20 0500 10/14/20 1047  AST 45* 38 86* 38  ALT 42 31 39 27  ALKPHOS 202* 173* 354* 246*  BILITOT 1.9* 2.1* 5.4* 2.3*  PROT 5.5* 5.2* 5.0* 5.6*  ALBUMIN 2.5* 2.3* 2.1* 2.7*   Recent Labs  Lab 10/10/20 1226  LIPASE 24   No results for input(s): AMMONIA in the last 168 hours.  CBC: Recent Labs  Lab 10/10/20 1226 10/11/20 0756 10/12/20 0500 10/13/20 0613 10/14/20 0754 10/15/20 0144 10/16/20 0430  WBC 7.4   < > 7.0 2.2* 1.3* 3.3* 7.1  NEUTROABS 7.0  --   --   --   --   --   --   HGB 8.9*   < > 8.0* 8.1* 8.1* 8.4* 8.3*  HCT 25.9*   < > 23.7* 23.3* 23.2* 24.5* 23.5*  MCV 89.6   < > 90.5 87.6 87.5 88.8 87.4  PLT  146*   < > 116* 86* 60* 53* 37*   < > = values in this interval not displayed.    Cardiac Enzymes: No results for input(s): CKTOTAL, CKMB, CKMBINDEX, TROPONINI in the last 168 hours.  BNP: Invalid input(s): POCBNP  CBG: No results for input(s): GLUCAP in the last 168 hours.  Microbiology: Results for orders placed or performed during the hospital encounter of 10/10/20  Resp Panel by RT-PCR (Flu A&B, Covid) Nasopharyngeal Swab     Status: None   Collection Time: 10/10/20  2:07 PM   Specimen: Nasopharyngeal Swab; Nasopharyngeal(NP) swabs in vial transport medium  Result Value Ref Range Status   SARS Coronavirus 2 by RT PCR NEGATIVE NEGATIVE Final    Comment: (NOTE) SARS-CoV-2 target nucleic acids are NOT DETECTED.  The SARS-CoV-2 RNA is generally detectable in upper respiratory specimens during the acute phase of infection. The lowest concentration of SARS-CoV-2 viral copies this assay can detect is 138 copies/mL. A negative result  does not preclude SARS-Cov-2 infection and should not be used as the sole basis for treatment or other patient management decisions. A negative result may occur with  improper specimen collection/handling, submission of specimen other than nasopharyngeal swab, presence of viral mutation(s) within the areas targeted by this assay, and inadequate number of viral copies(<138 copies/mL). A negative result must be combined with clinical observations, patient history, and epidemiological information. The expected result is Negative.  Fact Sheet for Patients:  EntrepreneurPulse.com.au  Fact Sheet for Healthcare Providers:  IncredibleEmployment.be  This test is no t yet approved or cleared by the [place] FDA and  has been authorized for detection and/or diagnosis of SARS-CoV-2 by FDA under an Emergency Use Authorization (EUA). This EUA will remain  in effect (meaning this test can be used) for the duration of the COVID-19 declaration under Section 564(b)(1) of the Act, 21 U.S.C.section 360bbb-3(b)(1), unless the authorization is terminated  or revoked sooner.       Influenza A by PCR NEGATIVE NEGATIVE Final   Influenza B by PCR NEGATIVE NEGATIVE Final    Comment: (NOTE) The Xpert Xpress SARS-CoV-2/FLU/RSV plus assay is intended as an aid in the diagnosis of influenza from Nasopharyngeal swab specimens and should not be used as a sole basis for treatment. Nasal washings and aspirates are unacceptable for Xpert Xpress SARS-CoV-2/FLU/RSV testing.  Fact Sheet for Patients: EntrepreneurPulse.com.au  Fact Sheet for Healthcare Providers: IncredibleEmployment.be  This test is not yet approved or cleared by the [place] FDA and has been authorized for detection and/or diagnosis of SARS-CoV-2 by FDA under an Emergency Use Authorization (EUA). This EUA will remain in effect (meaning this test can be used) for  the duration of the COVID-19 declaration under Section 564(b)(1) of the Act, 21 U.S.C. section 360bbb-3(b)(1), unless the authorization is terminated or revoked.  Performed at [facility], [place], [place] [address]   SARS CORONAVIRUS 2 (TAT 6-24 HRS) Nasopharyngeal     Status: None   Collection Time: 10/14/20  2:10 PM   Specimen: Nasopharyngeal  Result Value Ref Range Status   SARS Coronavirus 2 NEGATIVE NEGATIVE Final    Comment: (NOTE) SARS-CoV-2 target nucleic acids are NOT DETECTED.  The SARS-CoV-2 RNA is generally detectable in upper and lower respiratory specimens during the acute phase of infection. Negative results do not preclude SARS-CoV-2 infection, do not rule out co-infections with other pathogens, and should not be used as the sole basis for treatment or other patient management decisions. Negative results  must be combined with clinical observations, patient history, and epidemiological information. The expected result is Negative.  Fact Sheet for Patients: SugarRoll.be  Fact Sheet for Healthcare Providers: https://www.woods-mathews.com/  This test is not yet approved or cleared by the [place] FDA and  has been authorized for detection and/or diagnosis of SARS-CoV-2 by FDA under an Emergency Use Authorization (EUA). This EUA will remain  in effect (meaning this test can be used) for the duration of the COVID-19 declaration under Se ction 564(b)(1) of the Act, 21 U.S.C. section 360bbb-3(b)(1), unless the authorization is terminated or revoked sooner.  Performed at [facility], [place] [address] [place], [place] [address]     Coagulation Studies: No results for input(s): LABPROT, INR in the last 72 hours.  Urinalysis: No results for input(s): COLORURINE, LABSPEC, PHURINE, GLUCOSEU, HGBUR, BILIRUBINUR, KETONESUR, PROTEINUR, UROBILINOGEN, NITRITE, LEUKOCYTESUR in the last 72  hours.  Invalid input(s): APPERANCEUR    Imaging: No results found.   Medications:   . methocarbamol (ROBAXIN) IV     . ascorbic acid  1,000 mg Oral Once per day on Mon Wed Fri  . calcium-vitamin D  1 tablet Oral Daily  . Chlorhexidine Gluconate Cloth  6 each Topical Daily  . feeding supplement  1 Container Oral TID BM  . midodrine  5 mg Oral TID WC  . multivitamin with minerals  1 tablet Oral Daily  . nystatin  5 mL Oral QID  . spironolactone  50 mg Oral Daily   acetaminophen **OR** acetaminophen, methocarbamol (ROBAXIN) IV, ondansetron **OR** ondansetron (ZOFRAN) IV, oxyCODONE, polyethylene glycol, prochlorperazine, traZODone  Assessment/ Plan:  Ms. Beverely Suen Kustra is a 87 y.o.  female with a past medical history of metastatic pancreatic cancer currently undergoing palliative chemotherapy, history of colon and breast cancer ( in remission), type 2 diabetes, CKD, and MGUS.  1. Hyponatremia likely due to excessive free water intake, NA-128    History received from daughter refers to decreasing appetite over several weeks.  Received lasix and Albumin to mobilize interstitial fluid Steady improvement Current level 126 Tolvaptan  Encourage nutrition and limit clear fluids Avoid hypotension.  Continue midodrine Discussed care with family and they are agreeable to continue current measures and avoid escalation of care.  2. Acute Kidney Injury on chronic kidney disease stage 3b with baseline creatinine 1.51 and GFR of 36 on 03/21/20 .  Acute kidney injury secondary to chemo induced loss of appetite Encourage oral nutrition Retention reported by nursing yesterday evening, I&O cath 538ml. Will continue to monitor  Lab Results  Component Value Date   CREATININE 1.71 (H) 10/16/2020   CREATININE 1.79 (H) 10/15/2020   CREATININE 1.64 (H) 10/14/2020    Intake/Output Summary (Last 24 hours) at 10/16/2020 0950 Last data filed at 10/15/2020 1840 Gross per 24 hour  Intake 60 ml  Output  1426 ml  Net -1366 ml   3. Diabetes mellitis with CKD - stable  Lab Results  Component Value Date   HGBA1C 5.9 (H) 10/11/2020       LOS: 6 Leovardo Thoman 4/7/20229:50 AM   Patient was seen and evaluated with [name], NP.  Plan of care was discussed with patient as well as NP.  I agree with the note as documented with edits.

## 2020-10-16 NOTE — Plan of Care (Signed)
  Problem: Clinical Measurements: Goal: Will remain free from infection Outcome: Progressing   Problem: Pain Managment: Goal: General experience of comfort will improve Outcome: Progressing   Problem: Safety: Goal: Ability to remain free from injury will improve Outcome: Progressing   Problem: Skin Integrity: Goal: Risk for impaired skin integrity will decrease Outcome: Progressing   

## 2020-10-17 DIAGNOSIS — E440 Moderate protein-calorie malnutrition: Secondary | ICD-10-CM | POA: Insufficient documentation

## 2020-10-17 DIAGNOSIS — E871 Hypo-osmolality and hyponatremia: Secondary | ICD-10-CM | POA: Diagnosis not present

## 2020-10-17 LAB — CBC
HCT: 23.5 % — ABNORMAL LOW (ref 36.0–46.0)
Hemoglobin: 8 g/dL — ABNORMAL LOW (ref 12.0–15.0)
MCH: 30.2 pg (ref 26.0–34.0)
MCHC: 34 g/dL (ref 30.0–36.0)
MCV: 88.7 fL (ref 80.0–100.0)
Platelets: 45 K/uL — ABNORMAL LOW (ref 150–400)
RBC: 2.65 MIL/uL — ABNORMAL LOW (ref 3.87–5.11)
RDW: 18 % — ABNORMAL HIGH (ref 11.5–15.5)
WBC: 8 K/uL (ref 4.0–10.5)
nRBC: 8.7 % — ABNORMAL HIGH (ref 0.0–0.2)

## 2020-10-17 LAB — BASIC METABOLIC PANEL WITH GFR
Anion gap: 9 (ref 5–15)
BUN: 56 mg/dL — ABNORMAL HIGH (ref 8–23)
CO2: 21 mmol/L — ABNORMAL LOW (ref 22–32)
Calcium: 8.7 mg/dL — ABNORMAL LOW (ref 8.9–10.3)
Chloride: 97 mmol/L — ABNORMAL LOW (ref 98–111)
Creatinine, Ser: 1.73 mg/dL — ABNORMAL HIGH (ref 0.44–1.00)
GFR, Estimated: 28 mL/min — ABNORMAL LOW
Glucose, Bld: 114 mg/dL — ABNORMAL HIGH (ref 70–99)
Potassium: 4.4 mmol/L (ref 3.5–5.1)
Sodium: 127 mmol/L — ABNORMAL LOW (ref 135–145)

## 2020-10-17 MED ORDER — PROSOURCE PLUS PO LIQD
30.0000 mL | Freq: Two times a day (BID) | ORAL | Status: DC
  Administered 2020-10-20 – 2020-10-22 (×5): 30 mL via ORAL
  Filled 2020-10-17 (×7): qty 30

## 2020-10-17 NOTE — Progress Notes (Addendum)
[facility]  ROUNDING NOTE   Subjective:   Veronica Colon is a 87 year old African-American female with a past medical history of metastatic pancreatic cancer currently undergoing palliative chemotherapy, history of colon and breast cancer ( in remission), type 2 diabetes, CKD, and MGUS.  She presents to the ED with generalized weakness.  She is a known patient to the [facility].  Patient currently lives with daughter, who was at bedside.  Daughter states she feels her mother's health has declined since beginning chemotherapy.  This includes loss of appetite and weakness.  We have been consulted due to to patient's hyponatremia.  Patient seen resting in bed Daughter at bedside Alert and oriented Continues to have poor appetite Denies nausea and shortness of breath  Got out of bed with PT to  [facility]   UOP 832mL  Objective:  Vital signs in last 24 hours:  Temp:  [97.6 F (36.4 C)-99.1 F (37.3 C)] 97.8 F (36.6 C) (04/08 0730) Pulse Rate:  [72-82] 75 (04/08 0730) Resp:  [15-18] 18 (04/08 0730) BP: (98-136)/(57-83) 121/57 (04/08 0730) SpO2:  [95 %-100 %] 95 % (04/08 0730)  Weight change:  Filed Weights   10/10/20 1058 10/10/20 1715  Weight: 63.5 kg 72.7 kg    Intake/Output: I/O last 3 completed shifts: In: 120 [P.O.:120] Out: 875 [Urine:875]   Intake/Output this shift:  No intake/output data recorded.  Physical Exam: General: NAD, resting in bed  Head: Normocephalic, atraumatic. Moist oral mucosal, hard of hearing   Lungs:  Clear to auscultation  Heart: Regular rate and rhythm  Abdomen:  Soft, nontender,    Extremities: 1+ peripheral edema.LLE  Neurologic: Alert, able to answer simple questions  Skin: No lesions       Basic Metabolic Panel: Recent Labs  Lab 10/14/20 0754 10/14/20 1047 10/14/20 1812 10/15/20 0144 10/15/20 0803 10/16/20 0430 10/17/20 0548  NA 124* 122* 123* 125* 126* 126* 127*  K 4.2 4.1  --   --  4.4 4.4 4.4  CL 96* 93*  --   --  96*  95* 97*  CO2 18* 19*  --   --  19* 21* 21*  GLUCOSE 99 127*  --   --  101* 120* 114*  BUN 50* 51*  --   --  56* 57* 56*  CREATININE 1.71* 1.64*  --   --  1.79* 1.71* 1.73*  CALCIUM 8.7* 8.7*  --   --  9.1 8.7* 8.7*  MG 1.9  --   --   --   --   --   --     Liver Function Tests: Recent Labs  Lab 10/10/20 1226 10/11/20 0756 10/12/20 0500 10/14/20 1047  AST 45* 38 86* 38  ALT 42 31 39 27  ALKPHOS 202* 173* 354* 246*  BILITOT 1.9* 2.1* 5.4* 2.3*  PROT 5.5* 5.2* 5.0* 5.6*  ALBUMIN 2.5* 2.3* 2.1* 2.7*   Recent Labs  Lab 10/10/20 1226  LIPASE 24   No results for input(s): AMMONIA in the last 168 hours.  CBC: Recent Labs  Lab 10/10/20 1226 10/11/20 0756 10/13/20 0613 10/14/20 0754 10/15/20 0144 10/16/20 0430 10/17/20 0548  WBC 7.4   < > 2.2* 1.3* 3.3* 7.1 8.0  NEUTROABS 7.0  --   --   --   --   --   --   HGB 8.9*   < > 8.1* 8.1* 8.4* 8.3* 8.0*  HCT 25.9*   < > 23.3* 23.2* 24.5* 23.5* 23.5*  MCV 89.6   < >  87.6 87.5 88.8 87.4 88.7  PLT 146*   < > 86* 60* 53* 37* 45*   < > = values in this interval not displayed.    Cardiac Enzymes: No results for input(s): CKTOTAL, CKMB, CKMBINDEX, TROPONINI in the last 168 hours.  BNP: Invalid input(s): POCBNP  CBG: No results for input(s): GLUCAP in the last 168 hours.  Microbiology: Results for orders placed or performed during the hospital encounter of 10/10/20  Resp Panel by RT-PCR (Flu A&B, Covid) Nasopharyngeal Swab     Status: None   Collection Time: 10/10/20  2:07 PM   Specimen: Nasopharyngeal Swab; Nasopharyngeal(NP) swabs in vial transport medium  Result Value Ref Range Status   SARS Coronavirus 2 by RT PCR NEGATIVE NEGATIVE Final    Comment: (NOTE) SARS-CoV-2 target nucleic acids are NOT DETECTED.  The SARS-CoV-2 RNA is generally detectable in upper respiratory specimens during the acute phase of infection. The lowest concentration of SARS-CoV-2 viral copies this assay can detect is 138 copies/mL. A negative  result does not preclude SARS-Cov-2 infection and should not be used as the sole basis for treatment or other patient management decisions. A negative result may occur with  improper specimen collection/handling, submission of specimen other than nasopharyngeal swab, presence of viral mutation(s) within the areas targeted by this assay, and inadequate number of viral copies(<138 copies/mL). A negative result must be combined with clinical observations, patient history, and epidemiological information. The expected result is Negative.  Fact Sheet for Patients:  EntrepreneurPulse.com.au  Fact Sheet for Healthcare Providers:  IncredibleEmployment.be  This test is no t yet approved or cleared by the [place] FDA and  has been authorized for detection and/or diagnosis of SARS-CoV-2 by FDA under an Emergency Use Authorization (EUA). This EUA will remain  in effect (meaning this test can be used) for the duration of the COVID-19 declaration under Section 564(b)(1) of the Act, 21 U.S.C.section 360bbb-3(b)(1), unless the authorization is terminated  or revoked sooner.       Influenza A by PCR NEGATIVE NEGATIVE Final   Influenza B by PCR NEGATIVE NEGATIVE Final    Comment: (NOTE) The Xpert Xpress SARS-CoV-2/FLU/RSV plus assay is intended as an aid in the diagnosis of influenza from Nasopharyngeal swab specimens and should not be used as a sole basis for treatment. Nasal washings and aspirates are unacceptable for Xpert Xpress SARS-CoV-2/FLU/RSV testing.  Fact Sheet for Patients: EntrepreneurPulse.com.au  Fact Sheet for Healthcare Providers: IncredibleEmployment.be  This test is not yet approved or cleared by the [place] FDA and has been authorized for detection and/or diagnosis of SARS-CoV-2 by FDA under an Emergency Use Authorization (EUA). This EUA will remain in effect (meaning this test can be used)  for the duration of the COVID-19 declaration under Section 564(b)(1) of the Act, 21 U.S.C. section 360bbb-3(b)(1), unless the authorization is terminated or revoked.  Performed at [facility], [place], [place] [address]   SARS CORONAVIRUS 2 (TAT 6-24 HRS) Nasopharyngeal     Status: None   Collection Time: 10/14/20  2:10 PM   Specimen: Nasopharyngeal  Result Value Ref Range Status   SARS Coronavirus 2 NEGATIVE NEGATIVE Final    Comment: (NOTE) SARS-CoV-2 target nucleic acids are NOT DETECTED.  The SARS-CoV-2 RNA is generally detectable in upper and lower respiratory specimens during the acute phase of infection. Negative results do not preclude SARS-CoV-2 infection, do not rule out co-infections with other pathogens, and should not be used as the sole basis for treatment  or other patient management decisions. Negative results must be combined with clinical observations, patient history, and epidemiological information. The expected result is Negative.  Fact Sheet for Patients: SugarRoll.be  Fact Sheet for Healthcare Providers: https://www.woods-mathews.com/  This test is not yet approved or cleared by the [place] FDA and  has been authorized for detection and/or diagnosis of SARS-CoV-2 by FDA under an Emergency Use Authorization (EUA). This EUA will remain  in effect (meaning this test can be used) for the duration of the COVID-19 declaration under Se ction 564(b)(1) of the Act, 21 U.S.C. section 360bbb-3(b)(1), unless the authorization is terminated or revoked sooner.  Performed at [facility], [place] [address] [place], [place] [address]     Coagulation Studies: No results for input(s): LABPROT, INR in the last 72 hours.  Urinalysis: No results for input(s): COLORURINE, LABSPEC, PHURINE, GLUCOSEU, HGBUR, BILIRUBINUR, KETONESUR, PROTEINUR, UROBILINOGEN, NITRITE, LEUKOCYTESUR in the last 72  hours.  Invalid input(s): APPERANCEUR    Imaging: No results found.   Medications:   . methocarbamol (ROBAXIN) IV     . ascorbic acid  1,000 mg Oral Once per day on Mon Wed Fri  . calcium-vitamin D  1 tablet Oral Daily  . Chlorhexidine Gluconate Cloth  6 each Topical Daily  . feeding supplement  1 Container Oral TID BM  . midodrine  5 mg Oral TID WC  . multivitamin with minerals  1 tablet Oral Daily  . nystatin  5 mL Oral QID  . sodium chloride flush  10-40 mL Intracatheter Q12H  . spironolactone  50 mg Oral Daily   acetaminophen **OR** acetaminophen, methocarbamol (ROBAXIN) IV, ondansetron **OR** ondansetron (ZOFRAN) IV, oxyCODONE, polyethylene glycol, prochlorperazine, sodium chloride flush, traZODone  Assessment/ Plan:  Veronica Colon is a 87 y.o.  female with a past medical history of metastatic pancreatic cancer currently undergoing palliative chemotherapy, history of colon and breast cancer ( in remission), type 2 diabetes, CKD, and MGUS.  1. Hyponatremia likely due to excessive free water intake, NA-128    History received from daughter refers to decreasing appetite over several weeks.  Received lasix and Albumin to mobilize interstitial fluid Steady improvement Current level 127 continue until Na > 130  Encourage nutrition and limit clear fluids Avoid hypotension.  Continue midodrine Continue current regimen  2. Acute Kidney Injury on chronic kidney disease stage 3b with baseline creatinine 1.51 and GFR of 36 on 03/21/20 .  Acute kidney injury secondary to chemo induced loss of appetite Encourage oral nutrition Will continue to monitor  Lab Results  Component Value Date   CREATININE 1.73 (H) 10/17/2020   CREATININE 1.71 (H) 10/16/2020   CREATININE 1.79 (H) 10/15/2020    Intake/Output Summary (Last 24 hours) at 10/17/2020 1007 Last data filed at 10/17/2020 0507 Gross per 24 hour  Intake 120 ml  Output 875 ml  Net -755 ml   3. Diabetes mellitis with  CKD - stable  Lab Results  Component Value Date   HGBA1C 5.9 (H) 10/11/2020       LOS: 7 Shantelle Breeze 4/8/202210:07 AM   Patient was seen and evaluated with [name], NP.  Plan of care was discussed with patient as well as NP.  I agree with the note as documented with edits.

## 2020-10-17 NOTE — Progress Notes (Signed)
Nutrition Follow-up  DOCUMENTATION CODES:   Non-severe (moderate) malnutrition in context of chronic illness  INTERVENTION:   -Continue 30 ml Prosource Plus BID, each supplement provides 100 kcals and 15 grams protein -D/c Boost Breeze po TID, each supplement provides 250 kcal and 9 grams of protein -Continue MVI with minerals daily -Continue liberalized diet of regular  NUTRITION DIAGNOSIS:   Moderate Malnutrition related to chronic illness (metasatic pancreatic cancer) as evidenced by mild fat depletion,moderate fat depletion,mild muscle depletion,moderate muscle depletion,energy intake < or equal to 75% for > or equal to 1 month.  Ongoing  GOAL:   Patient will meet greater than or equal to 90% of their needs  Progressing   MONITOR:   PO intake,Supplement acceptance,Diet advancement,Labs,Weight trends,Skin,I & O's  REASON FOR ASSESSMENT:   Consult Assessment of nutrition requirement/status  ASSESSMENT:   Veronica Colon is a 87 y.o. female with hx of metastatic pancreatic cancer currently undergoing palliative chemotherapy, history of breast and colon cancer in remission, type 2 diabetes, MGUS, CKD, who presents with profound weakness.  Reviewed I/O's: -755 ml x 24 hours and -4.5 L since admission  UOP: 875 ml x 24 hours  Spoke with pt and daughters at bedside. Pt has been struggling with poor oral intake over the past several months due to side effects from chemotherapy (taste changes). Daughter shares that pt is averse to many foods, especially those at extreme temperatures. She was consuming mostly cream soup and vegetables at home.   Pt continues with poor oral intake. Noted meal completion 25%. Pt consumed a few bites of scrambled eggs, hash browns, and bananas. Daughter is very supportive and tries to encourage pt to eat. She shares that pt does not like Ensure supplements, as they "run right through her". Boost Breeze also has the same effect. Pt has a fear of  eating due to potential of GI side effects.   Pt was just liberalized to a regular diet; daughter was happy about this, as pt was allowed to order a pizza, which she thinks she may eat. Also encouraged daughter to bring in outside food if desired.   Per TOC notes, pt awaiting SNF placement.   Medications reviewed and include vitamin C, calcium-vitamin D, and aldactone.   Labs reviewed: Na: 127.   NUTRITION - FOCUSED PHYSICAL EXAM:  Flowsheet Row Most Recent Value  Orbital Region Mild depletion  Upper Arm Region Mild depletion  Thoracic and Lumbar Region No depletion  Buccal Region No depletion  Temple Region Moderate depletion  Clavicle Bone Region Severe depletion  Clavicle and Acromion Bone Region Moderate depletion  Scapular Bone Region Moderate depletion  Dorsal Hand Moderate depletion  Patellar Region Mild depletion  Anterior Thigh Region Mild depletion  Posterior Calf Region Mild depletion  Edema (RD Assessment) None  Hair Reviewed  Eyes Reviewed  Mouth Reviewed  Skin Reviewed  Nails Reviewed       Diet Order:   Diet Order            Diet regular Room service appropriate? Yes; Fluid consistency: Thin  Diet effective now                 EDUCATION NEEDS:   Education needs have been addressed  Skin:  Skin Assessment: Reviewed RN Assessment  Last BM:  10/17/20  Height:   Ht Readings from Last 1 Encounters:  10/10/20 5\' 3"  (1.6 m)    Weight:   Wt Readings from Last 1 Encounters:  10/10/20 72.7 kg  Ideal Body Weight:  52.3 kg  BMI:  Body mass index is 28.39 kg/m.  Estimated Nutritional Needs:   Kcal:  1650-1850  Protein:  80-95 grams  Fluid:  > 1.6 L    Loistine Chance, RD, LDN, [place] Registered Dietitian II Certified Diabetes Care and Education Specialist Please refer to [facility] for RD and/or RD on-call/weekend/after hours pager

## 2020-10-17 NOTE — Progress Notes (Signed)
Notified Dr. Maylene Roes of morning bladder scan of 715. She stated to ask patient and daughter, for them to decide if they wanted in/out catheter or indwelling catheter. Spoke with daughter and patient and patient stated that she wanted to wait and see if it would happen own its own. PT worked with patient around 1600. She tolerated therapy. PT came out and told me she urinated, half in [place] and some incontinent. Pads and linens were changed. Bladder scanned patient and found it to be >530. Notified Dr. Maylene Roes about issue and also that abdomen is distended. She stated that the scanner might have picked up fluid from abdomen and not in bladder, however she stated to leave foley in for strict urine output and to ask family if they would consider a paracentesis. Went in and spoke with daughter and patient. Daughter stated that at this time her abdomen is not painful or causing any issues. If there were changes in status they would consider but for now they do not want to take any procedures unless necessary. She is resting in bed, low position with call bell in reach. Daughter at bedside.

## 2020-10-17 NOTE — Progress Notes (Signed)
Physical Therapy Treatment Patient Details Name: Veronica Colon MRN: [id number] DOB: 1931-08-07 Today's Date: 10/17/2020    History of Present Illness Pt is an 87 y.o. female with hx of metastatic pancreatic cancer currently undergoing palliative chemotherapy, history of breast and colon cancer in remission, type 2 diabetes, MGUS, CKD, who presents with profound weakness.  MD assessment includes: physical deconditioning, metastatic pancreatic cancer, AKI, elevated troponin, hyponatremia, and anemia.    PT Comments    Pt resting in bed upon PT arrival and agreeable to PT session; pt's daughter present end of session.  Max assist for bed mobility; mod assist to stand up to RW from elevated bed height; and min to mod assist to take some small steps in place and sidestep to L (with short steps) towards head of bed with RW use (see below for details).  Pt had more difficulty with L LE steps compared to R LE steps.  Pt participated fairly well with activity but fatigued with sessions activities.  Will continue to focus on strengthening and progressive functional mobility per pt tolerance.    Follow Up Recommendations  SNF     Equipment Recommendations  Rolling walker with 5" wheels;3in1 (PT)    Recommendations for Other Services       Precautions / Restrictions Precautions Precautions: Fall Precaution Comments: R chest port Restrictions Weight Bearing Restrictions: No    Mobility  Bed Mobility Overal bed mobility: Needs Assistance Bed Mobility: Rolling;Supine to Sit;Sit to Supine Rolling: Min assist (logrolling L and R in bed)   Supine to sit: Max assist Sit to supine: Max assist   General bed mobility comments: assist for trunk and B LE's; vc's for technique    Transfers Overall transfer level: Needs assistance Equipment used: Rolling walker (2 wheeled) Transfers: Sit to/from Stand Sit to Stand: Mod assist;From elevated surface         General transfer comment: bed height  mildly elevated; assist to initiate and come to full stand up to RW (using bed pad under pt to assist with initiating hip movement to stand and positioning hips forward once standing); vc's for UE/LE positioning and overall technique  Ambulation/Gait Ambulation/Gait assistance: Min assist;Mod assist Gait Distance (Feet):  (1st trial 4 small steps in place; 2nd trial 6 short sidesteps towards L along bed; 3rd trial 2 small sidesteps toward L along bed and 2 small steps in place) Assistive device: Rolling walker (2 wheeled)   Gait velocity: decreased   General Gait Details: increased difficulty advancing L LE compared to R LE; vc's for technique; assist for walker movement   Stairs             Wheelchair Mobility    Modified Rankin (Stroke Patients Only)       Balance Overall balance assessment: Needs assistance Sitting-balance support: No upper extremity supported;Feet supported Sitting balance-Leahy Scale: Good Sitting balance - Comments: steady sitting reaching within BOS   Standing balance support: Bilateral upper extremity supported;During functional activity Standing balance-Leahy Scale: Fair Standing balance comment: requires support for static standing                            Cognition Arousal/Alertness: Awake/alert Behavior During Therapy: Flat affect Overall Cognitive Status: Within Functional Limits for tasks assessed  Exercises      General Comments   Nursing cleared pt for participation in physical therapy.  Pt agreeable to PT session.      Pertinent Vitals/Pain Pain Assessment: No/denies pain Pain Intervention(s): Limited activity within patient's tolerance;Monitored during session;Repositioned  Vitals (HR and O2 on room air) stable and WFL throughout treatment session.    Home Living                      Prior Function            PT Goals (current goals can now be  found in the care plan section) Acute Rehab PT Goals Patient Stated Goal: to get stronger and do more for myself PT Goal Formulation: With patient Time For Goal Achievement: 10/23/20 Potential to Achieve Goals: Good Progress towards PT goals: Progressing toward goals    Frequency    Min 2X/week      PT Plan Current plan remains appropriate    Co-evaluation              AM-PAC PT "6 Clicks" Mobility   Outcome Measure  Help needed turning from your back to your side while in a flat bed without using bedrails?: A Lot Help needed moving from lying on your back to sitting on the side of a flat bed without using bedrails?: A Lot Help needed moving to and from a bed to a chair (including a wheelchair)?: A Lot Help needed standing up from a chair using your arms (e.g., wheelchair or bedside chair)?: A Lot Help needed to walk in hospital room?: Total Help needed climbing 3-5 steps with a railing? : Total 6 Click Score: 10    End of Session Equipment Utilized During Treatment: Gait belt Activity Tolerance: Patient limited by fatigue Patient left: in bed;with call bell/phone within reach;with bed alarm set;with family/visitor present;Other (comment);with nursing/sitter in room (nursing present for pt care) Nurse Communication: Mobility status;Precautions;Other (comment) (Pt urinated end of session (purewick not working so therapist and NT assisted pt with changing bed pad/sheet end of session; NT and nurse finishing pt care end of session)) PT Visit Diagnosis: Muscle weakness (generalized) (M62.81);Other abnormalities of gait and mobility (R26.89);Adult, failure to thrive (R62.7)     Time: 5093-2671 PT Time Calculation (min) (ACUTE ONLY): 26 min  Charges:  $Gait Training: 8-22 mins $Therapeutic Activity: 8-22 mins                    Leitha Bleak, PT 10/17/20, 5:00 PM

## 2020-10-17 NOTE — Progress Notes (Signed)
PROGRESS NOTE    Veronica Colon  SAY:[id number] DOB: 17-Feb-1932 DOA: 10/10/2020 PCP: Delsa Grana, PA-C     Brief Narrative:  Veronica Colon is an 87 year old female with history of metastatic pancreatic adenocarcinoma, history of breast and colon cancer, type 2 diabetes, MGUS, chronic kidney disease presents with weakness and inability to walk.  She was also found to have a low sodium.  Pancytopenia with counts lowering over the past couple days.  New events last 24 hours / Subjective: Patient without complaints today, appears to be weak and frail.   Assessment & Plan:   Active Problems:   H/O malignant neoplasm of breast   H/O malignant neoplasm of colon   CKD (chronic kidney disease), stage III (HCC)   Temporal arteritis (HCC)   Type 2 diabetes mellitus with obesity (HCC)   Monoclonal gammopathy of unknown significance (MGUS)   Primary pancreatic cancer with metastasis to other site [facility])   Dehydration   Generalized weakness   Metastatic adenocarcinoma to pancreas ([place])   Acute kidney injury superimposed on CKD ([place])   Hyponatremia   Anemia associated with chemotherapy   Thrombocytopenia (HCC)   Hydronephrosis, left   Impaired fasting glucose   Pancytopenia ([place])   Thrush   Palliative care encounter   Metastasis from pancreatic cancer ([place])   Hyponatremia -Patient was initially treated with IV fluid, then transition to IV albumin and Lasix.  However sodium continued to be low.  Started on tolvaptan, nephrology consulted -Improving, trend BMP   Hypotension -Continue midodrine   Metastatic pancreatic adenocarcinoma -Appreciate oncology, palliative care medicine -Oncology planning for chemo holiday, follow-up as outpatient 10/29/2020 -PET 09/2020 showed hypermetabolic left retroperitoneal and para-aortic lesions also pancreatic lesion and omental disease and ascites. Patient had declined paracentesis and treated with spironolactone  Pancytopenia secondary to  chemotherapy -S/p Granix -Continue to monitor  AKI on CKD stage IIIb -Baseline Cr 1.5 -Has been ranging 1.5-1.79, continue to monitor closely   Failure to thrive, generalized weakness -Planning for SNF on discharge    DVT prophylaxis: Lovenox on hold due to pancytopenia  Code Status: DNR Family Communication: No family at bedside Disposition Plan:  Status is: Inpatient  Remains inpatient appropriate because:Inpatient level of care appropriate due to severity of illness   Dispo: The patient is from: Home              Anticipated d/c is to: SNF              Patient currently is not medically stable to d/c.  Continue to treat hyponatremia   Difficult to place patient No      Consultants:   Palliative care medicine  Oncology  Nephrology     Antimicrobials:  Anti-infectives (From admission, onward)   None       Objective: Vitals:   10/16/20 1943 10/16/20 2339 10/17/20 0458 10/17/20 0730  BP: 98/83 132/63 128/60 (!) 121/57  Pulse: 82 74 76 75  Resp: 15 16 16 18   Temp: 98 F (36.7 C) 97.7 F (36.5 C) 98.3 F (36.8 C) 97.8 F (36.6 C)  TempSrc: Oral Oral Oral Oral  SpO2: 99% 97% 100% 95%  Weight:      Height:        Intake/Output Summary (Last 24 hours) at 10/17/2020 1008 Last data filed at 10/17/2020 0507 Gross per 24 hour  Intake 120 ml  Output 875 ml  Net -755 ml   Filed Weights   10/10/20 1058 10/10/20 1715  Weight: 63.5  kg 72.7 kg    Examination: General exam: Appears calm and comfortable, elderly and frail-appearing Respiratory system: Clear to auscultation. Respiratory effort normal. Cardiovascular system: S1 & S2 heard, RRR. No pedal edema. Gastrointestinal system: Abdomen is nondistended, soft and nontender. Normal bowel sounds heard. Central nervous system: Alert and oriented. Non focal exam. Speech clear  Extremities: Symmetric in appearance bilaterally  Skin: No rashes, lesions or ulcers on exposed skin  Psychiatry: Mood & affect  appropriate.    Data Reviewed: I have personally reviewed following labs and imaging studies  CBC: Recent Labs  Lab 10/10/20 1226 10/11/20 0756 10/13/20 0613 10/14/20 0754 10/15/20 0144 10/16/20 0430 10/17/20 0548  WBC 7.4   < > 2.2* 1.3* 3.3* 7.1 8.0  NEUTROABS 7.0  --   --   --   --   --   --   HGB 8.9*   < > 8.1* 8.1* 8.4* 8.3* 8.0*  HCT 25.9*   < > 23.3* 23.2* 24.5* 23.5* 23.5*  MCV 89.6   < > 87.6 87.5 88.8 87.4 88.7  PLT 146*   < > 86* 60* 53* 37* 45*   < > = values in this interval not displayed.   Basic Metabolic Panel: Recent Labs  Lab 10/14/20 0754 10/14/20 1047 10/14/20 1812 10/15/20 0144 10/15/20 0803 10/16/20 0430 10/17/20 0548  NA 124* 122* 123* 125* 126* 126* 127*  K 4.2 4.1  --   --  4.4 4.4 4.4  CL 96* 93*  --   --  96* 95* 97*  CO2 18* 19*  --   --  19* 21* 21*  GLUCOSE 99 127*  --   --  101* 120* 114*  BUN 50* 51*  --   --  56* 57* 56*  CREATININE 1.71* 1.64*  --   --  1.79* 1.71* 1.73*  CALCIUM 8.7* 8.7*  --   --  9.1 8.7* 8.7*  MG 1.9  --   --   --   --   --   --    GFR: Estimated Creatinine Clearance: 21.9 mL/min (A) (by C-G formula based on SCr of 1.73 mg/dL (H)). Liver Function Tests: Recent Labs  Lab 10/10/20 1226 10/11/20 0756 10/12/20 0500 10/14/20 1047  AST 45* 38 86* 38  ALT 42 31 39 27  ALKPHOS 202* 173* 354* 246*  BILITOT 1.9* 2.1* 5.4* 2.3*  PROT 5.5* 5.2* 5.0* 5.6*  ALBUMIN 2.5* 2.3* 2.1* 2.7*   Recent Labs  Lab 10/10/20 1226  LIPASE 24   No results for input(s): AMMONIA in the last 168 hours. Coagulation Profile: No results for input(s): INR, PROTIME in the last 168 hours. Cardiac Enzymes: No results for input(s): CKTOTAL, CKMB, CKMBINDEX, TROPONINI in the last 168 hours. BNP (last 3 results) No results for input(s): PROBNP in the last 8760 hours. HbA1C: No results for input(s): HGBA1C in the last 72 hours. CBG: No results for input(s): GLUCAP in the last 168 hours. Lipid Profile: No results for input(s):  CHOL, HDL, LDLCALC, TRIG, CHOLHDL, LDLDIRECT in the last 72 hours. Thyroid Function Tests: No results for input(s): TSH, T4TOTAL, FREET4, T3FREE, THYROIDAB in the last 72 hours. Anemia Panel: No results for input(s): VITAMINB12, FOLATE, FERRITIN, TIBC, IRON, RETICCTPCT in the last 72 hours. Sepsis Labs: No results for input(s): PROCALCITON, LATICACIDVEN in the last 168 hours.  Recent Results (from the past 240 hour(s))  Resp Panel by RT-PCR (Flu A&B, Covid) Nasopharyngeal Swab     Status: None   Collection Time:  10/10/20  2:07 PM   Specimen: Nasopharyngeal Swab; Nasopharyngeal(NP) swabs in vial transport medium  Result Value Ref Range Status   SARS Coronavirus 2 by RT PCR NEGATIVE NEGATIVE Final    Comment: (NOTE) SARS-CoV-2 target nucleic acids are NOT DETECTED.  The SARS-CoV-2 RNA is generally detectable in upper respiratory specimens during the acute phase of infection. The lowest concentration of SARS-CoV-2 viral copies this assay can detect is 138 copies/mL. A negative result does not preclude SARS-Cov-2 infection and should not be used as the sole basis for treatment or other patient management decisions. A negative result may occur with  improper specimen collection/handling, submission of specimen other than nasopharyngeal swab, presence of viral mutation(s) within the areas targeted by this assay, and inadequate number of viral copies(<138 copies/mL). A negative result must be combined with clinical observations, patient history, and epidemiological information. The expected result is Negative.  Fact Sheet for Patients:  EntrepreneurPulse.com.au  Fact Sheet for Healthcare Providers:  IncredibleEmployment.be  This test is no t yet approved or cleared by the [place] FDA and  has been authorized for detection and/or diagnosis of SARS-CoV-2 by FDA under an Emergency Use Authorization (EUA). This EUA will remain  in effect (meaning  this test can be used) for the duration of the COVID-19 declaration under Section 564(b)(1) of the Act, 21 U.S.C.section 360bbb-3(b)(1), unless the authorization is terminated  or revoked sooner.       Influenza A by PCR NEGATIVE NEGATIVE Final   Influenza B by PCR NEGATIVE NEGATIVE Final    Comment: (NOTE) The Xpert Xpress SARS-CoV-2/FLU/RSV plus assay is intended as an aid in the diagnosis of influenza from Nasopharyngeal swab specimens and should not be used as a sole basis for treatment. Nasal washings and aspirates are unacceptable for Xpert Xpress SARS-CoV-2/FLU/RSV testing.  Fact Sheet for Patients: EntrepreneurPulse.com.au  Fact Sheet for Healthcare Providers: IncredibleEmployment.be  This test is not yet approved or cleared by the [place] FDA and has been authorized for detection and/or diagnosis of SARS-CoV-2 by FDA under an Emergency Use Authorization (EUA). This EUA will remain in effect (meaning this test can be used) for the duration of the COVID-19 declaration under Section 564(b)(1) of the Act, 21 U.S.C. section 360bbb-3(b)(1), unless the authorization is terminated or revoked.  Performed at [facility], [place], [place] [address]   SARS CORONAVIRUS 2 (TAT 6-24 HRS) Nasopharyngeal     Status: None   Collection Time: 10/14/20  2:10 PM   Specimen: Nasopharyngeal  Result Value Ref Range Status   SARS Coronavirus 2 NEGATIVE NEGATIVE Final    Comment: (NOTE) SARS-CoV-2 target nucleic acids are NOT DETECTED.  The SARS-CoV-2 RNA is generally detectable in upper and lower respiratory specimens during the acute phase of infection. Negative results do not preclude SARS-CoV-2 infection, do not rule out co-infections with other pathogens, and should not be used as the sole basis for treatment or other patient management decisions. Negative results must be combined with clinical observations, patient  history, and epidemiological information. The expected result is Negative.  Fact Sheet for Patients: SugarRoll.be  Fact Sheet for Healthcare Providers: https://www.woods-mathews.com/  This test is not yet approved or cleared by the [place] FDA and  has been authorized for detection and/or diagnosis of SARS-CoV-2 by FDA under an Emergency Use Authorization (EUA). This EUA will remain  in effect (meaning this test can be used) for the duration of the COVID-19 declaration under Se ction 564(b)(1) of the Act, 21  U.S.C. section 360bbb-3(b)(1), unless the authorization is terminated or revoked sooner.  Performed at [facility], [place] [address] [place], [place] [address]       Radiology Studies: No results found.    Scheduled Meds: . ascorbic acid  1,000 mg Oral Once per day on Mon Wed Fri  . calcium-vitamin D  1 tablet Oral Daily  . Chlorhexidine Gluconate Cloth  6 each Topical Daily  . feeding supplement  1 Container Oral TID BM  . midodrine  5 mg Oral TID WC  . multivitamin with minerals  1 tablet Oral Daily  . nystatin  5 mL Oral QID  . sodium chloride flush  10-40 mL Intracatheter Q12H  . spironolactone  50 mg Oral Daily   Continuous Infusions: . methocarbamol (ROBAXIN) IV       LOS: 7 days      Time spent: 20 minutes   Dessa Phi, DO [facility] 10/17/2020, 10:08 AM   Available via Epic secure chat 7am-7pm After these hours, please refer to coverage provider listed on [email or web address]

## 2020-10-17 NOTE — TOC Progression Note (Signed)
Transition of Care [facility]) - Progression Note    Patient Details  Name: Veronica Colon MRN: [id number] Date of Birth: May 24, 1932  Transition of [facility]) CM/SW [place], RN Phone Number: 10/17/2020, 1:34 PM  Clinical Narrative:   Per progression rounds, patient will not be discharging today due to continued medical workup.  Spoke with daughter via phone, she is aware of this situation.  [facility] does not accept patients on the weekends per Tammy, admission coordinator, and the earliest transfer would be Monday.  Auth # [id number], from 10/16/2020 through 10/20/2020 via Bern[facility] Portal.  No further questions from daughter, TOC contact information given, TOC to follow through discharge.    Expected Discharge Plan: Skilled Nursing Facility Barriers to Discharge: Other (comment) (Pt continue ongoing chemotherapy)  Expected Discharge Plan and Services Expected Discharge Plan: [place]   Discharge Planning Services: CM Consult   Living arrangements for the past 2 months: Single Family Home (Lives with her daughter)                                       Social Determinants of Health (SDOH) Interventions    Readmission Risk Interventions Readmission Risk Prevention Plan 10/14/2020  Transportation Screening Complete  PCP or Specialist Appt within 3-5 Days Complete  HRI or [place] Complete  Social Work Consult for [place] Planning/Counseling Complete  Palliative Care Screening Not Applicable  Medication Review Press photographer) Complete  Some recent data might be hidden

## 2020-10-17 NOTE — Progress Notes (Signed)
Occupational Therapy Treatment Patient Details Name: Veronica Colon MRN: [id number] DOB: May 28, 1932 Today's Date: 10/17/2020    History of present illness Pt is an 87 y.o. female with hx of metastatic pancreatic cancer currently undergoing palliative chemotherapy, history of breast and colon cancer in remission, type 2 diabetes, MGUS, CKD, who presents with profound weakness.  MD assessment includes: physical deconditioning, metastatic pancreatic cancer, AKI, elevated troponin, hyponatremia, and anemia.   OT comments  Veronica Colon was seen for OT treatment on this date. Upon arrival to room pt reclined in bed with daughter at bedside, agreeable to tx. Pt required MAX A for BSC t/f and MAX A + RW for perihygiene in standing. Pt tolerates ~1 min standing for pericare. Pt reports fatigue, defers OOB to chair this morning but plan to attempt next session. DTR at bedside reports she is excited to see her mother moving. Pt making good progress toward goals. Pt continues to benefit from skilled OT services to maximize return to PLOF and minimize risk of future falls, injury, caregiver burden, and readmission. Will continue to follow POC. Discharge recommendation remains appropriate.    Follow Up Recommendations  SNF    Equipment Recommendations  3 in 1 bedside commode    Recommendations for Other Services      Precautions / Restrictions Precautions Precautions: Fall Precaution Comments: R chest port Restrictions Weight Bearing Restrictions: No       Mobility Bed Mobility Overal bed mobility: Needs Assistance Bed Mobility: Supine to Sit;Sit to Supine   Sidelying to sit: Max assist Supine to sit: Max assist          Transfers Overall transfer level: Needs assistance Equipment used: Rolling walker (2 wheeled) Transfers: Sit to/from [facility] Sit to Stand: Mod assist Stand pivot transfers: Max assist       General transfer comment: MOD A + RW sit<>stand, MAX A for  SPT bed<>BSC    Balance Overall balance assessment: Needs assistance Sitting-balance support: No upper extremity supported;Feet supported Sitting balance-Leahy Scale: Good Sitting balance - Comments: steady sitting reaching within BOS   Standing balance support: Bilateral upper extremity supported;During functional activity Standing balance-Leahy Scale: Fair Standing balance comment: requires support for static standing                           ADL either performed or assessed with clinical judgement   ADL Overall ADL's : Needs assistance/impaired                                       General ADL Comments: MAX A for BSC t/f and MAX A + RW for perihygiene in standing. Pt tolerates ~1 min standing for pericare.               Cognition Arousal/Alertness: Awake/alert Behavior During Therapy: Flat affect Overall Cognitive Status: Within Functional Limits for tasks assessed                                          Exercises Exercises: Other exercises Other Exercises Other Exercises: Pt and daughter educated re: d/c recs, HEP, falls prevention, importance of mobility for funcitonal strengthening Other Exercises: Toileting, sup<>sit, sit<>stand, sitting/standing balance/tolerance           Pertinent Vitals/ Pain  Pain Assessment: Faces Faces Pain Scale: Hurts a little bit Pain Location: abdominal pain Pain Descriptors / Indicators: Sore Pain Intervention(s): Limited activity within patient's tolerance;Repositioned         Frequency  Min 1X/week        Progress Toward Goals  OT Goals(current goals can now be found in the care plan section)  Progress towards OT goals: Progressing toward goals  Acute Rehab OT Goals Patient Stated Goal: to get stronger and do more for myself OT Goal Formulation: With patient Time For Goal Achievement: 10/25/20 Potential to Achieve Goals: Fair ADL Goals Pt Will Perform Lower  Body Dressing: with set-up;with min assist Pt Will Transfer to Toilet: with min guard assist Additional ADL Goal #1: Pt to increase bilateral UE strength by 1 mm grade to assist with completing ADL tasks  Plan Discharge plan remains appropriate;Frequency remains appropriate       AM-PAC OT "6 Clicks" Daily Activity     Outcome Measure   Help from another person eating meals?: None Help from another person taking care of personal grooming?: A Little Help from another person toileting, which includes using toliet, bedpan, or urinal?: A Lot Help from another person bathing (including washing, rinsing, drying)?: A Lot Help from another person to put on and taking off regular upper body clothing?: A Little Help from another person to put on and taking off regular lower body clothing?: A Lot 6 Click Score: 16    End of Session Equipment Utilized During Treatment: Gait belt;Rolling walker  OT Visit Diagnosis: Unsteadiness on feet (R26.81);Muscle weakness (generalized) (M62.81)   Activity Tolerance Patient tolerated treatment well   Patient Left in bed;with call bell/phone within reach;with bed alarm set;with family/visitor present   Nurse Communication Mobility status        Time: 8889-1694 OT Time Calculation (min): 29 min  Charges: OT General Charges $OT Visit: 1 Visit OT Treatments $Self Care/Home Management : 23-37 mins  Dessie Coma, M.S. OTR/L  10/17/20, 9:40 AM  ascom [phone]

## 2020-10-18 DIAGNOSIS — N189 Chronic kidney disease, unspecified: Secondary | ICD-10-CM | POA: Diagnosis not present

## 2020-10-18 DIAGNOSIS — N179 Acute kidney failure, unspecified: Secondary | ICD-10-CM | POA: Diagnosis not present

## 2020-10-18 LAB — CBC
HCT: 24.7 % — ABNORMAL LOW (ref 36.0–46.0)
Hemoglobin: 8.6 g/dL — ABNORMAL LOW (ref 12.0–15.0)
MCH: 30.6 pg (ref 26.0–34.0)
MCHC: 34.8 g/dL (ref 30.0–36.0)
MCV: 87.9 fL (ref 80.0–100.0)
Platelets: 78 10*3/uL — ABNORMAL LOW (ref 150–400)
RBC: 2.81 MIL/uL — ABNORMAL LOW (ref 3.87–5.11)
RDW: 18.1 % — ABNORMAL HIGH (ref 11.5–15.5)
WBC: 6.7 10*3/uL (ref 4.0–10.5)
nRBC: 29.8 % — ABNORMAL HIGH (ref 0.0–0.2)

## 2020-10-18 LAB — BASIC METABOLIC PANEL
Anion gap: 9 (ref 5–15)
BUN: 58 mg/dL — ABNORMAL HIGH (ref 8–23)
CO2: 20 mmol/L — ABNORMAL LOW (ref 22–32)
Calcium: 8.6 mg/dL — ABNORMAL LOW (ref 8.9–10.3)
Chloride: 93 mmol/L — ABNORMAL LOW (ref 98–111)
Creatinine, Ser: 1.68 mg/dL — ABNORMAL HIGH (ref 0.44–1.00)
GFR, Estimated: 29 mL/min — ABNORMAL LOW (ref >60)
Glucose, Bld: 111 mg/dL — ABNORMAL HIGH (ref 70–99)
Potassium: 4.4 mmol/L (ref 3.5–5.1)
Sodium: 122 mmol/L — ABNORMAL LOW (ref 135–145)

## 2020-10-18 NOTE — Progress Notes (Signed)
PROGRESS NOTE    Veronica Colon  XNA:[id number] DOB: Apr 27, 1932 DOA: 10/10/2020 PCP: Delsa Grana, PA-C     Brief Narrative:  Veronica Colon is an 87 year old female with history of metastatic pancreatic adenocarcinoma, history of breast and colon cancer, type 2 diabetes, MGUS, chronic kidney disease presents with weakness and inability to walk.  She was also found to have a low sodium.  Pancytopenia with counts lowering over the past couple days.  New events last 24 hours / Subjective: Patient with nearly full breakfast training, states that she has been eating.  She has no physical complaints, denies any abdominal discomfort.  Foley catheter was placed yesterday for urinary retention.  Assessment & Plan:   Active Problems:   H/O malignant neoplasm of breast   H/O malignant neoplasm of colon   CKD (chronic kidney disease), stage III (HCC)   Temporal arteritis (HCC)   Type 2 diabetes mellitus with obesity (HCC)   Monoclonal gammopathy of unknown significance (MGUS)   Primary pancreatic cancer with metastasis to other site [facility])   Dehydration   Generalized weakness   Metastatic adenocarcinoma to pancreas ([place])   Acute kidney injury superimposed on CKD (HCC)   Hyponatremia   Anemia associated with chemotherapy   Thrombocytopenia (HCC)   Hydronephrosis, left   Impaired fasting glucose   Pancytopenia ([place])   Thrush   Palliative care encounter   Metastasis from pancreatic cancer ([place])   Malnutrition of moderate degree   Hyponatremia -Patient was initially treated with IV fluid, then transition to IV albumin and Lasix.  However sodium continued to be low.  Started on tolvaptan, nephrology consulted -Per nephrology  Hypotension -Continue midodrine   Metastatic pancreatic adenocarcinoma -Appreciate oncology, palliative care medicine -Oncology planning for chemo holiday, follow-up as outpatient 10/29/2020 -PET 09/2020 showed hypermetabolic left retroperitoneal and para-aortic  lesions also pancreatic lesion and omental disease and ascites. Patient had declined paracentesis and treated with spironolactone  Pancytopenia secondary to chemotherapy -S/p Granix -Continue to monitor  AKI on CKD stage IIIb -Baseline Cr 1.5 -Has been ranging 1.5-1.79, continue to monitor closely   Failure to thrive, generalized weakness -Planning for SNF on discharge  Acute urinary retention -Has required multiple in and out cath.  Foley was placed on 4/8    DVT prophylaxis: Lovenox on hold due to pancytopenia  Code Status: DNR Family Communication: No family at bedside Disposition Plan:  Status is: Inpatient  Remains inpatient appropriate because:Inpatient level of care appropriate due to severity of illness   Dispo: The patient is from: Home              Anticipated d/c is to: SNF              Patient currently is not medically stable to d/c.  Continue to treat hyponatremia   Difficult to place patient No      Consultants:   Palliative care medicine  Oncology  Nephrology     Antimicrobials:  Anti-infectives (From admission, onward)   None       Objective: Vitals:   10/17/20 1940 10/18/20 0049 10/18/20 0357 10/18/20 0815  BP: 136/72 131/72 127/67 (!) 150/73  Pulse: 70 76 76 77  Resp: 16 16  14   Temp: 98.1 F (36.7 C) 98.2 F (36.8 C) (!) 97.4 F (36.3 C) (!) 97.5 F (36.4 C)  TempSrc: Axillary Oral Oral   SpO2: 97% 93% 98% 93%  Weight:      Height:  Intake/Output Summary (Last 24 hours) at 10/18/2020 0924 Last data filed at 10/18/2020 0400 Gross per 24 hour  Intake 360 ml  Output 700 ml  Net -340 ml   Filed Weights   10/10/20 1058 10/10/20 1715  Weight: 63.5 kg 72.7 kg    Examination: General exam: Appears calm and comfortable, frail appearing Respiratory system: Clear to auscultation. Respiratory effort normal. Cardiovascular system: S1 & S2 heard, RRR. No pedal edema. Gastrointestinal system: Abdomen is mildly distended,  soft, nontender to palpation Central nervous system: Alert Extremities: Symmetric in appearance bilaterally  Skin: No rashes, lesions or ulcers on exposed skin  Psychiatry: J Mood & affect appropriate.    Data Reviewed: I have personally reviewed following labs and imaging studies  CBC: Recent Labs  Lab 10/14/20 0754 10/15/20 0144 10/16/20 0430 10/17/20 0548 10/18/20 0500  WBC 1.3* 3.3* 7.1 8.0 6.7  HGB 8.1* 8.4* 8.3* 8.0* 8.6*  HCT 23.2* 24.5* 23.5* 23.5* 24.7*  MCV 87.5 88.8 87.4 88.7 87.9  PLT 60* 53* 37* 45* 78*   Basic Metabolic Panel: Recent Labs  Lab 10/14/20 0754 10/14/20 1047 10/14/20 1812 10/15/20 0144 10/15/20 0803 10/16/20 0430 10/17/20 0548 10/18/20 0500  NA 124* 122*   < > 125* 126* 126* 127* 122*  K 4.2 4.1  --   --  4.4 4.4 4.4 4.4  CL 96* 93*  --   --  96* 95* 97* 93*  CO2 18* 19*  --   --  19* 21* 21* 20*  GLUCOSE 99 127*  --   --  101* 120* 114* 111*  BUN 50* 51*  --   --  56* 57* 56* 58*  CREATININE 1.71* 1.64*  --   --  1.79* 1.71* 1.73* 1.68*  CALCIUM 8.7* 8.7*  --   --  9.1 8.7* 8.7* 8.6*  MG 1.9  --   --   --   --   --   --   --    < > = values in this interval not displayed.   GFR: Estimated Creatinine Clearance: 22.5 mL/min (A) (by C-G formula based on SCr of 1.68 mg/dL (H)). Liver Function Tests: Recent Labs  Lab 10/12/20 0500 10/14/20 1047  AST 86* 38  ALT 39 27  ALKPHOS 354* 246*  BILITOT 5.4* 2.3*  PROT 5.0* 5.6*  ALBUMIN 2.1* 2.7*   No results for input(s): LIPASE, AMYLASE in the last 168 hours. No results for input(s): AMMONIA in the last 168 hours. Coagulation Profile: No results for input(s): INR, PROTIME in the last 168 hours. Cardiac Enzymes: No results for input(s): CKTOTAL, CKMB, CKMBINDEX, TROPONINI in the last 168 hours. BNP (last 3 results) No results for input(s): PROBNP in the last 8760 hours. HbA1C: No results for input(s): HGBA1C in the last 72 hours. CBG: No results for input(s): GLUCAP in the last 168  hours. Lipid Profile: No results for input(s): CHOL, HDL, LDLCALC, TRIG, CHOLHDL, LDLDIRECT in the last 72 hours. Thyroid Function Tests: No results for input(s): TSH, T4TOTAL, FREET4, T3FREE, THYROIDAB in the last 72 hours. Anemia Panel: No results for input(s): VITAMINB12, FOLATE, FERRITIN, TIBC, IRON, RETICCTPCT in the last 72 hours. Sepsis Labs: No results for input(s): PROCALCITON, LATICACIDVEN in the last 168 hours.  Recent Results (from the past 240 hour(s))  Resp Panel by RT-PCR (Flu A&B, Covid) Nasopharyngeal Swab     Status: None   Collection Time: 10/10/20  2:07 PM   Specimen: Nasopharyngeal Swab; Nasopharyngeal(NP) swabs in vial transport medium  Result  Value Ref Range Status   SARS Coronavirus 2 by RT PCR NEGATIVE NEGATIVE Final    Comment: (NOTE) SARS-CoV-2 target nucleic acids are NOT DETECTED.  The SARS-CoV-2 RNA is generally detectable in upper respiratory specimens during the acute phase of infection. The lowest concentration of SARS-CoV-2 viral copies this assay can detect is 138 copies/mL. A negative result does not preclude SARS-Cov-2 infection and should not be used as the sole basis for treatment or other patient management decisions. A negative result may occur with  improper specimen collection/handling, submission of specimen other than nasopharyngeal swab, presence of viral mutation(s) within the areas targeted by this assay, and inadequate number of viral copies(<138 copies/mL). A negative result must be combined with clinical observations, patient history, and epidemiological information. The expected result is Negative.  Fact Sheet for Patients:  EntrepreneurPulse.com.au  Fact Sheet for Healthcare Providers:  IncredibleEmployment.be  This test is no t yet approved or cleared by the [place] FDA and  has been authorized for detection and/or diagnosis of SARS-CoV-2 by FDA under an Emergency Use Authorization  (EUA). This EUA will remain  in effect (meaning this test can be used) for the duration of the COVID-19 declaration under Section 564(b)(1) of the Act, 21 U.S.C.section 360bbb-3(b)(1), unless the authorization is terminated  or revoked sooner.       Influenza A by PCR NEGATIVE NEGATIVE Final   Influenza B by PCR NEGATIVE NEGATIVE Final    Comment: (NOTE) The Xpert Xpress SARS-CoV-2/FLU/RSV plus assay is intended as an aid in the diagnosis of influenza from Nasopharyngeal swab specimens and should not be used as a sole basis for treatment. Nasal washings and aspirates are unacceptable for Xpert Xpress SARS-CoV-2/FLU/RSV testing.  Fact Sheet for Patients: EntrepreneurPulse.com.au  Fact Sheet for Healthcare Providers: IncredibleEmployment.be  This test is not yet approved or cleared by the [place] FDA and has been authorized for detection and/or diagnosis of SARS-CoV-2 by FDA under an Emergency Use Authorization (EUA). This EUA will remain in effect (meaning this test can be used) for the duration of the COVID-19 declaration under Section 564(b)(1) of the Act, 21 U.S.C. section 360bbb-3(b)(1), unless the authorization is terminated or revoked.  Performed at [facility], [place], [place] [address]   SARS CORONAVIRUS 2 (TAT 6-24 HRS) Nasopharyngeal     Status: None   Collection Time: 10/14/20  2:10 PM   Specimen: Nasopharyngeal  Result Value Ref Range Status   SARS Coronavirus 2 NEGATIVE NEGATIVE Final    Comment: (NOTE) SARS-CoV-2 target nucleic acids are NOT DETECTED.  The SARS-CoV-2 RNA is generally detectable in upper and lower respiratory specimens during the acute phase of infection. Negative results do not preclude SARS-CoV-2 infection, do not rule out co-infections with other pathogens, and should not be used as the sole basis for treatment or other patient management decisions. Negative results must  be combined with clinical observations, patient history, and epidemiological information. The expected result is Negative.  Fact Sheet for Patients: SugarRoll.be  Fact Sheet for Healthcare Providers: https://www.woods-mathews.com/  This test is not yet approved or cleared by the [place] FDA and  has been authorized for detection and/or diagnosis of SARS-CoV-2 by FDA under an Emergency Use Authorization (EUA). This EUA will remain  in effect (meaning this test can be used) for the duration of the COVID-19 declaration under Se ction 564(b)(1) of the Act, 21 U.S.C. section 360bbb-3(b)(1), unless the authorization is terminated or revoked sooner.  Performed at [facility]  Lab, [address], [place], [place] [address]       Radiology Studies: No results found.    Scheduled Meds: . (feeding supplement) PROSource Plus  30 mL Oral BID BM  . ascorbic acid  1,000 mg Oral Once per day on Mon Wed Fri  . calcium-vitamin D  1 tablet Oral Daily  . Chlorhexidine Gluconate Cloth  6 each Topical Daily  . midodrine  5 mg Oral TID WC  . multivitamin with minerals  1 tablet Oral Daily  . nystatin  5 mL Oral QID  . sodium chloride flush  10-40 mL Intracatheter Q12H  . spironolactone  50 mg Oral Daily   Continuous Infusions: . methocarbamol (ROBAXIN) IV       LOS: 8 days      Time spent: 20 minutes   Dessa Phi, DO [facility] 10/18/2020, 9:24 AM   Available via Epic secure chat 7am-7pm After these hours, please refer to coverage provider listed on [email or web address]

## 2020-10-18 NOTE — Progress Notes (Signed)
[facility]  PROGRESS NOTE   Subjective:   Patient seen at bedside, lethargic but arousable. Patient's daughter is in attendance. Oral intake is very poor.  Objective:  Vital signs in last 24 hours:  Temp:  [97.4 F (36.3 C)-98.2 F (36.8 C)] 97.5 F (36.4 C) (04/09 0815) Pulse Rate:  [70-84] 77 (04/09 0815) Resp:  [14-18] 14 (04/09 0815) BP: (119-150)/(67-73) 150/73 (04/09 0815) SpO2:  [93 %-98 %] 93 % (04/09 0815)  Weight change:  Filed Weights   10/10/20 1058 10/10/20 1715  Weight: 63.5 kg 72.7 kg    Intake/Output: I/O last 3 completed shifts: In: 360 [P.O.:360] Out: 1575 [Urine:1575]   Intake/Output this shift:  Total I/O In: 180 [P.O.:180] Out: -   Physical Exam: General:  No acute distress  Head:  Normocephalic, atraumatic. Moist oral mucosal membranes  Eyes:  Anicteric  Neck:  Supple  Lungs:   Clear to auscultation, normal effort  Heart:  S1S2 no rubs  Abdomen:   Soft, nontender, bowel sounds present  Extremities:  peripheral edema.  Neurologic:  Awake, alert, following commands  Skin:  No lesions  Access:     Basic Metabolic Panel: Recent Labs  Lab 10/14/20 0754 10/14/20 1047 10/14/20 1812 10/15/20 0144 10/15/20 0803 10/16/20 0430 10/17/20 0548 10/18/20 0500  NA 124* 122*   < > 125* 126* 126* 127* 122*  K 4.2 4.1  --   --  4.4 4.4 4.4 4.4  CL 96* 93*  --   --  96* 95* 97* 93*  CO2 18* 19*  --   --  19* 21* 21* 20*  GLUCOSE 99 127*  --   --  101* 120* 114* 111*  BUN 50* 51*  --   --  56* 57* 56* 58*  CREATININE 1.71* 1.64*  --   --  1.79* 1.71* 1.73* 1.68*  CALCIUM 8.7* 8.7*  --   --  9.1 8.7* 8.7* 8.6*  MG 1.9  --   --   --   --   --   --   --    < > = values in this interval not displayed.    Liver Function Tests: Recent Labs  Lab 10/12/20 0500 10/14/20 1047  AST 86* 38  ALT 39 27  ALKPHOS 354* 246*  BILITOT 5.4* 2.3*  PROT 5.0* 5.6*  ALBUMIN 2.1* 2.7*   No results for input(s): LIPASE, AMYLASE in the last 168  hours. No results for input(s): AMMONIA in the last 168 hours.  CBC: Recent Labs  Lab 10/14/20 0754 10/15/20 0144 10/16/20 0430 10/17/20 0548 10/18/20 0500  WBC 1.3* 3.3* 7.1 8.0 6.7  HGB 8.1* 8.4* 8.3* 8.0* 8.6*  HCT 23.2* 24.5* 23.5* 23.5* 24.7*  MCV 87.5 88.8 87.4 88.7 87.9  PLT 60* 53* 37* 45* 78*    Cardiac Enzymes: No results for input(s): CKTOTAL, CKMB, CKMBINDEX, TROPONINI in the last 168 hours.  BNP: Invalid input(s): POCBNP  CBG: No results for input(s): GLUCAP in the last 168 hours.  Microbiology: Results for orders placed or performed during the hospital encounter of 10/10/20  Resp Panel by RT-PCR (Flu A&B, Covid) Nasopharyngeal Swab     Status: None   Collection Time: 10/10/20  2:07 PM   Specimen: Nasopharyngeal Swab; Nasopharyngeal(NP) swabs in vial transport medium  Result Value Ref Range Status   SARS Coronavirus 2 by RT PCR NEGATIVE NEGATIVE Final    Comment: (NOTE) SARS-CoV-2 target nucleic acids are NOT DETECTED.  The SARS-CoV-2 RNA is generally  detectable in upper respiratory specimens during the acute phase of infection. The lowest concentration of SARS-CoV-2 viral copies this assay can detect is 138 copies/mL. A negative result does not preclude SARS-Cov-2 infection and should not be used as the sole basis for treatment or other patient management decisions. A negative result may occur with  improper specimen collection/handling, submission of specimen other than nasopharyngeal swab, presence of viral mutation(s) within the areas targeted by this assay, and inadequate number of viral copies(<138 copies/mL). A negative result must be combined with clinical observations, patient history, and epidemiological information. The expected result is Negative.  Fact Sheet for Patients:  EntrepreneurPulse.com.au  Fact Sheet for Healthcare Providers:  IncredibleEmployment.be  This test is no t yet approved or cleared  by the [place] FDA and  has been authorized for detection and/or diagnosis of SARS-CoV-2 by FDA under an Emergency Use Authorization (EUA). This EUA will remain  in effect (meaning this test can be used) for the duration of the COVID-19 declaration under Section 564(b)(1) of the Act, 21 U.S.C.section 360bbb-3(b)(1), unless the authorization is terminated  or revoked sooner.       Influenza A by PCR NEGATIVE NEGATIVE Final   Influenza B by PCR NEGATIVE NEGATIVE Final    Comment: (NOTE) The Xpert Xpress SARS-CoV-2/FLU/RSV plus assay is intended as an aid in the diagnosis of influenza from Nasopharyngeal swab specimens and should not be used as a sole basis for treatment. Nasal washings and aspirates are unacceptable for Xpert Xpress SARS-CoV-2/FLU/RSV testing.  Fact Sheet for Patients: EntrepreneurPulse.com.au  Fact Sheet for Healthcare Providers: IncredibleEmployment.be  This test is not yet approved or cleared by the [place] FDA and has been authorized for detection and/or diagnosis of SARS-CoV-2 by FDA under an Emergency Use Authorization (EUA). This EUA will remain in effect (meaning this test can be used) for the duration of the COVID-19 declaration under Section 564(b)(1) of the Act, 21 U.S.C. section 360bbb-3(b)(1), unless the authorization is terminated or revoked.  Performed at [facility], [place], [place] [address]   SARS CORONAVIRUS 2 (TAT 6-24 HRS) Nasopharyngeal     Status: None   Collection Time: 10/14/20  2:10 PM   Specimen: Nasopharyngeal  Result Value Ref Range Status   SARS Coronavirus 2 NEGATIVE NEGATIVE Final    Comment: (NOTE) SARS-CoV-2 target nucleic acids are NOT DETECTED.  The SARS-CoV-2 RNA is generally detectable in upper and lower respiratory specimens during the acute phase of infection. Negative results do not preclude SARS-CoV-2 infection, do not rule  out co-infections with other pathogens, and should not be used as the sole basis for treatment or other patient management decisions. Negative results must be combined with clinical observations, patient history, and epidemiological information. The expected result is Negative.  Fact Sheet for Patients: SugarRoll.be  Fact Sheet for Healthcare Providers: https://www.woods-mathews.com/  This test is not yet approved or cleared by the [place] FDA and  has been authorized for detection and/or diagnosis of SARS-CoV-2 by FDA under an Emergency Use Authorization (EUA). This EUA will remain  in effect (meaning this test can be used) for the duration of the COVID-19 declaration under Se ction 564(b)(1) of the Act, 21 U.S.C. section 360bbb-3(b)(1), unless the authorization is terminated or revoked sooner.  Performed at [facility], [place] [address], [place], [place] [address]     Coagulation Studies: No results for input(s): LABPROT, INR in the last 72 hours.  Urinalysis: No results for input(s): COLORURINE, LABSPEC,  PHURINE, GLUCOSEU, HGBUR, BILIRUBINUR, KETONESUR, PROTEINUR, UROBILINOGEN, NITRITE, LEUKOCYTESUR in the last 72 hours.  Invalid input(s): APPERANCEUR    Imaging: No results found.   Medications:   . methocarbamol (ROBAXIN) IV     . (feeding supplement) PROSource Plus  30 mL Oral BID BM  . ascorbic acid  1,000 mg Oral Once per day on Mon Wed Fri  . calcium-vitamin D  1 tablet Oral Daily  . Chlorhexidine Gluconate Cloth  6 each Topical Daily  . midodrine  5 mg Oral TID WC  . multivitamin with minerals  1 tablet Oral Daily  . nystatin  5 mL Oral QID  . sodium chloride flush  10-40 mL Intracatheter Q12H  . spironolactone  50 mg Oral Daily     Assessment/ Plan:     Active Problems:   H/O malignant neoplasm of breast   H/O malignant neoplasm of colon   CKD (chronic kidney disease), stage III (HCC)   Temporal  arteritis (HCC)   Type 2 diabetes mellitus with obesity (HCC)   Monoclonal gammopathy of unknown significance (MGUS)   Primary pancreatic cancer with metastasis to other site [facility])   Dehydration   Generalized weakness   Metastatic adenocarcinoma to pancreas ([place])   Acute kidney injury superimposed on CKD (HCC)   Hyponatremia   Anemia associated with chemotherapy   Thrombocytopenia (HCC)   Hydronephrosis, left   Impaired fasting glucose   Pancytopenia ([place])   Thrush   Palliative care encounter   Metastasis from pancreatic cancer ([place])   Malnutrition of moderate degree   87 y.o. female with history of metastatic pancreatic carcinoma, history of breast and colon cancer, diabetes, MGUS, chronic kidney disease now admitted with history of failure to thrive.  As per patient's daughter her general status has declined since initiation of the chemotherapy.  She is found to have hyponatremia and was given a dose of Samsca..  She was also given IV Lasix along with albumin.  She is presently on Aldactone. Renal indicis are possibly close to her baseline. Spoke to the patient's daughter at bedside and opined that comfort care probably is more appropriate. We will continue to follow during hospitalization.   LOS: [place] 4/9/202211:58 AM

## 2020-10-19 DIAGNOSIS — N179 Acute kidney failure, unspecified: Secondary | ICD-10-CM | POA: Diagnosis not present

## 2020-10-19 DIAGNOSIS — N189 Chronic kidney disease, unspecified: Secondary | ICD-10-CM | POA: Diagnosis not present

## 2020-10-19 LAB — CBC
HCT: 25.9 % — ABNORMAL LOW (ref 36.0–46.0)
Hemoglobin: 9.1 g/dL — ABNORMAL LOW (ref 12.0–15.0)
MCH: 30.6 pg (ref 26.0–34.0)
MCHC: 35.1 g/dL (ref 30.0–36.0)
MCV: 87.2 fL (ref 80.0–100.0)
Platelets: 140 10*3/uL — ABNORMAL LOW (ref 150–400)
RBC: 2.97 MIL/uL — ABNORMAL LOW (ref 3.87–5.11)
RDW: 18.3 % — ABNORMAL HIGH (ref 11.5–15.5)
WBC: 5.6 10*3/uL (ref 4.0–10.5)
nRBC: 51.3 % — ABNORMAL HIGH (ref 0.0–0.2)

## 2020-10-19 LAB — BASIC METABOLIC PANEL
Anion gap: 10 (ref 5–15)
BUN: 58 mg/dL — ABNORMAL HIGH (ref 8–23)
CO2: 19 mmol/L — ABNORMAL LOW (ref 22–32)
Calcium: 8.7 mg/dL — ABNORMAL LOW (ref 8.9–10.3)
Chloride: 94 mmol/L — ABNORMAL LOW (ref 98–111)
Creatinine, Ser: 1.74 mg/dL — ABNORMAL HIGH (ref 0.44–1.00)
GFR, Estimated: 28 mL/min — ABNORMAL LOW (ref >60)
Glucose, Bld: 114 mg/dL — ABNORMAL HIGH (ref 70–99)
Potassium: 4.8 mmol/L (ref 3.5–5.1)
Sodium: 123 mmol/L — ABNORMAL LOW (ref 135–145)

## 2020-10-19 MED ORDER — SODIUM CHLORIDE 1 G PO TABS
1.0000 g | ORAL_TABLET | Freq: Two times a day (BID) | ORAL | Status: DC
  Administered 2020-10-19 – 2020-10-20 (×2): 1 g via ORAL
  Filled 2020-10-19 (×2): qty 1

## 2020-10-19 NOTE — Progress Notes (Signed)
[facility]  PROGRESS NOTE   Subjective:   More awake and alert today. Denies any chest pain or shortness of breath.  Objective:  Vital signs in last 24 hours:  Temp:  [97.5 F (36.4 C)-99 F (37.2 C)] 98.4 F (36.9 C) (04/10 0814) Pulse Rate:  [72-77] 77 (04/10 0814) Resp:  [14-18] 18 (04/10 0814) BP: (130-135)/(69-72) 133/70 (04/10 0814) SpO2:  [96 %-98 %] 98 % (04/10 0814)  Weight change:  Filed Weights   10/10/20 1058 10/10/20 1715  Weight: 63.5 kg 72.7 kg    Intake/Output: I/O last 3 completed shifts: In: 420 [P.O.:420] Out: 900 [Urine:900]   Intake/Output this shift:  No intake/output data recorded.  Physical Exam: General:  No acute distress  Head:  Normocephalic, atraumatic. Moist oral mucosal membranes  Eyes:  Anicteric  Neck:  Supple  Lungs:   Clear to auscultation, normal effort  Heart:  S1S2 no rubs  Abdomen:   Soft, nontender, bowel sounds present  Extremities:  peripheral edema.  Neurologic:  Awake, alert, following commands  Skin:  No lesions  Access:     Basic Metabolic Panel: Recent Labs  Lab 10/14/20 0754 10/14/20 1047 10/15/20 0803 10/16/20 0430 10/17/20 0548 10/18/20 0500 10/19/20 0537  NA 124*   < > 126* 126* 127* 122* 123*  K 4.2   < > 4.4 4.4 4.4 4.4 4.8  CL 96*   < > 96* 95* 97* 93* 94*  CO2 18*   < > 19* 21* 21* 20* 19*  GLUCOSE 99   < > 101* 120* 114* 111* 114*  BUN 50*   < > 56* 57* 56* 58* 58*  CREATININE 1.71*   < > 1.79* 1.71* 1.73* 1.68* 1.74*  CALCIUM 8.7*   < > 9.1 8.7* 8.7* 8.6* 8.7*  MG 1.9  --   --   --   --   --   --    < > = values in this interval not displayed.    Liver Function Tests: Recent Labs  Lab 10/14/20 1047  AST 38  ALT 27  ALKPHOS 246*  BILITOT 2.3*  PROT 5.6*  ALBUMIN 2.7*   No results for input(s): LIPASE, AMYLASE in the last 168 hours. No results for input(s): AMMONIA in the last 168 hours.  CBC: Recent Labs  Lab 10/15/20 0144 10/16/20 0430 10/17/20 0548  10/18/20 0500 10/19/20 0537  WBC 3.3* 7.1 8.0 6.7 5.6  HGB 8.4* 8.3* 8.0* 8.6* 9.1*  HCT 24.5* 23.5* 23.5* 24.7* 25.9*  MCV 88.8 87.4 88.7 87.9 87.2  PLT 53* 37* 45* 78* 140*    Cardiac Enzymes: No results for input(s): CKTOTAL, CKMB, CKMBINDEX, TROPONINI in the last 168 hours.  BNP: Invalid input(s): POCBNP  CBG: No results for input(s): GLUCAP in the last 168 hours.  Microbiology: Results for orders placed or performed during the hospital encounter of 10/10/20  Resp Panel by RT-PCR (Flu A&B, Covid) Nasopharyngeal Swab     Status: None   Collection Time: 10/10/20  2:07 PM   Specimen: Nasopharyngeal Swab; Nasopharyngeal(NP) swabs in vial transport medium  Result Value Ref Range Status   SARS Coronavirus 2 by RT PCR NEGATIVE NEGATIVE Final    Comment: (NOTE) SARS-CoV-2 target nucleic acids are NOT DETECTED.  The SARS-CoV-2 RNA is generally detectable in upper respiratory specimens during the acute phase of infection. The lowest concentration of SARS-CoV-2 viral copies this assay can detect is 138 copies/mL. A negative result does not preclude SARS-Cov-2 infection and should not  be used as the sole basis for treatment or other patient management decisions. A negative result may occur with  improper specimen collection/handling, submission of specimen other than nasopharyngeal swab, presence of viral mutation(s) within the areas targeted by this assay, and inadequate number of viral copies(<138 copies/mL). A negative result must be combined with clinical observations, patient history, and epidemiological information. The expected result is Negative.  Fact Sheet for Patients:  EntrepreneurPulse.com.au  Fact Sheet for Healthcare Providers:  IncredibleEmployment.be  This test is no t yet approved or cleared by the [place] FDA and  has been authorized for detection and/or diagnosis of SARS-CoV-2 by FDA under an Emergency Use  Authorization (EUA). This EUA will remain  in effect (meaning this test can be used) for the duration of the COVID-19 declaration under Section 564(b)(1) of the Act, 21 U.S.C.section 360bbb-3(b)(1), unless the authorization is terminated  or revoked sooner.       Influenza A by PCR NEGATIVE NEGATIVE Final   Influenza B by PCR NEGATIVE NEGATIVE Final    Comment: (NOTE) The Xpert Xpress SARS-CoV-2/FLU/RSV plus assay is intended as an aid in the diagnosis of influenza from Nasopharyngeal swab specimens and should not be used as a sole basis for treatment. Nasal washings and aspirates are unacceptable for Xpert Xpress SARS-CoV-2/FLU/RSV testing.  Fact Sheet for Patients: EntrepreneurPulse.com.au  Fact Sheet for Healthcare Providers: IncredibleEmployment.be  This test is not yet approved or cleared by the [place] FDA and has been authorized for detection and/or diagnosis of SARS-CoV-2 by FDA under an Emergency Use Authorization (EUA). This EUA will remain in effect (meaning this test can be used) for the duration of the COVID-19 declaration under Section 564(b)(1) of the Act, 21 U.S.C. section 360bbb-3(b)(1), unless the authorization is terminated or revoked.  Performed at [facility], [place], [place] [address]   SARS CORONAVIRUS 2 (TAT 6-24 HRS) Nasopharyngeal     Status: None   Collection Time: 10/14/20  2:10 PM   Specimen: Nasopharyngeal  Result Value Ref Range Status   SARS Coronavirus 2 NEGATIVE NEGATIVE Final    Comment: (NOTE) SARS-CoV-2 target nucleic acids are NOT DETECTED.  The SARS-CoV-2 RNA is generally detectable in upper and lower respiratory specimens during the acute phase of infection. Negative results do not preclude SARS-CoV-2 infection, do not rule out co-infections with other pathogens, and should not be used as the sole basis for treatment or other patient management  decisions. Negative results must be combined with clinical observations, patient history, and epidemiological information. The expected result is Negative.  Fact Sheet for Patients: SugarRoll.be  Fact Sheet for Healthcare Providers: https://www.woods-mathews.com/  This test is not yet approved or cleared by the [place] FDA and  has been authorized for detection and/or diagnosis of SARS-CoV-2 by FDA under an Emergency Use Authorization (EUA). This EUA will remain  in effect (meaning this test can be used) for the duration of the COVID-19 declaration under Se ction 564(b)(1) of the Act, 21 U.S.C. section 360bbb-3(b)(1), unless the authorization is terminated or revoked sooner.  Performed at [facility], [place] [address], [place], [place] [address]     Coagulation Studies: No results for input(s): LABPROT, INR in the last 72 hours.  Urinalysis: No results for input(s): COLORURINE, LABSPEC, PHURINE, GLUCOSEU, HGBUR, BILIRUBINUR, KETONESUR, PROTEINUR, UROBILINOGEN, NITRITE, LEUKOCYTESUR in the last 72 hours.  Invalid input(s): APPERANCEUR    Imaging: No results found.   Medications:   . methocarbamol (ROBAXIN) IV     . (  feeding supplement) PROSource Plus  30 mL Oral BID BM  . ascorbic acid  1,000 mg Oral Once per day on Mon Wed Fri  . calcium-vitamin D  1 tablet Oral Daily  . Chlorhexidine Gluconate Cloth  6 each Topical Daily  . midodrine  5 mg Oral TID WC  . multivitamin with minerals  1 tablet Oral Daily  . nystatin  5 mL Oral QID  . sodium chloride flush  10-40 mL Intracatheter Q12H  . spironolactone  50 mg Oral Daily    Assessment/ Plan:     Active Problems:   H/O malignant neoplasm of breast   H/O malignant neoplasm of colon   CKD (chronic kidney disease), stage III (HCC)   Temporal arteritis (HCC)   Type 2 diabetes mellitus with obesity (HCC)   Monoclonal gammopathy of unknown significance (MGUS)    Primary pancreatic cancer with metastasis to other site [facility])   Dehydration   Generalized weakness   Metastatic adenocarcinoma to pancreas ([place])   Acute kidney injury superimposed on CKD (HCC)   Hyponatremia   Anemia associated with chemotherapy   Thrombocytopenia (HCC)   Hydronephrosis, left   Impaired fasting glucose   Pancytopenia ([place])   Thrush   Palliative care encounter   Metastasis from pancreatic cancer ([place])   Malnutrition of moderate degree  87 y.o. female with history of metastatic pancreatic carcinoma, history of breast and colon cancer, diabetes, MGUS, chronic kidney disease now admitted with history of failure to thrive.  As per patient's daughter her general status has declined since initiation of the chemotherapy.  She is found to have hyponatremia and was given a dose of Samsca..  We will give her salt tablets today. She was also given IV Lasix along with albumin.  She is presently on Aldactone. Renal indicis are possibly close to her baseline. Spoke to the patient's daughter at bedside and opined that comfort care probably is more appropriate. We will continue to follow during hospitalization.   LOS: [facility] 4/10/20229:53 AM

## 2020-10-19 NOTE — Progress Notes (Signed)
PROGRESS NOTE    Veronica Colon  LKT:[id number] DOB: June 15, 1932 DOA: 10/10/2020 PCP: Delsa Grana, PA-C     Brief Narrative:  Veronica Colon is an 87 year old female with history of metastatic pancreatic adenocarcinoma, history of breast and colon cancer, type 2 diabetes, MGUS, chronic kidney disease presents with weakness and inability to walk.  She was also found to have a low sodium.  Pancytopenia with counts lowering over the past couple days.  New events last 24 hours / Subjective: Patient seen with daughter at bedside.  Patient herself denies any complaints.  Daughter noticed that patient is having some swelling in the lower extremities.  She denies any abdominal pain.  Assessment & Plan:   Active Problems:   H/O malignant neoplasm of breast   H/O malignant neoplasm of colon   CKD (chronic kidney disease), stage III (HCC)   Temporal arteritis (HCC)   Type 2 diabetes mellitus with obesity (HCC)   Monoclonal gammopathy of unknown significance (MGUS)   Primary pancreatic cancer with metastasis to other site [facility])   Dehydration   Generalized weakness   Metastatic adenocarcinoma to pancreas ([place])   Acute kidney injury superimposed on CKD (HCC)   Hyponatremia   Anemia associated with chemotherapy   Thrombocytopenia (HCC)   Hydronephrosis, left   Impaired fasting glucose   Pancytopenia ([place])   Thrush   Palliative care encounter   Metastasis from pancreatic cancer ([place])   Malnutrition of moderate degree   Hyponatremia -Patient was initially treated with IV fluid, then transition to IV albumin and Lasix.  However sodium continued to be low.  Started on tolvaptan, nephrology consulted -Per nephrology -Added salt tabs today  Hypotension -Continue midodrine   Metastatic pancreatic adenocarcinoma -Appreciate oncology, palliative care medicine -Oncology planning for chemo holiday, follow-up as outpatient 10/29/2020 -PET 09/2020 showed hypermetabolic left retroperitoneal and  para-aortic lesions also pancreatic lesion and omental disease and ascites. Patient had declined paracentesis and treated with spironolactone  Pancytopenia secondary to chemotherapy -S/p Granix -Continue to monitor  AKI on CKD stage IIIb -Baseline Cr 1.5 -Has been ranging 1.5-1.79, continue to monitor closely   Failure to thrive, generalized weakness -Planning for SNF on discharge  Acute urinary retention -Has required multiple in and out cath.  Foley was placed on 4/8    DVT prophylaxis: Lovenox on hold due to pancytopenia  Code Status: DNR Family Communication: Daughter at bedside Disposition Plan:  Status is: Inpatient  Remains inpatient appropriate because:Inpatient level of care appropriate due to severity of illness   Dispo: The patient is from: Home              Anticipated d/c is to: SNF              Patient currently is not medically stable to d/c.  Continue to treat hyponatremia, discussed with daughter regarding home hospice.  Daughter is considering.  We will have palliative care see patient again on Monday.   Difficult to place patient No      Consultants:   Palliative care medicine  Oncology  Nephrology     Antimicrobials:  Anti-infectives (From admission, onward)   None       Objective: Vitals:   10/18/20 2101 10/19/20 0003 10/19/20 0427 10/19/20 0814  BP: 130/70 134/71 134/72 133/70  Pulse: 75 77 75 77  Resp: 16 18 16 18   Temp: 97.8 F (36.6 C) 99 F (37.2 C) (!) 97.5 F (36.4 C) 98.4 F (36.9 C)  TempSrc: Axillary Axillary Axillary  Oral  SpO2: 96% 96% 98% 98%  Weight:      Height:        Intake/Output Summary (Last 24 hours) at 10/19/2020 1142 Last data filed at 10/18/2020 1836 Gross per 24 hour  Intake 240 ml  Output 450 ml  Net -210 ml   Filed Weights   10/10/20 1058 10/10/20 1715  Weight: 63.5 kg 72.7 kg    Examination: General exam: Appears calm and comfortable, weak appearing Respiratory system: Clear to  auscultation. Respiratory effort normal. Cardiovascular system: S1 & S2 heard, RRR.  Trace nonpitting pedal edema. Gastrointestinal system: Abdomen is nondistended, soft and nontender. Normal bowel sounds heard. Central nervous system: Alert and oriented. Non focal exam.  Extremities: Symmetric in appearance bilaterally  Skin: No rashes, lesions or ulcers on exposed skin  Psychiatry: Judgement and insight appear stable. Mood & affect appropriate.    Data Reviewed: I have personally reviewed following labs and imaging studies  CBC: Recent Labs  Lab 10/15/20 0144 10/16/20 0430 10/17/20 0548 10/18/20 0500 10/19/20 0537  WBC 3.3* 7.1 8.0 6.7 5.6  HGB 8.4* 8.3* 8.0* 8.6* 9.1*  HCT 24.5* 23.5* 23.5* 24.7* 25.9*  MCV 88.8 87.4 88.7 87.9 87.2  PLT 53* 37* 45* 78* 751*   Basic Metabolic Panel: Recent Labs  Lab 10/14/20 0754 10/14/20 1047 10/15/20 0803 10/16/20 0430 10/17/20 0548 10/18/20 0500 10/19/20 0537  NA 124*   < > 126* 126* 127* 122* 123*  K 4.2   < > 4.4 4.4 4.4 4.4 4.8  CL 96*   < > 96* 95* 97* 93* 94*  CO2 18*   < > 19* 21* 21* 20* 19*  GLUCOSE 99   < > 101* 120* 114* 111* 114*  BUN 50*   < > 56* 57* 56* 58* 58*  CREATININE 1.71*   < > 1.79* 1.71* 1.73* 1.68* 1.74*  CALCIUM 8.7*   < > 9.1 8.7* 8.7* 8.6* 8.7*  MG 1.9  --   --   --   --   --   --    < > = values in this interval not displayed.   GFR: Estimated Creatinine Clearance: 21.8 mL/min (A) (by C-G formula based on SCr of 1.74 mg/dL (H)). Liver Function Tests: Recent Labs  Lab 10/14/20 1047  AST 38  ALT 27  ALKPHOS 246*  BILITOT 2.3*  PROT 5.6*  ALBUMIN 2.7*   No results for input(s): LIPASE, AMYLASE in the last 168 hours. No results for input(s): AMMONIA in the last 168 hours. Coagulation Profile: No results for input(s): INR, PROTIME in the last 168 hours. Cardiac Enzymes: No results for input(s): CKTOTAL, CKMB, CKMBINDEX, TROPONINI in the last 168 hours. BNP (last 3 results) No results for  input(s): PROBNP in the last 8760 hours. HbA1C: No results for input(s): HGBA1C in the last 72 hours. CBG: No results for input(s): GLUCAP in the last 168 hours. Lipid Profile: No results for input(s): CHOL, HDL, LDLCALC, TRIG, CHOLHDL, LDLDIRECT in the last 72 hours. Thyroid Function Tests: No results for input(s): TSH, T4TOTAL, FREET4, T3FREE, THYROIDAB in the last 72 hours. Anemia Panel: No results for input(s): VITAMINB12, FOLATE, FERRITIN, TIBC, IRON, RETICCTPCT in the last 72 hours. Sepsis Labs: No results for input(s): PROCALCITON, LATICACIDVEN in the last 168 hours.  Recent Results (from the past 240 hour(s))  Resp Panel by RT-PCR (Flu A&B, Covid) Nasopharyngeal Swab     Status: None   Collection Time: 10/10/20  2:07 PM   Specimen: Nasopharyngeal Swab;  Nasopharyngeal(NP) swabs in vial transport medium  Result Value Ref Range Status   SARS Coronavirus 2 by RT PCR NEGATIVE NEGATIVE Final    Comment: (NOTE) SARS-CoV-2 target nucleic acids are NOT DETECTED.  The SARS-CoV-2 RNA is generally detectable in upper respiratory specimens during the acute phase of infection. The lowest concentration of SARS-CoV-2 viral copies this assay can detect is 138 copies/mL. A negative result does not preclude SARS-Cov-2 infection and should not be used as the sole basis for treatment or other patient management decisions. A negative result may occur with  improper specimen collection/handling, submission of specimen other than nasopharyngeal swab, presence of viral mutation(s) within the areas targeted by this assay, and inadequate number of viral copies(<138 copies/mL). A negative result must be combined with clinical observations, patient history, and epidemiological information. The expected result is Negative.  Fact Sheet for Patients:  EntrepreneurPulse.com.au  Fact Sheet for Healthcare Providers:  IncredibleEmployment.be  This test is no t yet  approved or cleared by the [place] FDA and  has been authorized for detection and/or diagnosis of SARS-CoV-2 by FDA under an Emergency Use Authorization (EUA). This EUA will remain  in effect (meaning this test can be used) for the duration of the COVID-19 declaration under Section 564(b)(1) of the Act, 21 U.S.C.section 360bbb-3(b)(1), unless the authorization is terminated  or revoked sooner.       Influenza A by PCR NEGATIVE NEGATIVE Final   Influenza B by PCR NEGATIVE NEGATIVE Final    Comment: (NOTE) The Xpert Xpress SARS-CoV-2/FLU/RSV plus assay is intended as an aid in the diagnosis of influenza from Nasopharyngeal swab specimens and should not be used as a sole basis for treatment. Nasal washings and aspirates are unacceptable for Xpert Xpress SARS-CoV-2/FLU/RSV testing.  Fact Sheet for Patients: EntrepreneurPulse.com.au  Fact Sheet for Healthcare Providers: IncredibleEmployment.be  This test is not yet approved or cleared by the [place] FDA and has been authorized for detection and/or diagnosis of SARS-CoV-2 by FDA under an Emergency Use Authorization (EUA). This EUA will remain in effect (meaning this test can be used) for the duration of the COVID-19 declaration under Section 564(b)(1) of the Act, 21 U.S.C. section 360bbb-3(b)(1), unless the authorization is terminated or revoked.  Performed at [facility], [place], [place] [address]   SARS CORONAVIRUS 2 (TAT 6-24 HRS) Nasopharyngeal     Status: None   Collection Time: 10/14/20  2:10 PM   Specimen: Nasopharyngeal  Result Value Ref Range Status   SARS Coronavirus 2 NEGATIVE NEGATIVE Final    Comment: (NOTE) SARS-CoV-2 target nucleic acids are NOT DETECTED.  The SARS-CoV-2 RNA is generally detectable in upper and lower respiratory specimens during the acute phase of infection. Negative results do not preclude SARS-CoV-2 infection, do not  rule out co-infections with other pathogens, and should not be used as the sole basis for treatment or other patient management decisions. Negative results must be combined with clinical observations, patient history, and epidemiological information. The expected result is Negative.  Fact Sheet for Patients: SugarRoll.be  Fact Sheet for Healthcare Providers: https://www.woods-mathews.com/  This test is not yet approved or cleared by the [place] FDA and  has been authorized for detection and/or diagnosis of SARS-CoV-2 by FDA under an Emergency Use Authorization (EUA). This EUA will remain  in effect (meaning this test can be used) for the duration of the COVID-19 declaration under Se ction 564(b)(1) of the Act, 21 U.S.C. section 360bbb-3(b)(1), unless the authorization is terminated or  revoked sooner.  Performed at [facility], [place] [address] [place], [place] [address]       Radiology Studies: No results found.    Scheduled Meds: . (feeding supplement) PROSource Plus  30 mL Oral BID BM  . ascorbic acid  1,000 mg Oral Once per day on Mon Wed Fri  . calcium-vitamin D  1 tablet Oral Daily  . Chlorhexidine Gluconate Cloth  6 each Topical Daily  . midodrine  5 mg Oral TID WC  . multivitamin with minerals  1 tablet Oral Daily  . nystatin  5 mL Oral QID  . sodium chloride flush  10-40 mL Intracatheter Q12H  . sodium chloride  1 g Oral BID WC  . spironolactone  50 mg Oral Daily   Continuous Infusions: . methocarbamol (ROBAXIN) IV       LOS: 9 days      Time spent: 30 minutes   Dessa Phi, DO [facility] 10/19/2020, 11:42 AM   Available via Epic secure chat 7am-7pm After these hours, please refer to coverage provider listed on [email or web address]

## 2020-10-19 NOTE — Plan of Care (Signed)
  Problem: Clinical Measurements: Goal: Will remain free from infection Outcome: Progressing   Problem: Pain Managment: Goal: General experience of comfort will improve Outcome: Progressing   Problem: Safety: Goal: Ability to remain free from injury will improve Outcome: Progressing   Problem: Skin Integrity: Goal: Risk for impaired skin integrity will decrease Outcome: Progressing   

## 2020-10-20 DIAGNOSIS — Z515 Encounter for palliative care: Secondary | ICD-10-CM | POA: Diagnosis not present

## 2020-10-20 DIAGNOSIS — N189 Chronic kidney disease, unspecified: Secondary | ICD-10-CM | POA: Diagnosis not present

## 2020-10-20 DIAGNOSIS — N179 Acute kidney failure, unspecified: Secondary | ICD-10-CM | POA: Diagnosis not present

## 2020-10-20 LAB — BASIC METABOLIC PANEL
Anion gap: 10 (ref 5–15)
BUN: 64 mg/dL — ABNORMAL HIGH (ref 8–23)
CO2: 20 mmol/L — ABNORMAL LOW (ref 22–32)
Calcium: 8.5 mg/dL — ABNORMAL LOW (ref 8.9–10.3)
Chloride: 92 mmol/L — ABNORMAL LOW (ref 98–111)
Creatinine, Ser: 1.88 mg/dL — ABNORMAL HIGH (ref 0.44–1.00)
GFR, Estimated: 26 mL/min — ABNORMAL LOW (ref >60)
Glucose, Bld: 126 mg/dL — ABNORMAL HIGH (ref 70–99)
Potassium: 5 mmol/L (ref 3.5–5.1)
Sodium: 122 mmol/L — ABNORMAL LOW (ref 135–145)

## 2020-10-20 LAB — CBC
HCT: 26.5 % — ABNORMAL LOW (ref 36.0–46.0)
Hemoglobin: 8.9 g/dL — ABNORMAL LOW (ref 12.0–15.0)
MCH: 30 pg (ref 26.0–34.0)
MCHC: 33.6 g/dL (ref 30.0–36.0)
MCV: 89.2 fL (ref 80.0–100.0)
Platelets: 190 10*3/uL (ref 150–400)
RBC: 2.97 MIL/uL — ABNORMAL LOW (ref 3.87–5.11)
RDW: 18.4 % — ABNORMAL HIGH (ref 11.5–15.5)
WBC: 7.2 10*3/uL (ref 4.0–10.5)
nRBC: 58.2 % — ABNORMAL HIGH (ref 0.0–0.2)

## 2020-10-20 LAB — SODIUM: Sodium: 122 mmol/L — ABNORMAL LOW (ref 135–145)

## 2020-10-20 MED ORDER — SODIUM CHLORIDE 1 G PO TABS
1.0000 g | ORAL_TABLET | Freq: Three times a day (TID) | ORAL | Status: DC
  Administered 2020-10-20 – 2020-10-22 (×6): 1 g via ORAL
  Filled 2020-10-20 (×6): qty 1

## 2020-10-20 MED ORDER — TOLVAPTAN 15 MG PO TABS
15.0000 mg | ORAL_TABLET | Freq: Every day | ORAL | Status: DC
  Administered 2020-10-21: 13:00:00 15 mg via ORAL
  Filled 2020-10-20 (×2): qty 1

## 2020-10-20 NOTE — Care Management Important Message (Signed)
Important Message  Patient Details  Name: Veronica Colon MRN: [id number] Date of Birth: 06-16-1932   Medicare Important Message Given:  Yes     Dannette Barbara 10/20/2020, 12:00 PM

## 2020-10-20 NOTE — Progress Notes (Signed)
PROGRESS NOTE    Veronica Colon  YSA:[id number] DOB: Aug 10, 1931 DOA: 10/10/2020 PCP: Delsa Grana, PA-C     Brief Narrative:  Veronica Colon is an 87 year old female with history of metastatic pancreatic adenocarcinoma, history of breast and colon cancer, type 2 diabetes, MGUS, chronic kidney disease presents with weakness and inability to walk.  She was also found to have a low sodium.  Pancytopenia with counts lowering over the past couple days.  New events last 24 hours / Subjective: Patient seen with daughter at bedside.  Patient states that she has no appetite.  She has no physical complaints, denies any abdominal pain or discomfort.  We discussed possible need for hospice as patient has had failure to thrive, poor oral intake, lack of improvement in sodium levels.  Palliative care medicine will see patient today  Assessment & Plan:   Active Problems:   H/O malignant neoplasm of breast   H/O malignant neoplasm of colon   CKD (chronic kidney disease), stage III (HCC)   Temporal arteritis (HCC)   Type 2 diabetes mellitus with obesity (HCC)   Monoclonal gammopathy of unknown significance (MGUS)   Primary pancreatic cancer with metastasis to other site [facility])   Dehydration   Generalized weakness   Metastatic adenocarcinoma to pancreas ([place])   Acute kidney injury superimposed on CKD (HCC)   Hyponatremia   Anemia associated with chemotherapy   Thrombocytopenia (HCC)   Hydronephrosis, left   Impaired fasting glucose   Pancytopenia ([place])   Thrush   Palliative care encounter   Metastasis from pancreatic cancer ([place])   Malnutrition of moderate degree   Hyponatremia -Patient was initially treated with IV fluid, then transition to IV albumin and Lasix.  However sodium continued to be low.  Started on tolvaptan, nephrology consulted -Per nephrology -Added salt tabs  Hypotension -Continue midodrine   Metastatic pancreatic adenocarcinoma -Appreciate oncology, palliative care  medicine -Oncology planning for chemo holiday, follow-up as outpatient 10/29/2020 -PET 09/2020 showed hypermetabolic left retroperitoneal and para-aortic lesions also pancreatic lesion and omental disease and ascites. Patient had declined paracentesis and treated with spironolactone  Pancytopenia secondary to chemotherapy -S/p Granix -Improved  AKI on CKD stage IIIb -Baseline Cr 1.5 -Has been ranging 1.5-1.79, continue to monitor closely   Failure to thrive, generalized weakness -Planning for SNF on discharge versus hospice  Acute urinary retention -Has required multiple in and out cath.  Foley was placed on 4/8    DVT prophylaxis: Lovenox on hold due to pancytopenia  Code Status: DNR Family Communication: Daughter at bedside Disposition Plan:  Status is: Inpatient  Remains inpatient appropriate because:Inpatient level of care appropriate due to severity of illness   Dispo: The patient is from: Home              Anticipated d/c is to: SNF              Patient currently is not medically stable to d/c.  Continue to treat hyponatremia, discussed with daughter regarding home hospice.  Daughter is considering.  Asked palliative care see patient today.   Difficult to place patient No      Consultants:   Palliative care medicine  Oncology  Nephrology     Antimicrobials:  Anti-infectives (From admission, onward)   None       Objective: Vitals:   10/19/20 1702 10/19/20 2031 10/20/20 0454 10/20/20 0751  BP: 132/74 136/70 (!) 142/70 (!) 131/116  Pulse: 80 77 66 76  Resp: 17 16 16  16  Temp: 98 F (36.7 C) 97.9 F (36.6 C) 97.7 F (36.5 C) 97.8 F (36.6 C)  TempSrc: Oral Oral Oral   SpO2: 98% 97% 98% 97%  Weight:      Height:        Intake/Output Summary (Last 24 hours) at 10/20/2020 1000 Last data filed at 10/19/2020 1930 Gross per 24 hour  Intake 480 ml  Output 225 ml  Net 255 ml   Filed Weights   10/10/20 1058 10/10/20 1715  Weight: 63.5 kg 72.7 kg     Examination: General exam: Appears calm and comfortable, frail Respiratory system: Clear to auscultation. Respiratory effort normal. Cardiovascular system: S1 & S2 heard, RRR.  Trace pedal edema. Gastrointestinal system: Abdomen is mildly distended, soft and nontender. Normal bowel sounds heard. Central nervous system: Alert and oriented. Non focal exam.  Extremities: Symmetric in appearance bilaterally  Skin: No rashes, lesions or ulcers on exposed skin  Psychiatry: Judgement and insight appear stable.   Data Reviewed: I have personally reviewed following labs and imaging studies  CBC: Recent Labs  Lab 10/16/20 0430 10/17/20 0548 10/18/20 0500 10/19/20 0537 10/20/20 0450  WBC 7.1 8.0 6.7 5.6 7.2  HGB 8.3* 8.0* 8.6* 9.1* 8.9*  HCT 23.5* 23.5* 24.7* 25.9* 26.5*  MCV 87.4 88.7 87.9 87.2 89.2  PLT 37* 45* 78* 140* 423   Basic Metabolic Panel: Recent Labs  Lab 10/14/20 0754 10/14/20 1047 10/16/20 0430 10/17/20 0548 10/18/20 0500 10/19/20 0537 10/20/20 0450  NA 124*   < > 126* 127* 122* 123* 122*  K 4.2   < > 4.4 4.4 4.4 4.8 5.0  CL 96*   < > 95* 97* 93* 94* 92*  CO2 18*   < > 21* 21* 20* 19* 20*  GLUCOSE 99   < > 120* 114* 111* 114* 126*  BUN 50*   < > 57* 56* 58* 58* 64*  CREATININE 1.71*   < > 1.71* 1.73* 1.68* 1.74* 1.88*  CALCIUM 8.7*   < > 8.7* 8.7* 8.6* 8.7* 8.5*  MG 1.9  --   --   --   --   --   --    < > = values in this interval not displayed.   GFR: Estimated Creatinine Clearance: 20.1 mL/min (A) (by C-G formula based on SCr of 1.88 mg/dL (H)). Liver Function Tests: Recent Labs  Lab 10/14/20 1047  AST 38  ALT 27  ALKPHOS 246*  BILITOT 2.3*  PROT 5.6*  ALBUMIN 2.7*   No results for input(s): LIPASE, AMYLASE in the last 168 hours. No results for input(s): AMMONIA in the last 168 hours. Coagulation Profile: No results for input(s): INR, PROTIME in the last 168 hours. Cardiac Enzymes: No results for input(s): CKTOTAL, CKMB, CKMBINDEX, TROPONINI  in the last 168 hours. BNP (last 3 results) No results for input(s): PROBNP in the last 8760 hours. HbA1C: No results for input(s): HGBA1C in the last 72 hours. CBG: No results for input(s): GLUCAP in the last 168 hours. Lipid Profile: No results for input(s): CHOL, HDL, LDLCALC, TRIG, CHOLHDL, LDLDIRECT in the last 72 hours. Thyroid Function Tests: No results for input(s): TSH, T4TOTAL, FREET4, T3FREE, THYROIDAB in the last 72 hours. Anemia Panel: No results for input(s): VITAMINB12, FOLATE, FERRITIN, TIBC, IRON, RETICCTPCT in the last 72 hours. Sepsis Labs: No results for input(s): PROCALCITON, LATICACIDVEN in the last 168 hours.  Recent Results (from the past 240 hour(s))  Resp Panel by RT-PCR (Flu A&B, Covid) Nasopharyngeal Swab  Status: None   Collection Time: 10/10/20  2:07 PM   Specimen: Nasopharyngeal Swab; Nasopharyngeal(NP) swabs in vial transport medium  Result Value Ref Range Status   SARS Coronavirus 2 by RT PCR NEGATIVE NEGATIVE Final    Comment: (NOTE) SARS-CoV-2 target nucleic acids are NOT DETECTED.  The SARS-CoV-2 RNA is generally detectable in upper respiratory specimens during the acute phase of infection. The lowest concentration of SARS-CoV-2 viral copies this assay can detect is 138 copies/mL. A negative result does not preclude SARS-Cov-2 infection and should not be used as the sole basis for treatment or other patient management decisions. A negative result may occur with  improper specimen collection/handling, submission of specimen other than nasopharyngeal swab, presence of viral mutation(s) within the areas targeted by this assay, and inadequate number of viral copies(<138 copies/mL). A negative result must be combined with clinical observations, patient history, and epidemiological information. The expected result is Negative.  Fact Sheet for Patients:  EntrepreneurPulse.com.au  Fact Sheet for Healthcare Providers:   IncredibleEmployment.be  This test is no t yet approved or cleared by the [place] FDA and  has been authorized for detection and/or diagnosis of SARS-CoV-2 by FDA under an Emergency Use Authorization (EUA). This EUA will remain  in effect (meaning this test can be used) for the duration of the COVID-19 declaration under Section 564(b)(1) of the Act, 21 U.S.C.section 360bbb-3(b)(1), unless the authorization is terminated  or revoked sooner.       Influenza A by PCR NEGATIVE NEGATIVE Final   Influenza B by PCR NEGATIVE NEGATIVE Final    Comment: (NOTE) The Xpert Xpress SARS-CoV-2/FLU/RSV plus assay is intended as an aid in the diagnosis of influenza from Nasopharyngeal swab specimens and should not be used as a sole basis for treatment. Nasal washings and aspirates are unacceptable for Xpert Xpress SARS-CoV-2/FLU/RSV testing.  Fact Sheet for Patients: EntrepreneurPulse.com.au  Fact Sheet for Healthcare Providers: IncredibleEmployment.be  This test is not yet approved or cleared by the [place] FDA and has been authorized for detection and/or diagnosis of SARS-CoV-2 by FDA under an Emergency Use Authorization (EUA). This EUA will remain in effect (meaning this test can be used) for the duration of the COVID-19 declaration under Section 564(b)(1) of the Act, 21 U.S.C. section 360bbb-3(b)(1), unless the authorization is terminated or revoked.  Performed at [facility], [place], [place] [address]   SARS CORONAVIRUS 2 (TAT 6-24 HRS) Nasopharyngeal     Status: None   Collection Time: 10/14/20  2:10 PM   Specimen: Nasopharyngeal  Result Value Ref Range Status   SARS Coronavirus 2 NEGATIVE NEGATIVE Final    Comment: (NOTE) SARS-CoV-2 target nucleic acids are NOT DETECTED.  The SARS-CoV-2 RNA is generally detectable in upper and lower respiratory specimens during the acute phase of  infection. Negative results do not preclude SARS-CoV-2 infection, do not rule out co-infections with other pathogens, and should not be used as the sole basis for treatment or other patient management decisions. Negative results must be combined with clinical observations, patient history, and epidemiological information. The expected result is Negative.  Fact Sheet for Patients: SugarRoll.be  Fact Sheet for Healthcare Providers: https://www.woods-mathews.com/  This test is not yet approved or cleared by the [place] FDA and  has been authorized for detection and/or diagnosis of SARS-CoV-2 by FDA under an Emergency Use Authorization (EUA). This EUA will remain  in effect (meaning this test can be used) for the duration of the COVID-19 declaration under Se  ction 564(b)(1) of the Act, 21 U.S.C. section 360bbb-3(b)(1), unless the authorization is terminated or revoked sooner.  Performed at [facility], [place] [address], [place], [place] [address]       Radiology Studies: No results found.    Scheduled Meds: . (feeding supplement) PROSource Plus  30 mL Oral BID BM  . ascorbic acid  1,000 mg Oral Once per day on Mon Wed Fri  . calcium-vitamin D  1 tablet Oral Daily  . Chlorhexidine Gluconate Cloth  6 each Topical Daily  . midodrine  5 mg Oral TID WC  . multivitamin with minerals  1 tablet Oral Daily  . nystatin  5 mL Oral QID  . sodium chloride flush  10-40 mL Intracatheter Q12H  . sodium chloride  1 g Oral BID WC  . spironolactone  50 mg Oral Daily   Continuous Infusions: . methocarbamol (ROBAXIN) IV       LOS: 10 days      Time spent: 25 minutes   Dessa Phi, DO [facility] 10/20/2020, 10:00 AM   Available via Epic secure chat 7am-7pm After these hours, please refer to coverage provider listed on [email or web address]

## 2020-10-20 NOTE — Progress Notes (Signed)
MEDICATION RELATED CONSULT NOTE - INITIAL Pharmacy Consult for tolvaptan [facility]) per pharmacy monitoring  Indication: low Na 122  Allergies  Allergen Reactions  . Hydrochlorothiazide Other (See Comments)    Hypokalemia   . Lisinopril Other (See Comments) and Cough    Hands numb   . Metformin And Related     Fatigue, "heart was beating slower"    Patient Measurements: Height: 5\' 3"  (160 cm) Weight: 72.7 kg (160 lb 4.4 oz) IBW/kg (Calculated) : 52.4 Adjusted Body Weight:    Vital Signs: Temp: 98.3 F (36.8 C) (04/11 1123) Temp Source: Oral (04/11 0454) BP: 133/75 (04/11 1123) Pulse Rate: 79 (04/11 1123) Intake/Output from previous day: 04/10 0701 - 04/11 0700 In: 480 [P.O.:480] Out: 225 [Urine:225] Intake/Output from this shift: Total I/O In: 220 [P.O.:220] Out: -   Labs: Recent Labs    10/18/20 0500 10/19/20 0537 10/20/20 0450  WBC 6.7 5.6 7.2  HGB 8.6* 9.1* 8.9*  HCT 24.7* 25.9* 26.5*  PLT 78* 140* 190  CREATININE 1.68* 1.74* 1.88*   Estimated Creatinine Clearance: 20.1 mL/min (A) (by C-G formula based on SCr of 1.88 mg/dL (H)).  Medical History: Past Medical History:  Diagnosis Date  . Allergy   . Anemia associated with chemotherapy   . Anxiety and depression   . Anxiety and depression   . Arthritis   . Breast cancer ([place]) 1990   LT MASTECTOMY  . Cancer ([place]) 1990   LT BREAST MASTECTOMY  . CKD (chronic kidney disease), stage III ([place])   . Diabetes mellitus without complication ([place])   . Family history of breast cancer   . Family history of pancreatic cancer   . GERD (gastroesophageal reflux disease)   . Hypertension   . Hypertensive CKD (chronic kidney disease)   . Personal history of chemotherapy   . Status post chemotherapy    breast cancer  . Temporal arteritis ([place]) 10/28/2016   Overview:  Right temporal arteritis  Biopsy proven 11/08/2016    Medications:  Scheduled:  . (feeding supplement) PROSource Plus  30 mL Oral BID BM  .  ascorbic acid  1,000 mg Oral Once per day on Mon Wed Fri  . calcium-vitamin D  1 tablet Oral Daily  . Chlorhexidine Gluconate Cloth  6 each Topical Daily  . midodrine  5 mg Oral TID WC  . multivitamin with minerals  1 tablet Oral Daily  . nystatin  5 mL Oral QID  . sodium chloride flush  10-40 mL Intracatheter Q12H  . sodium chloride  1 g Oral TID WC  . spironolactone  50 mg Oral Daily  . [START ON 10/21/2020] tolvaptan  15 mg Oral Q1200   Infusions:  . methocarbamol (ROBAXIN) IV      Assessment: 87 yo w/ Na 122.    Goal of Therapy: Na WNL  Plan:  4/5 0754 Na 124 4/5 1047 Na 122 (tolvaptan given at 1252) 4/5 1812 Na 123 4/6 0144 Na 125 4/7 0430 Na 126 4/8 0548 Na 127 4/9 0500 Na 122 4/10 0537 Na 123 4/11 0450 Na 122  Tolvaptan 15 mg po x 1 ordered.  Monitor and plan to Hold tolvaptan [facility]) and notify MD if sodium increases more than 8 mEq/L in 8 hours OR greater than 12 mEq/L in 24 hours.  Pearla Dubonnet, PharmD Clinical Pharmacist 10/20/2020 2:53 PM

## 2020-10-20 NOTE — Progress Notes (Signed)
[facility]  [phone] [phone]   Name: Veronica Colon Date: 10/20/2020 MRN: [id number]  DOB: 1932/01/21  Patient Care Team: Delsa Grana, PA-C as PCP - General (Family Medicine) Marlowe Sax, MD as Referring Physician (Internal Medicine) Birder Robson, MD as Referring Physician (Ophthalmology) Lloyd Huger, MD as Consulting Physician (Hematology and Oncology)    REASON FOR CONSULTATION: Veronica Colon is a 87 y.o. female with multiple medical problems including metastatic pancreatic cancer on systemic chemotherapy with gemcitabine, history of breast and colon cancer in remission, type 2 diabetes, MGUS, and CKD who was admitted on 10/10/2020 with progressive weakness.  Palliative care was consulted help address goals..   CODE STATUS: DNR  PAST MEDICAL HISTORY: Past Medical History:  Diagnosis Date  . Allergy   . Anemia associated with chemotherapy   . Anxiety and depression   . Anxiety and depression   . Arthritis   . Breast cancer ([place]) 1990   LT MASTECTOMY  . Cancer ([place]) 1990   LT BREAST MASTECTOMY  . CKD (chronic kidney disease), stage III ([place])   . Diabetes mellitus without complication ([place])   . Family history of breast cancer   . Family history of pancreatic cancer   . GERD (gastroesophageal reflux disease)   . Hypertension   . Hypertensive CKD (chronic kidney disease)   . Personal history of chemotherapy   . Status post chemotherapy    breast cancer  . Temporal arteritis ([place]) 10/28/2016   Overview:  Right temporal arteritis  Biopsy proven 11/08/2016    PAST SURGICAL HISTORY:  Past Surgical History:  Procedure Laterality Date  . ABDOMINAL HYSTERECTOMY    . BREAST SURGERY    . COLONOSCOPY WITH PROPOFOL N/A 06/04/2015   Procedure: COLONOSCOPY WITH PROPOFOL;  Surgeon: Manya Silvas, MD;  Location: [facility] ENDOSCOPY;  Service: Endoscopy;  Laterality: N/A;  . IR IMAGING  GUIDED PORT INSERTION  05/09/2020  . MASTECTOMY Left 1990   BREAST CA  . TEMPORAL ARTERY BIOPSY / LIGATION  11/2016    HEMATOLOGY/ONCOLOGY HISTORY:  Oncology History  Primary pancreatic cancer with metastasis to other site [facility])  05/01/2020 Initial Diagnosis   Pancreatic adenocarcinoma ([place])   05/12/2020 -  Chemotherapy    Patient is on Treatment Plan: PANCREAS GEMCITABINE D1,8 Q21D X 6 CYCLES      05/18/2020 Cancer Staging   Staging form: Exocrine Pancreas, AJCC 8th Edition - Clinical stage from 05/18/2020: Stage IV (cTX, cN2, pM1) - Signed by Lloyd Huger, MD on 05/18/2020     ALLERGIES:  is allergic to hydrochlorothiazide, lisinopril, and metformin and related.  MEDICATIONS:  Current Facility-Administered Medications  Medication Dose Route Frequency Provider Last Rate Last Admin  . (feeding supplement) PROSource Plus liquid 30 mL  30 mL Oral BID BM Dessa Phi, DO   30 mL at 10/20/20 1320  . acetaminophen (TYLENOL) tablet 650 mg  650 mg Oral Q6H PRN Clarnce Flock, MD       Or  . acetaminophen (TYLENOL) suppository 650 mg  650 mg Rectal Q6H PRN Clarnce Flock, MD      . ascorbic acid (VITAMIN C) tablet 1,000 mg  1,000 mg Oral Once per day on Mon Wed Fri Clarnce Flock, MD   1,000 mg at 10/20/20 0910  . calcium-vitamin D (OSCAL WITH D) 500-200 MG-UNIT per tablet 1 tablet  1 tablet Oral Daily Clarnce Flock, MD   1 tablet at 10/20/20 0910  .  Chlorhexidine Gluconate Cloth 2 % PADS 6 each  6 each Topical Daily Clarnce Flock, MD   6 each at 10/20/20 [phone]  . methocarbamol (ROBAXIN) 500 mg in dextrose 5 % 50 mL IVPB  500 mg Intravenous Q6H PRN Clarnce Flock, MD      . midodrine (PROAMATINE) tablet 5 mg  5 mg Oral TID WC Loletha Grayer, MD   5 mg at 10/20/20 1320  . multivitamin with minerals tablet 1 tablet  1 tablet Oral Daily Clarnce Flock, MD   1 tablet at 10/20/20 0910  . nystatin (MYCOSTATIN) 100000 UNIT/ML suspension 500,000 Units  5 mL  Oral QID Loletha Grayer, MD   500,000 Units at 10/20/20 1320  . ondansetron (ZOFRAN) tablet 4 mg  4 mg Oral Q6H PRN Clarnce Flock, MD       Or  . ondansetron [facility]) injection 4 mg  4 mg Intravenous Q6H PRN Clarnce Flock, MD      . oxyCODONE (Oxy IR/ROXICODONE) immediate release tablet 5 mg  5 mg Oral Q4H PRN Clarnce Flock, MD      . polyethylene glycol (MIRALAX / GLYCOLAX) packet 17 g  17 g Oral Daily PRN Loletha Grayer, MD      . prochlorperazine (COMPAZINE) tablet 10 mg  10 mg Oral Q6H PRN Clarnce Flock, MD      . sodium chloride flush (NS) 0.9 % injection 10-40 mL  10-40 mL Intracatheter Q12H Dessa Phi, DO   10 mL at 10/20/20 0921  . sodium chloride flush (NS) 0.9 % injection 10-40 mL  10-40 mL Intracatheter PRN Dessa Phi, DO   10 mL at 10/17/20 2058  . sodium chloride tablet 1 g  1 g Oral TID WC Breeze, Shantelle, NP      . spironolactone (ALDACTONE) tablet 50 mg  50 mg Oral Daily Clarnce Flock, MD   50 mg at 10/20/20 0910  . [START ON 10/21/2020] tolvaptan (SAMSCA) tablet 15 mg  15 mg Oral Q1200 [name], NP      . traZODone (DESYREL) tablet 25 mg  25 mg Oral QHS PRN Clarnce Flock, MD        VITAL SIGNS: BP 140/77 (BP Location: Right Arm)   Pulse 76   Temp 99 F (37.2 C)   Resp 16   Ht 5\' 3"  (1.6 m)   Wt 160 lb 4.4 oz (72.7 kg)   LMP  (LMP Unknown)   SpO2 98%   BMI 28.39 kg/m  Filed Weights   10/10/20 1058 10/10/20 1715  Weight: 140 lb (63.5 kg) 160 lb 4.4 oz (72.7 kg)    Estimated body mass index is 28.39 kg/m as calculated from the following:   Height as of this encounter: 5\' 3"  (1.6 m).   Weight as of this encounter: 160 lb 4.4 oz (72.7 kg).  LABS: CBC:    Component Value Date/Time   WBC 7.2 10/20/2020 0450   HGB 8.9 (L) 10/20/2020 0450   HGB 13.6 07/15/2017 1038   HCT 26.5 (L) 10/20/2020 0450   HCT 42.9 07/15/2017 1038   PLT 190 10/20/2020 0450   PLT 226 07/15/2017 1038   MCV 89.2 10/20/2020 0450   MCV 86  07/15/2017 1038   MCV 84 08/08/2014 1001   NEUTROABS 7.0 10/10/2020 1226   NEUTROABS 8.3 (H) 07/15/2017 1038   NEUTROABS 3.6 08/08/2014 1001   LYMPHSABS 0.3 (L) 10/10/2020 1226   LYMPHSABS 0.6 (L) 07/15/2017 1038   LYMPHSABS 1.4  08/08/2014 1001   MONOABS 0.1 10/10/2020 1226   MONOABS 0.5 08/08/2014 1001   EOSABS 0.0 10/10/2020 1226   EOSABS 0.0 07/15/2017 1038   EOSABS 0.1 08/08/2014 1001   BASOSABS 0.0 10/10/2020 1226   BASOSABS 0.0 07/15/2017 1038   BASOSABS 0.0 08/08/2014 1001   Comprehensive Metabolic Panel:    Component Value Date/Time   NA 122 (L) 10/20/2020 0450   NA 140 03/24/2020 1008   NA 142 11/04/2011 1005   K 5.0 10/20/2020 0450   K 3.3 (L) 11/04/2011 1005   CL 92 (L) 10/20/2020 0450   CL 101 11/04/2011 1005   CO2 20 (L) 10/20/2020 0450   CO2 29 11/04/2011 1005   BUN 64 (H) 10/20/2020 0450   BUN 21 03/24/2020 1008   BUN 17 11/04/2011 1005   CREATININE 1.88 (H) 10/20/2020 0450   CREATININE 1.74 (H) 01/07/2020 0938   GLUCOSE 126 (H) 10/20/2020 0450   GLUCOSE 135 (H) 11/04/2011 1005   CALCIUM 8.5 (L) 10/20/2020 0450   CALCIUM 9.3 11/04/2011 1005   AST 38 10/14/2020 1047   AST 16 11/04/2011 1005   ALT 27 10/14/2020 1047   ALT 23 11/04/2011 1005   ALKPHOS 246 (H) 10/14/2020 1047   ALKPHOS 98 11/04/2011 1005   BILITOT 2.3 (H) 10/14/2020 1047   BILITOT 0.4 01/16/2018 1059   BILITOT 0.2 11/04/2011 1005   PROT 5.6 (L) 10/14/2020 1047   PROT 6.3 01/16/2018 1059   PROT 7.5 11/04/2011 1005   ALBUMIN 2.7 (L) 10/14/2020 1047   ALBUMIN 4.0 01/16/2018 1059   ALBUMIN 3.6 11/04/2011 1005    RADIOGRAPHIC STUDIES: NM PET Image Restag (PS) Skull Base To Thigh  Result Date: 09/24/2020 CLINICAL DATA:  Aortic Atherosclerois (ICD10-170.0) treatment strategy for pancreatic cancer. EXAM: NUCLEAR MEDICINE PET SKULL BASE TO THIGH TECHNIQUE: 8.1 mCi F-18 FDG was injected intravenously. Full-ring PET imaging was performed from the skull base to thigh after the radiotracer. CT  data was obtained and used for attenuation correction and anatomic localization. Fasting blood glucose: 111 mg/dl COMPARISON:  04/17/2020. FINDINGS: Mediastinal blood pool activity: SUV max 3.1 Liver activity: SUV max NA NECK: No hypermetabolic lymph nodes in the neck. Incidental CT findings: none CHEST: No hypermetabolic mediastinal or hilar nodes. No suspicious pulmonary nodules on the CT scan. Incidental CT findings: Coronary artery calcification is evident. Atherosclerotic calcification is noted in the wall of the thoracic aorta. Right Port-A-Cath tip is positioned at the SVC/RA junction. ABDOMEN/PELVIS: Hypermetabolism seen in the pancreatic lesion previously has decreased slightly in the interval with SUV max = 5.2 today compared to 7.7 previously. The left para-aortic retroperitoneal lesion measures 1.7 x 1.6 cm today and shows low level hypermetabolism with SUV max = 3.8. This is lesion that measured 2.6 x 1.3 cm on previous PET-CT with SUV max = 6.6 and represents the site of left ureteral obstruction. Omental disease seen previously has decreased on CT imaging. Low level FDG accumulation identified in a 2.5 cm left paramidline omental lesion (165/3) with SUV max = 4.0 1.7 cm small midline omental lesion just deep to the umbilicus on 604/5 demonstrates SUV max = 3.8. Incidental CT findings: Left hydronephrosis is similar to prior. Moderate volume ascites is new in the interval. There is abdominal aortic atherosclerosis without aneurysm. Gallbladder is distended SKELETON: No focal hypermetabolic activity to suggest skeletal metastasis. Incidental CT findings: No worrisome lytic or sclerotic osseous abnormality. IMPRESSION: 1. Interval slight decrease in hypermetabolism associated with the patient's known pancreatic lesion. Main duct dilatation  in the tail of pancreas is similar to prior. 2. Hypermetabolic left retroperitoneal/para-aortic lesion measures slightly smaller today with associated interval  decrease in hypermetabolic FDG accumulation. This lesion obstructs the left ureter with similar appearance of left hydroureteronephrosis. 3. Omental disease identified on the previous study has decreased in the interval but persists. 4. Despite the generalized trend towards improvement on today's study, there is new moderate volume ascites in the abdomen and pelvis. Electronically Signed   By: Misty Stanley M.D.   On: 09/24/2020 15:40   DG Chest Portable 1 View  Result Date: 10/10/2020 CLINICAL DATA:  Weakness. EXAM: PORTABLE CHEST 1 VIEW COMPARISON:  August 07, 2018. FINDINGS: The heart size and mediastinal contours are within normal limits. No pneumothorax or significant pleural effusion is noted. Right internal jugular Port-A-Cath is noted with distal tip in expected position of cavoatrial junction. Hypoinflation of the lungs is noted with minimal bibasilar subsegmental atelectasis. The visualized skeletal structures are unremarkable. IMPRESSION: Hypoinflation of the lungs with minimal bibasilar subsegmental atelectasis. Electronically Signed   By: Marijo Conception M.D.   On: 10/10/2020 13:10    PERFORMANCE STATUS (ECOG) : 4 - Bedbound  Review of Systems Unable to complete  Physical Exam General: Ill-appearing, frail Pulmonary: Unlabored Extremities: no edema, no joint deformities Skin: no rashes Neurological: Wakes when stimulated  IMPRESSION: Follow-up visit.  Patient is without significant clinical improvement.  She remains hyponatremic with poor oral intake and severe weakness.  Family reports that she is sleeping more during the day.  Overall, she is failing to thrive and prognosis appears poor.  Case discussed with medical oncology and it is increasingly apparent that patient does not have a viable path to further cancer treatment.  I spoke with patient's daughter and granddaughter today regarding option for hospice involvement either at home or at a hospice IPU.  They were interested  in speaking with the hospice liaison for more information.  PLAN: -Recommend best supportive care and hospice either at home or hospice IPU  Case and plan discussed with Drs. Maylene Roes and Grayland Ormond  Time Total: 30 minutes  Visit consisted of counseling and education dealing with the complex and emotionally intense issues of symptom management and palliative care in the setting of serious and potentially life-threatening illness.Greater than 50%  of this time was spent counseling and coordinating care related to the above assessment and plan.  Signed by: Altha Harm, PhD, NP-C

## 2020-10-20 NOTE — Progress Notes (Signed)
[facility]) Hospital Liaison RN note:  Received requests from Altha Harm, NP for family interest in hospice involvement. Spoke with daughter, Dina Rich and granddaughter at bedside. Discussed differences in Hospice Home services versus care provided with hospice at home. Provided brochures related to hospice services. Family was appreciative and had no questions. They will discuss information provided today and [facility] Liaison will follow up in the am for decision.  Please call with any hospice related questions or concerns.  Thank you for the opportunity to participate in this patient's care.  Zandra Abts, RN [facility] Liaison  [phone]

## 2020-10-20 NOTE — Progress Notes (Addendum)
[facility]  PROGRESS NOTE   Subjective:   Patient seen resting in bed Daughter at bedside Alert and able to answer simple questions Was able to tolerate creamed potatoes for breakfast Continues to sip water Denies discomfort   Objective:  Vital signs in last 24 hours:  Temp:  [97.5 F (36.4 C)-99 F (37.2 C)] 98.4 F (36.9 C) (04/10 0814) Pulse Rate:  [72-77] 77 (04/10 0814) Resp:  [14-18] 18 (04/10 0814) BP: (130-135)/(69-72) 133/70 (04/10 0814) SpO2:  [96 %-98 %] 98 % (04/10 0814)  Weight change:  Filed Weights   10/10/20 1058 10/10/20 1715  Weight: 63.5 kg 72.7 kg    Intake/Output: I/O last 3 completed shifts: In: 75 [P.O.:420] Out: 900 [Urine:900]   Intake/Output this shift:  Total I/O In: 220 [P.O.:220] Out: -   Physical Exam: General:  No acute distress  Head:  Normocephalic, atraumatic. Moist oral mucosal membranes  Eyes:  Anicteric  Lungs:   Clear to auscultation, normal effort  Heart:  S1S2 no rubs  Abdomen:   Soft, nontender, bowel sounds present, mild distention  Extremities:  + peripheral edema.  Neurologic:  Awake, alert, following commands  Skin:  No lesions       Basic Metabolic Panel: Recent Labs  Lab 10/14/20 0754 10/14/20 1047 10/15/20 0803 10/16/20 0430 10/17/20 0548 10/18/20 0500 10/19/20 0537  NA 124*   < > 126* 126* 127* 122* 123*  K 4.2   < > 4.4 4.4 4.4 4.4 4.8  CL 96*   < > 96* 95* 97* 93* 94*  CO2 18*   < > 19* 21* 21* 20* 19*  GLUCOSE 99   < > 101* 120* 114* 111* 114*  BUN 50*   < > 56* 57* 56* 58* 58*  CREATININE 1.71*   < > 1.79* 1.71* 1.73* 1.68* 1.74*  CALCIUM 8.7*   < > 9.1 8.7* 8.7* 8.6* 8.7*  MG 1.9  --   --   --   --   --   --    < > = values in this interval not displayed.    Liver Function Tests: Recent Labs  Lab 10/14/20 1047  AST 38  ALT 27  ALKPHOS 246*  BILITOT 2.3*  PROT 5.6*  ALBUMIN 2.7*   No results for input(s): LIPASE, AMYLASE in the last 168 hours. No results for  input(s): AMMONIA in the last 168 hours.  CBC: Recent Labs  Lab 10/15/20 0144 10/16/20 0430 10/17/20 0548 10/18/20 0500 10/19/20 0537  WBC 3.3* 7.1 8.0 6.7 5.6  HGB 8.4* 8.3* 8.0* 8.6* 9.1*  HCT 24.5* 23.5* 23.5* 24.7* 25.9*  MCV 88.8 87.4 88.7 87.9 87.2  PLT 53* 37* 45* 78* 140*    Cardiac Enzymes: No results for input(s): CKTOTAL, CKMB, CKMBINDEX, TROPONINI in the last 168 hours.  BNP: Invalid input(s): POCBNP  CBG: No results for input(s): GLUCAP in the last 168 hours.  Microbiology: Results for orders placed or performed during the hospital encounter of 10/10/20  Resp Panel by RT-PCR (Flu A&B, Covid) Nasopharyngeal Swab     Status: None   Collection Time: 10/10/20  2:07 PM   Specimen: Nasopharyngeal Swab; Nasopharyngeal(NP) swabs in vial transport medium  Result Value Ref Range Status   SARS Coronavirus 2 by RT PCR NEGATIVE NEGATIVE Final    Comment: (NOTE) SARS-CoV-2 target nucleic acids are NOT DETECTED.  The SARS-CoV-2 RNA is generally detectable in upper respiratory specimens during the acute phase of infection. The lowest concentration of SARS-CoV-2  viral copies this assay can detect is 138 copies/mL. A negative result does not preclude SARS-Cov-2 infection and should not be used as the sole basis for treatment or other patient management decisions. A negative result may occur with  improper specimen collection/handling, submission of specimen other than nasopharyngeal swab, presence of viral mutation(s) within the areas targeted by this assay, and inadequate number of viral copies(<138 copies/mL). A negative result must be combined with clinical observations, patient history, and epidemiological information. The expected result is Negative.  Fact Sheet for Patients:  EntrepreneurPulse.com.au  Fact Sheet for Healthcare Providers:  IncredibleEmployment.be  This test is no t yet approved or cleared by the [place]  FDA and  has been authorized for detection and/or diagnosis of SARS-CoV-2 by FDA under an Emergency Use Authorization (EUA). This EUA will remain  in effect (meaning this test can be used) for the duration of the COVID-19 declaration under Section 564(b)(1) of the Act, 21 U.S.C.section 360bbb-3(b)(1), unless the authorization is terminated  or revoked sooner.       Influenza A by PCR NEGATIVE NEGATIVE Final   Influenza B by PCR NEGATIVE NEGATIVE Final    Comment: (NOTE) The Xpert Xpress SARS-CoV-2/FLU/RSV plus assay is intended as an aid in the diagnosis of influenza from Nasopharyngeal swab specimens and should not be used as a sole basis for treatment. Nasal washings and aspirates are unacceptable for Xpert Xpress SARS-CoV-2/FLU/RSV testing.  Fact Sheet for Patients: EntrepreneurPulse.com.au  Fact Sheet for Healthcare Providers: IncredibleEmployment.be  This test is not yet approved or cleared by the [place] FDA and has been authorized for detection and/or diagnosis of SARS-CoV-2 by FDA under an Emergency Use Authorization (EUA). This EUA will remain in effect (meaning this test can be used) for the duration of the COVID-19 declaration under Section 564(b)(1) of the Act, 21 U.S.C. section 360bbb-3(b)(1), unless the authorization is terminated or revoked.  Performed at [facility], [place], [place] [address]   SARS CORONAVIRUS 2 (TAT 6-24 HRS) Nasopharyngeal     Status: None   Collection Time: 10/14/20  2:10 PM   Specimen: Nasopharyngeal  Result Value Ref Range Status   SARS Coronavirus 2 NEGATIVE NEGATIVE Final    Comment: (NOTE) SARS-CoV-2 target nucleic acids are NOT DETECTED.  The SARS-CoV-2 RNA is generally detectable in upper and lower respiratory specimens during the acute phase of infection. Negative results do not preclude SARS-CoV-2 infection, do not rule out co-infections with other  pathogens, and should not be used as the sole basis for treatment or other patient management decisions. Negative results must be combined with clinical observations, patient history, and epidemiological information. The expected result is Negative.  Fact Sheet for Patients: SugarRoll.be  Fact Sheet for Healthcare Providers: https://www.woods-mathews.com/  This test is not yet approved or cleared by the [place] FDA and  has been authorized for detection and/or diagnosis of SARS-CoV-2 by FDA under an Emergency Use Authorization (EUA). This EUA will remain  in effect (meaning this test can be used) for the duration of the COVID-19 declaration under Se ction 564(b)(1) of the Act, 21 U.S.C. section 360bbb-3(b)(1), unless the authorization is terminated or revoked sooner.  Performed at [facility], [place] [address] [place], [place] [address]     Coagulation Studies: No results for input(s): LABPROT, INR in the last 72 hours.  Urinalysis: No results for input(s): COLORURINE, LABSPEC, PHURINE, GLUCOSEU, HGBUR, BILIRUBINUR, KETONESUR, PROTEINUR, UROBILINOGEN, NITRITE, LEUKOCYTESUR in the last 72 hours.  Invalid  input(s): APPERANCEUR    Imaging: No results found.   Medications:   . methocarbamol (ROBAXIN) IV     . (feeding supplement) PROSource Plus  30 mL Oral BID BM  . ascorbic acid  1,000 mg Oral Once per day on Mon Wed Fri  . calcium-vitamin D  1 tablet Oral Daily  . Chlorhexidine Gluconate Cloth  6 each Topical Daily  . midodrine  5 mg Oral TID WC  . multivitamin with minerals  1 tablet Oral Daily  . nystatin  5 mL Oral QID  . sodium chloride flush  10-40 mL Intracatheter Q12H  . spironolactone  50 mg Oral Daily    Assessment/ Plan:     Active Problems:   H/O malignant neoplasm of breast   H/O malignant neoplasm of colon   CKD (chronic kidney disease), stage III (HCC)   Temporal arteritis (HCC)   Type 2  diabetes mellitus with obesity (HCC)   Monoclonal gammopathy of unknown significance (MGUS)   Primary pancreatic cancer with metastasis to other site [facility])   Dehydration   Generalized weakness   Metastatic adenocarcinoma to pancreas ([place])   Acute kidney injury superimposed on CKD (HCC)   Hyponatremia   Anemia associated with chemotherapy   Thrombocytopenia (HCC)   Hydronephrosis, left   Impaired fasting glucose   Pancytopenia ([place])   Thrush   Palliative care encounter   Metastasis from pancreatic cancer ([place])   Malnutrition of moderate degree  87 y.o. female with history of metastatic pancreatic carcinoma, history of breast and colon cancer, diabetes, MGUS, chronic kidney disease now admitted with history of failure to thrive.  As per patient's daughter her general status has declined since initiation of the chemotherapy.  1. Hyponatremia Current level 122 Restart Tolvaptan 72m daily Limit fluid intake to 1.2L Increase salt tabs to TID Monitor sodium levels  2.Acute Kidney injury on chronic kidney disease stage 3b with baseline creatinine 1.51/EGFR 36 on 03/21/20 Secondary to chemo induced loss of appetite Renal function stablilizing Encourage oral intake  3. Diabetes melitis with CKD - stable at this time  We will continue to follow during hospitalization.   LOS: [facility] 4/11/20222:08 PM

## 2020-10-21 DIAGNOSIS — E871 Hypo-osmolality and hyponatremia: Secondary | ICD-10-CM | POA: Diagnosis not present

## 2020-10-21 LAB — BASIC METABOLIC PANEL
Anion gap: 11 (ref 5–15)
BUN: 73 mg/dL — ABNORMAL HIGH (ref 8–23)
CO2: 19 mmol/L — ABNORMAL LOW (ref 22–32)
Calcium: 8.4 mg/dL — ABNORMAL LOW (ref 8.9–10.3)
Chloride: 93 mmol/L — ABNORMAL LOW (ref 98–111)
Creatinine, Ser: 2.02 mg/dL — ABNORMAL HIGH (ref 0.44–1.00)
GFR, Estimated: 23 mL/min — ABNORMAL LOW (ref >60)
Glucose, Bld: 135 mg/dL — ABNORMAL HIGH (ref 70–99)
Potassium: 5.2 mmol/L — ABNORMAL HIGH (ref 3.5–5.1)
Sodium: 123 mmol/L — ABNORMAL LOW (ref 135–145)

## 2020-10-21 LAB — CBC
HCT: 25.9 % — ABNORMAL LOW (ref 36.0–46.0)
Hemoglobin: 9.2 g/dL — ABNORMAL LOW (ref 12.0–15.0)
MCH: 31.3 pg (ref 26.0–34.0)
MCHC: 35.5 g/dL (ref 30.0–36.0)
MCV: 88.1 fL (ref 80.0–100.0)
Platelets: 242 10*3/uL (ref 150–400)
RBC: 2.94 MIL/uL — ABNORMAL LOW (ref 3.87–5.11)
RDW: 18.9 % — ABNORMAL HIGH (ref 11.5–15.5)
WBC: 9.9 10*3/uL (ref 4.0–10.5)
nRBC: 51.4 % — ABNORMAL HIGH (ref 0.0–0.2)

## 2020-10-21 LAB — HEPATIC FUNCTION PANEL
ALT: 26 U/L (ref 0–44)
AST: 67 U/L — ABNORMAL HIGH (ref 15–41)
Albumin: 2.4 g/dL — ABNORMAL LOW (ref 3.5–5.0)
Alkaline Phosphatase: 258 U/L — ABNORMAL HIGH (ref 38–126)
Bilirubin, Direct: 1.2 mg/dL — ABNORMAL HIGH (ref 0.0–0.2)
Indirect Bilirubin: 1.3 mg/dL — ABNORMAL HIGH (ref 0.3–0.9)
Total Bilirubin: 2.5 mg/dL — ABNORMAL HIGH (ref 0.3–1.2)
Total Protein: 5.4 g/dL — ABNORMAL LOW (ref 6.5–8.1)

## 2020-10-21 LAB — GLUCOSE, CAPILLARY: Glucose-Capillary: 157 mg/dL — ABNORMAL HIGH (ref 70–99)

## 2020-10-21 LAB — SODIUM
Sodium: 122 mmol/L — ABNORMAL LOW (ref 135–145)
Sodium: 122 mmol/L — ABNORMAL LOW (ref 135–145)
Sodium: 122 mmol/L — ABNORMAL LOW (ref 135–145)

## 2020-10-21 LAB — PATHOLOGIST SMEAR REVIEW

## 2020-10-21 NOTE — Progress Notes (Signed)
PT Cancellation Note  Patient Details Name: Veronica Colon MRN: [id number] DOB: 01-21-1932   Cancelled Treatment:    Reason Eval/Treat Not Completed: Other (comment).  Chart reviewed.  Therapist checked in on pt this afternoon--pt resting in bed and did not open her eyes while therapist was in the room.  Pt's daughter present and reporting OT attempted to see pt earlier today but pt declined mobility.  Per discussion with pt's daughter, therapist deferred session.  Per pt's nurse, plan for pt to discharge home with hospice tomorrow.    Leitha Bleak, PT 10/21/20, 4:22 PM

## 2020-10-21 NOTE — Progress Notes (Signed)
MEDICATION RELATED CONSULT NOTE - INITIAL Pharmacy Consult for tolvaptan [facility]) per pharmacy monitoring  Indication: low Na 122  Allergies  Allergen Reactions  . Hydrochlorothiazide Other (See Comments)    Hypokalemia   . Lisinopril Other (See Comments) and Cough    Hands numb   . Metformin And Related     Fatigue, "heart was beating slower"    Patient Measurements: Height: 5\' 3"  (160 cm) Weight: 72.7 kg (160 lb 4.4 oz) IBW/kg (Calculated) : 52.4 Adjusted Body Weight:    Vital Signs: Temp: 98.1 F (36.7 C) (04/12 1130) Temp Source: Oral (04/12 0500) BP: 136/79 (04/12 1130) Pulse Rate: 76 (04/12 1130) Intake/Output from previous day: 04/11 0701 - 04/12 0700 In: 460 [P.O.:460] Out: 400 [Urine:400] Intake/Output from this shift: Total I/O In: 240 [P.O.:240] Out: 175 [Urine:175]  Labs: Recent Labs    10/19/20 0537 10/20/20 0450 10/21/20 0540  WBC 5.6 7.2 9.9  HGB 9.1* 8.9* 9.2*  HCT 25.9* 26.5* 25.9*  PLT 140* 190 242  CREATININE 1.74* 1.88* 2.02*  ALBUMIN  --   --  2.4*  PROT  --   --  5.4*  AST  --   --  67*  ALT  --   --  26  ALKPHOS  --   --  258*  BILITOT  --   --  2.5*  BILIDIR  --   --  1.2*  IBILI  --   --  1.3*   Estimated Creatinine Clearance: 18.7 mL/min (A) (by C-G formula based on SCr of 2.02 mg/dL (H)).  Medical History: Past Medical History:  Diagnosis Date  . Allergy   . Anemia associated with chemotherapy   . Anxiety and depression   . Anxiety and depression   . Arthritis   . Breast cancer ([place]) 1990   LT MASTECTOMY  . Cancer ([place]) 1990   LT BREAST MASTECTOMY  . CKD (chronic kidney disease), stage III ([place])   . Diabetes mellitus without complication ([place])   . Family history of breast cancer   . Family history of pancreatic cancer   . GERD (gastroesophageal reflux disease)   . Hypertension   . Hypertensive CKD (chronic kidney disease)   . Personal history of chemotherapy   . Status post chemotherapy    breast cancer  .  Temporal arteritis ([place]) 10/28/2016   Overview:  Right temporal arteritis  Biopsy proven 11/08/2016    Medications:  Scheduled:  . (feeding supplement) PROSource Plus  30 mL Oral BID BM  . ascorbic acid  1,000 mg Oral Once per day on Mon Wed Fri  . calcium-vitamin D  1 tablet Oral Daily  . Chlorhexidine Gluconate Cloth  6 each Topical Daily  . midodrine  5 mg Oral TID WC  . multivitamin with minerals  1 tablet Oral Daily  . nystatin  5 mL Oral QID  . sodium chloride flush  10-40 mL Intracatheter Q12H  . sodium chloride  1 g Oral TID WC  . spironolactone  50 mg Oral Daily  . tolvaptan  15 mg Oral Q1200   Infusions:  . methocarbamol (ROBAXIN) IV      Assessment: 87 yo w/ Na 123.    Goal of Therapy: Na WNL  Plan:  4/5 0754 Na 124 4/5 1047 Na 122 (tolvaptan given at 1252) 4/5 1812 Na 123 4/6 0144 Na 125 4/7 0430 Na 126 4/8 0548 Na 127 4/9 0500 Na 122 4/10 0537 Na 123 4/11 0450 Na 122 4/12 0540 Na  123 (tolvaptan given at 1301)  Tolvaptan 15 mg po daily ordered by Nephrology.  Monitor and plan to Hold tolvaptan [facility]) and notify MD if sodium increases more than 8 mEq/L in 8 hours OR greater than 12 mEq/L in 24 hours.  Pearla Dubonnet, PharmD Clinical Pharmacist 10/21/2020 2:45 PM

## 2020-10-21 NOTE — Progress Notes (Signed)
Nutrition Follow-up  DOCUMENTATION CODES:   Non-severe (moderate) malnutrition in context of chronic illness  INTERVENTION:   -Continue 30 ml Prosource Plus BID, each supplement provides 100 kcals and 15 grams protein -Continue MVI with minerals daily -Continue liberalized diet of regular  NUTRITION DIAGNOSIS:   Moderate Malnutrition related to chronic illness (metasatic pancreatic cancer) as evidenced by mild fat depletion,moderate fat depletion,mild muscle depletion,moderate muscle depletion,energy intake < or equal to 75% for > or equal to 1 month.  Ongoing  GOAL:   Patient will meet greater than or equal to 90% of their needs  Progressing   MONITOR:   PO intake,Supplement acceptance,Diet advancement,Labs,Weight trends,Skin,I & O's  REASON FOR ASSESSMENT:   Consult Assessment of nutrition requirement/status  ASSESSMENT:   Veronica Colon is a 87 y.o. female with hx of metastatic pancreatic cancer currently undergoing palliative chemotherapy, history of breast and colon cancer in remission, type 2 diabetes, MGUS, CKD, who presents with profound weakness.  Reviewed I/O's: +60 ml x 24 hours and -4.4 L since admission  Po 25-40%  Per MD notes, pt continues to decline. Palliative care has met with pt and family. Hospice liaison reports that pt plans to discharge home tomorrow with hospice services.   Pt remains with poor oral intake. Meal completions 25-40%. Pt remains on a regular diet for widest variety of meal selections. She has taken the last 3 Prosource administrations per [facility].   Medications reviewed and include vitamin C, calcium-vitamin D, and aldactone.   Labs reviewed: Na: 122.   Diet Order:   Diet Order            Diet regular Room service appropriate? Yes; Fluid consistency: Thin  Diet effective now                 EDUCATION NEEDS:   Education needs have been addressed  Skin:  Skin Assessment: Reviewed RN Assessment  Last BM:  10/17/20  Height:    Ht Readings from Last 1 Encounters:  10/10/20 _0  (1.6 m)    Weight:   Wt Readings from Last 1 Encounters:  10/10/20 72.7 kg    Ideal Body Weight:  52.3 kg  BMI:  Body mass index is 28.39 kg/m.  Estimated Nutritional Needs:   Kcal:  1650-1850  Protein:  80-95 grams  Fluid:  > 1.6 L    Loistine Chance, RD, LDN, [place] Registered Dietitian II Certified Diabetes Care and Education Specialist Please refer to [facility] for RD and/or RD on-call/weekend/after hours pager

## 2020-10-21 NOTE — Progress Notes (Signed)
Occupational Therapy Treatment Patient Details Name: Veronica Colon MRN: [id number] DOB: 03/04/1932 Today's Date: 10/21/2020    History of present illness Pt is an 87 y.o. female with hx of metastatic pancreatic cancer currently undergoing palliative chemotherapy, history of breast and colon cancer in remission, type 2 diabetes, MGUS, CKD, who presents with profound weakness.  MD assessment includes: physical deconditioning, metastatic pancreatic cancer, AKI, elevated troponin, hyponatremia, and anemia.   OT comments  Veronica Colon was seen for OT treatment on this date. Upon arrival to room pt reclined in bed, family at bed side. Pt defers mobility session, family agreeable to education prior to planned d/c home tmrw. Family return demonstrated proper body mechanics for bed level ADLs and sliding pt higher in bed. Pt deferred further mobility trials, plan to demo sup<>sit next session. HEP reviewed with pt and family, plan to deliver theraband next session. Pt requires MAX A for LBD at bed level and +2 for sliding higher in bed. All family questions addressed, and DME instruction completed. Pt making progress toward goals. Pt continues to benefit from skilled OT services to maximize return to PLOF and minimize risk of future falls, injury, caregiver burden, and readmission. Will continue to follow POC. Discharge recommendation remains appropriate.    Follow Up Recommendations  SNF    Equipment Recommendations  3 in 1 bedside commode    Recommendations for Other Services      Precautions / Restrictions Precautions Precautions: Fall Precaution Comments: R chest port Restrictions Weight Bearing Restrictions: No       Mobility Bed Mobility Overal bed mobility: Needs Assistance             General bed mobility comments: MOD A sup<>long sitting                     ADL either performed or assessed with clinical judgement   ADL Overall ADL's : Needs assistance/impaired                                        General ADL Comments: MAX A for LBD at bed level               Cognition Arousal/Alertness: Awake/alert Behavior During Therapy: Flat affect Overall Cognitive Status: Within Functional Limits for tasks assessed                                          Exercises Exercises: Other exercises Other Exercises Other Exercises: Pt and family educated re: DME recs, d/c recs, falls prevention, ECS, HEP, home/routines modifications, caregiver burnout Other Exercises: Family return demonstrated proper body mechanics for bed level ADLs and sliding pt higher in bed. Pt deferred furhter mobility trials, plan to demo next session           Pertinent Vitals/ Pain       Pain Assessment: No/denies pain     Prior Functioning/Environment              Frequency  Min 1X/week        Progress Toward Goals  OT Goals(current goals can now be found in the care plan section)  Progress towards OT goals: Progressing toward goals  Acute Rehab OT Goals Patient Stated Goal: to get stronger and do more for myself OT Goal  Formulation: With patient Time For Goal Achievement: 10/25/20 Potential to Achieve Goals: Fair ADL Goals Pt Will Perform Lower Body Dressing: with set-up;with min assist Pt Will Transfer to Toilet: with min guard assist Additional ADL Goal #1: Pt to increase bilateral UE strength by 1 mm grade to assist with completing ADL tasks  Plan Discharge plan remains appropriate;Frequency remains appropriate       AM-PAC OT "6 Clicks" Daily Activity     Outcome Measure   Help from another person eating meals?: None Help from another person taking care of personal grooming?: A Little Help from another person toileting, which includes using toliet, bedpan, or urinal?: A Lot Help from another person bathing (including washing, rinsing, drying)?: A Lot Help from another person to put on and taking off regular upper body  clothing?: A Little Help from another person to put on and taking off regular lower body clothing?: A Lot 6 Click Score: 16    End of Session    OT Visit Diagnosis: Unsteadiness on feet (R26.81);Muscle weakness (generalized) (M62.81)   Activity Tolerance Patient limited by fatigue   Patient Left in bed;with call bell/phone within reach;with family/visitor present   Nurse Communication          Time: 1583-0940 OT Time Calculation (min): 24 min  Charges: OT General Charges $OT Visit: 1 Visit OT Treatments $Self Care/Home Management : 23-37 mins  Veronica Colon, M.S. OTR/L  10/21/20, 4:02 PM  ascom [phone]

## 2020-10-21 NOTE — Progress Notes (Signed)
[facility]  PROGRESS NOTE   Subjective:   Patient seen resting in bed Alert and able to answer simple questions Says she doesn't feel well today   Objective:  Vital signs in last 24 hours:  Temp:  [97.5 F (36.4 C)-99 F (37.2 C)] 98.4 F (36.9 C) (04/10 0814) Pulse Rate:  [72-77] 77 (04/10 0814) Resp:  [14-18] 18 (04/10 0814) BP: (130-135)/(69-72) 133/70 (04/10 0814) SpO2:  [96 %-98 %] 98 % (04/10 0814)  Weight change:  Filed Weights   10/10/20 1058 10/10/20 1715  Weight: 63.5 kg 72.7 kg    Intake/Output: I/O last 3 completed shifts: In: 61 [P.O.:420] Out: 900 [Urine:900]   Intake/Output this shift:  Total I/O In: 240 [P.O.:240] Out: 175 [Urine:175]  Physical Exam: General:  No acute distress  Head:  Normocephalic, atraumatic. Moist oral mucosal membranes  Eyes:  Anicteric  Lungs:   Clear to auscultation, normal effort  Heart:  S1S2 no rubs  Abdomen:   Soft, nontender, bowel sounds present, mild distention  Extremities:  + peripheral edema.  Neurologic:  Awake, alert, following commands  Skin:  No lesions       Basic Metabolic Panel: Recent Labs  Lab 10/14/20 0754 10/14/20 1047 10/15/20 0803 10/16/20 0430 10/17/20 0548 10/18/20 0500 10/19/20 0537  NA 124*   < > 126* 126* 127* 122* 123*  K 4.2   < > 4.4 4.4 4.4 4.4 4.8  CL 96*   < > 96* 95* 97* 93* 94*  CO2 18*   < > 19* 21* 21* 20* 19*  GLUCOSE 99   < > 101* 120* 114* 111* 114*  BUN 50*   < > 56* 57* 56* 58* 58*  CREATININE 1.71*   < > 1.79* 1.71* 1.73* 1.68* 1.74*  CALCIUM 8.7*   < > 9.1 8.7* 8.7* 8.6* 8.7*  MG 1.9  --   --   --   --   --   --    < > = values in this interval not displayed.    Liver Function Tests: Recent Labs  Lab 10/14/20 1047  AST 38  ALT 27  ALKPHOS 246*  BILITOT 2.3*  PROT 5.6*  ALBUMIN 2.7*   No results for input(s): LIPASE, AMYLASE in the last 168 hours. No results for input(s): AMMONIA in the last 168 hours.  CBC: Recent Labs  Lab  10/15/20 0144 10/16/20 0430 10/17/20 0548 10/18/20 0500 10/19/20 0537  WBC 3.3* 7.1 8.0 6.7 5.6  HGB 8.4* 8.3* 8.0* 8.6* 9.1*  HCT 24.5* 23.5* 23.5* 24.7* 25.9*  MCV 88.8 87.4 88.7 87.9 87.2  PLT 53* 37* 45* 78* 140*    Cardiac Enzymes: No results for input(s): CKTOTAL, CKMB, CKMBINDEX, TROPONINI in the last 168 hours.  BNP: Invalid input(s): POCBNP  CBG: No results for input(s): GLUCAP in the last 168 hours.  Microbiology: Results for orders placed or performed during the hospital encounter of 10/10/20  Resp Panel by RT-PCR (Flu A&B, Covid) Nasopharyngeal Swab     Status: None   Collection Time: 10/10/20  2:07 PM   Specimen: Nasopharyngeal Swab; Nasopharyngeal(NP) swabs in vial transport medium  Result Value Ref Range Status   SARS Coronavirus 2 by RT PCR NEGATIVE NEGATIVE Final    Comment: (NOTE) SARS-CoV-2 target nucleic acids are NOT DETECTED.  The SARS-CoV-2 RNA is generally detectable in upper respiratory specimens during the acute phase of infection. The lowest concentration of SARS-CoV-2 viral copies this assay can detect is 138 copies/mL. A negative  result does not preclude SARS-Cov-2 infection and should not be used as the sole basis for treatment or other patient management decisions. A negative result may occur with  improper specimen collection/handling, submission of specimen other than nasopharyngeal swab, presence of viral mutation(s) within the areas targeted by this assay, and inadequate number of viral copies(<138 copies/mL). A negative result must be combined with clinical observations, patient history, and epidemiological information. The expected result is Negative.  Fact Sheet for Patients:  EntrepreneurPulse.com.au  Fact Sheet for Healthcare Providers:  IncredibleEmployment.be  This test is no t yet approved or cleared by the [place] FDA and  has been authorized for detection and/or diagnosis of  SARS-CoV-2 by FDA under an Emergency Use Authorization (EUA). This EUA will remain  in effect (meaning this test can be used) for the duration of the COVID-19 declaration under Section 564(b)(1) of the Act, 21 U.S.C.section 360bbb-3(b)(1), unless the authorization is terminated  or revoked sooner.       Influenza A by PCR NEGATIVE NEGATIVE Final   Influenza B by PCR NEGATIVE NEGATIVE Final    Comment: (NOTE) The Xpert Xpress SARS-CoV-2/FLU/RSV plus assay is intended as an aid in the diagnosis of influenza from Nasopharyngeal swab specimens and should not be used as a sole basis for treatment. Nasal washings and aspirates are unacceptable for Xpert Xpress SARS-CoV-2/FLU/RSV testing.  Fact Sheet for Patients: EntrepreneurPulse.com.au  Fact Sheet for Healthcare Providers: IncredibleEmployment.be  This test is not yet approved or cleared by the [place] FDA and has been authorized for detection and/or diagnosis of SARS-CoV-2 by FDA under an Emergency Use Authorization (EUA). This EUA will remain in effect (meaning this test can be used) for the duration of the COVID-19 declaration under Section 564(b)(1) of the Act, 21 U.S.C. section 360bbb-3(b)(1), unless the authorization is terminated or revoked.  Performed at [facility], [place], [place] [address]   SARS CORONAVIRUS 2 (TAT 6-24 HRS) Nasopharyngeal     Status: None   Collection Time: 10/14/20  2:10 PM   Specimen: Nasopharyngeal  Result Value Ref Range Status   SARS Coronavirus 2 NEGATIVE NEGATIVE Final    Comment: (NOTE) SARS-CoV-2 target nucleic acids are NOT DETECTED.  The SARS-CoV-2 RNA is generally detectable in upper and lower respiratory specimens during the acute phase of infection. Negative results do not preclude SARS-CoV-2 infection, do not rule out co-infections with other pathogens, and should not be used as the sole basis for treatment or  other patient management decisions. Negative results must be combined with clinical observations, patient history, and epidemiological information. The expected result is Negative.  Fact Sheet for Patients: SugarRoll.be  Fact Sheet for Healthcare Providers: https://www.woods-mathews.com/  This test is not yet approved or cleared by the [place] FDA and  has been authorized for detection and/or diagnosis of SARS-CoV-2 by FDA under an Emergency Use Authorization (EUA). This EUA will remain  in effect (meaning this test can be used) for the duration of the COVID-19 declaration under Se ction 564(b)(1) of the Act, 21 U.S.C. section 360bbb-3(b)(1), unless the authorization is terminated or revoked sooner.  Performed at [facility], [place] [address] [place], [place] [address]     Coagulation Studies: No results for input(s): LABPROT, INR in the last 72 hours.  Urinalysis: No results for input(s): COLORURINE, LABSPEC, PHURINE, GLUCOSEU, HGBUR, BILIRUBINUR, KETONESUR, PROTEINUR, UROBILINOGEN, NITRITE, LEUKOCYTESUR in the last 72 hours.  Invalid input(s): APPERANCEUR    Imaging: No results found.  Medications:   . methocarbamol (ROBAXIN) IV     . (feeding supplement) PROSource Plus  30 mL Oral BID BM  . ascorbic acid  1,000 mg Oral Once per day on Mon Wed Fri  . calcium-vitamin D  1 tablet Oral Daily  . Chlorhexidine Gluconate Cloth  6 each Topical Daily  . midodrine  5 mg Oral TID WC  . multivitamin with minerals  1 tablet Oral Daily  . nystatin  5 mL Oral QID  . sodium chloride flush  10-40 mL Intracatheter Q12H  . spironolactone  50 mg Oral Daily    Assessment/ Plan:     Active Problems:   H/O malignant neoplasm of breast   H/O malignant neoplasm of colon   CKD (chronic kidney disease), stage III (HCC)   Temporal arteritis (HCC)   Type 2 diabetes mellitus with obesity (HCC)   Monoclonal gammopathy of unknown  significance (MGUS)   Primary pancreatic cancer with metastasis to other site [facility])   Dehydration   Generalized weakness   Metastatic adenocarcinoma to pancreas ([place])   Acute kidney injury superimposed on CKD (HCC)   Hyponatremia   Anemia associated with chemotherapy   Thrombocytopenia (HCC)   Hydronephrosis, left   Impaired fasting glucose   Pancytopenia ([place])   Thrush   Palliative care encounter   Metastasis from pancreatic cancer ([place])   Malnutrition of moderate degree  87 y.o. female with history of metastatic pancreatic carcinoma, history of breast and colon cancer, diabetes, MGUS, chronic kidney disease now admitted with history of failure to thrive.  As per patient's daughter her general status has declined since initiation of the chemotherapy.  1. Hyponatremia Current level 123 Tolvaptan and Spironolactone Fluid restriction 1.2L Increase salt tabs to TID Monitor sodium levels  2.Acute Kidney injury on chronic kidney disease stage 3b with baseline creatinine 1.51/EGFR 36 on 03/21/20 Secondary to chemo induced loss of appetite Increased creatinine today 2.02 Continues to have poor appetite and minimal fluid intake Continuing to monitor renal function  3. Diabetes melitis with CKD - Glucose controlled  We will continue to follow during hospitalization.   LOS: [facility] 4/12/20221:53 PM

## 2020-10-21 NOTE — Progress Notes (Signed)
[place] Room [facility]) Hospital Liaison RN note:  Received request from Altha Harm, NP for hospice services at home after discharge. Chart and patient information has been reviewed and hospice eligibility was approved.  Spoke with daughter, Dina Rich again to confirm that family has chosen to take patient home with support of hospice. They will not be able to receive DME and patient until tomorrow. Hospital care team is aware.  DME needs discussed. Family requests hospital bed and over the bed table to be delivered. Address and contact information has been verified.  Please send signed and completed DNR home with patient. Please provide prescriptions at discharge as needed to ensure ongoing symptom management.  Please call with any hospice related questions or concerns.  Thank you for the opportunity to participate in this patient's care.  Zandra Abts, RN [facility] Liaison [phone]

## 2020-10-21 NOTE — Progress Notes (Signed)
PROGRESS NOTE    Veronica Colon  OXB:[id number] DOB: 09/21/1931 DOA: 10/10/2020 PCP: Delsa Grana, PA-C     Brief Narrative:  Veronica Colon is an 87 year old female with history of metastatic pancreatic adenocarcinoma, history of breast and colon cancer, type 2 diabetes, MGUS, chronic kidney disease presents with weakness and inability to walk.  She was also found to have a low sodium.  Pancytopenia with counts lowering over the past couple days.  Hospitalization complicated by frailty, poor oral intake and hyponatremia.  New events last 24 hours / Subjective: No family is at bedside this morning.  Patient has no physical complaints, states that she feels about the same.  I tried to elicit her wishes going forward regarding hospice but she did not readily engage in conversation today.  Per discussion with palliative care medicine, family is deciding between residential versus home hospice at this time.  Assessment & Plan:   Principal Problem:   Hyponatremia Active Problems:   H/O malignant neoplasm of breast   H/O malignant neoplasm of colon   CKD (chronic kidney disease), stage III (HCC)   Temporal arteritis (HCC)   Type 2 diabetes mellitus with obesity (HCC)   Monoclonal gammopathy of unknown significance (MGUS)   Primary pancreatic cancer with metastasis to other site [facility])   Dehydration   Generalized weakness   Metastatic adenocarcinoma to pancreas ([place])   Acute kidney injury superimposed on CKD ([place])   Anemia associated with chemotherapy   Thrombocytopenia (HCC)   Hydronephrosis, left   Impaired fasting glucose   Pancytopenia ([place])   Thrush   Palliative care encounter   Metastasis from pancreatic cancer ([place])   Malnutrition of moderate degree   Hyponatremia -Patient was initially treated with IV fluid, then transition to IV albumin and Lasix.  However sodium continued to be low.  Started on tolvaptan, nephrology consulted -Added salt tabs, re-trial tolvaptan -No  improvement, sodium remains low at 122 this morning -Per Nephrology   Hypotension -Continue midodrine   Metastatic pancreatic adenocarcinoma -Appreciate oncology, palliative care medicine -Oncology planning for chemo holiday, follow-up as outpatient 10/29/2020 -PET 09/2020 showed hypermetabolic left retroperitoneal and para-aortic lesions also pancreatic lesion and omental disease and ascites. Patient had declined paracentesis and treated with spironolactone  Pancytopenia secondary to chemotherapy -S/p Granix -Improved  AKI on CKD stage IIIb -Baseline Cr 1.5 -Has been ranging 1.5-1.79 -Acutely worsened overnight creatinine 2.02 today  Failure to thrive, generalized weakness -Planning for residential versus home hospice  Acute urinary retention -Has required multiple in and out cath.  Foley was placed on 4/8    DVT prophylaxis: Lovenox on hold due to pancytopenia  Code Status: DNR Family Communication: No family at bedside Disposition Plan:  Status is: Inpatient  Remains inpatient appropriate because:Inpatient level of care appropriate due to severity of illness   Dispo: The patient is from: Home              Anticipated d/c is to: To be determined              Patient currently is not medically stable to d/c.  Awaiting family decision on residential versus home hospice   Difficult to place patient No      Consultants:   Palliative care medicine  Oncology  Nephrology     Antimicrobials:  Anti-infectives (From admission, onward)   None       Objective: Vitals:   10/20/20 2111 10/21/20 0028 10/21/20 0500 10/21/20 0805  BP: 139/73 136/79 (!) 142/74  134/79  Pulse: 77 74 73 76  Resp: 16 16 16 18   Temp: 98.6 F (37 C) 98.3 F (36.8 C) (!) 97.4 F (36.3 C) 97.7 F (36.5 C)  TempSrc: Oral  Oral   SpO2: 97% 98% 97% 98%  Weight:      Height:        Intake/Output Summary (Last 24 hours) at 10/21/2020 1033 Last data filed at 10/21/2020 0800 Gross per  24 hour  Intake 460 ml  Output 575 ml  Net -115 ml   Filed Weights   10/10/20 1058 10/10/20 1715  Weight: 63.5 kg 72.7 kg    Examination: General exam: Appears calm and comfortable, frail Respiratory system: Clear to auscultation. Respiratory effort normal.  On room air Cardiovascular system: S1 & S2 heard, RRR. No pedal edema. Gastrointestinal system: Abdomen is distended but soft, nontender to palpation Central nervous system: Alert, nonfocal exam Extremities: Symmetric in appearance bilaterally  Psychiatry: Stable.  Mood & affect appropriate.    Data Reviewed: I have personally reviewed following labs and imaging studies  CBC: Recent Labs  Lab 10/17/20 0548 10/18/20 0500 10/19/20 0537 10/20/20 0450 10/21/20 0540  WBC 8.0 6.7 5.6 7.2 9.9  HGB 8.0* 8.6* 9.1* 8.9* 9.2*  HCT 23.5* 24.7* 25.9* 26.5* 25.9*  MCV 88.7 87.9 87.2 89.2 88.1  PLT 45* 78* 140* 190 629   Basic Metabolic Panel: Recent Labs  Lab 10/17/20 0548 10/18/20 0500 10/19/20 0537 10/20/20 0450 10/20/20 1655 10/20/20 2340 10/21/20 0540 10/21/20 0806  NA 127* 122* 123* 122* 122* 122* 123* 122*  K 4.4 4.4 4.8 5.0  --   --  5.2*  --   CL 97* 93* 94* 92*  --   --  93*  --   CO2 21* 20* 19* 20*  --   --  19*  --   GLUCOSE 114* 111* 114* 126*  --   --  135*  --   BUN 56* 58* 58* 64*  --   --  73*  --   CREATININE 1.73* 1.68* 1.74* 1.88*  --   --  2.02*  --   CALCIUM 8.7* 8.6* 8.7* 8.5*  --   --  8.4*  --    GFR: Estimated Creatinine Clearance: 18.7 mL/min (A) (by C-G formula based on SCr of 2.02 mg/dL (H)). Liver Function Tests: Recent Labs  Lab 10/14/20 1047 10/21/20 0540  AST 38 67*  ALT 27 26  ALKPHOS 246* 258*  BILITOT 2.3* 2.5*  PROT 5.6* 5.4*  ALBUMIN 2.7* 2.4*   No results for input(s): LIPASE, AMYLASE in the last 168 hours. No results for input(s): AMMONIA in the last 168 hours. Coagulation Profile: No results for input(s): INR, PROTIME in the last 168 hours. Cardiac Enzymes: No  results for input(s): CKTOTAL, CKMB, CKMBINDEX, TROPONINI in the last 168 hours. BNP (last 3 results) No results for input(s): PROBNP in the last 8760 hours. HbA1C: No results for input(s): HGBA1C in the last 72 hours. CBG: No results for input(s): GLUCAP in the last 168 hours. Lipid Profile: No results for input(s): CHOL, HDL, LDLCALC, TRIG, CHOLHDL, LDLDIRECT in the last 72 hours. Thyroid Function Tests: No results for input(s): TSH, T4TOTAL, FREET4, T3FREE, THYROIDAB in the last 72 hours. Anemia Panel: No results for input(s): VITAMINB12, FOLATE, FERRITIN, TIBC, IRON, RETICCTPCT in the last 72 hours. Sepsis Labs: No results for input(s): PROCALCITON, LATICACIDVEN in the last 168 hours.  Recent Results (from the past 240 hour(s))  SARS CORONAVIRUS 2 (TAT 6-24  HRS) Nasopharyngeal     Status: None   Collection Time: 10/14/20  2:10 PM   Specimen: Nasopharyngeal  Result Value Ref Range Status   SARS Coronavirus 2 NEGATIVE NEGATIVE Final    Comment: (NOTE) SARS-CoV-2 target nucleic acids are NOT DETECTED.  The SARS-CoV-2 RNA is generally detectable in upper and lower respiratory specimens during the acute phase of infection. Negative results do not preclude SARS-CoV-2 infection, do not rule out co-infections with other pathogens, and should not be used as the sole basis for treatment or other patient management decisions. Negative results must be combined with clinical observations, patient history, and epidemiological information. The expected result is Negative.  Fact Sheet for Patients: SugarRoll.be  Fact Sheet for Healthcare Providers: https://www.woods-mathews.com/  This test is not yet approved or cleared by the [place] FDA and  has been authorized for detection and/or diagnosis of SARS-CoV-2 by FDA under an Emergency Use Authorization (EUA). This EUA will remain  in effect (meaning this test can be used) for the duration of  the COVID-19 declaration under Se ction 564(b)(1) of the Act, 21 U.S.C. section 360bbb-3(b)(1), unless the authorization is terminated or revoked sooner.  Performed at [facility], [place] [address] [place], [place] [address]       Radiology Studies: No results found.    Scheduled Meds: . (feeding supplement) PROSource Plus  30 mL Oral BID BM  . ascorbic acid  1,000 mg Oral Once per day on Mon Wed Fri  . calcium-vitamin D  1 tablet Oral Daily  . Chlorhexidine Gluconate Cloth  6 each Topical Daily  . midodrine  5 mg Oral TID WC  . multivitamin with minerals  1 tablet Oral Daily  . nystatin  5 mL Oral QID  . sodium chloride flush  10-40 mL Intracatheter Q12H  . sodium chloride  1 g Oral TID WC  . spironolactone  50 mg Oral Daily  . tolvaptan  15 mg Oral Q1200   Continuous Infusions: . methocarbamol (ROBAXIN) IV       LOS: 11 days      Time spent: 20 minutes   Dessa Phi, DO [facility] 10/21/2020, 10:33 AM   Available via Epic secure chat 7am-7pm After these hours, please refer to coverage provider listed on [email or web address]

## 2020-10-22 ENCOUNTER — Inpatient Hospital Stay: Payer: Medicare Other

## 2020-10-22 ENCOUNTER — Ambulatory Visit: Payer: Medicare Other | Admitting: Oncology

## 2020-10-22 ENCOUNTER — Other Ambulatory Visit: Payer: Medicare Other

## 2020-10-22 ENCOUNTER — Ambulatory Visit: Payer: Medicare Other

## 2020-10-22 LAB — CBC
HCT: 26.2 % — ABNORMAL LOW (ref 36.0–46.0)
Hemoglobin: 9 g/dL — ABNORMAL LOW (ref 12.0–15.0)
MCH: 30.7 pg (ref 26.0–34.0)
MCHC: 34.4 g/dL (ref 30.0–36.0)
MCV: 89.4 fL (ref 80.0–100.0)
Platelets: 273 10*3/uL (ref 150–400)
RBC: 2.93 MIL/uL — ABNORMAL LOW (ref 3.87–5.11)
RDW: 19.2 % — ABNORMAL HIGH (ref 11.5–15.5)
WBC: 14.4 10*3/uL — ABNORMAL HIGH (ref 4.0–10.5)
nRBC: 40.3 % — ABNORMAL HIGH (ref 0.0–0.2)

## 2020-10-22 LAB — BASIC METABOLIC PANEL
Anion gap: 11 (ref 5–15)
BUN: 79 mg/dL — ABNORMAL HIGH (ref 8–23)
CO2: 19 mmol/L — ABNORMAL LOW (ref 22–32)
Calcium: 8.4 mg/dL — ABNORMAL LOW (ref 8.9–10.3)
Chloride: 93 mmol/L — ABNORMAL LOW (ref 98–111)
Creatinine, Ser: 2.33 mg/dL — ABNORMAL HIGH (ref 0.44–1.00)
GFR, Estimated: 20 mL/min — ABNORMAL LOW (ref >60)
Glucose, Bld: 139 mg/dL — ABNORMAL HIGH (ref 70–99)
Potassium: 5.6 mmol/L — ABNORMAL HIGH (ref 3.5–5.1)
Sodium: 123 mmol/L — ABNORMAL LOW (ref 135–145)

## 2020-10-22 LAB — SODIUM: Sodium: 123 mmol/L — ABNORMAL LOW (ref 135–145)

## 2020-10-22 MED ORDER — MORPHINE SULFATE (CONCENTRATE) 20 MG/ML PO SOLN: 10.0000 mg | ORAL | 0 refills | Status: AC | PRN

## 2020-10-22 MED ORDER — MORPHINE SULFATE (CONCENTRATE) 20 MG/ML PO SOLN: 10.0000 mg | ORAL | 0 refills | Status: DC | PRN

## 2020-10-22 NOTE — TOC Transition Note (Signed)
Transition of Care [facility]) - CM/SW Discharge Note   Patient Details  Name: Veronica Colon MRN: [id number] Date of Birth: 09-23-1931  Transition of Care [facility]) CM/SW Contact:  Candie Chroman, LCSW Phone Number: 10/22/2020, 10:03 AM   Clinical Narrative:  Patient has orders to discharge home with hospice today. Per hospice liaison, DME will be delivered between 10:00-12:00. [facility] has been arranged for 2:00. Daughter is aware and confirmed address on facesheet is correct. No further concerns. CSW signing off.   Final next level of care: Home w Hospice Care Barriers to Discharge: Barriers Resolved   Patient Goals and CMS Choice     Choice offered to / list presented to : Adult Children  Discharge Placement                Patient to be transferred to facility by: [facility] Name of family member notified: Katharina Caper Patient and family notified of of transfer: 10/22/20  Discharge Plan and Services   Discharge Planning Services: CM Consult                                 Social Determinants of Health ([place]) Interventions     Readmission Risk Interventions Readmission Risk Prevention Plan 10/14/2020  Transportation Screening Complete  PCP or Specialist Appt within 3-5 Days Complete  HRI or [place] Complete  Social Work Consult for [place] Planning/Counseling Complete  Palliative Care Screening Not Applicable  Medication Review [employer]) Complete  Some recent data might be hidden

## 2020-10-22 NOTE — Progress Notes (Signed)
[place] Room [facility]) Hospital Liaison RN note:  Spoke with patient's daughter, Dina Rich, to confirm that DME will be delivered between 10-12 today. Hospital care team is aware. Transport can be arranged after that time. I will fax the discharge summary to her hospice team once it is available.  Please send signed and completed DNR with patient. Please provide prescriptions as needed to ensure ongoing symptom management.  Please call with any hospice related questions or concerns.  Thank you for the opportunity to participate in this patient's care.  Zandra Abts, RN [facility] Liaison [phone]

## 2020-10-22 NOTE — Progress Notes (Addendum)
Patient being discharged home with Hospice. Foley and right IJ left in place, no orders to remove and [facility] with Hospice made aware. Patient transport via EMS.

## 2020-10-22 NOTE — Hospital Course (Signed)
87 year old community dwelling black female Metastatic pancreatic CA/palliative chemo (gemcitabine) managed by Dr. Claudie Leach office visit 3/30 = decreasing performance status--has chronic omental disease with refractory ascites (patient has declined therapeutic paracentesis) [name] cancer stage I around 2015 06/23/2011 status post right lap chole Breast cancer 31 years prior Pancytopenia Chronic hyponatremia Mild renal insufficiency?  Left hydronephrosis  Admit for 07/31/2020 profound deconditioning-intolerance ADL/IADL-palliative care consulted by ED, heme-onc consulted Mild AKI on admission Profound hyponatremia Rx tolvaptan with persistent sodium in the 120 range

## 2020-10-22 NOTE — Progress Notes (Signed)
[facility]  PROGRESS NOTE   Subjective:   Patient seen resting in bed Awake and alert No complaints at this time   Objective:  Vital signs in last 24 hours:  Temp:  [97.5 F (36.4 C)-99 F (37.2 C)] 98.4 F (36.9 C) (04/10 0814) Pulse Rate:  [72-77] 77 (04/10 0814) Resp:  [14-18] 18 (04/10 0814) BP: (130-135)/(69-72) 133/70 (04/10 0814) SpO2:  [96 %-98 %] 98 % (04/10 0814)  Weight change:  Filed Weights   10/10/20 1058 10/10/20 1715  Weight: 63.5 kg 72.7 kg    Intake/Output: I/O last 3 completed shifts: In: 65 [P.O.:420] Out: 900 [Urine:900]   Intake/Output this shift:  Total I/O In: 240 [P.O.:240] Out: -   Physical Exam: General:  No acute distress  Head:  Normocephalic, atraumatic. Moist oral mucosal membranes  Eyes:  Anicteric  Lungs:   Clear to auscultation, normal effort  Heart:  S1S2 no rubs  Abdomen:   Soft, nontender, bowel sounds present, mild distention  Extremities:  + peripheral edema.  Neurologic:  Awake, alert, following commands  Skin:  No lesions  GU Foley    Basic Metabolic Panel: Recent Labs  Lab 10/14/20 0754 10/14/20 1047 10/15/20 0803 10/16/20 0430 10/17/20 0548 10/18/20 0500 10/19/20 0537  NA 124*   < > 126* 126* 127* 122* 123*  K 4.2   < > 4.4 4.4 4.4 4.4 4.8  CL 96*   < > 96* 95* 97* 93* 94*  CO2 18*   < > 19* 21* 21* 20* 19*  GLUCOSE 99   < > 101* 120* 114* 111* 114*  BUN 50*   < > 56* 57* 56* 58* 58*  CREATININE 1.71*   < > 1.79* 1.71* 1.73* 1.68* 1.74*  CALCIUM 8.7*   < > 9.1 8.7* 8.7* 8.6* 8.7*  MG 1.9  --   --   --   --   --   --    < > = values in this interval not displayed.    Liver Function Tests: Recent Labs  Lab 10/14/20 1047  AST 38  ALT 27  ALKPHOS 246*  BILITOT 2.3*  PROT 5.6*  ALBUMIN 2.7*   No results for input(s): LIPASE, AMYLASE in the last 168 hours. No results for input(s): AMMONIA in the last 168 hours.  CBC: Recent Labs  Lab 10/15/20 0144 10/16/20 0430 10/17/20 0548  10/18/20 0500 10/19/20 0537  WBC 3.3* 7.1 8.0 6.7 5.6  HGB 8.4* 8.3* 8.0* 8.6* 9.1*  HCT 24.5* 23.5* 23.5* 24.7* 25.9*  MCV 88.8 87.4 88.7 87.9 87.2  PLT 53* 37* 45* 78* 140*    Cardiac Enzymes: No results for input(s): CKTOTAL, CKMB, CKMBINDEX, TROPONINI in the last 168 hours.  BNP: Invalid input(s): POCBNP  CBG: Recent Labs  Lab 10/21/20 2033  GLUCAP 157*    Microbiology: Results for orders placed or performed during the hospital encounter of 10/10/20  Resp Panel by RT-PCR (Flu A&B, Covid) Nasopharyngeal Swab     Status: None   Collection Time: 10/10/20  2:07 PM   Specimen: Nasopharyngeal Swab; Nasopharyngeal(NP) swabs in vial transport medium  Result Value Ref Range Status   SARS Coronavirus 2 by RT PCR NEGATIVE NEGATIVE Final    Comment: (NOTE) SARS-CoV-2 target nucleic acids are NOT DETECTED.  The SARS-CoV-2 RNA is generally detectable in upper respiratory specimens during the acute phase of infection. The lowest concentration of SARS-CoV-2 viral copies this assay can detect is 138 copies/mL. A negative result does not preclude  SARS-Cov-2 infection and should not be used as the sole basis for treatment or other patient management decisions. A negative result may occur with  improper specimen collection/handling, submission of specimen other than nasopharyngeal swab, presence of viral mutation(s) within the areas targeted by this assay, and inadequate number of viral copies(<138 copies/mL). A negative result must be combined with clinical observations, patient history, and epidemiological information. The expected result is Negative.  Fact Sheet for Patients:  EntrepreneurPulse.com.au  Fact Sheet for Healthcare Providers:  IncredibleEmployment.be  This test is no t yet approved or cleared by the [place] FDA and  has been authorized for detection and/or diagnosis of SARS-CoV-2 by FDA under an Emergency Use Authorization  (EUA). This EUA will remain  in effect (meaning this test can be used) for the duration of the COVID-19 declaration under Section 564(b)(1) of the Act, 21 U.S.C.section 360bbb-3(b)(1), unless the authorization is terminated  or revoked sooner.       Influenza A by PCR NEGATIVE NEGATIVE Final   Influenza B by PCR NEGATIVE NEGATIVE Final    Comment: (NOTE) The Xpert Xpress SARS-CoV-2/FLU/RSV plus assay is intended as an aid in the diagnosis of influenza from Nasopharyngeal swab specimens and should not be used as a sole basis for treatment. Nasal washings and aspirates are unacceptable for Xpert Xpress SARS-CoV-2/FLU/RSV testing.  Fact Sheet for Patients: EntrepreneurPulse.com.au  Fact Sheet for Healthcare Providers: IncredibleEmployment.be  This test is not yet approved or cleared by the [place] FDA and has been authorized for detection and/or diagnosis of SARS-CoV-2 by FDA under an Emergency Use Authorization (EUA). This EUA will remain in effect (meaning this test can be used) for the duration of the COVID-19 declaration under Section 564(b)(1) of the Act, 21 U.S.C. section 360bbb-3(b)(1), unless the authorization is terminated or revoked.  Performed at [facility], [place], [place] [address]   SARS CORONAVIRUS 2 (TAT 6-24 HRS) Nasopharyngeal     Status: None   Collection Time: 10/14/20  2:10 PM   Specimen: Nasopharyngeal  Result Value Ref Range Status   SARS Coronavirus 2 NEGATIVE NEGATIVE Final    Comment: (NOTE) SARS-CoV-2 target nucleic acids are NOT DETECTED.  The SARS-CoV-2 RNA is generally detectable in upper and lower respiratory specimens during the acute phase of infection. Negative results do not preclude SARS-CoV-2 infection, do not rule out co-infections with other pathogens, and should not be used as the sole basis for treatment or other patient management decisions. Negative results must  be combined with clinical observations, patient history, and epidemiological information. The expected result is Negative.  Fact Sheet for Patients: SugarRoll.be  Fact Sheet for Healthcare Providers: https://www.woods-mathews.com/  This test is not yet approved or cleared by the [place] FDA and  has been authorized for detection and/or diagnosis of SARS-CoV-2 by FDA under an Emergency Use Authorization (EUA). This EUA will remain  in effect (meaning this test can be used) for the duration of the COVID-19 declaration under Se ction 564(b)(1) of the Act, 21 U.S.C. section 360bbb-3(b)(1), unless the authorization is terminated or revoked sooner.  Performed at [facility], [place] [address], [place], [place] [address]     Coagulation Studies: No results for input(s): LABPROT, INR in the last 72 hours.  Urinalysis: No results for input(s): COLORURINE, LABSPEC, PHURINE, GLUCOSEU, HGBUR, BILIRUBINUR, KETONESUR, PROTEINUR, UROBILINOGEN, NITRITE, LEUKOCYTESUR in the last 72 hours.  Invalid input(s): APPERANCEUR    Imaging: No results found.   Medications:   .  methocarbamol (ROBAXIN) IV     . (feeding supplement) PROSource Plus  30 mL Oral BID BM  . ascorbic acid  1,000 mg Oral Once per day on Mon Wed Fri  . calcium-vitamin D  1 tablet Oral Daily  . Chlorhexidine Gluconate Cloth  6 each Topical Daily  . midodrine  5 mg Oral TID WC  . multivitamin with minerals  1 tablet Oral Daily  . nystatin  5 mL Oral QID  . sodium chloride flush  10-40 mL Intracatheter Q12H  . spironolactone  50 mg Oral Daily    Assessment/ Plan:     Active Problems:   H/O malignant neoplasm of breast   H/O malignant neoplasm of colon   CKD (chronic kidney disease), stage III (HCC)   Temporal arteritis (HCC)   Type 2 diabetes mellitus with obesity (HCC)   Monoclonal gammopathy of unknown significance (MGUS)   Primary pancreatic cancer with  metastasis to other site [facility])   Dehydration   Generalized weakness   Metastatic adenocarcinoma to pancreas ([place])   Acute kidney injury superimposed on CKD (HCC)   Hyponatremia   Anemia associated with chemotherapy   Thrombocytopenia (HCC)   Hydronephrosis, left   Impaired fasting glucose   Pancytopenia ([place])   Thrush   Palliative care encounter   Metastasis from pancreatic cancer ([place])   Malnutrition of moderate degree  87 y.o. female with history of metastatic pancreatic carcinoma, history of breast and colon cancer, diabetes, MGUS, chronic kidney disease now admitted with history of failure to thrive.  As per patient's daughter her general status has declined since initiation of the chemotherapy.  1. Hyponatremia Continues to be 123 Current treatments hindered by lack of oral nutrition Discontinue treatments Patient and family have decided to discharge home with hospice  2.Acute Kidney injury on chronic kidney disease stage 3b with baseline creatinine 1.51/EGFR 36 on 03/21/20 Secondary to chemo induced loss of appetite Continues to worsen in setting poor nutrition  3. Diabetes melitis with CKD - Glucose controlled    LOS: [facility] 4/13/202212:07 PM

## 2020-10-22 NOTE — Progress Notes (Signed)
Occupational Therapy Treatment Patient Details Name: Veronica Colon MRN: [id number] DOB: January 28, 1932 Today's Date: 10/22/2020    History of present illness Pt is an 87 y.o. female with hx of metastatic pancreatic cancer currently undergoing palliative chemotherapy, history of breast and colon cancer in remission, type 2 diabetes, MGUS, CKD, who presents with profound weakness.  MD assessment includes: physical deconditioning, metastatic pancreatic cancer, AKI, elevated troponin, hyponatremia, and anemia.   OT comments  Veronica Colon was seen for OT treatment on this date with daughter present t/o session. Upon arrival to room pt reclined in bed, agreeable to tx. Pt and family given HEP and demonstrated supine exercises. Pt required MAX A toileting at bed level - DTR educated on proper body mechanics for rolling and toileting bed level including pericare and skin protection. Family return demonstrated of instruction provided. RN and NT in room end of session to perform foley care. Pt making good progress toward goals. Pt continues to benefit from skilled OT services to maximize return to PLOF and minimize risk of future falls, injury, caregiver burden, and readmission. Will continue to follow POC. Discharge recommendation remains appropriate.    Follow Up Recommendations  SNF    Equipment Recommendations  3 in 1 bedside commode    Recommendations for Other Services      Precautions / Restrictions Precautions Precautions: Fall Precaution Comments: R chest port Restrictions Weight Bearing Restrictions: No       Mobility Bed Mobility Overal bed mobility: Needs Assistance Bed Mobility: Rolling Rolling: Min assist         General bed mobility comments: maintained sidelying with CGA from DTR for ~10 mins                         ADL either performed or assessed with clinical judgement   ADL Overall ADL's : Needs assistance/impaired                                        General ADL Comments: MAX A toileting at bed level - DTR educated on proper body mechanics for rolling and toileting bed level including pericare, skin protection, HEP, and bathing               Cognition Arousal/Alertness: Awake/alert Behavior During Therapy: Flat affect Overall Cognitive Status: Within Functional Limits for tasks assessed                                 General Comments: smiles that she is going home today        Exercises Exercises: Other exercises Other Exercises Other Exercises: Pt and family educated re: DME recs, d/c recs, falls prevention, HEP (handout and theraband provided), skin protection Other Exercises: Toileting, L+R rolling           Pertinent Vitals/ Pain       Pain Assessment: No/denies pain         Frequency  Min 1X/week        Progress Toward Goals  OT Goals(current goals can now be found in the care plan section)  Progress towards OT goals: Progressing toward goals  Acute Rehab OT Goals Patient Stated Goal: to get stronger and do more for myself OT Goal Formulation: With patient Time For Goal Achievement: 10/25/20 Potential to Achieve Goals: Fair ADL Goals  Pt Will Perform Lower Body Dressing: with set-up;with min assist Pt Will Transfer to Toilet: with min guard assist Additional ADL Goal #1: Pt to increase bilateral UE strength by 1 mm grade to assist with completing ADL tasks  Plan Discharge plan remains appropriate;Frequency remains appropriate       AM-PAC OT "6 Clicks" Daily Activity     Outcome Measure   Help from another person eating meals?: None Help from another person taking care of personal grooming?: A Little Help from another person toileting, which includes using toliet, bedpan, or urinal?: A Lot Help from another person bathing (including washing, rinsing, drying)?: A Lot Help from another person to put on and taking off regular upper body clothing?: A Little Help from another  person to put on and taking off regular lower body clothing?: A Lot 6 Click Score: 16    End of Session    OT Visit Diagnosis: Unsteadiness on feet (R26.81);Muscle weakness (generalized) (M62.81)   Activity Tolerance Patient tolerated treatment well   Patient Left in bed;with call bell/phone within reach;with nursing/sitter in room;with family/visitor present   Nurse Communication Mobility status        Time: 8832-5498 OT Time Calculation (min): 30 min  Charges: OT General Charges $OT Visit: 1 Visit OT Treatments $Self Care/Home Management : 23-37 mins  Dessie Coma, M.S. OTR/L  10/22/20, 11:17 AM  ascom [phone]

## 2020-10-22 NOTE — Discharge Summary (Signed)
Physician Discharge Summary  Veronica Colon VZC:[id number] DOB: 05-18-1932 DOA: 10/10/2020  PCP: Delsa Grana, PA-C  Admit date: 10/10/2020 Discharge date: 10/22/2020  Time spent: 45 minutes  Recommendations for Outpatient Follow-up:  1. Patient being discharged to the care of Hospice at home 2. Hard Rx Roxanol, DNR signed  Discharge Diagnoses:  MAIN problem for hospitalization   Severe hyponatremia  Please see below for itemized issues addressed in HOpsital- refer to other progress notes for clarity if needed  Discharge Condition: gaurded  Diet recommendation: comfort  Filed Weights   10/10/20 1058 10/10/20 1715  Weight: 63.5 kg 72.7 kg    History of present illness:  87 year old community dwelling black female Metastatic pancreatic CA/palliative chemo (gemcitabine) managed by Dr. Claudie Leach office visit 3/30 = decreasing performance status--has chronic omental disease with refractory ascites (patient has declined therapeutic paracentesis) [name] cancer stage I around 2015 06/23/2011 status post right lap chole Breast cancer 31 years prior Pancytopenia Chronic hyponatremia Mild renal insufficiency?  Left hydronephrosis  Admit for 07/31/2020 profound deconditioning-intolerance ADL/IADL-palliative care consulted by ED, heme-onc consulted Mild AKI on admission Profound hyponatremia Rx tolvaptan with persistent sodium in the 120 range  Hospital Course:  Initial attempts made to control hyponatremia with IVF, then IV albumin-salt tabs and tolvaptan--this proved futile, appreciate nephro input. Initially placed on midodrine for hypotension --stopped on d/c Long term indwelling foley place dprior to d/c  Will d/c with services of [facility]--DME being delivered--met with daughter at bedside and patient and answered questions elicited   Discharge Exam: Vitals:   10/22/20 0421 10/22/20 0843  BP: 131/70 (!) 143/69  Pulse: 77 78  Resp: 16 18  Temp:  (!) 97.5 F (36.4 C) 99 F (37.2 C)  SpO2: 98% 100%    Subj on day of d/c   Awake quiet coherent--knows she is at [facility]-correctly identifies her daughter cta b s1 s2 no m Abd soft Extremities warm  Discharge Instructions    Allergies as of 10/22/2020      Reactions   Hydrochlorothiazide Other (See Comments)   Hypokalemia   Lisinopril Other (See Comments), Cough   Hands numb   Metformin And Related    Fatigue, "heart was beating slower"      Medication List    STOP taking these medications   acetaminophen 325 MG tablet Commonly known as: TYLENOL   ascorbic acid 1000 MG tablet Commonly known as: VITAMIN C   aspirin 81 MG tablet   multivitamin tablet   Os-Cal Calcium + D3 500-200 MG-UNIT Tabs Generic drug: Calcium Carb-Cholecalciferol   spironolactone 50 MG tablet Commonly known as: ALDACTONE     TAKE these medications   lidocaine-prilocaine cream Commonly known as: EMLA Apply to port 1 hour prior to appointment. Cover with plastic wrap.   morphine 20 MG/ML concentrated solution Commonly known as: ROXANOL Take 0.5 mLs (10 mg total) by mouth every 4 (four) hours as needed for up to 3 days for severe pain.   prochlorperazine 10 MG tablet Commonly known as: COMPAZINE Take 1 tablet (10 mg total) by mouth every 6 (six) hours as needed (Nausea or vomiting).      Allergies  Allergen Reactions  . Hydrochlorothiazide Other (See Comments)    Hypokalemia   . Lisinopril Other (See Comments) and Cough    Hands numb   . Metformin And Related     Fatigue, "heart was beating slower"    Contact information for after-discharge care    Destination  [facility] Preferred SNF .   Service: Skilled Nursing Contact information: [address] [place] [phone]                   The results of significant diagnostics from this hospitalization (including imaging, microbiology, ancillary and laboratory) are  listed below for reference.    Significant Diagnostic Studies: NM PET Image Restag (PS) Skull Base To Thigh  Result Date: 09/24/2020 CLINICAL DATA:  Aortic Atherosclerois (ICD10-170.0) treatment strategy for pancreatic cancer. EXAM: NUCLEAR MEDICINE PET SKULL BASE TO THIGH TECHNIQUE: 8.1 mCi F-18 FDG was injected intravenously. Full-ring PET imaging was performed from the skull base to thigh after the radiotracer. CT data was obtained and used for attenuation correction and anatomic localization. Fasting blood glucose: 111 mg/dl COMPARISON:  04/17/2020. FINDINGS: Mediastinal blood pool activity: SUV max 3.1 Liver activity: SUV max NA NECK: No hypermetabolic lymph nodes in the neck. Incidental CT findings: none CHEST: No hypermetabolic mediastinal or hilar nodes. No suspicious pulmonary nodules on the CT scan. Incidental CT findings: Coronary artery calcification is evident. Atherosclerotic calcification is noted in the wall of the thoracic aorta. Right Port-A-Cath tip is positioned at the SVC/RA junction. ABDOMEN/PELVIS: Hypermetabolism seen in the pancreatic lesion previously has decreased slightly in the interval with SUV max = 5.2 today compared to 7.7 previously. The left para-aortic retroperitoneal lesion measures 1.7 x 1.6 cm today and shows low level hypermetabolism with SUV max = 3.8. This is lesion that measured 2.6 x 1.3 cm on previous PET-CT with SUV max = 6.6 and represents the site of left ureteral obstruction. Omental disease seen previously has decreased on CT imaging. Low level FDG accumulation identified in a 2.5 cm left paramidline omental lesion (165/3) with SUV max = 4.0 1.7 cm small midline omental lesion just deep to the umbilicus on 174/9 demonstrates SUV max = 3.8. Incidental CT findings: Left hydronephrosis is similar to prior. Moderate volume ascites is new in the interval. There is abdominal aortic atherosclerosis without aneurysm. Gallbladder is distended SKELETON: No focal  hypermetabolic activity to suggest skeletal metastasis. Incidental CT findings: No worrisome lytic or sclerotic osseous abnormality. IMPRESSION: 1. Interval slight decrease in hypermetabolism associated with the patient's known pancreatic lesion. Main duct dilatation in the tail of pancreas is similar to prior. 2. Hypermetabolic left retroperitoneal/para-aortic lesion measures slightly smaller today with associated interval decrease in hypermetabolic FDG accumulation. This lesion obstructs the left ureter with similar appearance of left hydroureteronephrosis. 3. Omental disease identified on the previous study has decreased in the interval but persists. 4. Despite the generalized trend towards improvement on today's study, there is new moderate volume ascites in the abdomen and pelvis. Electronically Signed   By: Misty Stanley M.D.   On: 09/24/2020 15:40   DG Chest Portable 1 View  Result Date: 10/10/2020 CLINICAL DATA:  Weakness. EXAM: PORTABLE CHEST 1 VIEW COMPARISON:  August 07, 2018. FINDINGS: The heart size and mediastinal contours are within normal limits. No pneumothorax or significant pleural effusion is noted. Right internal jugular Port-A-Cath is noted with distal tip in expected position of cavoatrial junction. Hypoinflation of the lungs is noted with minimal bibasilar subsegmental atelectasis. The visualized skeletal structures are unremarkable. IMPRESSION: Hypoinflation of the lungs with minimal bibasilar subsegmental atelectasis. Electronically Signed   By: Marijo Conception M.D.   On: 10/10/2020 13:10    Microbiology: Recent Results (from the past 240 hour(s))  SARS CORONAVIRUS 2 (TAT 6-24 HRS) Nasopharyngeal  Status: None   Collection Time: 10/14/20  2:10 PM   Specimen: Nasopharyngeal  Result Value Ref Range Status   SARS Coronavirus 2 NEGATIVE NEGATIVE Final    Comment: (NOTE) SARS-CoV-2 target nucleic acids are NOT DETECTED.  The SARS-CoV-2 RNA is generally detectable in upper and  lower respiratory specimens during the acute phase of infection. Negative results do not preclude SARS-CoV-2 infection, do not rule out co-infections with other pathogens, and should not be used as the sole basis for treatment or other patient management decisions. Negative results must be combined with clinical observations, patient history, and epidemiological information. The expected result is Negative.  Fact Sheet for Patients: SugarRoll.be  Fact Sheet for Healthcare Providers: https://www.woods-mathews.com/  This test is not yet approved or cleared by the [place] FDA and  has been authorized for detection and/or diagnosis of SARS-CoV-2 by FDA under an Emergency Use Authorization (EUA). This EUA will remain  in effect (meaning this test can be used) for the duration of the COVID-19 declaration under Se ction 564(b)(1) of the Act, 21 U.S.C. section 360bbb-3(b)(1), unless the authorization is terminated or revoked sooner.  Performed at [facility], [place] [address] [place], [place] [address]      Labs: Basic Metabolic Panel: Recent Labs  Lab 10/18/20 0500 10/19/20 0537 10/20/20 0450 10/20/20 1655 10/21/20 0540 10/21/20 0806 10/21/20 1553 10/22/20 0213 10/22/20 0555  NA 122* 123* 122*   < > 123* 122* 122* 123* 123*  K 4.4 4.8 5.0  --  5.2*  --   --   --  5.6*  CL 93* 94* 92*  --  93*  --   --   --  93*  CO2 20* 19* 20*  --  19*  --   --   --  19*  GLUCOSE 111* 114* 126*  --  135*  --   --   --  139*  BUN 58* 58* 64*  --  73*  --   --   --  79*  CREATININE 1.68* 1.74* 1.88*  --  2.02*  --   --   --  2.33*  CALCIUM 8.6* 8.7* 8.5*  --  8.4*  --   --   --  8.4*   < > = values in this interval not displayed.   Liver Function Tests: Recent Labs  Lab 10/21/20 0540  AST 67*  ALT 26  ALKPHOS 258*  BILITOT 2.5*  PROT 5.4*  ALBUMIN 2.4*   No results for input(s): LIPASE, AMYLASE in the last 168 hours. No results  for input(s): AMMONIA in the last 168 hours. CBC: Recent Labs  Lab 10/18/20 0500 10/19/20 0537 10/20/20 0450 10/21/20 0540 10/22/20 0555  WBC 6.7 5.6 7.2 9.9 14.4*  HGB 8.6* 9.1* 8.9* 9.2* 9.0*  HCT 24.7* 25.9* 26.5* 25.9* 26.2*  MCV 87.9 87.2 89.2 88.1 89.4  PLT 78* 140* 190 242 273   Cardiac Enzymes: No results for input(s): CKTOTAL, CKMB, CKMBINDEX, TROPONINI in the last 168 hours. BNP: BNP (last 3 results) No results for input(s): BNP in the last 8760 hours.  ProBNP (last 3 results) No results for input(s): PROBNP in the last 8760 hours.  CBG: Recent Labs  Lab 10/21/20 2033  GLUCAP 157*       Signed:  Nita Sells MD   [facility] 10/22/2020, 9:48 AM

## 2020-10-29 ENCOUNTER — Other Ambulatory Visit: Payer: Medicare Other

## 2020-10-29 ENCOUNTER — Ambulatory Visit: Payer: Medicare Other | Admitting: Oncology

## 2020-10-29 ENCOUNTER — Inpatient Hospital Stay: Payer: Medicare Other

## 2020-10-29 ENCOUNTER — Ambulatory Visit: Payer: Medicare Other

## 2020-10-29 ENCOUNTER — Inpatient Hospital Stay: Payer: Medicare Other | Admitting: Oncology

## 2020-11-09 DEATH — deceased

## 2021-02-10 ENCOUNTER — Ambulatory Visit: Payer: Medicare Other

## 2022-02-08 IMAGING — CT NM PET TUM IMG RESTAG (PS) SKULL BASE T - THIGH
1 of 9 series · 1 of 25 positions shown · non-contrast
Comparison: 04/17/2020.

CLINICAL DATA: Aortic Atherosclerois ([id number]-170.0) treatment
strategy for pancreatic cancer.

EXAM:
NUCLEAR MEDICINE PET SKULL BASE TO THIGH
TECHNIQUE: 8.1 mCi F-18 FDG was injected intravenously. Full-ring PET imaging
was performed from the skull base to thigh after the radiotracer. CT
data was obtained and used for attenuation correction and anatomic
localization.
Fasting blood glucose: 111 mg/dl

[Series 3: ct wb 5.0 b30f · axial · 5.0mm · 0.98mm/px · 1 of 290 slices shown]
[im 290/290  brain]
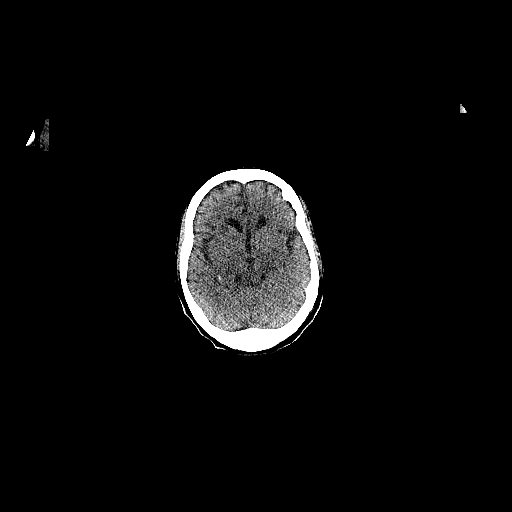

[1 of 25 positions shown; findings below may reference images not displayed]

FINDINGS: Mediastinal blood pool activity: SUV max

Liver activity: SUV max NA

NECK: No hypermetabolic lymph nodes in the neck.

Incidental CT findings: none

CHEST: No hypermetabolic mediastinal or hilar nodes. No suspicious
pulmonary nodules on the CT scan.

Incidental CT findings: Coronary artery calcification is evident.
Atherosclerotic calcification is noted in the wall of the thoracic
aorta. Right Port-A-Cath tip is positioned at the SVC/RA junction.

ABDOMEN/PELVIS: Hypermetabolism seen in the pancreatic lesion
previously has decreased slightly in the interval with SUV max =
today compared to 7.7 previously.

The left para-aortic retroperitoneal lesion measures 1.7 x 1.6 cm
today and shows low level hypermetabolism with SUV max = 3.8. This
is lesion that measured 2.6 x 1.3 cm on previous PET-CT with SUV max
= 6.6 and represents the site of left ureteral obstruction.

Omental disease seen previously has decreased on CT imaging. Low
level FDG accumulation identified in a 2.5 cm left paramidline
omental lesion (165/3) with SUV max = 4.0 1.7 cm small midline
omental lesion just deep to the umbilicus on 172/3 demonstrates SUV
max = 3.8.

Incidental CT findings: Left hydronephrosis is similar to prior.
Moderate volume ascites is new in the interval. There is abdominal
aortic atherosclerosis without aneurysm. Gallbladder is distended

SKELETON: No focal hypermetabolic activity to suggest skeletal
metastasis.

Incidental CT findings: No worrisome lytic or sclerotic osseous
abnormality.
IMPRESSION: 1. Interval slight decrease in hypermetabolism associated with the
patient's known pancreatic lesion. Main duct dilatation in the tail
of pancreas is similar to prior.
2. Hypermetabolic left retroperitoneal/para-aortic lesion measures
slightly smaller today with associated interval decrease in
hypermetabolic FDG accumulation. This lesion obstructs the left
ureter with similar appearance of left hydroureteronephrosis.
3. Omental disease identified on the previous study has decreased in
the interval but persists.
4. Despite the generalized trend towards improvement on today's
study, there is new moderate volume ascites in the abdomen and
pelvis.

## 2022-02-24 IMAGING — DX DG CHEST 1V PORT
1 series · 1 of 1 positions shown · non-contrast
Comparison: August 07, 2018.

CLINICAL DATA: Weakness.

EXAM:
PORTABLE CHEST 1 VIEW

[chest ap]
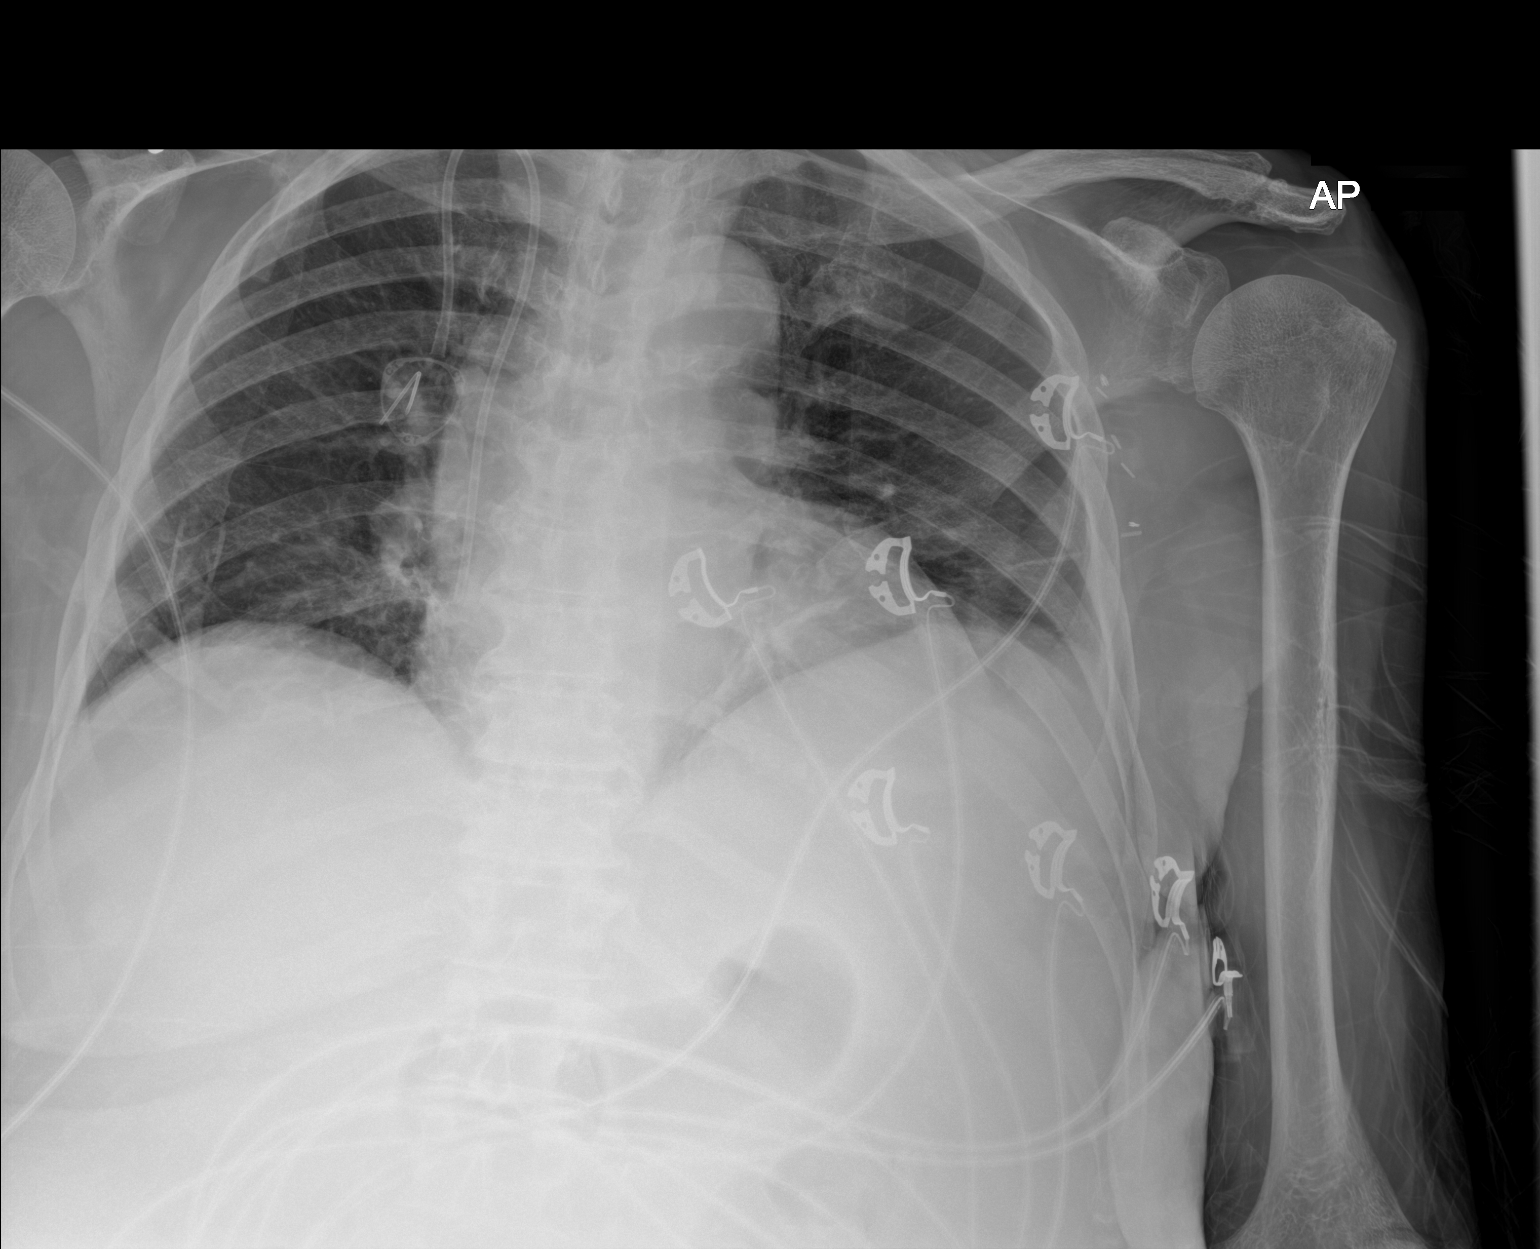

[1 of 1 positions shown; findings below may reference images not displayed]

FINDINGS: The heart size and mediastinal contours are within normal limits. No
pneumothorax or significant pleural effusion is noted. Right
internal jugular Port-A-Cath is noted with distal tip in expected
position of cavoatrial junction. Hypoinflation of the lungs is noted
with minimal bibasilar subsegmental atelectasis. The visualized
skeletal structures are unremarkable.
IMPRESSION: Hypoinflation of the lungs with minimal bibasilar subsegmental
atelectasis.
